# Patient Record
Sex: Male | Born: 1945
Health system: Southern US, Community
[De-identification: ages and names within clinical notes are randomized; demographics above are authoritative.]

## PROBLEM LIST (undated history)

## (undated) DIAGNOSIS — M1990 Unspecified osteoarthritis, unspecified site: Secondary | ICD-10-CM

## (undated) DIAGNOSIS — M79643 Pain in unspecified hand: Secondary | ICD-10-CM

## (undated) DIAGNOSIS — R319 Hematuria, unspecified: Secondary | ICD-10-CM

## (undated) DIAGNOSIS — F32A Depression, unspecified: Secondary | ICD-10-CM

## (undated) DIAGNOSIS — I7121 Aneurysm of the ascending aorta, without rupture: Secondary | ICD-10-CM

## (undated) DIAGNOSIS — R000 Tachycardia, unspecified: Secondary | ICD-10-CM

## (undated) DIAGNOSIS — I499 Cardiac arrhythmia, unspecified: Secondary | ICD-10-CM

## (undated) DIAGNOSIS — G4730 Sleep apnea, unspecified: Secondary | ICD-10-CM

## (undated) DIAGNOSIS — K219 Gastro-esophageal reflux disease without esophagitis: Secondary | ICD-10-CM

## (undated) DIAGNOSIS — T4145XA Adverse effect of unspecified anesthetic, initial encounter: Secondary | ICD-10-CM

## (undated) DIAGNOSIS — F329 Major depressive disorder, single episode, unspecified: Secondary | ICD-10-CM

## (undated) DIAGNOSIS — T8859XA Other complications of anesthesia, initial encounter: Secondary | ICD-10-CM

## (undated) DIAGNOSIS — I10 Essential (primary) hypertension: Secondary | ICD-10-CM

## (undated) DIAGNOSIS — I2510 Atherosclerotic heart disease of native coronary artery without angina pectoris: Secondary | ICD-10-CM

## (undated) DIAGNOSIS — N189 Chronic kidney disease, unspecified: Secondary | ICD-10-CM

## (undated) HISTORY — DX: Unspecified osteoarthritis, unspecified site: M19.90

## (undated) HISTORY — PX: REPLACEMENT TOTAL KNEE: SUR1224

## (undated) HISTORY — DX: Major depressive disorder, single episode, unspecified: F32.9

## (undated) HISTORY — PX: JOINT REPLACEMENT: SHX530

## (undated) HISTORY — PX: CATARACT EXTRACTION, BILATERAL: SHX1313

## (undated) HISTORY — DX: Chronic kidney disease, unspecified: N18.9

## (undated) HISTORY — DX: Essential (primary) hypertension: I10

## (undated) HISTORY — DX: Depression, unspecified: F32.A

## (undated) HISTORY — DX: Hematuria, unspecified: R31.9

---

## 2009-09-27 HISTORY — PX: REPLACEMENT TOTAL KNEE: SUR1224

## 2010-05-13 DIAGNOSIS — N189 Chronic kidney disease, unspecified: Secondary | ICD-10-CM | POA: Insufficient documentation

## 2010-09-27 HISTORY — PX: HIP SURGERY: SHX245

## 2011-03-01 DIAGNOSIS — M1710 Unilateral primary osteoarthritis, unspecified knee: Secondary | ICD-10-CM | POA: Insufficient documentation

## 2011-03-01 DIAGNOSIS — Z96649 Presence of unspecified artificial hip joint: Secondary | ICD-10-CM | POA: Insufficient documentation

## 2011-10-05 DIAGNOSIS — Z96659 Presence of unspecified artificial knee joint: Secondary | ICD-10-CM | POA: Insufficient documentation

## 2012-11-14 DIAGNOSIS — Z96659 Presence of unspecified artificial knee joint: Secondary | ICD-10-CM | POA: Insufficient documentation

## 2013-10-13 DIAGNOSIS — S61209A Unspecified open wound of unspecified finger without damage to nail, initial encounter: Secondary | ICD-10-CM | POA: Diagnosis not present

## 2013-12-07 DIAGNOSIS — M25519 Pain in unspecified shoulder: Secondary | ICD-10-CM | POA: Diagnosis not present

## 2014-01-10 DIAGNOSIS — M75101 Unspecified rotator cuff tear or rupture of right shoulder, not specified as traumatic: Secondary | ICD-10-CM

## 2014-01-10 DIAGNOSIS — M19019 Primary osteoarthritis, unspecified shoulder: Secondary | ICD-10-CM | POA: Diagnosis not present

## 2014-01-10 DIAGNOSIS — M249 Joint derangement, unspecified: Secondary | ICD-10-CM | POA: Diagnosis not present

## 2014-01-10 DIAGNOSIS — M12811 Other specific arthropathies, not elsewhere classified, right shoulder: Secondary | ICD-10-CM | POA: Insufficient documentation

## 2014-02-26 DIAGNOSIS — L821 Other seborrheic keratosis: Secondary | ICD-10-CM | POA: Insufficient documentation

## 2014-02-26 DIAGNOSIS — L738 Other specified follicular disorders: Secondary | ICD-10-CM | POA: Diagnosis not present

## 2014-03-08 DIAGNOSIS — N189 Chronic kidney disease, unspecified: Secondary | ICD-10-CM | POA: Diagnosis not present

## 2014-03-08 DIAGNOSIS — M109 Gout, unspecified: Secondary | ICD-10-CM | POA: Diagnosis not present

## 2014-04-08 DIAGNOSIS — B078 Other viral warts: Secondary | ICD-10-CM | POA: Diagnosis not present

## 2014-04-08 DIAGNOSIS — B079 Viral wart, unspecified: Secondary | ICD-10-CM | POA: Diagnosis not present

## 2014-04-08 DIAGNOSIS — D492 Neoplasm of unspecified behavior of bone, soft tissue, and skin: Secondary | ICD-10-CM | POA: Diagnosis not present

## 2014-04-08 DIAGNOSIS — Z85828 Personal history of other malignant neoplasm of skin: Secondary | ICD-10-CM | POA: Diagnosis not present

## 2014-05-01 DIAGNOSIS — H52229 Regular astigmatism, unspecified eye: Secondary | ICD-10-CM | POA: Diagnosis not present

## 2014-05-01 DIAGNOSIS — H2510 Age-related nuclear cataract, unspecified eye: Secondary | ICD-10-CM | POA: Diagnosis not present

## 2014-05-01 DIAGNOSIS — H5200 Hypermetropia, unspecified eye: Secondary | ICD-10-CM | POA: Diagnosis not present

## 2014-05-01 DIAGNOSIS — H524 Presbyopia: Secondary | ICD-10-CM | POA: Diagnosis not present

## 2014-05-09 DIAGNOSIS — M1710 Unilateral primary osteoarthritis, unspecified knee: Secondary | ICD-10-CM | POA: Diagnosis not present

## 2014-05-09 DIAGNOSIS — Z96659 Presence of unspecified artificial knee joint: Secondary | ICD-10-CM | POA: Diagnosis not present

## 2014-05-09 DIAGNOSIS — Z96649 Presence of unspecified artificial hip joint: Secondary | ICD-10-CM | POA: Diagnosis not present

## 2014-05-09 DIAGNOSIS — M25569 Pain in unspecified knee: Secondary | ICD-10-CM | POA: Diagnosis not present

## 2014-06-11 DIAGNOSIS — Z23 Encounter for immunization: Secondary | ICD-10-CM | POA: Diagnosis not present

## 2014-06-11 DIAGNOSIS — M25569 Pain in unspecified knee: Secondary | ICD-10-CM | POA: Diagnosis not present

## 2014-06-11 DIAGNOSIS — M25519 Pain in unspecified shoulder: Secondary | ICD-10-CM | POA: Diagnosis not present

## 2014-06-27 DIAGNOSIS — Z87898 Personal history of other specified conditions: Secondary | ICD-10-CM | POA: Diagnosis not present

## 2014-07-29 DIAGNOSIS — Z87898 Personal history of other specified conditions: Secondary | ICD-10-CM | POA: Diagnosis not present

## 2014-08-27 DIAGNOSIS — Z87898 Personal history of other specified conditions: Secondary | ICD-10-CM | POA: Diagnosis not present

## 2014-09-27 DIAGNOSIS — Z87898 Personal history of other specified conditions: Secondary | ICD-10-CM | POA: Diagnosis not present

## 2014-10-28 DIAGNOSIS — Z87898 Personal history of other specified conditions: Secondary | ICD-10-CM | POA: Diagnosis not present

## 2014-11-26 DIAGNOSIS — Z87898 Personal history of other specified conditions: Secondary | ICD-10-CM | POA: Diagnosis not present

## 2014-12-27 DIAGNOSIS — Z87898 Personal history of other specified conditions: Secondary | ICD-10-CM | POA: Diagnosis not present

## 2015-01-26 DIAGNOSIS — Z87898 Personal history of other specified conditions: Secondary | ICD-10-CM | POA: Diagnosis not present

## 2015-02-03 ENCOUNTER — Encounter: Payer: Self-pay | Admitting: Family Medicine

## 2015-02-03 ENCOUNTER — Ambulatory Visit (INDEPENDENT_AMBULATORY_CARE_PROVIDER_SITE_OTHER): Payer: Medicare Other | Admitting: Family Medicine

## 2015-02-03 VITALS — BP 104/65 | HR 73 | Temp 98.4°F | Resp 16 | Wt 218.0 lb

## 2015-02-03 DIAGNOSIS — Z125 Encounter for screening for malignant neoplasm of prostate: Secondary | ICD-10-CM

## 2015-02-03 DIAGNOSIS — H8110 Benign paroxysmal vertigo, unspecified ear: Secondary | ICD-10-CM | POA: Diagnosis not present

## 2015-02-03 DIAGNOSIS — R7989 Other specified abnormal findings of blood chemistry: Secondary | ICD-10-CM

## 2015-02-03 DIAGNOSIS — D224 Melanocytic nevi of scalp and neck: Secondary | ICD-10-CM

## 2015-02-03 DIAGNOSIS — F329 Major depressive disorder, single episode, unspecified: Secondary | ICD-10-CM

## 2015-02-03 DIAGNOSIS — I10 Essential (primary) hypertension: Secondary | ICD-10-CM

## 2015-02-03 DIAGNOSIS — R748 Abnormal levels of other serum enzymes: Secondary | ICD-10-CM

## 2015-02-03 DIAGNOSIS — F32A Depression, unspecified: Secondary | ICD-10-CM

## 2015-02-03 LAB — BASIC METABOLIC PANEL WITH GFR
BUN: 28 mg/dL — ABNORMAL HIGH (ref 6–23)
CALCIUM: 9.8 mg/dL (ref 8.4–10.5)
CO2: 22 meq/L (ref 19–32)
Chloride: 108 mEq/L (ref 96–112)
Creat: 1.41 mg/dL — ABNORMAL HIGH (ref 0.50–1.35)
GFR, EST AFRICAN AMERICAN: 59 mL/min — AB
GFR, EST NON AFRICAN AMERICAN: 51 mL/min — AB
Glucose, Bld: 80 mg/dL (ref 70–99)
Potassium: 4.4 mEq/L (ref 3.5–5.3)
SODIUM: 139 meq/L (ref 135–145)

## 2015-02-03 MED ORDER — MECLIZINE HCL 25 MG PO TABS: 25.0000 mg | ORAL_TABLET | Freq: Three times a day (TID) | ORAL | Status: DC | PRN

## 2015-02-03 MED ORDER — VALSARTAN 160 MG PO TABS: 160.0000 mg | ORAL_TABLET | Freq: Every day | ORAL | Status: DC

## 2015-02-03 MED ORDER — BUPROPION HCL ER (SR) 200 MG PO TB12: 200.0000 mg | ORAL_TABLET | Freq: Two times a day (BID) | ORAL | Status: DC

## 2015-02-03 NOTE — Progress Notes (Signed)
Subjective:  This chart was scribed for Joseph Ray, MD by [facility], medical scribe at [facility].The patient was seen in exam room 24 and the patient's care was started at 3:46 PM.   Patient ID: Joseph Maldonado, male    DOB: 1946/01/26, 69 y.o.   MRN: [id number] Chief Complaint  Patient presents with  . Establish Care  . elevated creatine    thinks it might be caused by long term use of nsaids  . Hypertension  . Dizziness    positional vertigo  . Referral    dermatologist  . blood work    check psa level   HPI HPI Comments: [name] is a 69 y.o. male who presents to [facility] to establish care. He is a new patient to me and the office. He has a history of hypertension. Pt is originally from [place] and moved here in October 2015. Retired in January 2015, daughter lives in [place]. Pt is not fasting today.  Prostate cancer screening: Pt would like to check PSA levels. No history of prostate cancer or benign prostate hypertrophy. No family history of prostate cancer.   Elevated Creatine:  Pt believes this is due to long term use of NSAID's. He is unsure what his last creatine level was but he says it is marginally high but steady. He was taking Celebrex 200 mg daily for quite some time, before that he was taking prescription level motrin. Normal urination. He denies  nausea and vomiting  Hypertension: Pt takes valsartan 160 mg daily. He needs his Valsartan refilled.    Dizziness: Pt states this is a recurrent strictly positional vertigo. If he lays down and turns his head to the right he becomes dizzy. The episodes typically last for a few seconds. He denies chest pain, heart palpations, heart disease, vision changes, headaches, slurred speech, weakness in his upper or lower extremities   Referral: Pt would like a referral to a dermatologist for a check up. He has had history of abnormal nevous. No specific area of concerns  currently though.  Depression: He takes Wellbutrin 200 mg twice daily, for about 8 years since his mother died. Depression is fine. No suicidal ideation He has four beers a week. He needs his Wellbutrin refilled.  There are no active problems to display for this patient.  Past Medical History  Diagnosis Date  . Arthritis   . Depression   . Chronic kidney disease    Past Surgical History  Procedure Laterality Date  . Joint replacement     Allergies  Allergen Reactions  . Dilaudid [Hydromorphone Hcl]    Prior to Admission medications   Not on File   History   Social History  . Marital Status: Married    Spouse Name: N/A  . Number of Children: N/A  . Years of Education: N/A   Occupational History  . Not on file.   Social History Main Topics  . Smoking status: Former Research scientist (life sciences)  . Smokeless tobacco: Not on file  . Alcohol Use: 1.8 oz/week    0 Standard drinks or equivalent, 3 Glasses of wine per week  . Drug Use: No  . Sexual Activity: Not on file   Other Topics Concern  . Not on file   Social History Narrative  . No narrative on file   Review of Systems  Constitutional: Negative for fatigue and unexpected weight change.  Eyes: Negative for visual disturbance.  Respiratory: Negative for  cough, chest tightness and shortness of breath.   Cardiovascular: Negative for chest pain, palpitations and leg swelling.  Gastrointestinal: Negative for nausea, vomiting, abdominal pain and blood in stool.  Neurological: Positive for dizziness. Negative for speech difficulty, weakness, light-headedness and headaches.  Psychiatric/Behavioral: Negative for suicidal ideas.      Objective:  BP 104/65 mmHg  Pulse 73  Temp(Src) 98.4 F (36.9 C) (Oral)  Resp 16  Wt 218 lb (98.884 kg)  SpO2 97% Physical Exam  Constitutional: He is oriented to person, place, and time. He appears well-developed and well-nourished. No distress.  HENT:  Head: Normocephalic and atraumatic.  Eyes: EOM  are normal. Pupils are equal, round, and reactive to light.  No horizontal or vertical nystagmus.  Neck: Normal range of motion.  Cardiovascular: Normal rate and regular rhythm.   Pulmonary/Chest: Effort normal. No respiratory distress.  Genitourinary:  Prostate: Non tender, no apparent nodules.  Musculoskeletal: Normal range of motion.  Neurological: He is alert and oriented to person, place, and time.  Negative Romberg's test Negative Pronator's drift Normal heel to toe walk  Skin: Skin is warm and dry.  Psychiatric: He has a normal mood and affect. His behavior is normal.  Nursing note and vitals reviewed.     Assessment & Plan:   [name] is a 69 y.o. male Essential hypertension - Plan: valsartan (DIOVAN) 160 MG tablet, BASIC METABOLIC PANEL WITH GFR  -borderline low in office - may be able to decrease Diovan to 80mg  QD. Can try 1/2 dose at home and monitor BP's.   Depression - Plan: buPROPion (WELLBUTRIN SR) 200 MG 12 hr tablet  -stable on current dose of wellbutrin.  Discussed trial of lower dose, and SED.  He declined change at this time.   Elevated serum creatinine - Plan: BASIC METABOLIC PANEL WITH GFR  -avoid NSAIDS, and as above - maintain normal BP (not too low or high).   Screening for prostate cancer - Plan: PSA, Medicare  -We discussed pros and cons of prostate cancer screening, and after this discussion, he chose to have screening done. PSA obtained, and no concerning findings on DRE.   Nevus of scalp - Plan: Ambulatory referral to Dermatology  -routine eval/ establish care for skin check/mole check.   Benign paroxysmal positional vertigo, unspecified laterality - Plan: meclizine (ANTIVERT) 25 MG tablet  -recurrent vertigo by report without palpitations or focal neurologic symptoms.   -meclizine prescribed, but if dizziness changes or recurrent - RTC for further eval.    Meds ordered this encounter  Medications  . DISCONTD: valsartan (DIOVAN) 160 MG  tablet    Sig: Take 160 mg by mouth daily.  Marland Kitchen DISCONTD: buPROPion (WELLBUTRIN SR) 200 MG 12 hr tablet    Sig: Take 200 mg by mouth 2 (two) times daily.  Marland Kitchen acetaminophen (TYLENOL) 500 MG tablet    Sig: Take 500 mg by mouth every 6 (six) hours as needed.  Marland Kitchen buPROPion (WELLBUTRIN SR) 200 MG 12 hr tablet    Sig: Take 1 tablet (200 mg total) by mouth 2 (two) times daily.    Dispense:  180 tablet    Refill:  1  . valsartan (DIOVAN) 160 MG tablet    Sig: Take 1 tablet (160 mg total) by mouth daily.    Dispense:  90 tablet    Refill:  1  . meclizine (ANTIVERT) 25 MG tablet    Sig: Take 1 tablet (25 mg total) by mouth 3 (three) times daily as needed for  dizziness.    Dispense:  30 tablet    Refill:  1   Patient Instructions  Keep a record of your blood pressures outside of the office and bring them to the next office visit. If it continue to run under 099 on systolic reading - take 1/2 tablet of Diovan.  Continue measuring blood pressures if you do end up using 1/2 of Diovan.   You should receive a call or letter about your lab results within the next week to 10 days.   I will refer you to dermatologist to establish care and exam.   Plan on wellness exam (physical) in next 6 months.   If dizziness increases or changes - return here or emergency room  Return to the clinic or go to the nearest emergency room if any of your symptoms worsen or new symptoms occur.  Benign Positional Vertigo Vertigo means you feel like you or your surroundings are moving when they are not. Benign positional vertigo is the most common form of vertigo. Benign means that the cause of your condition is not serious. Benign positional vertigo is more common in older adults. CAUSES  Benign positional vertigo is the result of an upset in the labyrinth system. This is an area in the middle ear that helps control your balance. This may be caused by a viral infection, head injury, or repetitive motion. However, often no  specific cause is found. SYMPTOMS  Symptoms of benign positional vertigo occur when you move your head or eyes in different directions. Some of the symptoms may include:  Loss of balance and falls.  Vomiting.  Blurred vision.  Dizziness.  Nausea.  Involuntary eye movements (nystagmus). DIAGNOSIS  Benign positional vertigo is usually diagnosed by physical exam. If the specific cause of your benign positional vertigo is unknown, your caregiver may perform imaging tests, such as magnetic resonance imaging (MRI) or computed tomography (CT). TREATMENT  Your caregiver may recommend movements or procedures to correct the benign positional vertigo. Medicines such as meclizine, benzodiazepines, and medicines for nausea may be used to treat your symptoms. In rare cases, if your symptoms are caused by certain conditions that affect the inner ear, you may need surgery. HOME CARE INSTRUCTIONS   Follow your caregiver's instructions.  Move slowly. Do not make sudden body or head movements.  Avoid driving.  Avoid operating heavy machinery.  Avoid performing any tasks that would be dangerous to you or others during a vertigo episode.  Drink enough fluids to keep your urine clear or pale yellow. SEEK IMMEDIATE MEDICAL CARE IF:   You develop problems with walking, weakness, numbness, or using your arms, hands, or legs.  You have difficulty speaking.  You develop severe headaches.  Your nausea or vomiting continues or gets worse.  You develop visual changes.  Your family or friends notice any behavioral changes.  Your condition gets worse.  You have a fever.  You develop a stiff neck or sensitivity to light. MAKE SURE YOU:   Understand these instructions.  Will watch your condition.  Will get help right away if you are not doing well or get worse. Document Released: 06/21/2006 Document Revised: 12/06/2011 Document Reviewed: 06/03/2011 [facility] Patient Information 2015  [place], [place]. This information is not intended to replace advice given to you by your health care provider. Make sure you discuss any questions you have with your health care provider.       I personally performed the services described in this documentation, which was scribed  in my presence. The recorded information has been reviewed and considered, and addended by me as needed.

## 2015-02-03 NOTE — Patient Instructions (Addendum)
Keep a record of your blood pressures outside of the office and bring them to the next office visit. If it continue to run under 939 on systolic reading - take 1/2 tablet of Diovan.  Continue measuring blood pressures if you do end up using 1/2 of Diovan.   You should receive a call or letter about your lab results within the next week to 10 days.   I will refer you to dermatologist to establish care and exam.   Plan on wellness exam (physical) in next 6 months.   If dizziness increases or changes - return here or emergency room  Return to the clinic or go to the nearest emergency room if any of your symptoms worsen or new symptoms occur.  Benign Positional Vertigo Vertigo means you feel like you or your surroundings are moving when they are not. Benign positional vertigo is the most common form of vertigo. Benign means that the cause of your condition is not serious. Benign positional vertigo is more common in older adults. CAUSES  Benign positional vertigo is the result of an upset in the labyrinth system. This is an area in the middle ear that helps control your balance. This may be caused by a viral infection, head injury, or repetitive motion. However, often no specific cause is found. SYMPTOMS  Symptoms of benign positional vertigo occur when you move your head or eyes in different directions. Some of the symptoms may include:  Loss of balance and falls.  Vomiting.  Blurred vision.  Dizziness.  Nausea.  Involuntary eye movements (nystagmus). DIAGNOSIS  Benign positional vertigo is usually diagnosed by physical exam. If the specific cause of your benign positional vertigo is unknown, your caregiver may perform imaging tests, such as magnetic resonance imaging (MRI) or computed tomography (CT). TREATMENT  Your caregiver may recommend movements or procedures to correct the benign positional vertigo. Medicines such as meclizine, benzodiazepines, and medicines for nausea may be used  to treat your symptoms. In rare cases, if your symptoms are caused by certain conditions that affect the inner ear, you may need surgery. HOME CARE INSTRUCTIONS   Follow your caregiver's instructions.  Move slowly. Do not make sudden body or head movements.  Avoid driving.  Avoid operating heavy machinery.  Avoid performing any tasks that would be dangerous to you or others during a vertigo episode.  Drink enough fluids to keep your urine clear or pale yellow. SEEK IMMEDIATE MEDICAL CARE IF:   You develop problems with walking, weakness, numbness, or using your arms, hands, or legs.  You have difficulty speaking.  You develop severe headaches.  Your nausea or vomiting continues or gets worse.  You develop visual changes.  Your family or friends notice any behavioral changes.  Your condition gets worse.  You have a fever.  You develop a stiff neck or sensitivity to light. MAKE SURE YOU:   Understand these instructions.  Will watch your condition.  Will get help right away if you are not doing well or get worse. Document Released: 06/21/2006 Document Revised: 12/06/2011 Document Reviewed: 06/03/2011 [facility] Patient Information 2015 [place], [place]. This information is not intended to replace advice given to you by your health care provider. Make sure you discuss any questions you have with your health care provider.

## 2015-02-04 LAB — PSA, MEDICARE: PSA: 1.57 ng/mL (ref <=4.00)

## 2015-02-09 ENCOUNTER — Encounter (HOSPITAL_COMMUNITY): Payer: Self-pay | Admitting: Emergency Medicine

## 2015-02-09 ENCOUNTER — Emergency Department (INDEPENDENT_AMBULATORY_CARE_PROVIDER_SITE_OTHER)
Admission: EM | Admit: 2015-02-09 | Discharge: 2015-02-09 | Disposition: A | Discharge status: Home / Self Care | Payer: Medicare Other | Source: Home / Self Care | Attending: Emergency Medicine | Admitting: Emergency Medicine

## 2015-02-09 DIAGNOSIS — T148XXA Other injury of unspecified body region, initial encounter: Secondary | ICD-10-CM

## 2015-02-09 DIAGNOSIS — T148 Other injury of unspecified body region: Secondary | ICD-10-CM

## 2015-02-09 DIAGNOSIS — Z23 Encounter for immunization: Secondary | ICD-10-CM | POA: Diagnosis not present

## 2015-02-09 MED ORDER — TETANUS-DIPHTH-ACELL PERTUSSIS 5-2.5-18.5 LF-MCG/0.5 IM SUSP
INTRAMUSCULAR | Status: AC
  Filled 2015-02-09: qty 0.5

## 2015-02-09 MED ORDER — AMOXICILLIN-POT CLAVULANATE 875-125 MG PO TABS: 1.0000 | ORAL_TABLET | Freq: Two times a day (BID) | ORAL | Status: DC

## 2015-02-09 MED ORDER — TETANUS-DIPHTH-ACELL PERTUSSIS 5-2.5-18.5 LF-MCG/0.5 IM SUSP
0.5000 mL | Freq: Once | INTRAMUSCULAR | Status: AC
  Administered 2015-02-09: 0.5 mL via INTRAMUSCULAR

## 2015-02-09 NOTE — Discharge Instructions (Signed)
I removed a small piece of the splinter. Please take Augmentin twice a day for the next 10 days. Wash the finger with soap and water twice a day. Keep it covered with a bandage during the day. Follow-up as needed.

## 2015-02-09 NOTE — ED Notes (Signed)
Patient c/o right index finger wooden splinter. Patient reports finger has become swollen and tender to the touch. Patient reports he thinks he is due for a tetanus. Patient is in NAD.

## 2015-02-09 NOTE — ED Provider Notes (Signed)
CSN: [id number]     Arrival date & time 02/09/15  1627 History   First MD Initiated Contact with Patient 02/09/15 1652     Chief Complaint  Patient presents with  . Finger Injury   (Consider location/radiation/quality/duration/timing/severity/associated sxs/prior Treatment) HPI  He is a 69 year old man here for evaluation of right index finger injury. He states several weeks ago he was buying lumber at [place] when he got a half inch splinter in the right index finger. He got most of it out, but is concerned there is some retained wood. He has had pain and swelling in that area since then. He has tried to take out remaining splinter several times. Today, he was pressing on it and expressed some pus. He denies any fevers or chills. He has had multiple joint replacements.  Past Medical History  Diagnosis Date  . Arthritis   . Depression   . Chronic kidney disease    Past Surgical History  Procedure Laterality Date  . Joint replacement     Family History  Problem Relation Age of Onset  . Cancer Mother   . Hypertension Mother   . Cancer Father   . Hypertension Father   . Hypertension Sister    History  Substance Use Topics  . Smoking status: Former Research scientist (life sciences)  . Smokeless tobacco: Not on file  . Alcohol Use: 1.8 oz/week    0 Standard drinks or equivalent, 3 Glasses of wine per week    Review of Systems As in history of present illness Allergies  Dilaudid  Home Medications   Prior to Admission medications   Medication Sig Start Date End Date Taking? Authorizing Provider  acetaminophen (TYLENOL) 500 MG tablet Take 500 mg by mouth every 6 (six) hours as needed.    Historical Provider, MD  amoxicillin-clavulanate (AUGMENTIN) 875-125 MG per tablet Take 1 tablet by mouth 2 (two) times daily. 02/09/15   Melony Overly, MD  buPROPion (WELLBUTRIN SR) 200 MG 12 hr tablet Take 1 tablet (200 mg total) by mouth 2 (two) times daily. 02/03/15   Wendie Agreste, MD  meclizine (ANTIVERT) 25 MG  tablet Take 1 tablet (25 mg total) by mouth 3 (three) times daily as needed for dizziness. 02/03/15   Wendie Agreste, MD  valsartan (DIOVAN) 160 MG tablet Take 1 tablet (160 mg total) by mouth daily. 02/03/15   Wendie Agreste, MD   BP 132/84 mmHg  Pulse 70  Temp(Src) 97.4 F (36.3 C) (Oral)  Resp 17  SpO2 96% Physical Exam  Constitutional: He is oriented to person, place, and time. He appears well-developed and well-nourished. No distress.  Cardiovascular: Normal rate.   Pulmonary/Chest: Effort normal.  Musculoskeletal:  Right index finger: He has a 5 mm area of swelling and erythema on the plantar aspect of the intermediate phalanx. I am able to express some clear fluid.  Neurological: He is alert and oriented to person, place, and time.    ED Course  FOREIGN BODY REMOVAL Date/Time: 02/09/2015 5:24 PM Performed by: Melony Overly Authorized by: Melony Overly Risks and benefits: risks, benefits and alternatives were discussed Consent given by: patient Patient understanding: patient states understanding of the procedure being performed Patient identity confirmed: verbally with patient Time out: Immediately prior to procedure a "time out" was called to verify the correct patient, procedure, equipment, support staff and site/side marked as required. Body area: skin General location: upper extremity Location details: right index finger Anesthesia: digital block Local anesthetic: lidocaine  2% without epinephrine Anesthetic total: 1 ml Localization method: probed Removal mechanism: scalpel Dressing: antibiotic ointment and dressing applied Tendon involvement: none Depth: subcutaneous Complexity: simple 1 objects recovered. Objects recovered: tiny sliver Post-procedure assessment: foreign body removed Patient tolerance: Patient tolerated the procedure well with no immediate complications   (including critical care time) Labs Review Labs Reviewed - No data to display  Imaging  Review No results found.   MDM   1. Splinter in skin    TDaP given. Given that there was a small amount of pus, will treat with Augmentin. Follow-up as needed.   Melony Overly, MD 02/09/15 1726

## 2015-02-25 DIAGNOSIS — Z85828 Personal history of other malignant neoplasm of skin: Secondary | ICD-10-CM | POA: Diagnosis not present

## 2015-02-25 DIAGNOSIS — L570 Actinic keratosis: Secondary | ICD-10-CM | POA: Diagnosis not present

## 2015-02-25 DIAGNOSIS — D225 Melanocytic nevi of trunk: Secondary | ICD-10-CM | POA: Diagnosis not present

## 2015-02-25 DIAGNOSIS — L739 Follicular disorder, unspecified: Secondary | ICD-10-CM | POA: Diagnosis not present

## 2015-02-25 DIAGNOSIS — L821 Other seborrheic keratosis: Secondary | ICD-10-CM | POA: Diagnosis not present

## 2015-02-26 DIAGNOSIS — Z87898 Personal history of other specified conditions: Secondary | ICD-10-CM | POA: Diagnosis not present

## 2015-04-01 ENCOUNTER — Encounter: Payer: Self-pay | Admitting: Family Medicine

## 2015-04-21 ENCOUNTER — Encounter: Payer: Self-pay | Admitting: Family Medicine

## 2015-04-21 DIAGNOSIS — Z96653 Presence of artificial knee joint, bilateral: Secondary | ICD-10-CM

## 2015-05-27 DIAGNOSIS — Z09 Encounter for follow-up examination after completed treatment for conditions other than malignant neoplasm: Secondary | ICD-10-CM | POA: Diagnosis not present

## 2015-05-27 DIAGNOSIS — Z96651 Presence of right artificial knee joint: Secondary | ICD-10-CM | POA: Diagnosis not present

## 2015-05-27 DIAGNOSIS — Z96652 Presence of left artificial knee joint: Secondary | ICD-10-CM | POA: Diagnosis not present

## 2015-08-04 ENCOUNTER — Other Ambulatory Visit: Payer: Self-pay | Admitting: Family Medicine

## 2015-09-01 DIAGNOSIS — D1801 Hemangioma of skin and subcutaneous tissue: Secondary | ICD-10-CM | POA: Diagnosis not present

## 2015-09-01 DIAGNOSIS — L821 Other seborrheic keratosis: Secondary | ICD-10-CM | POA: Diagnosis not present

## 2015-09-01 DIAGNOSIS — D485 Neoplasm of uncertain behavior of skin: Secondary | ICD-10-CM | POA: Diagnosis not present

## 2015-09-01 DIAGNOSIS — Z85828 Personal history of other malignant neoplasm of skin: Secondary | ICD-10-CM | POA: Diagnosis not present

## 2015-09-01 DIAGNOSIS — S80211S Abrasion, right knee, sequela: Secondary | ICD-10-CM | POA: Diagnosis not present

## 2015-09-01 DIAGNOSIS — L739 Follicular disorder, unspecified: Secondary | ICD-10-CM | POA: Diagnosis not present

## 2015-09-27 ENCOUNTER — Other Ambulatory Visit: Payer: Self-pay | Admitting: Family Medicine

## 2015-10-01 DIAGNOSIS — Z23 Encounter for immunization: Secondary | ICD-10-CM | POA: Diagnosis not present

## 2015-10-10 ENCOUNTER — Ambulatory Visit (INDEPENDENT_AMBULATORY_CARE_PROVIDER_SITE_OTHER): Payer: Medicare Other | Admitting: Physician Assistant

## 2015-10-10 ENCOUNTER — Ambulatory Visit (INDEPENDENT_AMBULATORY_CARE_PROVIDER_SITE_OTHER): Payer: Medicare Other

## 2015-10-10 VITALS — BP 128/72 | HR 84 | Temp 98.2°F | Resp 18 | Ht 70.5 in | Wt 220.0 lb

## 2015-10-10 DIAGNOSIS — M25521 Pain in right elbow: Secondary | ICD-10-CM

## 2015-10-10 MED ORDER — MELOXICAM 15 MG PO TABS: 15.0000 mg | ORAL_TABLET | Freq: Every day | ORAL | Status: DC

## 2015-10-10 NOTE — Progress Notes (Signed)
Patient ID: Joseph Maldonado, male    DOB: 1946/04/07, 70 y.o.   MRN: [id number]  PCP: Wendie Agreste, MD  Subjective:   Chief Complaint  Patient presents with  . Elbow Injury    right, since yesterday    HPI Presents for evaluation of right elbow pain since yesterday. 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 He came in today because the pain is worse than usual, and has lasted longer.   He notes the past injury to the elbow occurred when he caught a falling piece of wood in his left arm 3 months ago. Since then he has experienced intermittent pain in the left elbow, rating it as a 4/10, on two occasions.   This time the pain started yesterday afternoon while the patient was unpacking a box of equipment. The pain occurs with movement and is an increasingly dull pain. He rates the pain as an 8/10 with movement. Noted decreased ROM. He has been unable to button his shirt, take his t-shirt off, and had difficulty drinking soup. He notes a lump on his right arm, wife states that has been there for several months. Patient has not tried to relieve the pain.   Denies fever, ecchymosis, swelling, erythema of the joint.   He has a hx of arthritis in his knees b/l, and fingers in left hand.  Additionally, he has a history of joint replacement, both knee's and left hip, and systemic gout. .    Review of Systems Constitutional: Negative for fever, chills and fatigue.  Respiratory: Negative for cough and shortness of breath.  Cardiovascular: Negative for chest pain.  Musculoskeletal: Positive for arthralgias. Negative for myalgias.  Skin: Negative.  Hematological: Does not bruise/bleed easily.     There are no active problems to display for this patient.    Prior to Admission medications   Medication Sig Start Date End Date Taking? Authorizing Provider  acetaminophen  (TYLENOL) 500 MG tablet Take 500 mg by mouth every 6 (six) hours as needed.   Yes Historical Provider, MD  buPROPion (WELLBUTRIN SR) 200 MG 12 hr tablet TAKE 1 TABLET TWICE A DAY.   "NO MORE REFILLS WITHOUT APPOINTMENT" 08/05/15  Yes Wendie Agreste, MD  valsartan (DIOVAN) 160 MG tablet TAKE 1 TABLET DAILY 09/28/15  Yes Wendie Agreste, MD     Allergies  Allergen Reactions  . Dilaudid [Hydromorphone Hcl]        Objective:  Physical Exam  Constitutional: He is oriented to person, place, and time. Vital signs are normal. He appears well-developed and well-nourished. He is active and cooperative. No distress.  BP 128/72 mmHg  Pulse 84  Temp(Src) 98.2 F (36.8 C)  Resp 18  Ht 5' 10.5" (1.791 m)  Wt 220 lb (99.791 kg)  BMI 31.11 kg/m2  SpO2 99%  HENT:  Head: Normocephalic and atraumatic.  Right Ear: Hearing normal.  Left Ear: Hearing normal.  Eyes: Conjunctivae are normal. No scleral icterus.  Neck: Normal range of motion. Neck supple. No thyromegaly present.  Cardiovascular: Normal rate, regular rhythm and normal heart sounds.   Pulses:      Radial pulses are 2+ on the right side, and 2+ on the left side.  Pulmonary/Chest: Effort normal and breath sounds normal.  Musculoskeletal:       Right shoulder: Normal.       Right elbow: He exhibits decreased range of motion (  range limited to 90 degress flexion-160 degrees extension) and deformity (the lateral epicondyle area is prominent, "full" appearing, which the patient states is baseline). He exhibits no swelling, no effusion and no laceration. Tenderness found. Lateral epicondyle tenderness noted. No radial head, no medial epicondyle and no olecranon process tenderness noted.       Right wrist: Normal.       Right upper arm: Normal.       Right forearm: Normal.       Right hand: Normal sensation noted. Decreased strength (due to pain with grip) noted.  Lymphadenopathy:       Head (right side): No tonsillar, no preauricular, no  posterior auricular and no occipital adenopathy present.       Head (left side): No tonsillar, no preauricular, no posterior auricular and no occipital adenopathy present.    He has no cervical adenopathy.       Right: No supraclavicular adenopathy present.       Left: No supraclavicular adenopathy present.  Neurological: He is alert and oriented to person, place, and time. No sensory deficit.  Reduced strength on the RIGHT compared to the left due to pain.  Skin: Skin is warm, dry and intact. No rash noted. No cyanosis or erythema. Nails show no clubbing.  Psychiatric: His speech is normal and behavior is normal. His mood appears anxious. His affect is not angry, not blunt, not labile and not inappropriate. He does not exhibit a depressed mood.       RIGHT Elbow: [facility] reading (PRIMARY) by  Dr. Lorelei Pont. Spur noted of the olecranon. Otherwise normal       Assessment & Plan:   1. Elbow pain, right Likely overuse/tendonitis. Trial of meloxicam (only briefly, due to elevated creatinine history) and OTC elbow strap. If symptoms worsen/persist, plan referral to orthopedics. - DG Elbow Complete Right; Future - meloxicam (MOBIC) 15 MG tablet; Take 1 tablet (15 mg total) by mouth daily.  Dispense: 10 tablet; Refill: 0   Fara Chute, PA-C Physician Assistant-Certified [facility]

## 2015-10-10 NOTE — Progress Notes (Signed)
Subjective:    Patient ID: Joseph Maldonado, male    DOB: 1945-10-07, 70 y.o.   MRN: [id number] Chief Complaint  Patient presents with  . Elbow Injury    right, since yesterday   HPI Patient presents to the office today for right elbow injury since yesterday. 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 The reason he came in today was because the pain has never been this painful or lasted this long. He notes the past injury to the elbow occurred when he caught a falling piece of wood in his left arm 3 months ago. Since then he has experienced intermittent pain in the left elbow, rating it as a 4/10, on two occasions.   He presents today because the pain lasted longer and is more painful than before.  The pain started yesterday afternoon while the patient was unpacking a box of equipment. The pain occurs with movement and is an increasingly dull pain. He rates the pain as an 8/10 with movement. Noted decreased ROM. He has been unable to button his shirt, take his t-shirt off, and had difficulty drinking soup. He notes a lump on his right arm, wife states that has been there for several months. Patient has not tried to relieve the pain. Denies fever, ecchymosis, swelling, erythema of the joint. Patient has a He has a hx of arthritis in his knees b/l, and fingers in left hand. Additionally, he has a history of joint replacement, both knee's and left hip, and systemic gout.   Review of Systems  Constitutional: Negative for fever, chills and fatigue.  Respiratory: Negative for cough and shortness of breath.   Cardiovascular: Negative for chest pain.  Musculoskeletal: Positive for arthralgias. Negative for myalgias.  Skin: Negative.   Hematological: Does not bruise/bleed easily.    Allergies  Allergen Reactions  . Dilaudid [Hydromorphone Hcl]    Prior to Admission medications     Medication Sig Start Date End Date Taking? Authorizing Provider  acetaminophen (TYLENOL) 500 MG tablet Take 500 mg by mouth every 6 (six) hours as needed.   Yes Historical Provider, MD  buPROPion (WELLBUTRIN SR) 200 MG 12 hr tablet TAKE 1 TABLET TWICE A DAY.   "NO MORE REFILLS WITHOUT APPOINTMENT" 08/05/15  Yes Wendie Agreste, MD  valsartan (DIOVAN) 160 MG tablet TAKE 1 TABLET DAILY 09/28/15  Yes Wendie Agreste, MD   There are no active problems to display for this patient.     Objective:   Physical Exam  Constitutional: Vital signs are normal. He appears well-developed and well-nourished.  BP 128/72 mmHg  Pulse 84  Temp(Src) 98.2 F (36.8 C)  Resp 18  Ht 5' 10.5" (1.791 m)  Wt 220 lb (99.791 kg)  BMI 31.11 kg/m2  SpO2 99%  HENT:  Head: Normocephalic and atraumatic.  Neck: Trachea normal. Neck supple.  Cardiovascular: Normal rate, regular rhythm and normal heart sounds.  Exam reveals no gallop and no friction rub.   No murmur heard. Pulmonary/Chest: Effort normal and breath sounds normal.  Musculoskeletal:       Right shoulder: Normal.       Left shoulder: Normal.       Right elbow: He exhibits decreased range of motion (Elbow flexion limited to 90 deg. Elbow extension limited to 160 degrees. ).       Left elbow: Normal.       Right  wrist: Normal.       Left wrist: Normal.       Arms: 5+ strength of shoulders b/l Negative special test for lateral and medial epicondylitis.  5+ strength of L elbow with flexion and extension. Decreased strength of R elbow on extension. 5+ strength of R elbow on flexion.  5+ strength of wrists b/l.   Lymphadenopathy:       Head (right side): No submental, no submandibular, no tonsillar, no preauricular, no posterior auricular and no occipital adenopathy present.       Head (left side): No submental, no submandibular, no tonsillar, no preauricular, no posterior auricular and no occipital adenopathy present.    He has no cervical adenopathy.   Neurological: He is alert.  Skin: Skin is warm and dry.  Psychiatric: He has a normal mood and affect. His behavior is normal.  Vitals reviewed.  RIGHT Elbow:  [facility] reading (PRIMARY) by Dr. Lorelei Pont. Spur noted of the olecranon. Otherwise normal      Assessment & Plan:  1. Elbow pain, right - DG Elbow Complete Right; Future - meloxicam (MOBIC) 15 MG tablet; Take 1 tablet (15 mg total) by mouth daily.  Dispense: 10 tablet; Refill: 0  Return if symptoms worsen or fail to improve.

## 2015-10-10 NOTE — Patient Instructions (Signed)
Try taking 1/2 tablet of the meloxicam each day.  The maximum dose is 15 mg total each day.  Consider an OTC elbow strap.  If your symptoms persist, we will plan to have you see an orthopedic specialist.

## 2015-11-02 ENCOUNTER — Other Ambulatory Visit: Payer: Self-pay | Admitting: Family Medicine

## 2015-11-25 ENCOUNTER — Ambulatory Visit (INDEPENDENT_AMBULATORY_CARE_PROVIDER_SITE_OTHER): Payer: Medicare Other

## 2015-11-25 ENCOUNTER — Encounter: Payer: Self-pay | Admitting: Podiatry

## 2015-11-25 ENCOUNTER — Ambulatory Visit (INDEPENDENT_AMBULATORY_CARE_PROVIDER_SITE_OTHER): Payer: Medicare Other | Admitting: Podiatry

## 2015-11-25 VITALS — BP 124/76 | HR 74 | Resp 12

## 2015-11-25 DIAGNOSIS — Q828 Other specified congenital malformations of skin: Secondary | ICD-10-CM

## 2015-11-25 DIAGNOSIS — L84 Corns and callosities: Secondary | ICD-10-CM

## 2015-11-25 DIAGNOSIS — M2040 Other hammer toe(s) (acquired), unspecified foot: Secondary | ICD-10-CM | POA: Diagnosis not present

## 2015-11-25 NOTE — Progress Notes (Signed)
   Subjective:    Patient ID: Eyvonne Mechanic, male    DOB: [birth year], 70 y.o.   MRN: [id number]  HPI: Mr. Blinn presents today with a chief complaint of a painful third digit of his right foot. He states that he has a callus on the end of the toe. He states that as he was having his multiple knee replacements to his right knee he developed contracture deformity of the toes which has now resulted in a distal clavus which is severely painful.    Review of Systems  HENT: Positive for hearing loss.   Neurological: Positive for dizziness.       Objective:   Physical Exam: Vital signs are stable he is alert and oriented 3. Pulses are palpable. Neurologic sensorium is intact. Deep tendon reflexes are intact and muscle strength is intact bilateral. He has severe rigid contracture deformities of toes 1 through 4 bilaterally right greater than left. Cutaneous evaluation does do a straight distal clavus with no open wounds third digit right foot. Radiographs confirm arthritic traction deformity of lesser digits bilateral.        Assessment & Plan:  Hammertoe deformities 2 through 4 bilateral with mallet toe deformities bilateral. Distal clavus third digit right foot. A 4 in nature.  Plan: Debrided the reactive hyperkeratotic lesion provided him with a silicone sleeve for the toe. We discussed proper shoe gear stretching exercises and sugar modifications. I also discussed surgical alternatives with him in great detail today he understands this and is amenable to it if necessary.

## 2015-12-17 ENCOUNTER — Ambulatory Visit (INDEPENDENT_AMBULATORY_CARE_PROVIDER_SITE_OTHER): Payer: Medicare Other | Admitting: Family Medicine

## 2015-12-17 ENCOUNTER — Encounter: Payer: Self-pay | Admitting: Family Medicine

## 2015-12-17 VITALS — BP 130/72 | HR 66 | Temp 98.0°F | Resp 16 | Ht 71.0 in | Wt 217.0 lb

## 2015-12-17 DIAGNOSIS — Z9181 History of falling: Secondary | ICD-10-CM | POA: Diagnosis not present

## 2015-12-17 DIAGNOSIS — M79645 Pain in left finger(s): Secondary | ICD-10-CM

## 2015-12-17 DIAGNOSIS — Z0000 Encounter for general adult medical examination without abnormal findings: Secondary | ICD-10-CM | POA: Diagnosis not present

## 2015-12-17 DIAGNOSIS — Z13220 Encounter for screening for lipoid disorders: Secondary | ICD-10-CM

## 2015-12-17 DIAGNOSIS — F329 Major depressive disorder, single episode, unspecified: Secondary | ICD-10-CM | POA: Diagnosis not present

## 2015-12-17 DIAGNOSIS — R3129 Other microscopic hematuria: Secondary | ICD-10-CM | POA: Diagnosis not present

## 2015-12-17 DIAGNOSIS — Z23 Encounter for immunization: Secondary | ICD-10-CM | POA: Diagnosis not present

## 2015-12-17 DIAGNOSIS — Z96653 Presence of artificial knee joint, bilateral: Secondary | ICD-10-CM | POA: Diagnosis not present

## 2015-12-17 DIAGNOSIS — R7989 Other specified abnormal findings of blood chemistry: Secondary | ICD-10-CM

## 2015-12-17 DIAGNOSIS — I10 Essential (primary) hypertension: Secondary | ICD-10-CM

## 2015-12-17 DIAGNOSIS — R748 Abnormal levels of other serum enzymes: Secondary | ICD-10-CM | POA: Diagnosis not present

## 2015-12-17 DIAGNOSIS — F32A Depression, unspecified: Secondary | ICD-10-CM

## 2015-12-17 DIAGNOSIS — R350 Frequency of micturition: Secondary | ICD-10-CM | POA: Diagnosis not present

## 2015-12-17 LAB — LIPID PANEL
CHOL/HDL RATIO: 3.8 ratio (ref <=5.0)
CHOLESTEROL: 208 mg/dL — AB (ref 125–200)
HDL: 55 mg/dL (ref >=40)
LDL Cholesterol: 130 mg/dL — ABNORMAL HIGH (ref <130)
Triglycerides: 115 mg/dL (ref <150)
VLDL: 23 mg/dL (ref <30)

## 2015-12-17 LAB — COMPLETE METABOLIC PANEL WITH GFR
ALBUMIN: 4.2 g/dL (ref 3.6–5.1)
ALK PHOS: 81 U/L (ref 40–115)
ALT: 15 U/L (ref 9–46)
AST: 16 U/L (ref 10–35)
BILIRUBIN TOTAL: 0.5 mg/dL (ref 0.2–1.2)
BUN: 24 mg/dL (ref 7–25)
CALCIUM: 9.3 mg/dL (ref 8.6–10.3)
CO2: 25 mmol/L (ref 20–31)
Chloride: 106 mmol/L (ref 98–110)
Creat: 1.47 mg/dL — ABNORMAL HIGH (ref 0.70–1.25)
GFR, EST NON AFRICAN AMERICAN: 48 mL/min — AB (ref >=60)
GFR, Est African American: 56 mL/min — ABNORMAL LOW (ref >=60)
Glucose, Bld: 94 mg/dL (ref 65–99)
Potassium: 4.4 mmol/L (ref 3.5–5.3)
Sodium: 139 mmol/L (ref 135–146)
TOTAL PROTEIN: 6.5 g/dL (ref 6.1–8.1)

## 2015-12-17 LAB — POCT URINALYSIS DIP (MANUAL ENTRY)
Bilirubin, UA: NEGATIVE
GLUCOSE UA: NEGATIVE
Ketones, POC UA: NEGATIVE
Leukocytes, UA: NEGATIVE
NITRITE UA: NEGATIVE
Protein Ur, POC: NEGATIVE
Spec Grav, UA: 1.02
UROBILINOGEN UA: 0.2
pH, UA: 6

## 2015-12-17 LAB — POC MICROSCOPIC URINALYSIS (UMFC): Mucus: ABSENT

## 2015-12-17 MED ORDER — BUPROPION HCL ER (SR) 200 MG PO TB12: ORAL_TABLET | ORAL | Status: DC

## 2015-12-17 MED ORDER — VALSARTAN 160 MG PO TABS: ORAL_TABLET | ORAL | Status: DC

## 2015-12-17 NOTE — Progress Notes (Signed)
   Subjective:    Patient ID: Joseph Maldonado, male    DOB: 1945-12-27, 70 y.o.   MRN: [id number]  HPI    Review of Systems  Constitutional: Negative.   HENT: Negative.   Eyes: Negative.   Respiratory: Negative.   Cardiovascular: Negative.   Gastrointestinal: Negative.   Endocrine: Negative.   Genitourinary: Positive for frequency.  Musculoskeletal: Positive for arthralgias and neck stiffness.  Skin: Negative.   Allergic/Immunologic: Negative.   Neurological: Positive for dizziness.  Hematological: Negative.   Psychiatric/Behavioral: Negative.        Objective:   Physical Exam        Assessment & Plan:

## 2015-12-17 NOTE — Progress Notes (Addendum)
Subjective:  By signing my name below, I, [name], attest that this documentation has been prepared under the direction and in the presence of [name], MD. Electronically Signed: [name], [place]. 12/17/2015 , 11:13 AM .  Patient was seen in Room 22 .   Patient ID: Joseph Maldonado, male    DOB: 03-05-1946, 70 y.o.   MRN: [id number] Chief Complaint  Patient presents with  . Annual Exam  . Medication Refill   HPI Joseph Maldonado is a 70 y.o. male Here for annual physical and medication refill.  Last appointment with me was May 2016; h/o elevated creatinine, HTN, and depression.  New patient to me at that time. Referred to dermatology for ongoing care and screening. Prostate cancer screening done. Advised to avoid NSAID's for elevated creatinine, and continued on wellbutrin for depression, and valsartan for HTN.   He's generally feeling well. He's fast today.   Left thumb pain He mentions slipping on concrete floor in his garage 2 winters ago. Every now and then, his left thumb pain would act up. He's been taking tylenol when the pain occurs.   Dizziness He takes meclizine 25mg  tid as needed for relief. He denies weakness, slurred speech or vision changes.   Cancer Screening Colonoscopy: last one done in 2015, found some diverticulitis but was informed that he didn't need any more.  Prostate:  Lab Results  Component Value Date   PSA 1.57 02/03/2015   Immunizations Immunization History  Administered Date(s) Administered  . Influenza-Unspecified 10/01/2015  . Pneumococcal Conjugate-13 04/10/2014  . Pneumococcal-Unspecified 09/27/2010  . Tdap 02/09/2015  . Zoster 09/27/2010   Appears to be UTD except pneumovax was prior to 66, which can be done today.   Depression Depression screen [facility] 2/9 12/17/2015 10/10/2015 02/03/2015  Decreased Interest 0 0 0  Down, Depressed, Hopeless 0 0 0  PHQ - 2 Score 0 0 0   He's still taking wellbutrin 200mg  bid. He has hobbies  that he enjoys.   Fall Screening Does admit to have to fallen in the past year.  He fell 3 times this year. He's fallen twice in the past year from being clumsy and tripping himself. He had another fall after tripping over a twine while outdoors. He mentions having some balance issues.   He's had surgical history for:  Left knee surgery 2011 Left hip surgery 2011 Right knee 2001, 2006 and 2013  Functional status screening Difficulty hearing but uses hearing aids; no other positive responses.   Vision  Visual Acuity Screening   Right eye Left eye Both eyes  Without correction:     With correction: 20/30 20/40 20/25    Will see eye doctor next month.   Dentist He sees dentist annually.   Exercise He hasn't been exercising lately. He does note doing yard work.   HTN He's doing well on his complications. He denies chest pain, urinary symptoms, bowel symptoms, headaches or dizziness.   Urinary Frequency He noticed increased urinary frequency. He drinks more water when he's stressed. He noted trying to sell his house. He denies back pain, abdominal pain, or fever. History of hematuria - multiple family members with same. Last urology eval years ago - unknown plan on follow up.   Hammertoe deformity Followed by podiatry.   There are no active problems to display for this patient.  Past Medical History  Diagnosis Date  . Arthritis   . Depression   . Chronic kidney disease   . Hypertension  Past Surgical History  Procedure Laterality Date  . Joint replacement     Allergies  Allergen Reactions  . Dilaudid [Hydromorphone Hcl]    Prior to Admission medications   Medication Sig Start Date End Date Taking? Authorizing Provider  acetaminophen (TYLENOL) 500 MG tablet Take 500 mg by mouth every 6 (six) hours as needed.    Historical Provider, MD  buPROPion (WELLBUTRIN SR) 200 MG 12 hr tablet TAKE 1 TABLET TWICE A DAY ( NO MORE REFILLS WITHOUT APPOINTMENT) 11/02/15   Wendie Agreste, MD  valsartan (DIOVAN) 160 MG tablet TAKE 1 TABLET DAILY 09/28/15   Wendie Agreste, MD   Social History   Social History  . Marital Status: Married    Spouse Name: N/A  . Number of Children: N/A  . Years of Education: N/A   Occupational History  . Retired    Social History Main Topics  . Smoking status: Former Research scientist (life sciences)  . Smokeless tobacco: Not on file  . Alcohol Use: 2.4 oz/week    4 Standard drinks or equivalent per week  . Drug Use: No  . Sexual Activity: Not on file   Other Topics Concern  . Not on file   Social History Narrative   Married   Education: College   Exercise: No   Review of Systems  Constitutional: Negative for fatigue and unexpected weight change.  Eyes: Negative for visual disturbance.  Respiratory: Negative for cough, chest tightness and shortness of breath.   Cardiovascular: Negative for chest pain, palpitations and leg swelling.  Gastrointestinal: Negative for abdominal pain and blood in stool.  Genitourinary: Positive for frequency.  Musculoskeletal: Positive for arthralgias.  Neurological: Negative for dizziness, light-headedness and headaches.   13 point ROS: Urinary frequency, Joint pain, neck stiffness    Objective:   Physical Exam  Constitutional: He is oriented to person, place, and time. He appears well-developed and well-nourished.  HENT:  Head: Normocephalic and atraumatic.  Right Ear: External ear normal.  Left Ear: External ear normal.  Mouth/Throat: Oropharynx is clear and moist.  Eyes: Conjunctivae and EOM are normal. Pupils are equal, round, and reactive to light.  Neck: Normal range of motion. Neck supple. No thyromegaly present.  Cardiovascular: Normal rate, regular rhythm, normal heart sounds and intact distal pulses.   Pulmonary/Chest: Effort normal and breath sounds normal. No respiratory distress. He has no wheezes.  Abdominal: Soft. He exhibits no distension. There is no tenderness.  Musculoskeletal: Normal range  of motion. He exhibits no edema or tenderness.  Prominence at 2nd IP joint, slight IP flexion compared to right, but strength is intact, discomfort at the IP  Lymphadenopathy:    He has no cervical adenopathy.  Neurological: He is alert and oriented to person, place, and time. He has normal reflexes.  Skin: Skin is warm and dry.  Psychiatric: He has a normal mood and affect. His behavior is normal.  Vitals reviewed.   Filed Vitals:   12/17/15 1011  BP: 130/72  Pulse: 66  Temp: 98 F (36.7 C)  TempSrc: Oral  Resp: 16  Height: 5\' 11"  (1.803 m)  Weight: 217 lb (98.431 kg)  SpO2: 98%   Results for orders placed or performed in visit on 12/17/15  POCT urinalysis dipstick  Result Value Ref Range   Color, UA yellow yellow   Clarity, UA clear clear   Glucose, UA negative negative   Bilirubin, UA negative negative   Ketones, POC UA negative negative   Spec Grav, UA 1.020  Blood, UA moderate (A) negative   pH, UA 6.0    Protein Ur, POC negative negative   Urobilinogen, UA 0.2    Nitrite, UA Negative Negative   Leukocytes, UA Negative Negative  POCT Microscopic Urinalysis ([facility])  Result Value Ref Range   WBC,UR,HPF,POC None None WBC/hpf   RBC,UR,HPF,POC Few (A) None RBC/hpf   Bacteria None None, Too numerous to count   Mucus Absent Absent   Epithelial Cells, UR Per Microscopy None None, Too numerous to count cells/hpf       Assessment & Plan:     Joseph Maldonado is a 70 y.o. male Medicare annual wellness visit, subsequent - Plan: Ambulatory referral to Physical Therapy  - -anticipatory guidance as below in AVS, screening labs above. Health maintenance items as above in HPI discussed/recommended as applicable.   History of fall - Plan: Ambulatory referral to Physical Therapy History of bilateral knee replacement  - Reported relative balance issues since knee replacement. We'll try physical therapy to see if this can lessen chance of falls and improve his  balance/strength.  Need for prophylactic vaccination against Streptococcus pneumoniae (pneumococcus) - Plan: Pneumococcal polysaccharide vaccine 23-valent greater than or equal to 2yo subcutaneous/IM,  - Pneumovax given as by records previous dose was prior to age 68.   Depression  -Stable, continue Wellbutrin at same dose. No new side effects.  Essential hypertension - Plan: COMPLETE METABOLIC PANEL WITH GFR, Lipid panel, valsartan (DIOVAN) 160 MG tablet  -Stable, continue valsartan 160 mg daily. CMP pending.  Microscopic hematuria - Plan: Urine culture, Ambulatory referral to Urology Urinary frequency - Plan: POCT urinalysis dipstick, POCT Microscopic Urinalysis ([facility]), Urine culture, Ambulatory referral to Urology  -History of microscopic hematuria, but with urinary frequency, and without weights recent evaluation with urology, will refer to urology for decision on any further workup for hematuria, or recommendations on how often to check for changes. RTC precautions if worsening symptoms.  Thumb pain, left  - Suspect osteoarthritis versus previous fracture from injury and secondary bony change. Tylenol as needed, and if worsening symptoms, return to recheck with possible x-ray.  Elevated serum creatinine  -Avoid nephrotoxins/NSAIDs as possible. Repeat CMP.  Screening for hyperlipidemia - Plan: Lipid panel   Meds ordered this encounter  Medications  . meclizine (ANTIVERT) 25 MG tablet    Sig: Take 25 mg by mouth 3 (three) times daily as needed for dizziness.  Marland Kitchen buPROPion (WELLBUTRIN SR) 200 MG 12 hr tablet    Sig: TAKE 1 TABLET TWICE A DAY    Dispense:  180 tablet    Refill:  2  . valsartan (DIOVAN) 160 MG tablet    Sig: TAKE 1 TABLET DAILY    Dispense:  90 tablet    Refill:  2   Patient Instructions       IF you received an x-ray today, you will receive an invoice from [facility]. Please contact [facility] at [phone] with questions or concerns  regarding your invoice.   IF you received labwork today, you will receive an invoice from [employer]. Please contact [facility] at [phone] with questions or concerns regarding your invoice.   Our billing staff will not be able to assist you with questions regarding bills from these companies.  You will be contacted with the lab results as soon as they are available. The fastest way to get your results is to activate your My Chart account. Instructions are located on the last page of this paperwork. If you have  not heard from [name] regarding the results in 2 weeks, please contact this office.    I will refer you for physical therapy.  If balance difficulty persists - return to discuss further.  Meclizine if needed for episodic vertigo - return if more persistent or worsening.  If urinary frequency or number of times urinating at night increases - return for recheck and possible repeat prostate test.  You did have a few blood cells on urine test  - will refer you to urology to decide if other testing needed and monitoring frequency.   Return to the clinic or go to the nearest emergency room if any of your symptoms worsen or new symptoms occur.  You should receive a call or letter about your lab results within the next week to 10 days.   Keeping you healthy  Get these tests  Blood pressure- Have your blood pressure checked once a year by your healthcare provider.  Normal blood pressure is 120/80  Weight- Have your body mass index (BMI) calculated to screen for obesity.  BMI is a measure of body fat based on height and weight. You can also calculate your own BMI at ViewBanking.si.  Cholesterol- Have your cholesterol checked every year.  Diabetes- Have your blood sugar checked regularly if you have high blood pressure, high cholesterol, have a family history of diabetes or if you are overweight.  Screening for Colon Cancer- Colonoscopy starting at age 46.   Screening may begin sooner depending on your family history and other health conditions. Follow up colonoscopy as directed by your Gastroenterologist.  Screening for Prostate Cancer- Both blood work (PSA) and a rectal exam help screen for Prostate Cancer.  Screening begins at age 83 with African-American men and at age 69 with Caucasian men.  Screening may begin sooner depending on your family history.  Take these medicines  Aspirin- One aspirin daily can help prevent Heart disease and Stroke.  Flu shot- Every fall.  Tetanus- Every 10 years.  Zostavax- Once after the age of 77 to prevent Shingles.  Pneumonia shot- Once after the age of 45; if you are younger than 40, ask your healthcare provider if you need a Pneumonia shot.  Take these steps  Don't smoke- If you do smoke, talk to your doctor about quitting.  For tips on how to quit, go to www.smokefree.gov or call 1-800-QUIT-NOW.  Be physically active- Exercise 5 days a week for at least 30 minutes.  If you are not already physically active start slow and gradually work up to 30 minutes of moderate physical activity.  Examples of moderate activity include walking briskly, mowing the yard, dancing, swimming, bicycling, etc.  Eat a healthy diet- Eat a variety of healthy food such as fruits, vegetables, low fat milk, low fat cheese, yogurt, lean meant, poultry, fish, beans, tofu, etc. For more information go to www.thenutritionsource.org  Drink alcohol in moderation- Limit alcohol intake to less than two drinks a day. Never drink and drive.  Dentist- Brush and floss twice daily; visit your dentist twice a year.  Depression- Your emotional health is as important as your physical health. If you're feeling down, or losing interest in things you would normally enjoy please talk to your healthcare provider.  Eye exam- Visit your eye doctor every year.  Safe sex- If you may be exposed to a sexually transmitted infection, use a condom.  Seat  belts- Seat belts can save your life; always wear one.  Smoke/Carbon Building services engineer- These detectors need  to be installed on the appropriate level of your home.  Replace batteries at least once a year.  Skin cancer- When out in the sun, cover up and use sunscreen 15 SPF or higher.  Violence- If anyone is threatening you, please tell your healthcare provider. Living Will/ Health care power of attorney- Speak with your healthcare provider and family.   Urinary Frequency The number of times a normal person urinates depends upon how much liquid they take in and how much liquid they are losing. If the temperature is hot and there is high humidity, then the person will sweat more and usually breathe a little more frequently. These factors decrease the amount of frequency of urination that would be considered normal. The amount you drink is easily determined, but the amount of fluid lost is sometimes more difficult to calculate.  Fluid is lost in two ways:  Sensible fluid loss is usually measured by the amount of urine that you get rid of. Losses of fluid can also occur with diarrhea.  Insensible fluid loss is more difficult to measure. It is caused by evaporation. Insensible loss of fluid occurs through breathing and sweating. It usually ranges from a little less than a quart to a little more than a quart of fluid a day. In normal temperatures and activity levels, the average person may urinate 4 to 7 times in a 24-hour period. Needing to urinate more often than that could indicate a problem. If one urinates 4 to 7 times in 24 hours and has large volumes each time, that could indicate a different problem from one who urinates 4 to 7 times a day and has small volumes. The time of urinating is also important. Most urinating should be done during the waking hours. Getting up at night to urinate frequently can indicate some problems. CAUSES  The bladder is the organ in your lower abdomen that holds  urine. Like a balloon, it swells some as it fills up. Your nerves sense this and tell you it is time to head for the bathroom. There are a number of reasons that you might feel the need to urinate more often than usual. They include:  Urinary tract infection. This is usually associated with other signs such as burning when you urinate.  In men, problems with the prostate (a walnut-size gland that is located near the tube that carries urine out of your body). There are two reasons why the prostate can cause an increased frequency of urination:  An enlarged prostate that does not let the bladder empty well. If the bladder only half empties when you urinate, then it only has half the capacity to fill before you have to urinate again.  The nerves in the bladder become more hypersensitive with an increased size of the prostate even if the bladder empties completely.  Pregnancy.  Obesity. Excess weight is more likely to cause a problem for women than for men.  Bladder stones or other bladder problems.  Caffeine.  Alcohol.  Medications. For example, drugs that help the body get rid of extra fluid (diuretics) increase urine production. Some other medicines must be taken with lots of fluids.  Muscle or nerve weakness. This might be the result of a spinal cord injury, a stroke, multiple sclerosis, or Parkinson disease.  Long-standing diabetes can decrease the sensation of the bladder. This loss of sensation makes it harder to sense the bladder needs to be emptied. Over a period of years, the bladder is stretched out by  constant overfilling. This weakens the bladder muscles so that the bladder does not empty well and has less capacity to fill with new urine.  Interstitial cystitis (also called painful bladder syndrome). This condition develops because the tissues that line the inside of the bladder are inflamed (inflammation is the body's way of reacting to injury or infection). It causes pain and  frequent urination. It occurs in women more often than in men. DIAGNOSIS   To decide what might be causing your urinary frequency, your health care provider will probably:  Ask about symptoms you have noticed.  Ask about your overall health. This will include questions about any medications you are taking.  Do a physical examination.  Order some tests. These might include:  A blood test to check for diabetes or other health issues that could be contributing to the problem.  Urine testing. This could measure the flow of urine and the pressure on the bladder.  A test of your neurological system (the brain, spinal cord, and nerves). This is the system that senses the need to urinate.  A bladder test to check whether it is emptying completely when you urinate.  Cystoscopy. This test uses a thin tube with a tiny camera on it. It offers a look inside your urethra and bladder to see if there are problems.  Imaging tests. You might be given a contrast dye and then asked to urinate. X-rays are taken to see how your bladder is working. TREATMENT  It is important for you to be evaluated to determine if the amount or frequency that you have is unusual or abnormal. If it is found to be abnormal, the cause should be determined and this can usually be found out easily. Depending upon the cause, treatment could include medication, stimulation of the nerves, or surgery. There are not too many things that you can do as an individual to change your urinary frequency. It is important that you balance the amount of fluid intake needed to compensate for your activity and the temperature. Medical problems will be diagnosed and taken care of by your physician. There is no particular bladder training such as Kegel exercises that you can do to help urinary frequency. This is an exercise that is usually recommended for people who have leaking of urine when they laugh, cough, or sneeze. HOME CARE INSTRUCTIONS    Take any medications your health care provider prescribed or suggested. Follow the directions carefully.  Practice any lifestyle changes that are recommended. These might include:  Drinking less fluid or drinking at different times of the day. If you need to urinate often during the night, for example, you may need to stop drinking fluids early in the evening.  Cutting down on caffeine or alcohol. They both can make you need to urinate more often than normal. Caffeine is found in coffee, tea, and sodas.  Losing weight, if that is recommended.  Keep a journal or a log. You might be asked to record how much you drink and when and where you feel the need to urinate. This will also help evaluate how well the treatment provided by your physician is working. SEEK MEDICAL CARE IF:   Your need to urinate often gets worse.  You feel increased pain or irritation when you urinate.  You notice blood in your urine.  You have questions about any medications that your health care provider recommended.  You notice blood, pus, or swelling at the site of any test or treatment procedure.  You develop a fever of more than 100.564F (38.1C). SEEK IMMEDIATE MEDICAL CARE IF:  You develop a fever of more than 102.64F (38.9C).   This information is not intended to replace advice given to you by your health care provider. Make sure you discuss any questions you have with your health care provider.   Document Released: 07/10/2009 Document Revised: 10/04/2014 Document Reviewed: 07/10/2009 Elsevier Interactive Patient Education 2016 Elsevier Inc. Hematuria, Adult Hematuria is blood in your urine. It can be caused by a bladder infection, kidney infection, prostate infection, kidney stone, or cancer of your urinary tract. Infections can usually be treated with medicine, and a kidney stone usually will pass through your urine. If neither of these is the cause of your hematuria, further workup to find out the  reason may be needed. It is very important that you tell your health care provider about any blood you see in your urine, even if the blood stops without treatment or happens without causing pain. Blood in your urine that happens and then stops and then happens again can be a symptom of a very serious condition. Also, pain is not a symptom in the initial stages of many urinary cancers. HOME CARE INSTRUCTIONS   Drink lots of fluid, 3-4 quarts a day. If you have been diagnosed with an infection, cranberry juice is especially recommended, in addition to large amounts of water.  Avoid caffeine, tea, and carbonated beverages because they tend to irritate the bladder.  Avoid alcohol because it may irritate the prostate.  Take all medicines as directed by your health care provider.  If you were prescribed an antibiotic medicine, finish it all even if you start to feel better.  If you have been diagnosed with a kidney stone, follow your health care provider's instructions regarding straining your urine to catch the stone.  Empty your bladder often. Avoid holding urine for long periods of time.  After a bowel movement, women should cleanse front to back. Use each tissue only once.  Empty your bladder before and after sexual intercourse if you are a male. SEEK MEDICAL CARE IF:  You develop back pain.  You have a fever.  You have a feeling of sickness in your stomach (nausea) or vomiting.  Your symptoms are not better in 3 days. Return sooner if you are getting worse. SEEK IMMEDIATE MEDICAL CARE IF:   You develop severe vomiting and are unable to keep the medicine down.  You develop severe back or abdominal pain despite taking your medicines.  You begin passing a large amount of blood or clots in your urine.  You feel extremely weak or faint, or you pass out. MAKE SURE YOU:   Understand these instructions.  Will watch your condition.  Will get help right away if you are not doing  well or get worse.   This information is not intended to replace advice given to you by your health care provider. Make sure you discuss any questions you have with your health care provider.   Document Released: 09/13/2005 Document Revised: 10/04/2014 Document Reviewed: 05/14/2013 Elsevier Interactive Patient Education [employer].     I personally performed the services described in this documentation, which was scribed in my presence. The recorded information has been reviewed and considered, and addended by me as needed.

## 2015-12-17 NOTE — Patient Instructions (Addendum)
IF you received an x-ray today, you will receive an invoice from [facility]. Please contact [facility] at [phone] with questions or concerns regarding your invoice.   IF you received labwork today, you will receive an invoice from [employer]. Please contact [facility] at [phone] with questions or concerns regarding your invoice.   Our billing staff will not be able to assist you with questions regarding bills from these companies.  You will be contacted with the lab results as soon as they are available. The fastest way to get your results is to activate your My Chart account. Instructions are located on the last page of this paperwork. If you have not heard from [name] regarding the results in 2 weeks, please contact this office.    I will refer you for physical therapy.  If balance difficulty persists - return to discuss further.  Meclizine if needed for episodic vertigo - return if more persistent or worsening.  If urinary frequency or number of times urinating at night increases - return for recheck and possible repeat prostate test.  You did have a few blood cells on urine test  - will refer you to urology to decide if other testing needed and monitoring frequency.   Return to the clinic or go to the nearest emergency room if any of your symptoms worsen or new symptoms occur.  You should receive a call or letter about your lab results within the next week to 10 days.   Keeping you healthy  Get these tests  Blood pressure- Have your blood pressure checked once a year by your healthcare provider.  Normal blood pressure is 120/80  Weight- Have your body mass index (BMI) calculated to screen for obesity.  BMI is a measure of body fat based on height and weight. You can also calculate your own BMI at ViewBanking.si.  Cholesterol- Have your cholesterol checked every year.  Diabetes- Have your blood sugar checked regularly  if you have high blood pressure, high cholesterol, have a family history of diabetes or if you are overweight.  Screening for Colon Cancer- Colonoscopy starting at age 40.  Screening may begin sooner depending on your family history and other health conditions. Follow up colonoscopy as directed by your Gastroenterologist.  Screening for Prostate Cancer- Both blood work (PSA) and a rectal exam help screen for Prostate Cancer.  Screening begins at age 43 with African-American men and at age 70 with Caucasian men.  Screening may begin sooner depending on your family history.  Take these medicines  Aspirin- One aspirin daily can help prevent Heart disease and Stroke.  Flu shot- Every fall.  Tetanus- Every 10 years.  Zostavax- Once after the age of 58 to prevent Shingles.  Pneumonia shot- Once after the age of 42; if you are younger than 55, ask your healthcare provider if you need a Pneumonia shot.  Take these steps  Don't smoke- If you do smoke, talk to your doctor about quitting.  For tips on how to quit, go to www.smokefree.gov or call 1-800-QUIT-NOW.  Be physically active- Exercise 5 days a week for at least 30 minutes.  If you are not already physically active start slow and gradually work up to 30 minutes of moderate physical activity.  Examples of moderate activity include walking briskly, mowing the yard, dancing, swimming, bicycling, etc.  Eat a healthy diet- Eat a variety of healthy food such as fruits, vegetables, low fat milk, low fat cheese, yogurt, lean  meant, poultry, fish, beans, tofu, etc. For more information go to www.thenutritionsource.org  Drink alcohol in moderation- Limit alcohol intake to less than two drinks a day. Never drink and drive.  Dentist- Brush and floss twice daily; visit your dentist twice a year.  Depression- Your emotional health is as important as your physical health. If you're feeling down, or losing interest in things you would normally enjoy please  talk to your healthcare provider.  Eye exam- Visit your eye doctor every year.  Safe sex- If you may be exposed to a sexually transmitted infection, use a condom.  Seat belts- Seat belts can save your life; always wear one.  Smoke/Carbon Monoxide detectors- These detectors need to be installed on the appropriate level of your home.  Replace batteries at least once a year.  Skin cancer- When out in the sun, cover up and use sunscreen 15 SPF or higher.  Violence- If anyone is threatening you, please tell your healthcare provider. Living Will/ Health care power of attorney- Speak with your healthcare provider and family.   Urinary Frequency The number of times a normal person urinates depends upon how much liquid they take in and how much liquid they are losing. If the temperature is hot and there is high humidity, then the person will sweat more and usually breathe a little more frequently. These factors decrease the amount of frequency of urination that would be considered normal. The amount you drink is easily determined, but the amount of fluid lost is sometimes more difficult to calculate.  Fluid is lost in two ways:  Sensible fluid loss is usually measured by the amount of urine that you get rid of. Losses of fluid can also occur with diarrhea.  Insensible fluid loss is more difficult to measure. It is caused by evaporation. Insensible loss of fluid occurs through breathing and sweating. It usually ranges from a little less than a quart to a little more than a quart of fluid a day. In normal temperatures and activity levels, the average person may urinate 4 to 7 times in a 24-hour period. Needing to urinate more often than that could indicate a problem. If one urinates 4 to 7 times in 24 hours and has large volumes each time, that could indicate a different problem from one who urinates 4 to 7 times a day and has small volumes. The time of urinating is also important. Most urinating should  be done during the waking hours. Getting up at night to urinate frequently can indicate some problems. CAUSES  The bladder is the organ in your lower abdomen that holds urine. Like a balloon, it swells some as it fills up. Your nerves sense this and tell you it is time to head for the bathroom. There are a number of reasons that you might feel the need to urinate more often than usual. They include:  Urinary tract infection. This is usually associated with other signs such as burning when you urinate.  In men, problems with the prostate (a walnut-size gland that is located near the tube that carries urine out of your body). There are two reasons why the prostate can cause an increased frequency of urination:  An enlarged prostate that does not let the bladder empty well. If the bladder only half empties when you urinate, then it only has half the capacity to fill before you have to urinate again.  The nerves in the bladder become more hypersensitive with an increased size of the prostate even  if the bladder empties completely.  Pregnancy.  Obesity. Excess weight is more likely to cause a problem for women than for men.  Bladder stones or other bladder problems.  Caffeine.  Alcohol.  Medications. For example, drugs that help the body get rid of extra fluid (diuretics) increase urine production. Some other medicines must be taken with lots of fluids.  Muscle or nerve weakness. This might be the result of a spinal cord injury, a stroke, multiple sclerosis, or Parkinson disease.  Long-standing diabetes can decrease the sensation of the bladder. This loss of sensation makes it harder to sense the bladder needs to be emptied. Over a period of years, the bladder is stretched out by constant overfilling. This weakens the bladder muscles so that the bladder does not empty well and has less capacity to fill with new urine.  Interstitial cystitis (also called painful bladder syndrome). This  condition develops because the tissues that line the inside of the bladder are inflamed (inflammation is the body's way of reacting to injury or infection). It causes pain and frequent urination. It occurs in women more often than in men. DIAGNOSIS   To decide what might be causing your urinary frequency, your health care provider will probably:  Ask about symptoms you have noticed.  Ask about your overall health. This will include questions about any medications you are taking.  Do a physical examination.  Order some tests. These might include:  A blood test to check for diabetes or other health issues that could be contributing to the problem.  Urine testing. This could measure the flow of urine and the pressure on the bladder.  A test of your neurological system (the brain, spinal cord, and nerves). This is the system that senses the need to urinate.  A bladder test to check whether it is emptying completely when you urinate.  Cystoscopy. This test uses a thin tube with a tiny camera on it. It offers a look inside your urethra and bladder to see if there are problems.  Imaging tests. You might be given a contrast dye and then asked to urinate. X-rays are taken to see how your bladder is working. TREATMENT  It is important for you to be evaluated to determine if the amount or frequency that you have is unusual or abnormal. If it is found to be abnormal, the cause should be determined and this can usually be found out easily. Depending upon the cause, treatment could include medication, stimulation of the nerves, or surgery. There are not too many things that you can do as an individual to change your urinary frequency. It is important that you balance the amount of fluid intake needed to compensate for your activity and the temperature. Medical problems will be diagnosed and taken care of by your physician. There is no particular bladder training such as Kegel exercises that you can do to  help urinary frequency. This is an exercise that is usually recommended for people who have leaking of urine when they laugh, cough, or sneeze. HOME CARE INSTRUCTIONS   Take any medications your health care provider prescribed or suggested. Follow the directions carefully.  Practice any lifestyle changes that are recommended. These might include:  Drinking less fluid or drinking at different times of the day. If you need to urinate often during the night, for example, you may need to stop drinking fluids early in the evening.  Cutting down on caffeine or alcohol. They both can make you need to urinate  more often than normal. Caffeine is found in coffee, tea, and sodas.  Losing weight, if that is recommended.  Keep a journal or a log. You might be asked to record how much you drink and when and where you feel the need to urinate. This will also help evaluate how well the treatment provided by your physician is working. SEEK MEDICAL CARE IF:   Your need to urinate often gets worse.  You feel increased pain or irritation when you urinate.  You notice blood in your urine.  You have questions about any medications that your health care provider recommended.  You notice blood, pus, or swelling at the site of any test or treatment procedure.  You develop a fever of more than 100.336F (38.1C). SEEK IMMEDIATE MEDICAL CARE IF:  You develop a fever of more than 102.36F (38.9C).   This information is not intended to replace advice given to you by your health care provider. Make sure you discuss any questions you have with your health care provider.   Document Released: 07/10/2009 Document Revised: 10/04/2014 Document Reviewed: 07/10/2009 Elsevier Interactive Patient Education 2016 Elsevier Inc. Hematuria, Adult Hematuria is blood in your urine. It can be caused by a bladder infection, kidney infection, prostate infection, kidney stone, or cancer of your urinary tract. Infections can usually  be treated with medicine, and a kidney stone usually will pass through your urine. If neither of these is the cause of your hematuria, further workup to find out the reason may be needed. It is very important that you tell your health care provider about any blood you see in your urine, even if the blood stops without treatment or happens without causing pain. Blood in your urine that happens and then stops and then happens again can be a symptom of a very serious condition. Also, pain is not a symptom in the initial stages of many urinary cancers. HOME CARE INSTRUCTIONS   Drink lots of fluid, 3-4 quarts a day. If you have been diagnosed with an infection, cranberry juice is especially recommended, in addition to large amounts of water.  Avoid caffeine, tea, and carbonated beverages because they tend to irritate the bladder.  Avoid alcohol because it may irritate the prostate.  Take all medicines as directed by your health care provider.  If you were prescribed an antibiotic medicine, finish it all even if you start to feel better.  If you have been diagnosed with a kidney stone, follow your health care provider's instructions regarding straining your urine to catch the stone.  Empty your bladder often. Avoid holding urine for long periods of time.  After a bowel movement, women should cleanse front to back. Use each tissue only once.  Empty your bladder before and after sexual intercourse if you are a male. SEEK MEDICAL CARE IF:  You develop back pain.  You have a fever.  You have a feeling of sickness in your stomach (nausea) or vomiting.  Your symptoms are not better in 3 days. Return sooner if you are getting worse. SEEK IMMEDIATE MEDICAL CARE IF:   You develop severe vomiting and are unable to keep the medicine down.  You develop severe back or abdominal pain despite taking your medicines.  You begin passing a large amount of blood or clots in your urine.  You feel  extremely weak or faint, or you pass out. MAKE SURE YOU:   Understand these instructions.  Will watch your condition.  Will get help right away if you  are not doing well or get worse.   This information is not intended to replace advice given to you by your health care provider. Make sure you discuss any questions you have with your health care provider.   Document Released: 09/13/2005 Document Revised: 10/04/2014 Document Reviewed: 05/14/2013 Elsevier Interactive Patient Education [employer].

## 2015-12-18 LAB — URINE CULTURE
COLONY COUNT: NO GROWTH
Organism ID, Bacteria: NO GROWTH

## 2015-12-22 NOTE — Addendum Note (Signed)
Addended by: Wyatt Haste on: 12/22/2015 07:39 AM   Modules accepted: Miquel Dunn

## 2015-12-27 ENCOUNTER — Other Ambulatory Visit: Payer: Self-pay | Admitting: Family Medicine

## 2016-01-14 DIAGNOSIS — H1850 Unspecified hereditary corneal dystrophies: Secondary | ICD-10-CM | POA: Diagnosis not present

## 2016-01-14 DIAGNOSIS — H5203 Hypermetropia, bilateral: Secondary | ICD-10-CM | POA: Diagnosis not present

## 2016-01-14 DIAGNOSIS — H52223 Regular astigmatism, bilateral: Secondary | ICD-10-CM | POA: Diagnosis not present

## 2016-01-14 DIAGNOSIS — H2513 Age-related nuclear cataract, bilateral: Secondary | ICD-10-CM | POA: Diagnosis not present

## 2016-01-14 DIAGNOSIS — H35033 Hypertensive retinopathy, bilateral: Secondary | ICD-10-CM | POA: Diagnosis not present

## 2016-01-14 DIAGNOSIS — H524 Presbyopia: Secondary | ICD-10-CM | POA: Diagnosis not present

## 2016-03-09 DIAGNOSIS — D1801 Hemangioma of skin and subcutaneous tissue: Secondary | ICD-10-CM | POA: Diagnosis not present

## 2016-03-09 DIAGNOSIS — Z85828 Personal history of other malignant neoplasm of skin: Secondary | ICD-10-CM | POA: Diagnosis not present

## 2016-03-09 DIAGNOSIS — L814 Other melanin hyperpigmentation: Secondary | ICD-10-CM | POA: Diagnosis not present

## 2016-03-09 DIAGNOSIS — L821 Other seborrheic keratosis: Secondary | ICD-10-CM | POA: Diagnosis not present

## 2016-03-09 DIAGNOSIS — L570 Actinic keratosis: Secondary | ICD-10-CM | POA: Diagnosis not present

## 2016-05-21 ENCOUNTER — Telehealth: Payer: Self-pay

## 2016-05-21 DIAGNOSIS — Z461 Encounter for fitting and adjustment of hearing aid: Secondary | ICD-10-CM

## 2016-05-21 NOTE — Telephone Encounter (Signed)
Joseph Maldonado IS HIS PCP AND HE KNOWS HE HAS A HEARING DEFICIENT. Joseph Maldonado WOULD LIKE Joseph Maldonado TO REFER HIM TO [facility] [place]. HIS INSURANCE IS MEDICARE SO HE NEEDS THE REFERRAL TO SAY HE NEEDS A  "RADIOLOGICAL EVALUATION" SO THAT MEDICARE WILL PAY FOR THE OFFICE VISIT. [place] PHONE [phone] PATIENT'S PHONE [phone].  [place]

## 2016-05-24 NOTE — Telephone Encounter (Signed)
Left message for pt to call back  °

## 2016-05-24 NOTE — Telephone Encounter (Signed)
No problem, happy to refer him, but please clarify what information needs to be placed on the referral. Did he mean audiological evaluation?

## 2016-05-26 NOTE — Telephone Encounter (Signed)
Spoke pt, advised referral placed.

## 2016-06-09 DIAGNOSIS — R319 Hematuria, unspecified: Secondary | ICD-10-CM | POA: Diagnosis not present

## 2016-06-09 DIAGNOSIS — R3121 Asymptomatic microscopic hematuria: Secondary | ICD-10-CM | POA: Diagnosis not present

## 2016-06-09 DIAGNOSIS — R351 Nocturia: Secondary | ICD-10-CM | POA: Diagnosis not present

## 2016-06-15 DIAGNOSIS — H903 Sensorineural hearing loss, bilateral: Secondary | ICD-10-CM | POA: Diagnosis not present

## 2016-06-18 DIAGNOSIS — R3121 Asymptomatic microscopic hematuria: Secondary | ICD-10-CM | POA: Diagnosis not present

## 2016-06-18 DIAGNOSIS — R3129 Other microscopic hematuria: Secondary | ICD-10-CM | POA: Diagnosis not present

## 2016-06-30 DIAGNOSIS — R350 Frequency of micturition: Secondary | ICD-10-CM | POA: Diagnosis not present

## 2016-06-30 DIAGNOSIS — R3121 Asymptomatic microscopic hematuria: Secondary | ICD-10-CM | POA: Diagnosis not present

## 2016-07-07 ENCOUNTER — Ambulatory Visit (INDEPENDENT_AMBULATORY_CARE_PROVIDER_SITE_OTHER): Payer: Medicare Other | Admitting: Family Medicine

## 2016-07-07 ENCOUNTER — Telehealth: Payer: Self-pay

## 2016-07-07 VITALS — BP 124/68 | HR 58 | Temp 97.4°F | Resp 17 | Ht 71.0 in | Wt 215.0 lb

## 2016-07-07 DIAGNOSIS — R0982 Postnasal drip: Secondary | ICD-10-CM | POA: Diagnosis not present

## 2016-07-07 DIAGNOSIS — R05 Cough: Secondary | ICD-10-CM | POA: Diagnosis not present

## 2016-07-07 DIAGNOSIS — R059 Cough, unspecified: Secondary | ICD-10-CM

## 2016-07-07 DIAGNOSIS — Z23 Encounter for immunization: Secondary | ICD-10-CM | POA: Diagnosis not present

## 2016-07-07 MED ORDER — GUAIFENESIN-CODEINE 200-10 MG/5ML PO LIQD: 5.0000 mL | Freq: Three times a day (TID) | ORAL | 0 refills | Status: DC | PRN

## 2016-07-07 MED ORDER — AZELASTINE HCL 0.1 % NA SOLN: 2.0000 | Freq: Two times a day (BID) | NASAL | 12 refills | Status: DC

## 2016-07-07 NOTE — Telephone Encounter (Signed)
[facility] called to report that they do not have the 200-10mg  of guaifenesin-codeine and when they look it up to order it, looks like it is not available any longer. They asked permission to dispense the comb liqd of 100 mg of guaifenesin with 10 mg of codeine instead. Dr Mingo Amber, I gave them permission to do so since the codeine is same strength and guaifenesin is lower mg than orig, so that pt can get started on treatment. Please let me know if you need me to call pharm or pt back.

## 2016-07-07 NOTE — Patient Instructions (Addendum)
Use the antihistamine spray 2 sprays in each nostril twice a day. This should help with some of the postnasal drip. This is likely residual from a recent upper respiratory infection or sinus infection that has now resolved.  Use the guaifenesin/codeine 5-10 mL every 6-8 hours as needed for cough. If this isn't helping call and we can try Tessalon Perles at that time.  Restarting fevers or chills or worsening symptoms despite medication come back and see [name]. It was very good to meet you today!    IF you received an x-ray today, you will receive an invoice from [facility]. Please contact [facility] at [phone] with questions or concerns regarding your invoice.   IF you received labwork today, you will receive an invoice from [employer]. Please contact [facility] at [phone] with questions or concerns regarding your invoice.   Our billing staff will not be able to assist you with questions regarding bills from these companies.  You will be contacted with the lab results as soon as they are available. The fastest way to get your results is to activate your My Chart account. Instructions are located on the last page of this paperwork. If you have not heard from [name] regarding the results in 2 weeks, please contact this office.

## 2016-07-07 NOTE — Telephone Encounter (Signed)
Yep this is great.  Thanks so much!

## 2016-07-07 NOTE — Progress Notes (Signed)
   Joseph Maldonado is a 71 y.o. male who presents to [facility] today for dry cough:  1.  Dry cough: Present for the past 10 days or so. States he has this every several years. Usually caused by some form of irritant.  Said he had some form of sinus/URI symptoms about 2 weeks ago and this started after that. 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 Coughing was worsened after exposure to these irritants.  No fevers or chills. No chest pain.  No LE edema.  States codeine and tessalon perles have helped in the past.  Has had negative CXR's in past, but states he had CT abd at urologist which showed pulm nodule about 2 weeks ago with recommendation for repeat imaging in 1 year.   His been tested with pulmonary function testing in the past for asthma negative. Denies any symptoms or history of GERD. Denies any history of seasonal/allergic rhinitis.  ROS as above.    PMH reviewed. Patient is a nonsmoker.   Past Medical History:  Diagnosis Date  . Arthritis   . Chronic kidney disease   . Depression   . Hypertension    Past Surgical History:  Procedure Laterality Date  . JOINT REPLACEMENT      Medications reviewed. Current Outpatient Prescriptions  Medication Sig Dispense Refill  . acetaminophen (TYLENOL) 500 MG tablet Take 500 mg by mouth every 6 (six) hours as needed.    Marland Kitchen buPROPion (WELLBUTRIN SR) 200 MG 12 hr tablet TAKE 1 TABLET TWICE A DAY 180 tablet 2  . valsartan (DIOVAN) 160 MG tablet TAKE 1 TABLET DAILY 90 tablet 2  . meclizine (ANTIVERT) 25 MG tablet Take 25 mg by mouth 3 (three) times daily as needed for dizziness.     No current facility-administered medications for this visit.      Physical Exam:  BP 124/68 (BP Location: Right Arm, Patient Position: Sitting, Cuff Size: Large)   Pulse (!) 58   Temp 97.4 F (36.3 C) (Oral)   Resp 17   Ht 5\' 11"  (1.803 m)   Wt 215 lb (97.5 kg)   SpO2 99%   BMI 29.99  kg/m  Gen:  Patient sitting on exam table, appears stated age in no acute distress Head: Normocephalic atraumatic Eyes: EOMI, PERRL, sclera and conjunctiva non-erythematous Ears:  Canals clear bilaterally.  TMs pearly gray bilaterally without erythema or bulging.   Nose:  Nasal turbinates grossly enlarged bilaterally and boggy appearing with some exudates noted.   Mouth: Mucosa membranes moist. Tonsils +2, nonenlarged, non-erythematous. Neck: No cervical lymphadenopathy noted Heart:  RRR, no murmurs auscultated. Pulm:  Clear to auscultation bilaterally with good air movement.  No wheezes or rales noted.    Assessment and Plan:  1.  Cough: - likely secondary to irritant plus chronic postnasal drip.   Evidently he states this occurs every 2-3 years or so.  - Treat with inhaled antihistamine. -Also treated with prednisone with codeine. -He has had negative chest x-rays in the past. He does have a pulmonary nodule noted at his urologists. We do not have access to these images. Reportedly he was told to follow-up in one year. -Follow-up with [name] if cough does not remit. No evidence of asthma or GERD.

## 2016-07-21 ENCOUNTER — Ambulatory Visit: Payer: Medicare Other | Admitting: Family Medicine

## 2016-07-22 ENCOUNTER — Ambulatory Visit: Payer: Medicare Other | Admitting: Family Medicine

## 2016-07-23 DIAGNOSIS — R3121 Asymptomatic microscopic hematuria: Secondary | ICD-10-CM | POA: Diagnosis not present

## 2016-07-23 DIAGNOSIS — R350 Frequency of micturition: Secondary | ICD-10-CM | POA: Diagnosis not present

## 2016-07-26 ENCOUNTER — Telehealth: Payer: Self-pay

## 2016-07-26 NOTE — Telephone Encounter (Signed)
Dr. Mingo Amber, can I prescribe the Tessalon Pearls or do you want pt to return to clinic?  Please advise- Thanks

## 2016-07-26 NOTE — Telephone Encounter (Signed)
PATIENT STATES HE SAW DR. Mingo Amber ALMOST 3 WEEKS AGO FOR A COUGH. HE PRESCRIBED HIM GUAIFENESIN CODEINE SYRUP. IT ONLY WORKS TEMPORARY SO HE WOULD LIKE TO GET A PRESCRIPTION FOR TESSALON PEARLS. HE SAID HIS HOUSE IS UNDER CONSTRUCTION AND THERE IS A LOT OF DUST IN THE HOUSE THAT CAUSES HIM TO COUGH. HE THINKS THE TESSALON PEARLS WILL CONTROL HIS COUGH MORE EFFECTIVE. [facility] PHONE [phone] (CELL)  PHARMACY CHOICE IS [facility] ON [address].  [place]

## 2016-07-26 NOTE — Telephone Encounter (Signed)
Calling in [place] for him would be fine.  Thanks so much!  JW

## 2016-07-27 ENCOUNTER — Telehealth: Payer: Self-pay | Admitting: Emergency Medicine

## 2016-07-27 MED ORDER — BENZONATATE 100 MG PO CAPS: 100.0000 mg | ORAL_CAPSULE | Freq: Two times a day (BID) | ORAL | 1 refills | Status: DC | PRN

## 2016-07-27 NOTE — Telephone Encounter (Signed)
Pt informed- Tessalon Pearls called in to [facility] pharmacy per Dr. Mingo Amber

## 2016-07-29 ENCOUNTER — Encounter: Payer: Self-pay | Admitting: Family Medicine

## 2016-07-29 ENCOUNTER — Ambulatory Visit (INDEPENDENT_AMBULATORY_CARE_PROVIDER_SITE_OTHER): Payer: Medicare Other | Admitting: Family Medicine

## 2016-07-29 VITALS — BP 130/74 | HR 62 | Temp 98.1°F | Resp 16 | Ht 71.0 in | Wt 210.0 lb

## 2016-07-29 DIAGNOSIS — I10 Essential (primary) hypertension: Secondary | ICD-10-CM | POA: Diagnosis not present

## 2016-07-29 DIAGNOSIS — G4700 Insomnia, unspecified: Secondary | ICD-10-CM

## 2016-07-29 DIAGNOSIS — F329 Major depressive disorder, single episode, unspecified: Secondary | ICD-10-CM

## 2016-07-29 DIAGNOSIS — F32A Depression, unspecified: Secondary | ICD-10-CM

## 2016-07-29 DIAGNOSIS — E785 Hyperlipidemia, unspecified: Secondary | ICD-10-CM

## 2016-07-29 LAB — LIPID PANEL
CHOL/HDL RATIO: 3.5 ratio (ref <=5.0)
Cholesterol: 183 mg/dL (ref 125–200)
HDL: 52 mg/dL (ref >=40)
LDL Cholesterol: 98 mg/dL (ref <130)
Triglycerides: 167 mg/dL — ABNORMAL HIGH (ref <150)
VLDL: 33 mg/dL — ABNORMAL HIGH (ref <30)

## 2016-07-29 LAB — COMPLETE METABOLIC PANEL WITH GFR
ALBUMIN: 4.4 g/dL (ref 3.6–5.1)
ALT: 18 U/L (ref 9–46)
AST: 18 U/L (ref 10–35)
Alkaline Phosphatase: 84 U/L (ref 40–115)
BUN: 24 mg/dL (ref 7–25)
CO2: 24 mmol/L (ref 20–31)
Calcium: 10.1 mg/dL (ref 8.6–10.3)
Chloride: 106 mmol/L (ref 98–110)
Creat: 1.39 mg/dL — ABNORMAL HIGH (ref 0.70–1.25)
GFR, EST NON AFRICAN AMERICAN: 51 mL/min — AB (ref >=60)
GFR, Est African American: 59 mL/min — ABNORMAL LOW (ref >=60)
GLUCOSE: 91 mg/dL (ref 65–99)
Potassium: 5.1 mmol/L (ref 3.5–5.3)
SODIUM: 141 mmol/L (ref 135–146)
Total Bilirubin: 0.5 mg/dL (ref 0.2–1.2)
Total Protein: 6.7 g/dL (ref 6.1–8.1)

## 2016-07-29 MED ORDER — VALSARTAN 160 MG PO TABS
ORAL_TABLET | ORAL | 2 refills | Status: DC
Start: 2016-07-29 — End: 2017-04-18

## 2016-07-29 MED ORDER — ZOLPIDEM TARTRATE 5 MG PO TABS: 2.5000 mg | ORAL_TABLET | Freq: Every evening | ORAL | 0 refills | Status: DC | PRN

## 2016-07-29 MED ORDER — BUPROPION HCL ER (SR) 200 MG PO TB12: ORAL_TABLET | ORAL | 2 refills | Status: DC

## 2016-07-29 NOTE — Patient Instructions (Addendum)
Short course of Ambien was prescribed if needed. If you do require this medication persistently or long-term, would like to look at other options that may be safer. No change in your other medications for now.   If the cough is not improving based on the treatment plan from Dr. Mingo Amber, follow-up in the next week or 2. Air purifier in the bedroom may help.  Return to the clinic or go to the nearest emergency room if any of your symptoms worsen or new symptoms occur.  Insomnia Insomnia is a sleep disorder that makes it difficult to fall asleep or to stay asleep. Insomnia can cause tiredness (fatigue), low energy, difficulty concentrating, mood swings, and poor performance at work or school.  There are three different ways to classify insomnia:  Difficulty falling asleep.  Difficulty staying asleep.  Waking up too early in the morning. Any type of insomnia can be long-term (chronic) or short-term (acute). Both are common. Short-term insomnia usually lasts for three months or less. Chronic insomnia occurs at least three times a week for longer than three months. CAUSES  Insomnia may be caused by another condition, situation, or substance, such as:  Anxiety.  Certain medicines.  Gastroesophageal reflux disease (GERD) or other gastrointestinal conditions.  Asthma or other breathing conditions.  Restless legs syndrome, sleep apnea, or other sleep disorders.  Chronic pain.  Menopause. This may include hot flashes.  Stroke.  Abuse of alcohol, tobacco, or illegal drugs.  Depression.  Caffeine.   Neurological disorders, such as Alzheimer disease.  An overactive thyroid (hyperthyroidism). The cause of insomnia may not be known. RISK FACTORS Risk factors for insomnia include:  Gender. Women are more commonly affected than men.  Age. Insomnia is more common as you get older.  Stress. This may involve your professional or personal life.  Income. Insomnia is more common in  people with lower income.  Lack of exercise.   Irregular work schedule or night shifts.  Traveling between different time zones. SIGNS AND SYMPTOMS If you have insomnia, trouble falling asleep or trouble staying asleep is the main symptom. This may lead to other symptoms, such as:  Feeling fatigued.  Feeling nervous about going to sleep.  Not feeling rested in the morning.  Having trouble concentrating.  Feeling irritable, anxious, or depressed. TREATMENT  Treatment for insomnia depends on the cause. If your insomnia is caused by an underlying condition, treatment will focus on addressing the condition. Treatment may also include:   Medicines to help you sleep.  Counseling or therapy.  Lifestyle adjustments. HOME CARE INSTRUCTIONS   Take medicines only as directed by your health care provider.  Keep regular sleeping and waking hours. Avoid naps.  Keep a sleep diary to help you and your health care provider figure out what could be causing your insomnia. Include:   When you sleep.  When you wake up during the night.  How well you sleep.   How rested you feel the next day.  Any side effects of medicines you are taking.  What you eat and drink.   Make your bedroom a comfortable place where it is easy to fall asleep:  Put up shades or special blackout curtains to block light from outside.  Use a white noise machine to block noise.  Keep the temperature cool.   Exercise regularly as directed by your health care provider. Avoid exercising right before bedtime.  Use relaxation techniques to manage stress. Ask your health care provider to suggest some techniques that  may work well for you. These may include:  Breathing exercises.  Routines to release muscle tension.  Visualizing peaceful scenes.  Cut back on alcohol, caffeinated beverages, and cigarettes, especially close to bedtime. These can disrupt your sleep.  Do not overeat or eat spicy foods right  before bedtime. This can lead to digestive discomfort that can make it hard for you to sleep.  Limit screen use before bedtime. This includes:  Watching TV.  Using your smartphone, tablet, and computer.  Stick to a routine. This can help you fall asleep faster. Try to do a quiet activity, brush your teeth, and go to bed at the same time each night.  Get out of bed if you are still awake after 15 minutes of trying to sleep. Keep the lights down, but try reading or doing a quiet activity. When you feel sleepy, go back to bed.  Make sure that you drive carefully. Avoid driving if you feel very sleepy.  Keep all follow-up appointments as directed by your health care provider. This is important. SEEK MEDICAL CARE IF:   You are tired throughout the day or have trouble in your daily routine due to sleepiness.  You continue to have sleep problems or your sleep problems get worse. SEEK IMMEDIATE MEDICAL CARE IF:   You have serious thoughts about hurting yourself or someone else.   This information is not intended to replace advice given to you by your health care provider. Make sure you discuss any questions you have with your health care provider.   Document Released: 09/10/2000 Document Revised: 06/04/2015 Document Reviewed: 06/14/2014 Elsevier Interactive Patient Education 2016 [employer]   Cough, Adult Coughing is a reflex that clears your throat and your airways. Coughing helps to heal and protect your lungs. It is normal to cough occasionally, but a cough that happens with other symptoms or lasts a long time may be a sign of a condition that needs treatment. A cough may last only 2-3 weeks (acute), or it may last longer than 8 weeks (chronic). CAUSES Coughing is commonly caused by:  Breathing in substances that irritate your lungs.  A viral or bacterial respiratory infection.  Allergies.  Asthma.  Postnasal drip.  Smoking.  Acid backing up from the stomach into the  esophagus (gastroesophageal reflux).  Certain medicines.  Chronic lung problems, including COPD (or rarely, lung cancer).  Other medical conditions such as heart failure. HOME CARE INSTRUCTIONS  Pay attention to any changes in your symptoms. Take these actions to help with your discomfort:  Take medicines only as told by your health care provider.  If you were prescribed an antibiotic medicine, take it as told by your health care provider. Do not stop taking the antibiotic even if you start to feel better.  Talk with your health care provider before you take a cough suppressant medicine.  Drink enough fluid to keep your urine clear or pale yellow.  If the air is dry, use a cold steam vaporizer or humidifier in your bedroom or your home to help loosen secretions.  Avoid anything that causes you to cough at work or at home.  If your cough is worse at night, try sleeping in a semi-upright position.  Avoid cigarette smoke. If you smoke, quit smoking. If you need help quitting, ask your health care provider.  Avoid caffeine.  Avoid alcohol.  Rest as needed. SEEK MEDICAL CARE IF:   You have new symptoms.  You cough up pus.  Your cough does  not get better after 2-3 weeks, or your cough gets worse.  You cannot control your cough with suppressant medicines and you are losing sleep.  You develop pain that is getting worse or pain that is not controlled with pain medicines.  You have a fever.  You have unexplained weight loss.  You have night sweats. SEEK IMMEDIATE MEDICAL CARE IF:  You cough up blood.  You have difficulty breathing.  Your heartbeat is very fast.   This information is not intended to replace advice given to you by your health care provider. Make sure you discuss any questions you have with your health care provider.   Document Released: 03/12/2011 Document Revised: 06/04/2015 Document Reviewed: 11/20/2014 Elsevier Interactive Patient Education 2016  [employer].   IF you received an x-ray today, you will receive an invoice from [facility]. Please contact [facility] at [phone] with questions or concerns regarding your invoice.   IF you received labwork today, you will receive an invoice from [employer]. Please contact [facility] at [phone] with questions or concerns regarding your invoice.   Our billing staff will not be able to assist you with questions regarding bills from these companies.  You will be contacted with the lab results as soon as they are available. The fastest way to get your results is to activate your My Chart account. Instructions are located on the last page of this paperwork. If you have not heard from [name] regarding the results in 2 weeks, please contact this office.

## 2016-07-29 NOTE — Progress Notes (Signed)
By signing my name below, I, [name], attest that this documentation has been prepared under the direction and in the presence of [name], MD.  Electronically Signed: [name], Medical Scribe. 07/29/16. 10:45 AM.  Subjective:    Patient ID: Joseph Maldonado, male    DOB: December 30, 1945, 70 y.o.   MRN: [id number]  HPI Chief Complaint  Patient presents with  . medication    Ambien for sleep    HPI Comments: Joseph Maldonado is a 70 y.o. male with a PMHx of HTN with elevated CR, depression with episodic insomnia who presents to the [facility] for medication request on ambien. I do not see where this has been Rx recently. Last seen by me in March for annual medicare exam. Had black coffee this morning.   Insomnia: Pt's wife reads late and when she turns off the light it disturbs his sleep. Takes ambien 2.5g 2x a week for relief of his symptoms.  HTN: With elevated CR, indicating some component of CKD. Takes valsartan 160 mg BID. Walks around and does regular yard work. Pt has a lot of construction work (new kitchen, new breakfast room, and upstairs bathroom) in his house and has been cooking with the microwave. Denies experiencing negative side effects with  Lab Results  Component Value Date   CREATININE 1.47 (H) 12/17/2015   BP Readings from Last 3 Encounters:  07/29/16 130/74  07/07/16 124/68  12/17/15 130/72   Wt Readings from Last 3 Encounters:  07/29/16 210 lb (95.3 kg)  07/07/16 215 lb (97.5 kg)  12/17/15 217 lb (98.4 kg)   Cough: Seen by Dr. Mingo Amber for symptoms 07/07/16 Reports he has it every few years and this cough it triggered by the dust in his house. Tried codeine without relief of symptoms and recently got tessalon yesterday for symptoms.  HLD: Recheck in 6 months. Lab Results  Component Value Date   CHOL 208 (H) 12/17/2015   HDL 55 12/17/2015   LDLCALC 130 (H) 12/17/2015   TRIG 115 12/17/2015   CHOLHDL 3.8 12/17/2015   Lab  Results  Component Value Date   ALT 15 12/17/2015   AST 16 12/17/2015   ALKPHOS 81 12/17/2015   BILITOT 0.5 12/17/2015   Depression: Compliant with wellbutrin 200mg  BID. Worries about the [employer] fast streamer. Depression screen [facility] 2/9 07/29/2016 07/07/2016 12/17/2015 10/10/2015 02/03/2015  Decreased Interest 0 0 0 0 0  Down, Depressed, Hopeless 0 0 0 0 0  PHQ - 2 Score 0 0 0 0 0   There are no active problems to display for this patient.  Past Medical History:  Diagnosis Date  . Arthritis   . Chronic kidney disease   . Depression   . Hypertension    Past Surgical History:  Procedure Laterality Date  . JOINT REPLACEMENT     Allergies  Allergen Reactions  . Dilaudid [Hydromorphone Hcl]    Prior to Admission medications   Medication Sig Start Date End Date Taking? Authorizing Provider  acetaminophen (TYLENOL) 500 MG tablet Take 500 mg by mouth every 6 (six) hours as needed.    Historical Provider, MD  azelastine (ASTELIN) 0.1 % nasal spray Place 2 sprays into both nostrils 2 (two) times daily. Use in each nostril as directed 07/07/16   Alveda Reasons, MD  benzonatate (TESSALON) 100 MG capsule Take 1 capsule (100 mg total) by mouth 2 (two) times daily as needed for cough. 07/27/16   Alveda Reasons, MD  buPROPion [facility]  SR) 200 MG 12 hr tablet TAKE 1 TABLET TWICE A DAY 12/17/15   Wendie Agreste, MD  Guaifenesin-Codeine 200-10 MG/5ML LIQD Take 5 mLs by mouth 3 (three) times daily as needed. 07/07/16   Alveda Reasons, MD  meclizine (ANTIVERT) 25 MG tablet Take 25 mg by mouth 3 (three) times daily as needed for dizziness.    Historical Provider, MD  valsartan (DIOVAN) 160 MG tablet TAKE 1 TABLET DAILY 12/17/15   Wendie Agreste, MD   Social History   Social History  . Marital status: Married    Spouse name: N/A  . Number of children: N/A  . Years of education: N/A   Occupational History  . Retired    Social History Main Topics  . Smoking status: Former Research scientist (life sciences)  .  Smokeless tobacco: Not on file  . Alcohol use 2.4 oz/week    4 Standard drinks or equivalent per week  . Drug use: No  . Sexual activity: Not on file   Other Topics Concern  . Not on file   Social History Narrative   Married   Education: College   Exercise: No   Review of Systems  Constitutional: Negative for fatigue and unexpected weight change.  Eyes: Negative for visual disturbance.  Respiratory: Positive for cough. Negative for chest tightness and shortness of breath.   Cardiovascular: Negative for chest pain, palpitations and leg swelling.  Gastrointestinal: Negative for abdominal pain and blood in stool.  Neurological: Negative for dizziness, light-headedness and headaches.  Psychiatric/Behavioral: Positive for sleep disturbance. The patient is nervous/anxious (worried about construction).    Objective:  Physical Exam  Constitutional: He is oriented to person, place, and time. He appears well-developed and well-nourished.  HENT:  Head: Normocephalic and atraumatic.  Eyes: EOM are normal. Pupils are equal, round, and reactive to light.  Neck: No JVD present. Carotid bruit is not present.  Cardiovascular: Normal rate, regular rhythm and normal heart sounds.   No murmur heard. Pulmonary/Chest: Effort normal and breath sounds normal. He has no rales.  Musculoskeletal: He exhibits no edema.  Neurological: He is alert and oriented to person, place, and time.  Skin: Skin is warm and dry.  Psychiatric: He has a normal mood and affect.  Vitals reviewed.  BP 130/74 (BP Location: Left Arm, Cuff Size: Normal)   Pulse 62   Temp 98.1 F (36.7 C) (Oral)   Resp 16   Ht 5\' 11"  (1.803 m) Comment: per pt  Wt 210 lb (95.3 kg) Comment: per pt, stated he weighed this am  SpO2 99%   BMI 29.29 kg/m  Assessment & Plan:    Joseph Maldonado is a 70 y.o. male Hyperlipidemia, unspecified hyperlipidemia type - Plan: COMPLETE METABOLIC PANEL WITH GFR, Lipid panel  - Repeat lipid panel,  CMP.  Essential hypertension - Plan: COMPLETE METABOLIC PANEL WITH GFR, valsartan (DIOVAN) 160 MG tablet  - Stable, continue losartan same dose.  Depression, unspecified depression type  - Stable by report. Continue Wellbutrin same dose.  Insomnia  - as above appears to be situational with waking up from noise in room. Has tried one half dose of Ambien 5 mg without difficulty, and using approximately twice per week. Agreed on short-term prescription for Ambien at low dose of 0.5-1 tablet of 5 mg only as needed, but if persistent use/needed, return to discuss other treatments. Side effects and risks were discussed.  Meds ordered this encounter  Medications  . zolpidem (AMBIEN) 5 MG tablet    Sig:  Take 0.5-1 tablets (2.5-5 mg total) by mouth at bedtime as needed for sleep.    Dispense:  30 tablet    Refill:  0  . valsartan (DIOVAN) 160 MG tablet    Sig: TAKE 1 TABLET DAILY    Dispense:  90 tablet    Refill:  2  . buPROPion (WELLBUTRIN SR) 200 MG 12 hr tablet    Sig: TAKE 1 TABLET TWICE A DAY    Dispense:  180 tablet    Refill:  2   Patient Instructions    Short course of Ambien was prescribed if needed. If you do require this medication persistently or long-term, would like to look at other options that may be safer. No change in your other medications for now.   If the cough is not improving based on the treatment plan from Dr. Mingo Amber, follow-up in the next week or 2. Air purifier in the bedroom may help.  Return to the clinic or go to the nearest emergency room if any of your symptoms worsen or new symptoms occur.  Insomnia Insomnia is a sleep disorder that makes it difficult to fall asleep or to stay asleep. Insomnia can cause tiredness (fatigue), low energy, difficulty concentrating, mood swings, and poor performance at work or school.  There are three different ways to classify insomnia:  Difficulty falling asleep.  Difficulty staying asleep.  Waking up too early in the  morning. Any type of insomnia can be long-term (chronic) or short-term (acute). Both are common. Short-term insomnia usually lasts for three months or less. Chronic insomnia occurs at least three times a week for longer than three months. CAUSES  Insomnia may be caused by another condition, situation, or substance, such as:  Anxiety.  Certain medicines.  Gastroesophageal reflux disease (GERD) or other gastrointestinal conditions.  Asthma or other breathing conditions.  Restless legs syndrome, sleep apnea, or other sleep disorders.  Chronic pain.  Menopause. This may include hot flashes.  Stroke.  Abuse of alcohol, tobacco, or illegal drugs.  Depression.  Caffeine.   Neurological disorders, such as Alzheimer disease.  An overactive thyroid (hyperthyroidism). The cause of insomnia may not be known. RISK FACTORS Risk factors for insomnia include:  Gender. Women are more commonly affected than men.  Age. Insomnia is more common as you get older.  Stress. This may involve your professional or personal life.  Income. Insomnia is more common in people with lower income.  Lack of exercise.   Irregular work schedule or night shifts.  Traveling between different time zones. SIGNS AND SYMPTOMS If you have insomnia, trouble falling asleep or trouble staying asleep is the main symptom. This may lead to other symptoms, such as:  Feeling fatigued.  Feeling nervous about going to sleep.  Not feeling rested in the morning.  Having trouble concentrating.  Feeling irritable, anxious, or depressed. TREATMENT  Treatment for insomnia depends on the cause. If your insomnia is caused by an underlying condition, treatment will focus on addressing the condition. Treatment may also include:   Medicines to help you sleep.  Counseling or therapy.  Lifestyle adjustments. HOME CARE INSTRUCTIONS   Take medicines only as directed by your health care provider.  Keep regular  sleeping and waking hours. Avoid naps.  Keep a sleep diary to help you and your health care provider figure out what could be causing your insomnia. Include:   When you sleep.  When you wake up during the night.  How well you sleep.   How  rested you feel the next day.  Any side effects of medicines you are taking.  What you eat and drink.   Make your bedroom a comfortable place where it is easy to fall asleep:  Put up shades or special blackout curtains to block light from outside.  Use a white noise machine to block noise.  Keep the temperature cool.   Exercise regularly as directed by your health care provider. Avoid exercising right before bedtime.  Use relaxation techniques to manage stress. Ask your health care provider to suggest some techniques that may work well for you. These may include:  Breathing exercises.  Routines to release muscle tension.  Visualizing peaceful scenes.  Cut back on alcohol, caffeinated beverages, and cigarettes, especially close to bedtime. These can disrupt your sleep.  Do not overeat or eat spicy foods right before bedtime. This can lead to digestive discomfort that can make it hard for you to sleep.  Limit screen use before bedtime. This includes:  Watching TV.  Using your smartphone, tablet, and computer.  Stick to a routine. This can help you fall asleep faster. Try to do a quiet activity, brush your teeth, and go to bed at the same time each night.  Get out of bed if you are still awake after 15 minutes of trying to sleep. Keep the lights down, but try reading or doing a quiet activity. When you feel sleepy, go back to bed.  Make sure that you drive carefully. Avoid driving if you feel very sleepy.  Keep all follow-up appointments as directed by your health care provider. This is important. SEEK MEDICAL CARE IF:   You are tired throughout the day or have trouble in your daily routine due to sleepiness.  You continue to  have sleep problems or your sleep problems get worse. SEEK IMMEDIATE MEDICAL CARE IF:   You have serious thoughts about hurting yourself or someone else.   This information is not intended to replace advice given to you by your health care provider. Make sure you discuss any questions you have with your health care provider.   Document Released: 09/10/2000 Document Revised: 06/04/2015 Document Reviewed: 06/14/2014 Elsevier Interactive Patient Education 2016 Elsevier Inc.   Cough, Adult Coughing is a reflex that clears your throat and your airways. Coughing helps to heal and protect your lungs. It is normal to cough occasionally, but a cough that happens with other symptoms or lasts a long time may be a sign of a condition that needs treatment. A cough may last only 2-3 weeks (acute), or it may last longer than 8 weeks (chronic). CAUSES Coughing is commonly caused by:  Breathing in substances that irritate your lungs.  A viral or bacterial respiratory infection.  Allergies.  Asthma.  Postnasal drip.  Smoking.  Acid backing up from the stomach into the esophagus (gastroesophageal reflux).  Certain medicines.  Chronic lung problems, including COPD (or rarely, lung cancer).  Other medical conditions such as heart failure. HOME CARE INSTRUCTIONS  Pay attention to any changes in your symptoms. Take these actions to help with your discomfort:  Take medicines only as told by your health care provider.  If you were prescribed an antibiotic medicine, take it as told by your health care provider. Do not stop taking the antibiotic even if you start to feel better.  Talk with your health care provider before you take a cough suppressant medicine.  Drink enough fluid to keep your urine clear or pale yellow.  If the  air is dry, use a cold steam vaporizer or humidifier in your bedroom or your home to help loosen secretions.  Avoid anything that causes you to cough at work or at  home.  If your cough is worse at night, try sleeping in a semi-upright position.  Avoid cigarette smoke. If you smoke, quit smoking. If you need help quitting, ask your health care provider.  Avoid caffeine.  Avoid alcohol.  Rest as needed. SEEK MEDICAL CARE IF:   You have new symptoms.  You cough up pus.  Your cough does not get better after 2-3 weeks, or your cough gets worse.  You cannot control your cough with suppressant medicines and you are losing sleep.  You develop pain that is getting worse or pain that is not controlled with pain medicines.  You have a fever.  You have unexplained weight loss.  You have night sweats. SEEK IMMEDIATE MEDICAL CARE IF:  You cough up blood.  You have difficulty breathing.  Your heartbeat is very fast.   This information is not intended to replace advice given to you by your health care provider. Make sure you discuss any questions you have with your health care provider.   Document Released: 03/12/2011 Document Revised: 06/04/2015 Document Reviewed: 11/20/2014 Elsevier Interactive Patient Education 2016 [employer].   IF you received an x-ray today, you will receive an invoice from [facility]. Please contact [facility] at [phone] with questions or concerns regarding your invoice.   IF you received labwork today, you will receive an invoice from [employer]. Please contact [facility] at [phone] with questions or concerns regarding your invoice.   Our billing staff will not be able to assist you with questions regarding bills from these companies.  You will be contacted with the lab results as soon as they are available. The fastest way to get your results is to activate your My Chart account. Instructions are located on the last page of this paperwork. If you have not heard from [name] regarding the results in 2 weeks, please contact this office.       I personally  performed the services described in this documentation, which was scribed in my presence. The recorded information has been reviewed and considered, and addended by me as needed.   Signed,   [name], MD [facility].  07/29/16 12:10 PM

## 2016-07-31 ENCOUNTER — Encounter (HOSPITAL_COMMUNITY): Payer: Self-pay | Admitting: *Deleted

## 2016-07-31 ENCOUNTER — Ambulatory Visit (INDEPENDENT_AMBULATORY_CARE_PROVIDER_SITE_OTHER): Payer: Medicare Other

## 2016-07-31 ENCOUNTER — Ambulatory Visit (HOSPITAL_COMMUNITY)
Admission: EM | Admit: 2016-07-31 | Discharge: 2016-07-31 | Disposition: A | Discharge status: Home / Self Care | Payer: Medicare Other | Attending: Emergency Medicine | Admitting: Emergency Medicine

## 2016-07-31 DIAGNOSIS — R05 Cough: Secondary | ICD-10-CM

## 2016-07-31 DIAGNOSIS — R059 Cough, unspecified: Secondary | ICD-10-CM

## 2016-07-31 MED ORDER — PREDNISONE 50 MG PO TABS: ORAL_TABLET | ORAL | 0 refills | Status: DC

## 2016-07-31 NOTE — ED Triage Notes (Signed)
Patient reports 3-4 progressive cough, at first was dry and now he is having a productive cough. States he has been taking tessalon pearls to help.

## 2016-07-31 NOTE — Discharge Instructions (Signed)
Your x-ray is clear. I suspect your cough is coming from inflammation from the construction dust. Take prednisone daily for 5 days. The cough may take a while to fully resolve, especially if there is still construction dust in the house. Follow-up as needed.

## 2016-07-31 NOTE — ED Provider Notes (Signed)
[facility]    CSN: [id number] Arrival date & time: 07/31/16  1632     History   Chief Complaint Chief Complaint  Patient presents with  . Cough  . Fever    HPI Or Rabelo is a 70 y.o. male.   HPI Patient is a 70 y.o. male who presents to UC c/o cough x 4 weeks.  Went to urgent care beginning of October for dry cough.  Patient given Codeine cough syrup without much relief. Cough has since turned productive of clear sputum. Cough is worse during the day and does not keep him up at night. Called urgent care shortly after initial visit and requested Tessalon pearls. Is still taking tessalon pearls without relief, is now out of cough syrup. Also taking antihistamine twice a day, unsure of name.  Reports hx of dry cough every few years that usually resolves on own with tessalon pearls. Notes that his house is under construction and there is dust everywhere which he suspects is causing irritation.  Denies hx of allergies. Denies recent chest xray.  Cessation of smoking at age 86.  Denies any other symptoms such as fever, congestion, rhinorrhea, sore throat, otalgia, N/V/D.  Denies chest pain and SOB.   Past Medical History:  Diagnosis Date  . Arthritis   . Chronic kidney disease   . Depression   . Hypertension     There are no active problems to display for this patient.   Past Surgical History:  Procedure Laterality Date  . JOINT REPLACEMENT       Home Medications    Prior to Admission medications   Medication Sig Start Date End Date Taking? Authorizing Provider  acetaminophen (TYLENOL) 500 MG tablet Take 500 mg by mouth every 6 (six) hours as needed.   Yes Historical Provider, MD  azelastine (ASTELIN) 0.1 % nasal spray Place 2 sprays into both nostrils 2 (two) times daily. Use in each nostril as directed 07/07/16  Yes Alveda Reasons, MD  benzonatate (TESSALON) 100 MG capsule Take 1 capsule (100 mg total) by mouth 2 (two) times daily as needed for cough.  07/27/16  Yes Alveda Reasons, MD  buPROPion [facility] SR) 200 MG 12 hr tablet TAKE 1 TABLET TWICE A DAY 07/29/16  Yes Wendie Agreste, MD  valsartan (DIOVAN) 160 MG tablet TAKE 1 TABLET DAILY 07/29/16  Yes Wendie Agreste, MD  zolpidem (AMBIEN) 5 MG tablet Take 0.5-1 tablets (2.5-5 mg total) by mouth at bedtime as needed for sleep. 07/29/16  Yes Wendie Agreste, MD  Guaifenesin-Codeine 200-10 MG/5ML LIQD Take 5 mLs by mouth 3 (three) times daily as needed. 07/07/16   Alveda Reasons, MD  meclizine (ANTIVERT) 25 MG tablet Take 25 mg by mouth 3 (three) times daily as needed for dizziness.    Historical Provider, MD  predniSONE (DELTASONE) 50 MG tablet Take 1 pill daily for 5 days. 07/31/16   Melony Overly, MD    Family History Family History  Problem Relation Age of Onset  . Cancer Mother     throat  . Hypertension Mother   . Heart disease Mother   . Hypertension Father   . Cancer Father     Brain  . Hypertension Sister     Social History Social History  Substance Use Topics  . Smoking status: Former Research scientist (life sciences)  . Smokeless tobacco: Not on file  . Alcohol use 2.4 oz/week    4 Standard drinks or equivalent per week  Allergies   Dilaudid [hydromorphone hcl]   Review of Systems Review of Systems  See HPI  Physical Exam Triage Vital Signs ED Triage Vitals [07/31/16 1805]  Enc Vitals Group     BP 135/81     Pulse Rate 73     Resp 19     Temp 98.7 F (37.1 C)     Temp Source Oral     SpO2 100 %     Weight      Height      Head Circumference      Peak Flow      Pain Score      Pain Loc      Pain Edu?      Excl. in [place]?    No data found.   Updated Vital Signs BP 135/81 (BP Location: Left Arm)   Pulse 73   Temp 98 F (36.7 C) (Oral)   Resp 19   SpO2 100%    Physical Exam  Constitutional: He is oriented to person, place, and time. He appears well-developed and well-nourished.  HENT:  Head: Normocephalic and atraumatic.  Right Ear: Tympanic membrane,  external ear and ear canal normal.  Left Ear: Tympanic membrane, external ear and ear canal normal.  Nose: Nose normal. No mucosal edema, rhinorrhea or sinus tenderness. Right sinus exhibits no maxillary sinus tenderness and no frontal sinus tenderness. Left sinus exhibits no maxillary sinus tenderness and no frontal sinus tenderness.  Mouth/Throat: Uvula is midline, oropharynx is clear and moist and mucous membranes are normal. No tonsillar exudate.  Eyes: Conjunctivae and EOM are normal. Right eye exhibits no discharge. Left eye exhibits no discharge.  Neck: Normal range of motion. Neck supple.  Cardiovascular: Normal rate and regular rhythm.   Pulmonary/Chest: Effort normal and breath sounds normal. No respiratory distress. He has no wheezes. He has no rales. He exhibits no tenderness.  Musculoskeletal: Normal range of motion.  Lymphadenopathy:    He has no cervical adenopathy.  Neurological: He is alert and oriented to person, place, and time.  Skin: Skin is warm and dry. No rash noted. No erythema.  Psychiatric: He has a normal mood and affect. His behavior is normal.  Nursing note and vitals reviewed.    UC Treatments / Results  Labs (all labs ordered are listed, but only abnormal results are displayed) Labs Reviewed - No data to display  EKG  EKG Interpretation None       Radiology Dg Chest 2 View  Result Date: 07/31/2016 CLINICAL DATA:  Cough for 2 weeks.  Nonsmoker. EXAM: CHEST  2 VIEW COMPARISON:  None. FINDINGS: Mild hyperinflation. Moderate thoracic spondylosis. Midline trachea. Normal heart size and mediastinal contours. No pleural effusion or pneumothorax. Clear lungs. Nodular density projecting over the anterior chest on the lateral view is likely calcified and may represent a bone island. Not localized on the frontal radiograph. IMPRESSION: Hyperinflation, without acute disease. Electronically Signed   By: Abigail Miyamoto M.D.   On: 07/31/2016 19:10     Procedures Procedures (including critical care time)  Medications Ordered in UC Medications - No data to display   Initial Impression / Assessment and Plan / UC Course  I have reviewed the triage vital signs and the nursing notes.  Pertinent labs & imaging results that were available during my care of the patient were reviewed by me and considered in my medical decision making (see chart for details).  Clinical Course   70 y.o. male presents to UC c/o  cough x 4 weeks which has recently turned productive of clear sputum.  2 view chest x-ray:  No acute disease.    X-ray showed nodular density projecting over anterior chest likely calcified and may represent bone island.  Patient already aware of this and being followed.   Suspect cough is coming from inflammation from the construction dust.   Prednisone 50 mg tab x 5 days prescribed at today's visit.  Advised that cough may take a while to fully resolve, especially if still construction dust in the house.    Follow up as needed.  Final Clinical Impressions(s) / UC Diagnoses   Final diagnoses:  Cough    New Prescriptions New Prescriptions   PREDNISONE (DELTASONE) 50 MG TABLET    Take 1 pill daily for 5 days.     Melony Overly, MD 07/31/16 2041

## 2016-08-04 ENCOUNTER — Ambulatory Visit (INDEPENDENT_AMBULATORY_CARE_PROVIDER_SITE_OTHER): Payer: Medicare Other | Admitting: Family Medicine

## 2016-08-04 VITALS — BP 122/72 | HR 78 | Temp 98.6°F | Resp 17 | Ht 71.0 in | Wt 210.0 lb

## 2016-08-04 DIAGNOSIS — R05 Cough: Secondary | ICD-10-CM | POA: Diagnosis not present

## 2016-08-04 DIAGNOSIS — R053 Chronic cough: Secondary | ICD-10-CM

## 2016-08-04 MED ORDER — AMOXICILLIN 875 MG PO TABS: 875.0000 mg | ORAL_TABLET | Freq: Two times a day (BID) | ORAL | 0 refills | Status: DC

## 2016-08-04 NOTE — Progress Notes (Signed)
   Joseph Maldonado is a 70 y.o. male who presents to [facility] today for persistent cough:  1.  Persistent cough:  Patient has had cough now for about 4 weeks. 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 He has been treated with guaifenesin/codeine as well as Best boy. Due to persistence of cough he went to another urgent care this past Sunday and was given 5 day course of prednisone. He does not feel any of this is really help his cough except for the Tessalon perles he did start over-the-counter cetirizine and has been taking this regularly. This has helped some as well.  He denies any GERD symptoms. No reflux. Starting yesterday he began having URI symptoms. With accompanying nasal drainage and postnasal drip. However prior to that she denies any postnasal drip symptoms. He also denies any asthma symptoms. States he was given PFTs while still in [place] and past 2-3 years and these were negative for asthma.  His cough is mostly dry and hacking though recently has been productive of white sputum. He's had no fevers or chills. It is not worse at night and does not keep him awake at night. The constructional his house is scheduled in the next 2 weeks.  ROS as above.    PMH reviewed. Patient is a nonsmoker.   Past Medical History:  Diagnosis Date  . Arthritis   . Chronic kidney disease   . Depression   . Hypertension    Past Surgical History:  Procedure Laterality Date  . JOINT REPLACEMENT      Medications reviewed.  Physical Exam:  BP 122/72 (BP Location: Right Arm, Patient Position: Sitting, Cuff Size: Normal)   Pulse 78   Temp 98.6 F (37 C) (Oral)   Resp 17   Ht 5\' 11"  (1.803 m)   Wt 210 lb (95.3 kg)   SpO2 97%   BMI 29.29 kg/m  Gen:  Alert, cooperative patient who appears stated age in no acute distress.  Vital signs reviewed. Head: [place]/AT.   Eyes:  EOMI, PERRL.   Ears:  External ears WNL,  Bilateral TM's normal without retraction, redness or bulging. Nose:  Septum midline  Mouth:  MMM, tonsils non-erythematous, non-edematous.   Pulm:  Clear to auscultation bilaterally.  No wheezing  Cardiac:  Regular rate and rhythm  Ext:  No LE edema.  Assessment and Plan:  1.  Chronic cough: - No history of asthma, GERD, postnasal drip. -CC view most likely secondary to environment allergens/triggers -He is to continue using Tessalon Perles and over-the-counter cetirizine as this is been improving his cough. -He has started having URI symptoms as of yesterday. Due to his relative immunocompromisation with prednisone use as well as this persistent cough this may be more likely to develop into a bacterial URI. I prescribed him amoxicillin to pick up later this week. Is still having symptoms. -Chest x-ray 4 days ago was negative for any acute process. -If he still having cough symptoms by the end of this month able up in 2 months since he was first diagnosed at that point we should consider pulmonology referral.

## 2016-08-04 NOTE — Patient Instructions (Addendum)
I am sorry you are still struggling with her cough.  I have sent in amoxicillin for you to take twice daily for the next 7 days. You can pick this up on Friday for still having issues with upper respiratory symptoms.  Refill the Tessalon pearls and keep taking the over-the-counter antihistamine.  If your still having issues by Thanksgiving or the week after that it will have been 2 months and we'll need to be talking about a pulmonology referral. Let me know at that time we can put that referral in.  Habits Thanksgiving.    IF you received an x-ray today, you will receive an invoice from [facility]. Please contact [facility] at [phone] with questions or concerns regarding your invoice.   IF you received labwork today, you will receive an invoice from [employer]. Please contact [facility] at [phone] with questions or concerns regarding your invoice.   Our billing staff will not be able to assist you with questions regarding bills from these companies.  You will be contacted with the lab results as soon as they are available. The fastest way to get your results is to activate your My Chart account. Instructions are located on the last page of this paperwork. If you have not heard from [name] regarding the results in 2 weeks, please contact this office.

## 2016-08-24 ENCOUNTER — Ambulatory Visit (INDEPENDENT_AMBULATORY_CARE_PROVIDER_SITE_OTHER): Payer: Medicare Other | Admitting: Family Medicine

## 2016-08-24 VITALS — BP 148/84 | HR 88 | Temp 98.7°F | Resp 17 | Ht 70.0 in | Wt 215.0 lb

## 2016-08-24 DIAGNOSIS — M7989 Other specified soft tissue disorders: Secondary | ICD-10-CM

## 2016-08-24 DIAGNOSIS — R05 Cough: Secondary | ICD-10-CM | POA: Diagnosis not present

## 2016-08-24 DIAGNOSIS — R058 Other specified cough: Secondary | ICD-10-CM

## 2016-08-24 DIAGNOSIS — R059 Cough, unspecified: Secondary | ICD-10-CM

## 2016-08-24 MED ORDER — BENZONATATE 100 MG PO CAPS: 100.0000 mg | ORAL_CAPSULE | Freq: Two times a day (BID) | ORAL | 1 refills | Status: DC | PRN

## 2016-08-24 NOTE — Patient Instructions (Addendum)
You likely have upper airway cough syndrome. As we discussed it can be due to a number of factors, but common factors may be allergies, irritants in the air, or reflux/heartburn. Restart Zyrtec 10 mg once per day, Zantac over-the-counter twice per day. Avoid foods below that can be associated with heartburn. Air purifier in the room where you sleep. As construction is ending, cough should improve if from air irritants. Tessalon Perles if needed up to 3 times per day.   If your cough does improve over the next 2 weeks, you can stop the Zantac first, or zyrtec,  but if cough returns, restart those medications. If cough continues to resolve over the next 2 weeks can stop both medications.   If still having cough in 2-4 weeks time, let me know and I will refer you to pulmonary.    If the redness on your foot is improving the next few days, no further evaluation or treatment may be needed. It is possible that may have been a mild gout flare. If any spread of redness, return of pain, or worsening symptoms, return for recheck as cellulitis or infection is also possible   Food Choices for Gastroesophageal Reflux Disease, Adult When you have gastroesophageal reflux disease (GERD), the foods you eat and your eating habits are very important. Choosing the right foods can help ease the discomfort of GERD. What general guidelines do I need to follow?  Choose fruits, vegetables, whole grains, low-fat dairy products, and low-fat meat, fish, and poultry.  Limit fats such as oils, salad dressings, butter, nuts, and avocado.  Keep a food diary to identify foods that cause symptoms.  Avoid foods that cause reflux. These may be different for different people.  Eat frequent small meals instead of three large meals each day.  Eat your meals slowly, in a relaxed setting.  Limit fried foods.  Cook foods using methods other than frying.  Avoid drinking alcohol.  Avoid drinking large amounts of liquids with  your meals.  Avoid bending over or lying down until 2-3 hours after eating. What foods are not recommended? The following are some foods and drinks that may worsen your symptoms: Vegetables  Tomatoes. Tomato juice. Tomato and spaghetti sauce. Chili peppers. Onion and garlic. Horseradish. Fruits  Oranges, grapefruit, and lemon (fruit and juice). Meats  High-fat meats, fish, and poultry. This includes hot dogs, ribs, ham, sausage, salami, and bacon. Dairy  Whole milk and chocolate milk. Sour cream. Cream. Butter. Ice cream. Cream cheese. Beverages  Coffee and tea, with or without caffeine. Carbonated beverages or energy drinks. Condiments  Hot sauce. Barbecue sauce. Sweets/Desserts  Chocolate and cocoa. Donuts. Peppermint and spearmint. Fats and Oils  High-fat foods, including [place] fries and potato chips. Other  Vinegar. Strong spices, such as black pepper, white pepper, red pepper, cayenne, curry powder, cloves, ginger, and chili powder. The items listed above may not be a complete list of foods and beverages to avoid. Contact your dietitian for more information.  This information is not intended to replace advice given to you by your health care provider. Make sure you discuss any questions you have with your health care provider. Document Released: 09/13/2005 Document Revised: 02/19/2016 Document Reviewed: 07/18/2013 Elsevier Interactive Patient Education  2017 [employer].   IF you received an x-ray today, you will receive an invoice from [facility]. Please contact [facility] at [phone] with questions or concerns regarding your invoice.   IF you received labwork today, you will receive an invoice  from [employer]. Please contact [facility] at [phone] with questions or concerns regarding your invoice.   Our billing staff will not be able to assist you with questions regarding bills from these companies.  You will be  contacted with the lab results as soon as they are available. The fastest way to get your results is to activate your My Chart account. Instructions are located on the last page of this paperwork. If you have not heard from [name] regarding the results in 2 weeks, please contact this office.

## 2016-08-24 NOTE — Progress Notes (Signed)
By signing my name below, I, Joseph Maldonado, attest that this documentation has been prepared under the direction and in the presence of Joseph Ray, MD.  Electronically Signed: Verlee Maldonado, Medical Scribe. 08/24/16. 10:32 AM.  Subjective:    Patient ID: Joseph Maldonado, male    DOB: 12/19/1945, 70 y.o.   MRN: [id number]  HPI Chief Complaint  Patient presents with  . Cough    x 6 weeks     HPI Comments: Joseph Maldonado is a 70 y.o. male who presents to the [facility] complaining of dry cough for the past 6 weeks. He was seen 11/8 with a chronic cough at that time, all for 4 weeks at that time. Initially seen after dust exposure during construction. Was treated with 5 day course of prednisone with minimal assistance, some improvement with tessalon and zyrtec. Denies asthma, GERD, and PND sxs. Thought to be environmental triggers. He was treated with amoxicillin, he had a neg CXR 11/4.  Cough: Reports congestion, rhinorrhea with his dry cough. The construction in his house is almost done. Mentions he switched over to robitussin after running out of tessalon perles 2 weeks ago. Takes robitussin for relief, nyquil, dayquil for relif, and zyrtec for releif but hasn't taken it in a day. Mention robitussin and dayquil upsets his stomach. Pt had heartburn once and suspects it was from his menthol cough drops. Denies fevers, SOB, sleep disturbance, and recently experincing heartburn.   Right Foot: Reports consistent redness and swelling on top of right foot for the past 3-4 days. Pt's had some slight pain there initially, but no pain now. Took tylenol for his sxs. He's had gout in the past but he doesn't think it's gout. Denies injury to foot, fevers, and chills.  There are no active problems to display for this patient.  Past Medical History:  Diagnosis Date  . Arthritis   . Chronic kidney disease   . Depression   . Hypertension    Past Surgical History:  Procedure  Laterality Date  . JOINT REPLACEMENT     Allergies  Allergen Reactions  . Dilaudid [Hydromorphone Hcl]    Prior to Admission medications   Medication Sig Start Date End Date Taking? Authorizing Provider  acetaminophen (TYLENOL) 500 MG tablet Take 500 mg by mouth every 6 (six) hours as needed.   Yes Historical Provider, MD  azelastine (ASTELIN) 0.1 % nasal spray Place 2 sprays into both nostrils 2 (two) times daily. Use in each nostril as directed 07/07/16  Yes Alveda Reasons, MD  benzonatate (TESSALON) 100 MG capsule Take 1 capsule (100 mg total) by mouth 2 (two) times daily as needed for cough. 07/27/16  Yes Alveda Reasons, MD  buPROPion [facility] SR) 200 MG 12 hr tablet TAKE 1 TABLET TWICE A DAY 07/29/16  Yes Wendie Agreste, MD  valsartan (DIOVAN) 160 MG tablet TAKE 1 TABLET DAILY 07/29/16  Yes Wendie Agreste, MD  zolpidem (AMBIEN) 5 MG tablet Take 0.5-1 tablets (2.5-5 mg total) by mouth at bedtime as needed for sleep. 07/29/16  Yes Wendie Agreste, MD  meclizine (ANTIVERT) 25 MG tablet Take 25 mg by mouth 3 (three) times daily as needed for dizziness.    Historical Provider, MD   Social History   Social History  . Marital status: Married    Spouse name: N/A  . Number of children: N/A  . Years of education: N/A   Occupational History  . Retired    Science writer  History Main Topics  . Smoking status: Former Research scientist (life sciences)  . Smokeless tobacco: Never Used  . Alcohol use 2.4 oz/week    4 Standard drinks or equivalent per week  . Drug use: No  . Sexual activity: No   Other Topics Concern  . Not on file   Social History Narrative   Married   Education: College   Exercise: No   Review of Systems  Constitutional: Negative for chills, fever and unexpected weight change.  HENT: Positive for congestion and rhinorrhea.   Respiratory: Positive for cough. Negative for shortness of breath.   Psychiatric/Behavioral: Negative for sleep disturbance.   Objective:  Physical Exam    Constitutional: He is oriented to person, place, and time. He appears well-developed and well-nourished.  HENT:  Head: Normocephalic and atraumatic.  Nose: No rhinorrhea.  Mouth/Throat: Oropharynx is clear and moist and mucous membranes are normal. No oropharyngeal exudate or posterior oropharyngeal erythema.  Eyes: Conjunctivae are normal. Pupils are equal, round, and reactive to light.  Neck: Neck supple.  Cardiovascular: Normal rate, regular rhythm, normal heart sounds and intact distal pulses.  Exam reveals no friction rub.   No murmur heard. Pulmonary/Chest: Effort normal and breath sounds normal. No respiratory distress. He has no wheezes. He has no rhonchi. He has no rales.  Abdominal: Soft. There is no tenderness.  Lymphadenopathy:    He has no cervical adenopathy.  Neurological: He is alert and oriented to person, place, and time.  Skin: Skin is warm, dry and intact. No rash noted. There is erythema.  4x5 cm slight erythematous patch on dorsum of right foot, slight soft tissue swelling there Skin intact inbetween toes, no wounds  Psychiatric: He has a normal mood and affect. His behavior is normal.  Vitals reviewed.  BP (!) 148/84 (BP Location: Right Arm, Patient Position: Sitting, Cuff Size: Normal)   Pulse 88   Temp 98.7 F (37.1 C) (Oral)   Resp 17   Ht 5\' 10"  (1.778 m)   Wt 215 lb (97.5 kg)   SpO2 97%   BMI 30.85 kg/m  Assessment & Plan:   Joseph Maldonado is a 70 y.o. male Cough - Plan: benzonatate (TESSALON) 100 MG capsule Upper airway cough syndrome - Plan: benzonatate (TESSALON) 100 MG capsule  -Likely upper airway cough syndrome due to household your tent versus allergic component with postnasal drip. Less likely reflux/laryngeal pharyngeal reflux.  -Tessalon Perles refilled, restart Zyrtec, start Zantac over-the-counter, and can discontinue one of the 2 in the next 2 weeks if his symptoms have improved. If symptoms recur, restart that medication.   -if still not  improving in the next few weeks, consider pulmonary eval. Sooner if worse.   Foot swelling  - Initial pain, now slight swelling and redness. Nontender. No sign of wound or apparent infection. Suspicious for gout, but as somewhat improved will hold on specific treatments for now, and monitor for changes. Outlined area of erythema for patient to monitor. RTC if any spread of redness or pain returns.    Meds ordered this encounter  Medications  . benzonatate (TESSALON) 100 MG capsule    Sig: Take 1 capsule (100 mg total) by mouth 2 (two) times daily as needed for cough.    Dispense:  30 capsule    Refill:  1   Patient Instructions    You likely have upper airway cough syndrome. As we discussed it can be due to a number of factors, but common factors may be allergies, irritants in  the air, or reflux/heartburn. Restart Zyrtec 10 mg once per day, Zantac over-the-counter twice per day. Avoid foods below that can be associated with heartburn. Air purifier in the room where you sleep. As construction is ending, cough should improve if from air irritants. Tessalon Perles if needed up to 3 times per day.   If your cough does improve over the next 2 weeks, you can stop the Zantac first, or zyrtec,  but if cough returns, restart those medications. If cough continues to resolve over the next 2 weeks can stop both medications.   If still having cough in 2-4 weeks time, let me know and I will refer you to pulmonary.    If the redness on your foot is improving the next few days, no further evaluation or treatment may be needed. It is possible that may have been a mild gout flare. If any spread of redness, return of pain, or worsening symptoms, return for recheck as cellulitis or infection is also possible   Food Choices for Gastroesophageal Reflux Disease, Adult When you have gastroesophageal reflux disease (GERD), the foods you eat and your eating habits are very important. Choosing the right foods can  help ease the discomfort of GERD. What general guidelines do I need to follow?  Choose fruits, vegetables, whole grains, low-fat dairy products, and low-fat meat, fish, and poultry.  Limit fats such as oils, salad dressings, butter, nuts, and avocado.  Keep a food diary to identify foods that cause symptoms.  Avoid foods that cause reflux. These may be different for different people.  Eat frequent small meals instead of three large meals each day.  Eat your meals slowly, in a relaxed setting.  Limit fried foods.  Cook foods using methods other than frying.  Avoid drinking alcohol.  Avoid drinking large amounts of liquids with your meals.  Avoid bending over or lying down until 2-3 hours after eating. What foods are not recommended? The following are some foods and drinks that may worsen your symptoms: Vegetables  Tomatoes. Tomato juice. Tomato and spaghetti sauce. Chili peppers. Onion and garlic. Horseradish. Fruits  Oranges, grapefruit, and lemon (fruit and juice). Meats  High-fat meats, fish, and poultry. This includes hot dogs, ribs, ham, sausage, salami, and bacon. Dairy  Whole milk and chocolate milk. Sour cream. Cream. Butter. Ice cream. Cream cheese. Beverages  Coffee and tea, with or without caffeine. Carbonated beverages or energy drinks. Condiments  Hot sauce. Barbecue sauce. Sweets/Desserts  Chocolate and cocoa. Donuts. Peppermint and spearmint. Fats and Oils  High-fat foods, including [place] fries and potato chips. Other  Vinegar. Strong spices, such as black pepper, white pepper, red pepper, cayenne, curry powder, cloves, ginger, and chili powder. The items listed above may not be a complete list of foods and beverages to avoid. Contact your dietitian for more information.  This information is not intended to replace advice given to you by your health care provider. Make sure you discuss any questions you have with your health care provider. Document  Released: 09/13/2005 Document Revised: 02/19/2016 Document Reviewed: 07/18/2013 Elsevier Interactive Patient Education  2017 [employer].   IF you received an x-Maldonado today, you will receive an invoice from [facility]. Please contact [facility] at [phone] with questions or concerns regarding your invoice.   IF you received labwork today, you will receive an invoice from [employer]. Please contact [facility] at [phone] with questions or concerns regarding your invoice.   Our billing staff will not be able  to assist you with questions regarding bills from these companies.  You will be contacted with the lab results as soon as they are available. The fastest way to get your results is to activate your My Chart account. Instructions are located on the last page of this paperwork. If you have not heard from [name] regarding the results in 2 weeks, please contact this office.        I personally performed the services described in this documentation, which was scribed in my presence. The recorded information has been reviewed and considered, and addended by me as needed.   Signed,   Joseph Ray, MD [facility].  08/25/16 3:12 PM

## 2016-09-03 DIAGNOSIS — H5203 Hypermetropia, bilateral: Secondary | ICD-10-CM | POA: Diagnosis not present

## 2016-09-03 DIAGNOSIS — H524 Presbyopia: Secondary | ICD-10-CM | POA: Diagnosis not present

## 2016-09-03 DIAGNOSIS — H1850 Unspecified hereditary corneal dystrophies: Secondary | ICD-10-CM | POA: Diagnosis not present

## 2016-09-03 DIAGNOSIS — H2513 Age-related nuclear cataract, bilateral: Secondary | ICD-10-CM | POA: Diagnosis not present

## 2016-09-03 DIAGNOSIS — H52223 Regular astigmatism, bilateral: Secondary | ICD-10-CM | POA: Diagnosis not present

## 2016-09-03 DIAGNOSIS — H35033 Hypertensive retinopathy, bilateral: Secondary | ICD-10-CM | POA: Diagnosis not present

## 2016-09-05 ENCOUNTER — Ambulatory Visit (HOSPITAL_COMMUNITY)
Admission: EM | Admit: 2016-09-05 | Discharge: 2016-09-05 | Disposition: A | Discharge status: Home / Self Care | Payer: Medicare Other | Attending: Emergency Medicine | Admitting: Emergency Medicine

## 2016-09-05 ENCOUNTER — Encounter (HOSPITAL_COMMUNITY): Payer: Self-pay | Admitting: Emergency Medicine

## 2016-09-05 ENCOUNTER — Ambulatory Visit (INDEPENDENT_AMBULATORY_CARE_PROVIDER_SITE_OTHER): Payer: Medicare Other

## 2016-09-05 DIAGNOSIS — W19XXXA Unspecified fall, initial encounter: Secondary | ICD-10-CM

## 2016-09-05 DIAGNOSIS — S76012A Strain of muscle, fascia and tendon of left hip, initial encounter: Secondary | ICD-10-CM | POA: Diagnosis not present

## 2016-09-05 DIAGNOSIS — M25552 Pain in left hip: Secondary | ICD-10-CM | POA: Diagnosis not present

## 2016-09-05 DIAGNOSIS — S79912A Unspecified injury of left hip, initial encounter: Secondary | ICD-10-CM | POA: Diagnosis not present

## 2016-09-05 NOTE — ED Provider Notes (Signed)
CSN: [id number]     Arrival date & time 09/05/16  1518 History   First MD Initiated Contact with Patient 09/05/16 1541     Chief Complaint  Patient presents with  . Fall   (Consider location/radiation/quality/duration/timing/severity/associated sxs/prior Treatment) Patient fell onto his right side this am and he has pain in his left hip.  He has hx of hip replacement.      Fall     Past Medical History:  Diagnosis Date  . Arthritis   . Chronic kidney disease   . Depression   . Hypertension    Past Surgical History:  Procedure Laterality Date  . JOINT REPLACEMENT     Family History  Problem Relation Age of Onset  . Cancer Mother     throat  . Hypertension Mother   . Heart disease Mother   . Hypertension Father   . Cancer Father     Brain  . Hypertension Sister    Social History  Substance Use Topics  . Smoking status: Former Research scientist (life sciences)  . Smokeless tobacco: Never Used  . Alcohol use 2.4 oz/week    4 Standard drinks or equivalent per week     Comment: rarely    Review of Systems  Constitutional: Negative.   HENT: Negative.   Eyes: Negative.   Respiratory: Negative.   Cardiovascular: Negative.   Endocrine: Negative.   Genitourinary: Negative.   Musculoskeletal: Positive for joint swelling.  Skin: Negative.   Allergic/Immunologic: Negative.   Neurological: Negative.   Hematological: Negative.   Psychiatric/Behavioral: Negative.     Allergies  Dilaudid [hydromorphone hcl]  Home Medications   Prior to Admission medications   Medication Sig Start Date End Date Taking? Authorizing Provider  acetaminophen (TYLENOL) 500 MG tablet Take 500 mg by mouth every 6 (six) hours as needed.    Historical Provider, MD  azelastine (ASTELIN) 0.1 % nasal spray Place 2 sprays into both nostrils 2 (two) times daily. Use in each nostril as directed 07/07/16   Alveda Reasons, MD  benzonatate (TESSALON) 100 MG capsule Take 1 capsule (100 mg total) by mouth 2 (two) times  daily as needed for cough. 08/24/16   Wendie Agreste, MD  buPROPion [facility] SR) 200 MG 12 hr tablet TAKE 1 TABLET TWICE A DAY 07/29/16   Wendie Agreste, MD  meclizine (ANTIVERT) 25 MG tablet Take 25 mg by mouth 3 (three) times daily as needed for dizziness.    Historical Provider, MD  valsartan (DIOVAN) 160 MG tablet TAKE 1 TABLET DAILY 07/29/16   Wendie Agreste, MD  zolpidem (AMBIEN) 5 MG tablet Take 0.5-1 tablets (2.5-5 mg total) by mouth at bedtime as needed for sleep. 07/29/16   Wendie Agreste, MD   Meds Ordered and Administered this Visit  Medications - No data to display  BP 127/82 (BP Location: Left Arm)   Pulse 76   Temp 98.2 F (36.8 C) (Oral)   Resp 16   Ht 5\' 11"  (1.803 m)   Wt 210 lb (95.3 kg)   SpO2 99%   BMI 29.29 kg/m  No data found.   Physical Exam  Constitutional: He appears well-developed and well-nourished.  HENT:  Head: Normocephalic and atraumatic.  Eyes: EOM are normal. Pupils are equal, round, and reactive to light.  Neck: Normal range of motion. Neck supple.  Cardiovascular: Normal rate, regular rhythm and normal heart sounds.   Pulmonary/Chest: Effort normal.  Musculoskeletal: He exhibits tenderness.  TTP left trochanter and pain with flexion  of left hip.   Nursing note and vitals reviewed.   Urgent Care Course   Clinical Course     Procedures (including critical care time)  Labs Review Labs Reviewed - No data to display  Imaging Review No results found.   Visual Acuity Review  Right Eye Distance:   Left Eye Distance:   Bilateral Distance:    Right Eye Near:   Left Eye Near:    Bilateral Near:         MDM  Left hip pain/strain Fall  Reassurance given to patient that he has no fracture or injury to his arthroplasty left hip. Take tylenol otc as directed for discomfort.    Lysbeth Penner, FNP 09/05/16 [phone]

## 2016-09-05 NOTE — ED Triage Notes (Signed)
PT fell onto right hip this AM. PT is having severe pain in left hip where he has a hip replacement. PT is ambulatory

## 2016-09-08 DIAGNOSIS — L821 Other seborrheic keratosis: Secondary | ICD-10-CM | POA: Diagnosis not present

## 2016-09-08 DIAGNOSIS — L814 Other melanin hyperpigmentation: Secondary | ICD-10-CM | POA: Diagnosis not present

## 2016-09-08 DIAGNOSIS — L739 Follicular disorder, unspecified: Secondary | ICD-10-CM | POA: Diagnosis not present

## 2016-09-08 DIAGNOSIS — L570 Actinic keratosis: Secondary | ICD-10-CM | POA: Diagnosis not present

## 2016-09-08 DIAGNOSIS — Z85828 Personal history of other malignant neoplasm of skin: Secondary | ICD-10-CM | POA: Diagnosis not present

## 2016-09-15 ENCOUNTER — Ambulatory Visit (INDEPENDENT_AMBULATORY_CARE_PROVIDER_SITE_OTHER): Payer: Medicare Other | Admitting: Family Medicine

## 2016-09-15 VITALS — BP 134/70 | HR 67 | Temp 98.6°F | Resp 16 | Ht 71.0 in | Wt 210.0 lb

## 2016-09-15 DIAGNOSIS — R059 Cough, unspecified: Secondary | ICD-10-CM

## 2016-09-15 DIAGNOSIS — R05 Cough: Secondary | ICD-10-CM | POA: Diagnosis not present

## 2016-09-15 DIAGNOSIS — R058 Other specified cough: Secondary | ICD-10-CM

## 2016-09-15 DIAGNOSIS — Z800 Family history of malignant neoplasm of digestive organs: Secondary | ICD-10-CM

## 2016-09-15 NOTE — Patient Instructions (Addendum)
Stop the over-the-counter cold/cough medicines. Restart Tessalon Perles 3 times per day as needed, Zyrtec 10 mg each day, Zantac 150mg  twice per day to treat for possible upper airway cough syndrome.   I will refer you to a pulmonologist to discuss this cough further, but if any worsening symptoms including hoarseness, trouble swallowing, or otherwise worsening, would consider other imaging or evaluation with ear nose and throat as we discussed.    Cough, Adult Coughing is a reflex that clears your throat and your airways. Coughing helps to heal and protect your lungs. It is normal to cough occasionally, but a cough that happens with other symptoms or lasts a long time may be a sign of a condition that needs treatment. A cough may last only 2-3 weeks (acute), or it may last longer than 8 weeks (chronic). What are the causes? Coughing is commonly caused by:  Breathing in substances that irritate your lungs.  A viral or bacterial respiratory infection.  Allergies.  Asthma.  Postnasal drip.  Smoking.  Acid backing up from the stomach into the esophagus (gastroesophageal reflux).  Certain medicines.  Chronic lung problems, including COPD (or rarely, lung cancer).  Other medical conditions such as heart failure. Follow these instructions at home: Pay attention to any changes in your symptoms. Take these actions to help with your discomfort:  Take medicines only as told by your health care provider.  If you were prescribed an antibiotic medicine, take it as told by your health care provider. Do not stop taking the antibiotic even if you start to feel better.  Talk with your health care provider before you take a cough suppressant medicine.  Drink enough fluid to keep your urine clear or pale yellow.  If the air is dry, use a cold steam vaporizer or humidifier in your bedroom or your home to help loosen secretions.  Avoid anything that causes you to cough at work or at home.  If  your cough is worse at night, try sleeping in a semi-upright position.  Avoid cigarette smoke. If you smoke, quit smoking. If you need help quitting, ask your health care provider.  Avoid caffeine.  Avoid alcohol.  Rest as needed. Contact a health care provider if:  You have new symptoms.  You cough up pus.  Your cough does not get better after 2-3 weeks, or your cough gets worse.  You cannot control your cough with suppressant medicines and you are losing sleep.  You develop pain that is getting worse or pain that is not controlled with pain medicines.  You have a fever.  You have unexplained weight loss.  You have night sweats. Get help right away if:  You cough up blood.  You have difficulty breathing.  Your heartbeat is very fast. This information is not intended to replace advice given to you by your health care provider. Make sure you discuss any questions you have with your health care provider. Document Released: 03/12/2011 Document Revised: 02/19/2016 Document Reviewed: 11/20/2014 Elsevier Interactive Patient Education  2017 [employer].   IF you received an x-ray today, you will receive an invoice from [facility]. Please contact [facility] at [phone] with questions or concerns regarding your invoice.   IF you received labwork today, you will receive an invoice from [place]. Please contact [facility] at [phone] with questions or concerns regarding your invoice.   Our billing staff will not be able to assist you with questions regarding bills from these companies.  You will be contacted  with the lab results as soon as they are available. The fastest way to get your results is to activate your My Chart account. Instructions are located on the last page of this paperwork. If you have not heard from [name] regarding the results in 2 weeks, please contact this office.

## 2016-09-15 NOTE — Progress Notes (Signed)
Subjective:  By signing my name below, I, Raven Small, attest that this documentation has been prepared under the direction and in the presence of [name], MD.  Electronically Signed: Thea Alken, ED Scribe. 09/15/2016. 11:42 AM.   Patient ID: Joseph Maldonado, male    DOB: 1946/05/22, 71 y.o.   MRN: [id number]  HPI   Chief Complaint  Patient presents with  . Cough    x 2-3 wks    HPI Comments: Joseph Maldonado is a 70 y.o. male who presents to the [facility] complaining of a cough. I last saw pt 11/20, at that time had dry cough for 6 weeks, thought to be due to dust exposure to renovation at home. minimal improvement with prednisone, tessalon and zytrec. Negative CXR 11/4 treated with amoxicillin early in the course. See details last visit. Rare heart burn, thought to upper airway cough syndrome last visit vs allergic component or reflux. Continue tessalon, restarted zyrtec and recommended zantac.   Since last visit, pt continues to have dry cough, more prominent during the day. Pt stopped zyrtec, did not try zantac and had decided instead to take dayquil, nyquil and tessalon. This regimen caused nausea, so went back to taking robitussin.  He's tried tums 3 times, but has continued to have cough. He denies fever, sore throat, trouble swallowing, weight loss, hoarseness wheezing SOB, CP. Pt reports similar symptoms a couple years ago, at that time had normal LFT.   Pt reports family hx of throat cancer in his mother but had also been a smoker for several years. Pt reports hx of small calcification in lung that they wanted to recheck on CT scan in a year.    There are no active problems to display for this patient.  Past Medical History:  Diagnosis Date  . Arthritis   . Chronic kidney disease   . Depression   . Hypertension    Past Surgical History:  Procedure Laterality Date  . JOINT REPLACEMENT     Allergies  Allergen Reactions  . Dilaudid [Hydromorphone  Hcl]    Prior to Admission medications   Medication Sig Start Date End Date Taking? Authorizing Provider  acetaminophen (TYLENOL) 500 MG tablet Take 500 mg by mouth every 6 (six) hours as needed.   Yes Historical Provider, MD  buPROPion (WELLBUTRIN SR) 200 MG 12 hr tablet TAKE 1 TABLET TWICE A DAY 07/29/16  Yes Wendie Agreste, MD  meclizine (ANTIVERT) 25 MG tablet Take 25 mg by mouth 3 (three) times daily as needed for dizziness.   Yes Historical Provider, MD  valsartan (DIOVAN) 160 MG tablet TAKE 1 TABLET DAILY 07/29/16  Yes Wendie Agreste, MD  zolpidem (AMBIEN) 5 MG tablet Take 0.5-1 tablets (2.5-5 mg total) by mouth at bedtime as needed for sleep. 07/29/16  Yes Wendie Agreste, MD  azelastine (ASTELIN) 0.1 % nasal spray Place 2 sprays into both nostrils 2 (two) times daily. Use in each nostril as directed Patient not taking: Reported on 09/15/2016 07/07/16   Alveda Reasons, MD  benzonatate (TESSALON) 100 MG capsule Take 1 capsule (100 mg total) by mouth 2 (two) times daily as needed for cough. Patient not taking: Reported on 09/15/2016 08/24/16   Wendie Agreste, MD   Social History   Social History  . Marital status: Married    Spouse name: N/A  . Number of children: N/A  . Years of education: N/A   Occupational History  . Retired  Social History Main Topics  . Smoking status: Former Research scientist (life sciences)  . Smokeless tobacco: Never Used  . Alcohol use 2.4 oz/week    4 Standard drinks or equivalent per week     Comment: rarely  . Drug use: No  . Sexual activity: No   Other Topics Concern  . Not on file   Social History Narrative   Married   Education: College   Exercise: No   Review of Systems  Constitutional: Negative for chills, fever and unexpected weight change.  HENT: Negative for sore throat, trouble swallowing and voice change.   Respiratory: Positive for cough. Negative for chest tightness, shortness of breath and wheezing.   Cardiovascular: Negative for chest pain.         Objective:   Physical Exam  Constitutional: He is oriented to person, place, and time. He appears well-developed and well-nourished.  HENT:  Head: Normocephalic and atraumatic.  Right Ear: Tympanic membrane and ear canal normal.  Left Ear: Tympanic membrane and ear canal normal.  Nose: No rhinorrhea.  Mouth/Throat: Mucous membranes are normal. No posterior oropharyngeal erythema.  Eyes: Conjunctivae are normal. Pupils are equal, round, and reactive to light.  Neck: Neck supple.  Cardiovascular: Normal rate, regular rhythm, normal heart sounds and intact distal pulses.   No murmur heard. Pulmonary/Chest: Effort normal and breath sounds normal. He has no wheezes. He has no rhonchi. He has no rales.  Abdominal: Soft. There is no tenderness.  Musculoskeletal: He exhibits no edema.  Lymphadenopathy:    He has no cervical adenopathy.  Neurological: He is alert and oriented to person, place, and time.  Skin: Skin is warm and dry. No rash noted.  Psychiatric: He has a normal mood and affect. His behavior is normal.  Vitals reviewed.         Assessment & Plan:   Joseph Maldonado is a 70 y.o. male Cough - Plan: Ambulatory referral to Pulmonology  Upper airway cough syndrome - Plan: Ambulatory referral to Pulmonology  Family history of throat cancer - Plan: Ambulatory referral to Pulmonology   -Stop over-the-counter cold meds, restart Tessalon Perles, Zyrtec, Zantac for common causes of upper airway cough syndrome  -Due to persistent cough, refer to pulmonology. However with family history of throat malignancy, advised if any hoarseness, dysphagia, consider ENT evaluation  No orders of the defined types were placed in this encounter.  Patient Instructions   Stop the over-the-counter cold/cough medicines. Restart Tessalon Perles 3 times per day as needed, Zyrtec 10 mg each day, Zantac 150mg  twice per day to treat for possible upper airway cough syndrome.   I will refer you to  a pulmonologist to discuss this cough further, but if any worsening symptoms including hoarseness, trouble swallowing, or otherwise worsening, would consider other imaging or evaluation with ear nose and throat as we discussed.    Cough, Adult Coughing is a reflex that clears your throat and your airways. Coughing helps to heal and protect your lungs. It is normal to cough occasionally, but a cough that happens with other symptoms or lasts a long time may be a sign of a condition that needs treatment. A cough may last only 2-3 weeks (acute), or it may last longer than 8 weeks (chronic). What are the causes? Coughing is commonly caused by:  Breathing in substances that irritate your lungs.  A viral or bacterial respiratory infection.  Allergies.  Asthma.  Postnasal drip.  Smoking.  Acid backing up from the stomach into the esophagus (gastroesophageal  reflux).  Certain medicines.  Chronic lung problems, including COPD (or rarely, lung cancer).  Other medical conditions such as heart failure. Follow these instructions at home: Pay attention to any changes in your symptoms. Take these actions to help with your discomfort:  Take medicines only as told by your health care provider.  If you were prescribed an antibiotic medicine, take it as told by your health care provider. Do not stop taking the antibiotic even if you start to feel better.  Talk with your health care provider before you take a cough suppressant medicine.  Drink enough fluid to keep your urine clear or pale yellow.  If the air is dry, use a cold steam vaporizer or humidifier in your bedroom or your home to help loosen secretions.  Avoid anything that causes you to cough at work or at home.  If your cough is worse at night, try sleeping in a semi-upright position.  Avoid cigarette smoke. If you smoke, quit smoking. If you need help quitting, ask your health care provider.  Avoid caffeine.  Avoid  alcohol.  Rest as needed. Contact a health care provider if:  You have new symptoms.  You cough up pus.  Your cough does not get better after 2-3 weeks, or your cough gets worse.  You cannot control your cough with suppressant medicines and you are losing sleep.  You develop pain that is getting worse or pain that is not controlled with pain medicines.  You have a fever.  You have unexplained weight loss.  You have night sweats. Get help right away if:  You cough up blood.  You have difficulty breathing.  Your heartbeat is very fast. This information is not intended to replace advice given to you by your health care provider. Make sure you discuss any questions you have with your health care provider. Document Released: 03/12/2011 Document Revised: 02/19/2016 Document Reviewed: 11/20/2014 Elsevier Interactive Patient Education  2017 [employer].   IF you received an x-ray today, you will receive an invoice from [facility]. Please contact [facility] Radiology at [phone] with questions or concerns regarding your invoice.   IF you received labwork today, you will receive an invoice from [facility]. Please contact [facility] at [phone] with questions or concerns regarding your invoice.   Our billing staff will not be able to assist you with questions regarding bills from these companies.  You will be contacted with the lab results as soon as they are available. The fastest way to get your results is to activate your My Chart account. Instructions are located on the last page of this paperwork. If you have not heard from [name] regarding the results in 2 weeks, please contact this office.        I personally performed the services described in this documentation, which was scribed in my presence. The recorded information has been reviewed and considered, and addended by me as needed.   Signed,   [name], MD [facility].  09/18/16 11:39 PM

## 2016-09-29 ENCOUNTER — Encounter: Payer: Self-pay | Admitting: Internal Medicine

## 2016-09-29 ENCOUNTER — Ambulatory Visit (INDEPENDENT_AMBULATORY_CARE_PROVIDER_SITE_OTHER): Payer: Medicare Other | Admitting: Internal Medicine

## 2016-09-29 VITALS — BP 116/68 | HR 82 | Ht 71.0 in | Wt 210.0 lb

## 2016-09-29 DIAGNOSIS — R911 Solitary pulmonary nodule: Secondary | ICD-10-CM

## 2016-09-29 DIAGNOSIS — R05 Cough: Secondary | ICD-10-CM

## 2016-09-29 DIAGNOSIS — R059 Cough, unspecified: Secondary | ICD-10-CM | POA: Insufficient documentation

## 2016-09-29 MED ORDER — PANTOPRAZOLE SODIUM 40 MG PO TBEC: 40.0000 mg | DELAYED_RELEASE_TABLET | Freq: Every day | ORAL | 2 refills | Status: DC

## 2016-09-29 NOTE — Progress Notes (Signed)
Subjective:     Patient ID: Joseph Maldonado, male   DOB: [birth year],    MRN: [id number]  HPI  6 yowm minimal smoking hx quit age 71 with recurrent pattern of cough going back to around 2007/8 while living [place] with neg w/u for asthma but persistent pattern since then about  q 2 year severe cough so referred to pulmonary clinic 09/29/2016 by Dr   [name]   09/29/2016 1st [facility] office visit/ Joseph Maldonado   Chief Complaint  Patient presents with  . Pulm Consult    Unproductive cough since Nov. 2017. Usually gets this every year but this round isn't responding to medications. No SOB.   onset was acute p dust exposure in mid Nov 2017 day >> noct dry > wet and last exp to dry wall dust mid December  [facility] did CT of bladder and kidneys and found calcium  deposit > rec CT fall 2018  Started omeprazole 20 mg daily /zyretc/zantac/ tessalon just ran out  One day prior to OV  And cough some better  Not limited by breathing from desired activities    No obvious day to day or daytime variability or assoc excess/ purulent sputum or mucus plugs or hemoptysis or cp or chest tightness, subjective wheeze or overt sinus or hb symptoms. No unusual exp hx or h/o childhood pna/ asthma or knowledge of premature birth.  Sleeping ok without nocturnal  or early am exacerbation  of respiratory  c/o's or need for noct saba. Also denies any obvious fluctuation of symptoms with weather or environmental changes or other aggravating or alleviating factors except as outlined above   Current Medications, Allergies, Complete Past Medical History, Past Surgical History, Family History, and Social History were reviewed in [employer] record.  ROS  The following are not active complaints unless bolded sore throat, dysphagia, dental problems, itching, sneezing,  nasal congestion or excess/ purulent secretions, ear ache,   fever, chills, sweats, unintended wt loss, classically pleuritic or  exertional cp,  orthopnea pnd or leg swelling, presyncope, palpitations, abdominal pain, anorexia, nausea, vomiting, diarrhea  or change in bowel or bladder habits, change in stools or urine, dysuria,hematuria,  rash, arthralgias, visual complaints, headache, numbness, weakness or ataxia or problems with walking or coordination,  change in mood/affect or memory.          Review of Systems     Objective:   Physical Exam    amb wm nad mild voice fatigue /  freq throat clearing    Wt Readings from Last 3 Encounters:  09/29/16 210 lb (95.3 kg)  09/15/16 210 lb (95.3 kg)  09/05/16 210 lb (95.3 kg)    Vital signs reviewed - Note on arrival 02 sats  99% on RA      HEENT: nl dentition, turbinates, and oropharynx. Nl external ear canals without cough reflex   NECK :  without JVD/Nodes/TM/ nl carotid upstrokes bilaterally   LUNGS: no acc muscle use,  Nl contour chest which is clear to A and P bilaterally without cough on insp or exp maneuvers   CV:  RRR  no s3 or murmur or increase in P2, nad no edema   ABD:  soft and nontender with nl inspiratory excursion in the supine position. No bruits or organomegaly appreciated, bowel sounds nl  MS:  Nl gait/ ext warm without deformities, calf tenderness, cyanosis or clubbing No obvious joint restrictions   SKIN: warm and dry without lesions    NEURO:  alert, approp, nl sensorium with  no motor or cerebellar deficits apparent.       I personally reviewed images and agree with radiology impression as follows:  CXR:   07/31/16  Hyperinflation, without acute disease. Calcium anteriorly on lateral  Assessment:

## 2016-09-29 NOTE — Patient Instructions (Addendum)
Pantoprazole (protonix) 40 mg (double the prilosec is the same)   Take  30-60 min before first meal of the day and  Zantac 150 mg at bedtime until no cough for a week and if not better after 2 weeks then need to see [name]  GERD (REFLUX)  is an extremely common cause of respiratory symptoms just like yours , many times with no obvious heartburn at all.    It can be treated with medication, but also with lifestyle changes including elevation of the head of your bed (ideally with 6 inch  bed blocks),  Smoking cessation, avoidance of late meals, excessive alcohol, and avoid fatty foods, chocolate, peppermint, colas, red wine, and acidic juices such as orange juice.  NO MINT OR MENTHOL PRODUCTS SO NO COUGH DROPS  USE SUGARLESS CANDY INSTEAD (Jolley ranchers or Stover's or Life Savers) or even ice chips will also do - the key is to swallow to prevent all throat clearing. NO OIL BASED VITAMINS - use powdered substitutes.  I will call after I review your Ct from urology

## 2016-09-30 DIAGNOSIS — H52223 Regular astigmatism, bilateral: Secondary | ICD-10-CM | POA: Diagnosis not present

## 2016-09-30 DIAGNOSIS — H5203 Hypermetropia, bilateral: Secondary | ICD-10-CM | POA: Diagnosis not present

## 2016-09-30 DIAGNOSIS — H2513 Age-related nuclear cataract, bilateral: Secondary | ICD-10-CM | POA: Diagnosis not present

## 2016-09-30 DIAGNOSIS — H1850 Unspecified hereditary corneal dystrophies: Secondary | ICD-10-CM | POA: Diagnosis not present

## 2016-09-30 DIAGNOSIS — H524 Presbyopia: Secondary | ICD-10-CM | POA: Diagnosis not present

## 2016-09-30 DIAGNOSIS — H2512 Age-related nuclear cataract, left eye: Secondary | ICD-10-CM | POA: Diagnosis not present

## 2016-09-30 DIAGNOSIS — H35033 Hypertensive retinopathy, bilateral: Secondary | ICD-10-CM | POA: Diagnosis not present

## 2016-10-03 DIAGNOSIS — R911 Solitary pulmonary nodule: Secondary | ICD-10-CM | POA: Insufficient documentation

## 2016-10-03 NOTE — Assessment & Plan Note (Signed)
See cxr 07/31/16 c/w calcified granuloma  I suspect this is what was seen on urology scan/ records requested    results reviewed with pt >>> Too small for PET or bx, not suspicious enough for excisional bx > really only option for now is follow the Fleischner society guidelines as rec by radiology.  If it it is a typical granuloma then nothing needed, if not return in one year  Discussed in detail all the  indications, usual  risks and alternatives  relative to the benefits with patient who agrees to proceed with conservative f/u as outlined

## 2016-10-03 NOTE — Assessment & Plan Note (Signed)
The most common causes of chronic cough in immunocompetent adults include the following: upper airway cough syndrome (UACS), previously referred to as postnasal drip syndrome (PNDS), which is caused by variety of rhinosinus conditions; (2) asthma; (3) GERD; (4) chronic bronchitis from cigarette smoking or other inhaled environmental irritants; (5) nonasthmatic eosinophilic bronchitis; and (6) bronchiectasis.   These conditions, singly or in combination, have accounted for up to 94% of the causes of chronic cough in prospective studies.   Other conditions have constituted no >6% of the causes in prospective studies These have included bronchogenic carcinoma, chronic interstitial pneumonia, sarcoidosis, left ventricular failure, ACEI-induced cough, and aspiration from a condition associated with pharyngeal dysfunction.    Chronic cough is often simultaneously caused by more than one condition. A single cause has been found from 38 to 82% of the time, multiple causes from 18 to 62%. Multiply caused cough has been the result of three diseases up to 42% of the time.       Of the three most common causes of chronic cough, only one (GERD)  can actually cause the other two (asthma and post nasal drip syndrome)  and perpetuate the cylce of cough inducing airway trauma, inflammation, heightened sensitivity to reflux which is prompted by the cough itself via a cyclical mechanism.    This may partially respond to steroids and look like asthma and post nasal drainage but never erradicated completely unless the cough and the secondary reflux are eliminated, preferably both at the same time.  While not intuitively obvious, many patients with chronic low grade reflux do not cough until there is a secondary insult that disturbs the protective epithelial barrier and exposes sensitive nerve endings.  This can be viral or direct physical injury such as with an endotracheal tube.   The point is that once this occurs, it is  difficult to eliminate using anything but a maximally effective acid suppression regimen at least in the short run, accompanied by an appropriate diet to address non acid GERD.   For now rec max rx for gerd and f/u in 2-4 weeks if not improving   Total time devoted to counseling  > 50 % of 60 min new pt office visit:  review case with pt/ discussion of options/alternatives/ personally creating written customized instructions  in presence of pt  then going over those specific  Instructions directly with the pt including how to use all of the meds but in particular covering each new medication in detail and the difference between the maintenance/automatic meds and the prns using an action plan format for the latter.  Please see AVS from this visit for a full list of these instructions which I personally wrote for this pt and  are unique to this visit.

## 2016-10-10 DIAGNOSIS — H2511 Age-related nuclear cataract, right eye: Secondary | ICD-10-CM | POA: Diagnosis not present

## 2016-11-15 DIAGNOSIS — H25812 Combined forms of age-related cataract, left eye: Secondary | ICD-10-CM | POA: Diagnosis not present

## 2016-11-15 DIAGNOSIS — H2512 Age-related nuclear cataract, left eye: Secondary | ICD-10-CM | POA: Diagnosis not present

## 2016-12-06 DIAGNOSIS — H25811 Combined forms of age-related cataract, right eye: Secondary | ICD-10-CM | POA: Diagnosis not present

## 2016-12-06 DIAGNOSIS — H2511 Age-related nuclear cataract, right eye: Secondary | ICD-10-CM | POA: Diagnosis not present

## 2017-01-27 ENCOUNTER — Encounter: Payer: Self-pay | Admitting: Family Medicine

## 2017-01-27 ENCOUNTER — Ambulatory Visit (INDEPENDENT_AMBULATORY_CARE_PROVIDER_SITE_OTHER): Payer: Medicare Other

## 2017-01-27 ENCOUNTER — Ambulatory Visit (INDEPENDENT_AMBULATORY_CARE_PROVIDER_SITE_OTHER): Payer: Medicare Other | Admitting: Family Medicine

## 2017-01-27 VITALS — BP 135/74 | HR 71 | Temp 97.8°F | Resp 18 | Ht 69.29 in | Wt 216.4 lb

## 2017-01-27 DIAGNOSIS — M25551 Pain in right hip: Secondary | ICD-10-CM

## 2017-01-27 DIAGNOSIS — M25511 Pain in right shoulder: Secondary | ICD-10-CM

## 2017-01-27 DIAGNOSIS — M67911 Unspecified disorder of synovium and tendon, right shoulder: Secondary | ICD-10-CM | POA: Diagnosis not present

## 2017-01-27 DIAGNOSIS — S79911A Unspecified injury of right hip, initial encounter: Secondary | ICD-10-CM | POA: Diagnosis not present

## 2017-01-27 NOTE — Patient Instructions (Addendum)
Tylenol is okay for pain for now. I suspect you have some rotator cuff tendinopathy and possible component of arthritis causing your right shoulder pain. You may also have some arthritis of the right hip with a flare after the fall, but I will check an x-ray to see if there are any signs of fracture. I did refer you back to [facility] for evaluation of both areas.    IF you received an x-ray today, you will receive an invoice from [facility]. Please contact [facility] at [phone] with questions or concerns regarding your invoice.   IF you received labwork today, you will receive an invoice from [place]. Please contact [facility] at [phone] with questions or concerns regarding your invoice.   Our billing staff will not be able to assist you with questions regarding bills from these companies.  You will be contacted with the lab results as soon as they are available. The fastest way to get your results is to activate your My Chart account. Instructions are located on the last page of this paperwork. If you have not heard from [name] regarding the results in 2 weeks, please contact this office.

## 2017-01-27 NOTE — Progress Notes (Signed)
By signing my name below, I, Joseph Maldonado, attest that this documentation has been prepared under the direction and in the presence of Joseph Ray, MD.  Electronically Signed: Verlee Maldonado, Medical Scribe. 01/27/17. 9:44 AM.  Subjective:    Patient ID: Joseph Maldonado, male    DOB: 30-Mar-1946, 71 y.o.   MRN: [id number]  HPI Chief Complaint  Patient presents with  . Follow-up    R shoulder and hip    HPI Comments: Joseph Maldonado is a 71 y.o. male who presents to Primary Care at [facility] for right shoulder and hip pain worsening the last 6 months.  Shoulder: Pt had x-Maldonado of his shoulder in 01/10/14. Reports associated sxs of limited ROM, loss of sleep from pain, and bilateral shoulder pain R>L. Pt has been taking tylenol for his sxs; 2 in the morning and 2 at night. Pt brought in results from right shoulder x-Maldonado 12/2013: he had some subluxation of humeral head possible chronic rotator cuff injury, tendinopathy. Also noted OA of AC and GA joints. Pt carried a heavy bag long distant and afterwards he couldn't reach or get anything. Pt was recommended cortisone shot or surgery, but he never recieved treatment for his sxs.  Right hip Pain: Onset months and worse after pt tripped in his backyard 10 days ago without initial pain, but felt pain the next morning. Pt mentions his pain is improving, and he can bear weight on it without difficulty. Pain exacerbates when he sits in a chair when his feet planted on the floor and he twist his. Left hip replacement in 2011, and bilateral knee replacement.  Patient Active Problem List   Diagnosis Date Noted  . Solitary pulmonary nodule 10/03/2016  . Cough 09/29/2016   Past Medical History:  Diagnosis Date  . Arthritis   . Chronic kidney disease   . Depression   . Hypertension    Past Surgical History:  Procedure Laterality Date  . CATARACT EXTRACTION, BILATERAL    . JOINT REPLACEMENT     Allergies  Allergen Reactions  . Dilaudid  [Hydromorphone Hcl]    Prior to Admission medications   Medication Sig Start Date End Date Taking? Authorizing Provider  acetaminophen (TYLENOL) 500 MG tablet Take 500 mg by mouth every 6 (six) hours as needed.   Yes Historical Provider, MD  benzonatate (TESSALON) 100 MG capsule Take 1 capsule (100 mg total) by mouth 2 (two) times daily as needed for cough. 08/24/16  Yes Wendie Agreste, MD  buPROPion (WELLBUTRIN SR) 200 MG 12 hr tablet TAKE 1 TABLET TWICE A DAY Patient taking differently: daily. TAKE 1 TABLET TWICE A DAY 07/29/16  Yes Wendie Agreste, MD  meclizine (ANTIVERT) 25 MG tablet Take 25 mg by mouth 3 (three) times daily as needed for dizziness.   Yes Historical Provider, MD  pantoprazole (PROTONIX) 40 MG tablet Take 1 tablet (40 mg total) by mouth daily. Take 30-60 min before first meal of the day 09/29/16  Yes Tanda Rockers, MD  valsartan (DIOVAN) 160 MG tablet TAKE 1 TABLET DAILY 07/29/16  Yes Wendie Agreste, MD  zolpidem (AMBIEN) 5 MG tablet Take 0.5-1 tablets (2.5-5 mg total) by mouth at bedtime as needed for sleep. 07/29/16  Yes Wendie Agreste, MD   Social History   Social History  . Marital status: Married    Spouse name: N/A  . Number of children: N/A  . Years of education: N/A   Occupational History  . Retired  Social History Main Topics  . Smoking status: Former Research scientist (life sciences)  . Smokeless tobacco: Never Used  . Alcohol use 2.4 oz/week    4 Standard drinks or equivalent per week     Comment: rarely  . Drug use: No  . Sexual activity: No   Other Topics Concern  . Not on file   Social History Narrative   Married   Education: College   Exercise: No   Review of Systems  Musculoskeletal: Positive for arthralgias. Negative for gait problem.  Psychiatric/Behavioral: Positive for sleep disturbance.    Objective:  Physical Exam  Constitutional: He appears well-developed and well-nourished. No distress.  HENT:  Head: Normocephalic and atraumatic.  Eyes:  Conjunctivae are normal.  Neck: Neck supple.  Cardiovascular: Normal rate.   Pulmonary/Chest: Effort normal.  Musculoskeletal:  Slight decreased rotation at the c-spin and lateral rotation but pain free ROM AC/[place] clavicle non tender Anterior shoulder non tender including biceps groove Shoulder: Equal ROM Slight discomfort with empty can ut full strength Pain with external rotation Negative lift off Negative Neer Negative Hawkins Negative crossover Negative O'[place] Healing ecchymosis on his right knee but pain free ROM Right hip: some guarding with ROM Pain with flexion, and internal rotation Pain free from external rotation  Neurological: He is alert.  Skin: Skin is warm and dry.  Psychiatric: He has a normal mood and affect. His behavior is normal.  Nursing note and vitals reviewed.   Vitals:   01/27/17 0933  BP: 135/74  Pulse: 71  Resp: 18  Temp: 97.8 F (36.6 C)  TempSrc: Oral  SpO2: 97%  Weight: 216 lb 6.4 oz (98.2 kg)  Height: 5' 9.29" (1.76 m)  Body mass index is 31.69 kg/m.   Dg Hip Unilat W Or W/o Pelvis 2-3 Views Right  Result Date: 01/27/2017 CLINICAL DATA:  Right hip pain for several months which worsened after fall 1 week ago. Initial encounter. EXAM: DG HIP (WITH OR WITHOUT PELVIS) 2-3V RIGHT COMPARISON:  CT abdomen and pelvis 06/18/2016. FINDINGS: No acute bony or joint abnormality is identified. The patient has advanced right hip osteoarthritis. Left hip arthroplasty is partially visualized. Lower lumbar spondylosis is also seen. IMPRESSION: No acute abnormality. Advanced right hip osteoarthritis. Lower lumbar spondylosis. Electronically Signed   By: Inge Rise M.D.   On: 01/27/2017 11:07   Assessment & Plan:  Joseph Maldonado is a 71 y.o. male Pain in joint of right shoulder - Plan: Ambulatory referral to Orthopedic Surgery Tendinopathy of right rotator cuff - Plan: Ambulatory referral to Orthopedic Surgery  - Suspected rotator cuff tendinopathy with  osteoarthritis. Worsening symptoms recently. May benefit from intra-articular corticosteroid injection versus physical therapy versus both. Will refer to orthopedics to determine next step in treatment.  Right hip pain - Plan: DG HIP UNILAT W OR W/O PELVIS 2-3 VIEWS RIGHT, Ambulatory referral to Orthopedic Surgery  -X-Maldonado without signs of fracture, no pain with weightbearing. Suspected primary osteoarthritis, advanced on x-Maldonado. Evaluate with orthopedics as well to determine if injection versus surgical approach needed. Tolerating Tylenol over-the-counter. Advised let me know if stronger medication needed in the interim.  No orders of the defined types were placed in this encounter.  Patient Instructions   Tylenol is okay for pain for now. I suspect you have some rotator cuff tendinopathy and possible component of arthritis causing your right shoulder pain. You may also have some arthritis of the right hip with a flare after the fall, but I will check an x-Maldonado to see  if there are any signs of fracture. I did refer you back to [facility] for evaluation of both areas.    IF you received an x-Maldonado today, you will receive an invoice from [facility]. Please contact [facility] at [phone] with questions or concerns regarding your invoice.   IF you received labwork today, you will receive an invoice from [place]. Please contact [facility] at [phone] with questions or concerns regarding your invoice.   Our billing staff will not be able to assist you with questions regarding bills from these companies.  You will be contacted with the lab results as soon as they are available. The fastest way to get your results is to activate your My Chart account. Instructions are located on the last page of this paperwork. If you have not heard from [name] regarding the results in 2 weeks, please contact this office.       I personally performed the services described in this  documentation, which was scribed in my presence. The recorded information has been reviewed and considered for accuracy and completeness, addended by me as needed, and agree with information above.  Signed,   Joseph Ray, MD [facility] at [place].  01/29/17 1:07 PM

## 2017-02-02 ENCOUNTER — Ambulatory Visit (INDEPENDENT_AMBULATORY_CARE_PROVIDER_SITE_OTHER): Payer: Medicare Other | Admitting: Physician Assistant

## 2017-02-02 ENCOUNTER — Telehealth: Payer: Self-pay | Admitting: Family Medicine

## 2017-02-02 ENCOUNTER — Encounter: Payer: Self-pay | Admitting: Physician Assistant

## 2017-02-02 VITALS — BP 126/73 | HR 59 | Temp 98.8°F | Resp 18 | Ht 69.0 in | Wt 214.0 lb

## 2017-02-02 DIAGNOSIS — S80811A Abrasion, right lower leg, initial encounter: Secondary | ICD-10-CM

## 2017-02-02 DIAGNOSIS — W57XXXA Bitten or stung by nonvenomous insect and other nonvenomous arthropods, initial encounter: Secondary | ICD-10-CM

## 2017-02-02 NOTE — Telephone Encounter (Signed)
Pt advised to return to clinic for office visit. Transferred to schedule line

## 2017-02-02 NOTE — Progress Notes (Signed)
Joseph Maldonado  MRN: [id number] DOB: 09-Jul-1946  PCP: Wendie Agreste, MD  Subjective:  Pt is a 71 year old male who presents to clinic for tick bite. He got a tick on his upper inner right thigh four days ago and pulled it off with tweezers that day. He had a difficult time pulling it out and "had to struggle a little bit". He endorses some skin changes surrounding the area.  Denies rash, joint pain, muscle aches, nausea, vomiting, chills, fever.   Review of Systems  Constitutional: Negative for chills, diaphoresis, fatigue and fever.  Eyes: Negative for photophobia and visual disturbance.  Cardiovascular: Negative for chest pain and palpitations.  Gastrointestinal: Negative for abdominal pain, diarrhea, nausea and vomiting.  Musculoskeletal: Negative for arthralgias, gait problem, joint swelling, myalgias and neck pain.  Skin: Positive for wound. Negative for rash.  Neurological: Negative for dizziness, weakness, light-headedness and headaches.  Psychiatric/Behavioral: Negative for confusion. The patient is not nervous/anxious.     Patient Active Problem List   Diagnosis Date Noted  . Solitary pulmonary nodule 10/03/2016  . Cough 09/29/2016    Current Outpatient Prescriptions on File Prior to Visit  Medication Sig Dispense Refill  . acetaminophen (TYLENOL) 500 MG tablet Take 500 mg by mouth every 6 (six) hours as needed.    Marland Kitchen buPROPion (WELLBUTRIN SR) 200 MG 12 hr tablet TAKE 1 TABLET TWICE A DAY 180 tablet 2  . valsartan (DIOVAN) 160 MG tablet TAKE 1 TABLET DAILY 90 tablet 2  . benzonatate (TESSALON) 100 MG capsule Take 1 capsule (100 mg total) by mouth 2 (two) times daily as needed for cough. (Patient not taking: Reported on 02/02/2017) 30 capsule 1  . meclizine (ANTIVERT) 25 MG tablet Take 25 mg by mouth 3 (three) times daily as needed for dizziness.    . pantoprazole (PROTONIX) 40 MG tablet Take 1 tablet (40 mg total) by mouth daily. Take 30-60 min before first meal of the  day (Patient not taking: Reported on 02/02/2017) 30 tablet 2  . zolpidem (AMBIEN) 5 MG tablet Take 0.5-1 tablets (2.5-5 mg total) by mouth at bedtime as needed for sleep. (Patient not taking: Reported on 02/02/2017) 30 tablet 0   No current facility-administered medications on file prior to visit.     Allergies  Allergen Reactions  . Dilaudid [Hydromorphone Hcl]      Objective:  BP 126/73 (BP Location: Right Arm, Patient Position: Sitting, Cuff Size: Large)   Pulse (!) 59   Temp 98.8 F (37.1 C) (Oral)   Resp 18   Ht 5\' 9"  (1.753 m)   Wt 214 lb (97.1 kg)   SpO2 97%   BMI 31.60 kg/m   Physical Exam  Constitutional: He is oriented to person, place, and time and well-developed, well-nourished, and in no distress. No distress.  Cardiovascular: Normal rate, regular rhythm and normal heart sounds.   Neurological: He is alert and oriented to person, place, and time. GCS score is 15.  Skin: Skin is warm and dry.  Psychiatric: Mood, memory, affect and judgment normal.  Vitals reviewed.   Assessment and Plan :  1. Tick bite, initial encounter 2. Abrasion of right lower extremity, initial encounter - Abrasion of thigh appears to be from trauma from tick removal.  No concern for cellulitis, or tick-associated rash. Discussed with pt warning signs/symptoms. RTC if symptoms worsen or fail to improve.    Mercer Pod, PA-C  Primary [facility] at [place] 02/02/2017 12:02 PM

## 2017-02-02 NOTE — Telephone Encounter (Signed)
PATIENT WOULD LIKE THIS MESSAGE TO GO TO DR. Carlota Raspberry: HE WANTS DR. GREEN TO KNOW THAT Sunday HE FOUND A TICK ON HIS (R) LEG. HE TRIED TO PULL IT OUT WITH SOME SPECIALIZED TWEEZERS AND WHILE DOING THAT HE PINCHED HIS SKIN BADLY. NOW HE HAS A RED MARK THERE. HE WANTS TO KNOW IF DR. GREENE WANTS HIM TO START ON A ANTIBIOTIC? (I OFFERED FOR HIM TO MAKE AN APPOINTMENT TODAY BUT HE JUST WANTS TO GET A CALL BACK). BEST PHONE [phone] (CELL) PHARMACY CHOICE IS [facility] [address]. [place]

## 2017-02-02 NOTE — Patient Instructions (Signed)
     IF you received an x-ray today, you will receive an invoice from [facility]. Please contact [facility] at [phone] with questions or concerns regarding your invoice.   IF you received labwork today, you will receive an invoice from [facility]. Please contact [facility] at [phone] with questions or concerns regarding your invoice.   Our billing staff will not be able to assist you with questions regarding bills from these companies.  You will be contacted with the lab results as soon as they are available. The fastest way to get your results is to activate your My Chart account. Instructions are located on the last page of this paperwork. If you have not heard from us regarding the results in 2 weeks, please contact this office.     

## 2017-02-08 DIAGNOSIS — M67911 Unspecified disorder of synovium and tendon, right shoulder: Secondary | ICD-10-CM | POA: Diagnosis not present

## 2017-02-08 DIAGNOSIS — M1612 Unilateral primary osteoarthritis, left hip: Secondary | ICD-10-CM | POA: Diagnosis not present

## 2017-02-08 DIAGNOSIS — M25551 Pain in right hip: Secondary | ICD-10-CM | POA: Diagnosis not present

## 2017-03-10 DIAGNOSIS — M75121 Complete rotator cuff tear or rupture of right shoulder, not specified as traumatic: Secondary | ICD-10-CM | POA: Diagnosis not present

## 2017-03-10 DIAGNOSIS — M19011 Primary osteoarthritis, right shoulder: Secondary | ICD-10-CM | POA: Diagnosis not present

## 2017-04-13 DIAGNOSIS — L814 Other melanin hyperpigmentation: Secondary | ICD-10-CM | POA: Diagnosis not present

## 2017-04-13 DIAGNOSIS — L821 Other seborrheic keratosis: Secondary | ICD-10-CM | POA: Diagnosis not present

## 2017-04-13 DIAGNOSIS — D485 Neoplasm of uncertain behavior of skin: Secondary | ICD-10-CM | POA: Diagnosis not present

## 2017-04-13 DIAGNOSIS — D2262 Melanocytic nevi of left upper limb, including shoulder: Secondary | ICD-10-CM | POA: Diagnosis not present

## 2017-04-13 DIAGNOSIS — D225 Melanocytic nevi of trunk: Secondary | ICD-10-CM | POA: Diagnosis not present

## 2017-04-13 DIAGNOSIS — D2261 Melanocytic nevi of right upper limb, including shoulder: Secondary | ICD-10-CM | POA: Diagnosis not present

## 2017-04-13 DIAGNOSIS — L570 Actinic keratosis: Secondary | ICD-10-CM | POA: Diagnosis not present

## 2017-04-13 DIAGNOSIS — L905 Scar conditions and fibrosis of skin: Secondary | ICD-10-CM | POA: Diagnosis not present

## 2017-04-18 ENCOUNTER — Other Ambulatory Visit: Payer: Self-pay | Admitting: Family Medicine

## 2017-04-18 DIAGNOSIS — I10 Essential (primary) hypertension: Secondary | ICD-10-CM

## 2017-04-22 ENCOUNTER — Telehealth: Payer: Self-pay | Admitting: Family Medicine

## 2017-04-22 ENCOUNTER — Other Ambulatory Visit: Payer: Self-pay | Admitting: Emergency Medicine

## 2017-04-22 DIAGNOSIS — I10 Essential (primary) hypertension: Secondary | ICD-10-CM

## 2017-04-22 MED ORDER — LOSARTAN POTASSIUM 50 MG PO TABS: 50.0000 mg | ORAL_TABLET | Freq: Every day | ORAL | 3 refills | Status: DC

## 2017-04-22 NOTE — Telephone Encounter (Signed)
Pt called stating that the medication Dr. Carlota Raspberry prescribed to him on 03/2617 valsartan (DIOVAN) 160 MG tablet [[id number]] is recalled and he needs another medication asap because he is going out of town for 2 weeks and needs it before he goes..  Please advise: [phone]

## 2017-04-22 NOTE — Telephone Encounter (Signed)
Pt returned the message. States he will start the new medication but if dizziness or increased headaches occur per side effects, he will stop. Patient is going to [place] for 2 weeks and will let us know if any problems.

## 2017-04-22 NOTE — Telephone Encounter (Signed)
Left a detailed message explaining new BP substitution medication to start New script given per Dr. Carlota Raspberry Losartan 50 mg tab daily.  Advised patient to return message to go over new medication side effects

## 2017-05-17 DIAGNOSIS — D485 Neoplasm of uncertain behavior of skin: Secondary | ICD-10-CM | POA: Diagnosis not present

## 2017-05-17 DIAGNOSIS — L821 Other seborrheic keratosis: Secondary | ICD-10-CM | POA: Diagnosis not present

## 2017-05-17 DIAGNOSIS — L988 Other specified disorders of the skin and subcutaneous tissue: Secondary | ICD-10-CM | POA: Diagnosis not present

## 2017-07-05 DIAGNOSIS — Z23 Encounter for immunization: Secondary | ICD-10-CM | POA: Diagnosis not present

## 2017-07-20 DIAGNOSIS — Z961 Presence of intraocular lens: Secondary | ICD-10-CM | POA: Diagnosis not present

## 2017-07-20 DIAGNOSIS — H35033 Hypertensive retinopathy, bilateral: Secondary | ICD-10-CM | POA: Diagnosis not present

## 2017-07-20 DIAGNOSIS — H5213 Myopia, bilateral: Secondary | ICD-10-CM | POA: Diagnosis not present

## 2017-07-20 DIAGNOSIS — H1850 Unspecified hereditary corneal dystrophies: Secondary | ICD-10-CM | POA: Diagnosis not present

## 2017-07-20 DIAGNOSIS — H524 Presbyopia: Secondary | ICD-10-CM | POA: Diagnosis not present

## 2017-07-20 DIAGNOSIS — H26493 Other secondary cataract, bilateral: Secondary | ICD-10-CM | POA: Diagnosis not present

## 2017-07-21 ENCOUNTER — Ambulatory Visit (INDEPENDENT_AMBULATORY_CARE_PROVIDER_SITE_OTHER): Payer: Medicare Other

## 2017-07-21 ENCOUNTER — Ambulatory Visit (INDEPENDENT_AMBULATORY_CARE_PROVIDER_SITE_OTHER): Payer: Medicare Other | Admitting: Physician Assistant

## 2017-07-21 VITALS — BP 162/78 | HR 82 | Temp 98.7°F | Resp 16 | Ht 69.0 in | Wt 210.0 lb

## 2017-07-21 DIAGNOSIS — S8991XA Unspecified injury of right lower leg, initial encounter: Secondary | ICD-10-CM | POA: Diagnosis not present

## 2017-07-21 DIAGNOSIS — W19XXXA Unspecified fall, initial encounter: Secondary | ICD-10-CM | POA: Diagnosis not present

## 2017-07-21 DIAGNOSIS — M25561 Pain in right knee: Secondary | ICD-10-CM

## 2017-07-21 DIAGNOSIS — R0781 Pleurodynia: Secondary | ICD-10-CM | POA: Diagnosis not present

## 2017-07-21 DIAGNOSIS — I10 Essential (primary) hypertension: Secondary | ICD-10-CM

## 2017-07-21 DIAGNOSIS — Z96651 Presence of right artificial knee joint: Secondary | ICD-10-CM | POA: Diagnosis not present

## 2017-07-21 DIAGNOSIS — S299XXA Unspecified injury of thorax, initial encounter: Secondary | ICD-10-CM | POA: Diagnosis not present

## 2017-07-21 MED ORDER — LOSARTAN POTASSIUM 100 MG PO TABS: 100.0000 mg | ORAL_TABLET | Freq: Every day | ORAL | 0 refills | Status: DC

## 2017-07-21 MED ORDER — LOSARTAN POTASSIUM 50 MG PO TABS: 100.0000 mg | ORAL_TABLET | Freq: Every day | ORAL | 0 refills | Status: DC

## 2017-07-21 NOTE — Progress Notes (Signed)
PRIMARY CARE AT [facility] [address], [place] [address] [phone]  Date:  07/21/2017   Name:  Fidel Caggiano   DOB:  Jan 28, 1946   MRN:  [id number]  PCP:  Wendie Agreste, MD    History of Present Illness:  Boysie Bonebrake is a 71 y.o. male patient who presents to PCP with  Chief Complaint  Patient presents with  . Fall    yesterday/ pt has hx of falls  . Knee Pain    x 1 day/ both  . Flank Pain    ribs, left side     Yesterday, he was downstairs with his pet, and caught foot on rug and fell.  He fell on both knees, and landed on his side.  No loc, incontinence.  He had swelling on his right knee which resolved.  He put pressure bandage on it.  This runner (rug), has been there.   The bottom of his left toes reports paresthesia.  No dizziness.  No dysequlibrium.  No blurry vision.  No vision changes.  He did not have on his reading glasses at the time of incident.  Painful to take deep inspirations.  No bruising that he can see on his rib.  No coughing.  No sob.   He cut his hand on a stick one week ago, and tore the wound.   He has a different gait which wife is concerned of.  He "walks forward".   He states after repeated knee surgeries, he developed rounded toes in an attempt to catch himself.  This never improved.  Patient reports no palpitations, or sob.    Patient does recognize his weight loss.  He is watching what he eats.  Intentional weight loss.    2 beers per week, at separate times.  Never consumed prior to drinks  Wt Readings from Last 3 Encounters:  07/21/17 210 lb (95.3 kg)  02/02/17 214 lb (97.1 kg)  01/27/17 216 lb 6.4 oz (98.2 kg)     Patient Active Problem List   Diagnosis Date Noted  . Solitary pulmonary nodule 10/03/2016  . Cough 09/29/2016    Past Medical History:  Diagnosis Date  . Arthritis   . Chronic kidney disease   . Depression   . Hypertension     Past Surgical History:  Procedure Laterality Date  . CATARACT EXTRACTION,  BILATERAL    . JOINT REPLACEMENT      Social History  Substance Use Topics  . Smoking status: Former Research scientist (life sciences)  . Smokeless tobacco: Never Used  . Alcohol use 2.4 oz/week    4 Standard drinks or equivalent per week     Comment: rarely    Family History  Problem Relation Age of Onset  . Cancer Mother        throat  . Hypertension Mother   . Heart disease Mother   . Emphysema Mother   . Hypertension Father   . Cancer Father        Brain  . Hypertension Sister     Allergies  Allergen Reactions  . Dilaudid [Hydromorphone Hcl]     Medication list has been reviewed and updated.  Current Outpatient Prescriptions on File Prior to Visit  Medication Sig Dispense Refill  . acetaminophen (TYLENOL) 500 MG tablet Take 500 mg by mouth every 6 (six) hours as needed.    Marland Kitchen buPROPion (WELLBUTRIN SR) 200 MG 12 hr tablet TAKE 1 TABLET TWICE A DAY 180 tablet 2  . losartan (COZAAR) 50  MG tablet Take 1 tablet (50 mg total) by mouth daily. 90 tablet 3   No current facility-administered medications on file prior to visit.     ROS ROS otherwise unremarkable unless listed above.  Physical Examination: BP (!) 162/78   Pulse 82   Temp 98.7 F (37.1 C) (Oral)   Resp 16   Ht '5\' 9"'  (1.753 m)   Wt 210 lb (95.3 kg)   SpO2 99%   BMI 31.01 kg/m  Ideal Body Weight: Weight in (lb) to have BMI = 25: 168.9  Physical Exam  Constitutional: He is oriented to person, place, and time. He appears well-developed and well-nourished. No distress.  HENT:  Head: Normocephalic and atraumatic.  Eyes: Pupils are equal, round, and reactive to light. Conjunctivae and EOM are normal.  Cardiovascular: Normal rate.   Pulmonary/Chest: Effort normal. No respiratory distress.  Musculoskeletal:       Right knee: He exhibits decreased range of motion (secondary to hardware placement) and deformity. He exhibits no LCL laxity and no MCL laxity. No medial joint line and no lateral joint line tenderness noted.  No  tenderness at the the medial and lateral area of the right knee joints. Anterior abrasion with minimal tenderness.  Anterior lateral rib tenderness along the last 4 ribs.  No crepitus, or pain with shifting.  No ecchymosis appreciated.   Feet:  Right Foot:  Protective Sensation: 6 sites tested. 6 sites sensed.  Neurological: He is alert and oriented to person, place, and time.  Skin: Skin is warm and dry. He is not diaphoretic.  The right toes stay in flexion at rest.  Gait is slightly antalgic.     Psychiatric: He has a normal mood and affect. His behavior is normal.    Dg Ribs Unilateral W/chest Left  Result Date: 07/21/2017 CLINICAL DATA:  Left side rib pain status post fall EXAM: LEFT RIBS AND CHEST - 3+ VIEW COMPARISON:  None. FINDINGS: No fracture or dislocation are seen involving the ribs. There is no evidence of pneumothorax or pleural effusion. There is no focal infiltrate, pulmonary edema or pleural effusion. Heart size and mediastinal contours are within normal limits. IMPRESSION: No acute fracture or dislocation. Electronically Signed   By: Abelardo Diesel M.D.   On: 07/21/2017 17:53   Dg Knee Complete 4 Views Right  Result Date: 07/21/2017 CLINICAL DATA:  Patient with right knee injury status post fall. EXAM: RIGHT KNEE - COMPLETE 4+ VIEW COMPARISON:  None. FINDINGS: Patient status post right knee arthroplasty. There is a transverse lucency through the proximal tibia. Large amount of ossification about the distal femur. Soft tissues are unremarkable. IMPRESSION: There is a transverse lucency through the proximal tibia concerning for acute fracture. Right knee arthroplasty. These results will be called to the ordering clinician or representative by the Radiologist Assistant, and communication documented in the PACS or zVision Dashboard. Electronically Signed   By: Lovey Newcomer M.D.   On: 07/21/2017 18:03     Assessment and Plan: Keawe Marcello is a 71 y.o. male who is here today   Due to possible fracture and question of timing to assess by specialist, on call ortho was contacted.  Dr. Percell Miller had advised immobilization and non-weight bearing, to toe touch bearing if he is prone to falls.  After further discussion and assessment of notes, he has seen [employer], with the most current of right knee imaging possibly not on epic.  Advised we would contact him for referral. Treatment plan discussed and  physical exam also performed with Dr. Carlota Raspberry.  After hx of falls, neurology assessment warranted.  This is place today as well.  We can then decide occupational therapy terms with more data.    History of right knee joint replacement - Plan: AMB referral to orthopedics  Fall, initial encounter - Plan: DG Ribs Unilateral W/Chest Left, DG Knee Complete 4 Views Right, CMP14+EGFR, CBC with Differential/Platelet, Ambulatory referral to Neurology  Acute pain of right knee - Plan: DG Knee Complete 4 Views Right, AMB referral to orthopedics  Rib pain - Plan: DG Ribs Unilateral W/Chest Left  Essential hypertension - Plan: CMP14+EGFR, losartan (COZAAR) 50 MG tablet, CBC with Differential/Platelet  falls - Plan: Ambulatory referral to Neurology  Ivar Drape, PA-C [facility] 10/25/20186:22 PM

## 2017-07-22 ENCOUNTER — Telehealth: Payer: Self-pay | Admitting: Physician Assistant

## 2017-07-22 ENCOUNTER — Other Ambulatory Visit: Payer: Self-pay | Admitting: Family Medicine

## 2017-07-22 ENCOUNTER — Ambulatory Visit
Admission: RE | Admit: 2017-07-22 | Discharge: 2017-07-22 | Disposition: A | Discharge status: Home / Self Care | Payer: Medicare Other | Source: Ambulatory Visit | Attending: Family Medicine | Admitting: Family Medicine

## 2017-07-22 DIAGNOSIS — S8991XA Unspecified injury of right lower leg, initial encounter: Secondary | ICD-10-CM | POA: Diagnosis not present

## 2017-07-22 DIAGNOSIS — M25561 Pain in right knee: Secondary | ICD-10-CM | POA: Diagnosis not present

## 2017-07-22 LAB — CBC WITH DIFFERENTIAL/PLATELET
BASOS: 0 %
Basophils Absolute: 0 10*3/uL (ref 0.0–0.2)
EOS (ABSOLUTE): 0.4 10*3/uL (ref 0.0–0.4)
EOS: 5 %
HEMATOCRIT: 43.6 % (ref 37.5–51.0)
Hemoglobin: 15 g/dL (ref 13.0–17.7)
IMMATURE GRANS (ABS): 0 10*3/uL (ref 0.0–0.1)
IMMATURE GRANULOCYTES: 0 %
LYMPHS: 26 %
Lymphocytes Absolute: 1.9 10*3/uL (ref 0.7–3.1)
MCH: 30.9 pg (ref 26.6–33.0)
MCHC: 34.4 g/dL (ref 31.5–35.7)
MCV: 90 fL (ref 79–97)
Monocytes Absolute: 0.6 10*3/uL (ref 0.1–0.9)
Monocytes: 8 %
NEUTROS PCT: 61 %
Neutrophils Absolute: 4.4 10*3/uL (ref 1.4–7.0)
Platelets: 263 10*3/uL (ref 150–379)
RBC: 4.85 x10E6/uL (ref 4.14–5.80)
RDW: 13.8 % (ref 12.3–15.4)
WBC: 7.3 10*3/uL (ref 3.4–10.8)

## 2017-07-22 LAB — CMP14+EGFR
ALT: 19 IU/L (ref 0–44)
AST: 20 IU/L (ref 0–40)
Albumin/Globulin Ratio: 2.4 — ABNORMAL HIGH (ref 1.2–2.2)
Albumin: 4.7 g/dL (ref 3.6–4.8)
Alkaline Phosphatase: 98 IU/L (ref 39–117)
BUN/Creatinine Ratio: 18 (ref 10–24)
BUN: 24 mg/dL (ref 8–27)
Bilirubin Total: <0.2 mg/dL (ref 0.0–1.2)
CO2: 29 mmol/L (ref 20–29)
CREATININE: 1.36 mg/dL — AB (ref 0.76–1.27)
Calcium: 10.1 mg/dL (ref 8.6–10.2)
Chloride: 105 mmol/L (ref 96–106)
GFR, EST AFRICAN AMERICAN: 61 mL/min/{1.73_m2} (ref >59)
GFR, EST NON AFRICAN AMERICAN: 53 mL/min/{1.73_m2} — AB (ref >59)
GLOBULIN, TOTAL: 2 g/dL (ref 1.5–4.5)
GLUCOSE: 89 mg/dL (ref 65–99)
Potassium: 5.8 mmol/L — ABNORMAL HIGH (ref 3.5–5.2)
SODIUM: 144 mmol/L (ref 134–144)
Total Protein: 6.7 g/dL (ref 6.0–8.5)

## 2017-07-22 NOTE — Telephone Encounter (Signed)
Contacted [place] to contact patient of appointment at [facility] for 2pm appointment, be there at 130pm to see Dr. Rip Harbour.  Thank you. --For assessment of right knee injury.  Office visit 07/21/2017

## 2017-07-22 NOTE — Telephone Encounter (Signed)
Pt advised.

## 2017-07-25 ENCOUNTER — Other Ambulatory Visit: Payer: Self-pay | Admitting: Physician Assistant

## 2017-07-25 DIAGNOSIS — E875 Hyperkalemia: Secondary | ICD-10-CM

## 2017-07-26 ENCOUNTER — Encounter: Payer: Self-pay | Admitting: Physician Assistant

## 2017-07-26 DIAGNOSIS — M25561 Pain in right knee: Secondary | ICD-10-CM | POA: Diagnosis not present

## 2017-07-29 ENCOUNTER — Ambulatory Visit (INDEPENDENT_AMBULATORY_CARE_PROVIDER_SITE_OTHER): Payer: Medicare Other | Admitting: Physician Assistant

## 2017-07-29 DIAGNOSIS — E875 Hyperkalemia: Secondary | ICD-10-CM | POA: Diagnosis not present

## 2017-07-30 LAB — BASIC METABOLIC PANEL
BUN/Creatinine Ratio: 18 (ref 10–24)
BUN: 26 mg/dL (ref 8–27)
CALCIUM: 9.5 mg/dL (ref 8.6–10.2)
CHLORIDE: 105 mmol/L (ref 96–106)
CO2: 22 mmol/L (ref 20–29)
CREATININE: 1.48 mg/dL — AB (ref 0.76–1.27)
GFR calc Af Amer: 55 mL/min/{1.73_m2} — ABNORMAL LOW (ref >59)
GFR calc non Af Amer: 47 mL/min/{1.73_m2} — ABNORMAL LOW (ref >59)
GLUCOSE: 83 mg/dL (ref 65–99)
Potassium: 5.3 mmol/L — ABNORMAL HIGH (ref 3.5–5.2)
Sodium: 140 mmol/L (ref 134–144)

## 2017-08-08 ENCOUNTER — Ambulatory Visit (INDEPENDENT_AMBULATORY_CARE_PROVIDER_SITE_OTHER): Payer: Medicare Other

## 2017-08-08 VITALS — BP 136/82 | HR 91 | Ht 69.0 in | Wt 213.2 lb

## 2017-08-08 DIAGNOSIS — Z0000 Encounter for general adult medical examination without abnormal findings: Secondary | ICD-10-CM

## 2017-08-08 NOTE — Patient Instructions (Addendum)
[name] , Thank you for taking time to come for your Medicare Wellness Visit. I appreciate your ongoing commitment to your health goals. Please review the following plan we discussed and let me know if I can assist you in the future.   Screening recommendations/referrals: Colonoscopy: up to date, next due 09/28/2023 Recommended yearly ophthalmology/optometry visit for glaucoma screening and checkup Recommended yearly dental visit for hygiene and checkup  Vaccinations: Influenza vaccine: up to date Pneumococcal vaccine: up to date Tdap vaccine: up to date, next due 02/08/2025 Shingles vaccine: up to date    Advanced directives: Please bring a copy of your POA (Power of [place]) and/or Living Will to your next appointment.   Conditions/risks identified: Try to start doing more physical therapy/exercise on a weekly basis to help strengthen your knees and legs.  Next appointment: 08/23/17 @ 11:40 am with Dr. Carlota Raspberry  Preventive Care 70 Years and Older, Male Preventive care refers to lifestyle choices and visits with your health care provider that can promote health and wellness. What does preventive care include?  A yearly physical exam. This is also called an annual well check.  Dental exams once or twice a year.  Routine eye exams. Ask your health care provider how often you should have your eyes checked.  Personal lifestyle choices, including:  Daily care of your teeth and gums.  Regular physical activity.  Eating a healthy diet.  Avoiding tobacco and drug use.  Limiting alcohol use.  Practicing safe sex.  Taking low doses of aspirin every day.  Taking vitamin and mineral supplements as recommended by your health care provider. What happens during an annual well check? The services and screenings done by your health care provider during your annual well check will depend on your age, overall health, lifestyle risk factors, and family history of disease. Counseling    Your health care provider may ask you questions about your:  Alcohol use.  Tobacco use.  Drug use.  Emotional well-being.  Home and relationship well-being.  Sexual activity.  Eating habits.  History of falls.  Memory and ability to understand (cognition).  Work and work Statistician. Screening  You may have the following tests or measurements:  Height, weight, and BMI.  Blood pressure.  Lipid and cholesterol levels. These may be checked every 5 years, or more frequently if you are over 70 years old.  Skin check.  Lung cancer screening. You may have this screening every year starting at age 70 if you have a 30-pack-year history of smoking and currently smoke or have quit within the past 15 years.  Fecal occult blood test (FOBT) of the stool. You may have this test every year starting at age 70.  Flexible sigmoidoscopy or colonoscopy. You may have a sigmoidoscopy every 5 years or a colonoscopy every 10 years starting at age 70.  Prostate cancer screening. Recommendations will vary depending on your family history and other risks.  Hepatitis C blood test.  Hepatitis B blood test.  Sexually transmitted disease (STD) testing.  Diabetes screening. This is done by checking your blood sugar (glucose) after you have not eaten for a while (fasting). You may have this done every 1-3 years.  Abdominal aortic aneurysm (AAA) screening. You may need this if you are a current or former smoker.  Osteoporosis. You may be screened starting at age 70 if you are at high risk. Talk with your health care provider about your test results, treatment options, and if necessary, the need for more  tests. Vaccines  Your health care provider may recommend certain vaccines, such as:  Influenza vaccine. This is recommended every year.  Tetanus, diphtheria, and acellular pertussis (Tdap, Td) vaccine. You may need a Td booster every 10 years.  Zoster vaccine. You may need this after age  70.  Pneumococcal 13-valent conjugate (PCV13) vaccine. One dose is recommended after age 70.  Pneumococcal polysaccharide (PPSV23) vaccine. One dose is recommended after age 70. Talk to your health care provider about which screenings and vaccines you need and how often you need them. This information is not intended to replace advice given to you by your health care provider. Make sure you discuss any questions you have with your health care provider. Document Released: 10/10/2015 Document Revised: 06/02/2016 Document Reviewed: 07/15/2015 Elsevier Interactive Patient Education  2017 [place] Prevention in the Home Falls can cause injuries. They can happen to people of all ages. There are many things you can do to make your home safe and to help prevent falls. What can I do on the outside of my home?  Regularly fix the edges of walkways and driveways and fix any cracks.  Remove anything that might make you trip as you walk through a door, such as a raised step or threshold.  Trim any bushes or trees on the path to your home.  Use bright outdoor lighting.  Clear any walking paths of anything that might make someone trip, such as rocks or tools.  Regularly check to see if handrails are loose or broken. Make sure that both sides of any steps have handrails.  Any raised decks and porches should have guardrails on the edges.  Have any leaves, snow, or ice cleared regularly.  Use sand or salt on walking paths during winter.  Clean up any spills in your garage right away. This includes oil or grease spills. What can I do in the bathroom?  Use night lights.  Install grab bars by the toilet and in the tub and shower. Do not use towel bars as grab bars.  Use non-skid mats or decals in the tub or shower.  If you need to sit down in the shower, use a plastic, non-slip stool.  Keep the floor dry. Clean up any water that spills on the floor as soon as it happens.  Remove  soap buildup in the tub or shower regularly.  Attach bath mats securely with double-sided non-slip rug tape.  Do not have throw rugs and other things on the floor that can make you trip. What can I do in the bedroom?  Use night lights.  Make sure that you have a light by your bed that is easy to reach.  Do not use any sheets or blankets that are too big for your bed. They should not hang down onto the floor.  Have a firm chair that has side arms. You can use this for support while you get dressed.  Do not have throw rugs and other things on the floor that can make you trip. What can I do in the kitchen?  Clean up any spills right away.  Avoid walking on wet floors.  Keep items that you use a lot in easy-to-reach places.  If you need to reach something above you, use a strong step stool that has a grab bar.  Keep electrical cords out of the way.  Do not use floor polish or wax that makes floors slippery. If you must use wax, use non-skid floor  wax.  Do not have throw rugs and other things on the floor that can make you trip. What can I do with my stairs?  Do not leave any items on the stairs.  Make sure that there are handrails on both sides of the stairs and use them. Fix handrails that are broken or loose. Make sure that handrails are as long as the stairways.  Check any carpeting to make sure that it is firmly attached to the stairs. Fix any carpet that is loose or worn.  Avoid having throw rugs at the top or bottom of the stairs. If you do have throw rugs, attach them to the floor with carpet tape.  Make sure that you have a light switch at the top of the stairs and the bottom of the stairs. If you do not have them, ask someone to add them for you. What else can I do to help prevent falls?  Wear shoes that:  Do not have high heels.  Have rubber bottoms.  Are comfortable and fit you well.  Are closed at the toe. Do not wear sandals.  If you use a  stepladder:  Make sure that it is fully opened. Do not climb a closed stepladder.  Make sure that both sides of the stepladder are locked into place.  Ask someone to hold it for you, if possible.  Clearly mark and make sure that you can see:  Any grab bars or handrails.  First and last steps.  Where the edge of each step is.  Use tools that help you move around (mobility aids) if they are needed. These include:  Canes.  Walkers.  Scooters.  Crutches.  Turn on the lights when you go into a dark area. Replace any light bulbs as soon as they burn out.  Set up your furniture so you have a clear path. Avoid moving your furniture around.  If any of your floors are uneven, fix them.  If there are any pets around you, be aware of where they are.  Review your medicines with your doctor. Some medicines can make you feel dizzy. This can increase your chance of falling. Ask your doctor what other things that you can do to help prevent falls. This information is not intended to replace advice given to you by your health care provider. Make sure you discuss any questions you have with your health care provider. Document Released: 07/10/2009 Document Revised: 02/19/2016 Document Reviewed: 10/18/2014 Elsevier Interactive Patient Education  2017 [employer].

## 2017-08-08 NOTE — Progress Notes (Signed)
Subjective:   Joseph Maldonado is a 71 y.o. male who presents for Medicare Annual/Subsequent preventive examination.  Review of Systems:  N/A Cardiac Risk Factors include: advanced age (>49men, >57 women);dyslipidemia;hypertension;obesity (BMI >30kg/m2);male gender     Objective:    Vitals: BP 136/82   Pulse 91   Ht 5\' 9"  (1.753 m)   Wt 213 lb 4 oz (96.7 kg)   SpO2 99%   BMI 31.49 kg/m   Body mass index is 31.49 kg/m.  Tobacco Social History   Tobacco Use  Smoking Status Former Smoker  Smokeless Tobacco Never Used     Counseling given: Not Answered   Past Medical History:  Diagnosis Date  . Arthritis   . Chronic kidney disease   . Depression   . Hypertension    Past Surgical History:  Procedure Laterality Date  . CATARACT EXTRACTION, BILATERAL    . JOINT REPLACEMENT     Family History  Problem Relation Age of Onset  . Cancer Mother        throat  . Hypertension Mother   . Heart disease Mother   . Emphysema Mother   . Hypertension Father   . Cancer Father        Brain  . Hypertension Sister    Social History   Substance and Sexual Activity  Sexual Activity No    Outpatient Encounter Medications as of 08/08/2017  Medication Sig  . acetaminophen (TYLENOL) 500 MG tablet Take 500 mg by mouth every 6 (six) hours as needed.  Marland Kitchen buPROPion (WELLBUTRIN SR) 200 MG 12 hr tablet TAKE 1 TABLET TWICE A DAY  . losartan (COZAAR) 100 MG tablet Take 1 tablet (100 mg total) by mouth daily. Fill at patient request   No facility-administered encounter medications on file as of 08/08/2017.     Activities of Daily Living In your present state of health, do you have any difficulty performing the following activities: 08/08/2017  Hearing? Y  Comment Patient wears hearing aids.   Vision? N  Difficulty concentrating or making decisions? N  Walking or climbing stairs? N  Dressing or bathing? N  Doing errands, shopping? N  Preparing Food and eating ? N  Using the  Toilet? N  In the past six months, have you accidently leaked urine? N  Do you have problems with loss of bowel control? N  Managing your Medications? N  Managing your Finances? N  Housekeeping or managing your Housekeeping? N  Some recent data might be hidden    Patient Care Team: Joseph Agreste, MD as PCP - General (Family Medicine) Joseph Pear, MD as Consulting Physician (Orthopedic Surgery)   Assessment:     Exercise Activities and Dietary recommendations Current Exercise Habits: The patient does not participate in regular exercise at present, Exercise limited by: None identified  Goals    . Increase physical therapy and exercise     Patient would like to start doing more physical therapy/exercise on a weekly basis to help strengthen his knees and legs.       Fall Risk Fall Risk  08/08/2017 07/21/2017 01/27/2017 09/15/2016 08/24/2016  Falls in the past year? Yes Yes Yes No No  Number falls in past yr: 2 or more 2 or more 1 - -  Injury with Fall? Yes Yes No - -  Comment rib injury, knee abrasion - - - -  Risk Factor Category  High Fall Risk - - - -  Risk for fall due to : Other (Comment) - - - -  Risk for fall due to: Comment Patient tripped over something each time he fell. - - - -  Follow up Falls prevention discussed - - - -   Depression Screen PHQ 2/9 Scores 08/08/2017 07/21/2017 01/27/2017 09/15/2016  PHQ - 2 Score 0 0 0 0    Cognitive Function     6CIT Screen 08/08/2017  What Year? 0 points  What month? 0 points  What time? 0 points  Count back from 20 0 points  Months in reverse 0 points  Repeat phrase 0 points  Total Score 0    Immunization History  Administered Date(s) Administered  . Influenza,inj,Quad PF,6+ Mos 07/07/2016  . Influenza-Unspecified 10/01/2015, 07/05/2017  . Pneumococcal Conjugate-13 04/10/2014  . Pneumococcal Polysaccharide-23 12/17/2015  . Pneumococcal-Unspecified 09/27/2010  . Tdap 02/09/2015  . Zoster 09/27/2010  . Zoster  Recombinat (Shingrix) 02/04/2017, 06/28/2017   Screening Tests Health Maintenance  Topic Date Due  . Hepatitis C Screening  10-27-1945  . COLONOSCOPY  09/28/2023  . TETANUS/TDAP  02/08/2025  . INFLUENZA VACCINE  Completed  . PNA vac Low Risk Adult  Completed      Plan:   I have personally reviewed and noted the following in the patient's chart:   . Medical and social history . Use of alcohol, tobacco or illicit drugs  . Current medications and supplements . Functional ability and status . Nutritional status . Physical activity . Advanced directives . List of other physicians . Hospitalizations, surgeries, and ER visits in previous 12 months . Vitals . Screenings to include cognitive, depression, and falls . Referrals and appointments  In addition, I have reviewed and discussed with patient certain preventive protocols, quality metrics, and best practice recommendations. A written personalized care plan for preventive services as well as general preventive health recommendations were provided to patient.    Patient declined blood work today. (not fasting)  Will have this done at his next visit with his PCP.  1. Encounter for Medicare annual wellness exam  Joseph Grime, LPN  85/27/7824

## 2017-08-10 DIAGNOSIS — S8001XD Contusion of right knee, subsequent encounter: Secondary | ICD-10-CM | POA: Diagnosis not present

## 2017-08-10 DIAGNOSIS — Z96651 Presence of right artificial knee joint: Secondary | ICD-10-CM | POA: Diagnosis not present

## 2017-08-10 DIAGNOSIS — M25561 Pain in right knee: Secondary | ICD-10-CM | POA: Diagnosis not present

## 2017-08-15 ENCOUNTER — Encounter: Payer: Self-pay | Admitting: Neurology

## 2017-08-15 ENCOUNTER — Ambulatory Visit (INDEPENDENT_AMBULATORY_CARE_PROVIDER_SITE_OTHER): Payer: Medicare Other | Admitting: Neurology

## 2017-08-15 VITALS — BP 130/81 | HR 82 | Ht 71.0 in | Wt 217.0 lb

## 2017-08-15 DIAGNOSIS — R5382 Chronic fatigue, unspecified: Secondary | ICD-10-CM

## 2017-08-15 DIAGNOSIS — E538 Deficiency of other specified B group vitamins: Secondary | ICD-10-CM

## 2017-08-15 DIAGNOSIS — W19XXXA Unspecified fall, initial encounter: Secondary | ICD-10-CM

## 2017-08-15 DIAGNOSIS — R200 Anesthesia of skin: Secondary | ICD-10-CM

## 2017-08-15 NOTE — Progress Notes (Signed)
[facility]    Provider:  Dr Jaynee Eagles Referring Provider: Wendie Agreste, MD Primary Care Physician:  Wendie Agreste, MD  CC:  fall  HPI:  Joseph Maldonado is a 71 y.o. male here as a referral from Dr. Carlota Raspberry for a fall.  PMHx HTN, Depression, CKD, Arthritis. He was downstairs and he heard the dog getting ready to vomit, he hurried to get the dog outside and he caught his foot and fell. No loss of consciousness, no confusion, purely a mechanical fall and he hurt his ribs. He fell last summer in the back yard and he tripped on uneven pavers, skinned his knee. Balance has been an issue and he is in PT, he has not been practicing balance over the years and has been . He has right knee instability, 5 prior surgeries or more, and weakness due to the knee. He has some numbness in the toes on both sides not progressive or causing distress. He has had 5 surgeries on the right knee. He is sedate, does not exercise. No memory problems. No other weakness, focal neuro deficits.  No Hx of parkinson's disease or dementia in the family. He has some back pain but no radiculopathy or neurogenic claudication his knee hurts after long periods but no leg pain. He reports some fatigue, No symptoms of sleep apnea.  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   CT knee:  IMPRESSION: 1. No evidence of acute fracture or dislocation. Osseous evaluation is limited by artifact related to the total knee arthroplasty. Radiographic follow up of any previous concern recommended. 2. No evidence hardware loosening. 3. No acute soft tissue findings.  Right calf muscular atrophy.   Review of Systems: Patient complains of symptoms per HPI as well as the following symptoms: depression, joint pain, hearing loss. Pertinent negatives and positives per HPI. All others negative.   Social History   Socioeconomic History  . Marital status: Married    Spouse  name: Not on file  . Number of children: Not on file  . Years of education: Not on file  . Highest education level: Bachelor's degree (e.g., BA, AB, BS)  Social Needs  . Financial resource strain: Not hard at all  . Food insecurity - worry: Never true  . Food insecurity - inability: Never true  . Transportation needs - medical: No  . Transportation needs - non-medical: No  Occupational History  . Occupation: Retired  Tobacco Use  . Smoking status: Former Smoker    Types: Cigarettes    Last attempt to quit: 1971    Years since quitting: 47.9  . Smokeless tobacco: Never Used  Substance and Sexual Activity  . Alcohol use: Yes    Alcohol/week: 1.8 oz    Types: 3 Cans of beer per week    Comment: max 3 beers/week  . Drug use: No    Comment: former user of marijuana, quit in 1971  . Sexual activity: No  Other Topics Concern  . Not on file  Social History Narrative   Married   Education: College   Exercise: No   Drinks about 2 large coffees per day   Right handed    Family History  Problem Relation Age of Onset  . Cancer Mother        throat  . Hypertension Mother   . Heart disease Mother   . Emphysema Mother   . Hypertension Father   . Cancer Father  Brain  . Hypertension Sister   . Hematuria Sister   . Hematuria Brother     Past Medical History:  Diagnosis Date  . Arthritis   . Chronic kidney disease   . Depression   . Hematuria   . Hypertension     Past Surgical History:  Procedure Laterality Date  . CATARACT EXTRACTION, BILATERAL    . HIP SURGERY  2012  . JOINT REPLACEMENT    . REPLACEMENT TOTAL KNEE Right    2001,2006,2009,2013  . REPLACEMENT TOTAL KNEE Left 2011    Current Outpatient Medications  Medication Sig Dispense Refill  . acetaminophen (TYLENOL) 500 MG tablet Take 500 mg by mouth every 6 (six) hours as needed.    Marland Kitchen buPROPion (WELLBUTRIN SR) 200 MG 12 hr tablet TAKE 1 TABLET TWICE A DAY 180 tablet 2  . losartan (COZAAR) 100 MG  tablet Take 1 tablet (100 mg total) by mouth daily. Fill at patient request 90 tablet 0   No current facility-administered medications for this visit.     Allergies as of 08/15/2017 - Review Complete 08/15/2017  Allergen Reaction Noted  . Dilaudid [hydromorphone hcl]  02/03/2015    Vitals: BP 130/81 (BP Location: Right Arm, Patient Position: Sitting)   Pulse 82   Ht 5\' 11"  (1.803 m)   Wt 217 lb (98.4 kg)   BMI 30.27 kg/m  Last Weight:  Wt Readings from Last 1 Encounters:  08/15/17 217 lb (98.4 kg)   Last Height:   Ht Readings from Last 1 Encounters:  08/15/17 5\' 11"  (1.803 m)   Physical exam: Exam: Gen: NAD, conversant, well nourised, obese, well groomed                     CV: RRR, no MRG. No Carotid Bruits. No peripheral edema, warm, nontender Eyes: Conjunctivae clear without exudates or hemorrhage  Neuro: Detailed Neurologic Exam  Speech:    Speech is normal; fluent and spontaneous with normal comprehension.  Cognition:    The patient is oriented to person, place, and time;     recent and remote memory intact;     language fluent;     normal attention, concentration,     fund of knowledge Cranial Nerves:    The pupils are equal, round, and reactive to light. Attempted fundoscopic exam could not visualize.. Visual fields are full to finger confrontation. Extraocular movements are intact. Trigeminal sensation is intact and the muscles of mastication are normal. The face is symmetric. The palate elevates in the midline. Hearing intact. Voice is normal. Shoulder shrug is normal. The tongue has normal motion without fasciculations.   Coordination:    Normal finger to nose and heel to shin. Normal rapid alternating movements.   Gait:    Antalgic  Motor Observation:    No asymmetry, no atrophy, and no involuntary movements noted. Charcot joints.  Tone:    Normal muscle tone.    Posture:    Posture is slightly stooped    Strength:    Strength is V/V in the upper  and lower limbs.      Sensation: intact to LT, pin prick and proprioception.      Reflex Exam:  DTR's:    Absent right patellar, absent AJs, otherwise Deep tendon reflexes in the upper and lower extremities are symmetric bilaterally.   Toes:    The toes are equivocal bilaterally.   Clonus:    Clonus is absent.    Assessment/Plan:  71 year old  with fall. No parkinsonism, balance normal on exam, neuro exam unremarkable, think it was just mechanical with contributions from knee instability. For numbness of toes will check a few labs for neuropathy. No follow up necessary, thanks   Orders Placed This Encounter  Procedures  . B12 and Folate Panel  . Methylmalonic acid, serum  . TSH   Cc:  Wendie Agreste, MD   Sarina Ill, MD  [facility] [address] [place], [place] [address]  Phone [phone] Fax [phone]

## 2017-08-15 NOTE — Patient Instructions (Addendum)
Remember to drink plenty of fluid, eat healthy meals and do not skip any meals. Try to eat protein with a every meal and eat a healthy snack such as fruit or nuts in between meals. Try to keep a regular sleep-wake schedule and try to exercise daily, particularly in the form of walking, 20-30 minutes a day, if you can.   As far as diagnostic testing: Labs  I would like to see you back as needed, sooner if we need to. Please call us with any interim questions, concerns, problems, updates or refill requests.   Our phone number is [phone]. We also have an after hours call service for urgent matters and there is a physician on-call for urgent questions. For any emergencies you know to call 911 or go to the nearest emergency room   

## 2017-08-17 ENCOUNTER — Telehealth: Payer: Self-pay | Admitting: *Deleted

## 2017-08-17 LAB — METHYLMALONIC ACID, SERUM: METHYLMALONIC ACID: 280 nmol/L (ref 0–378)

## 2017-08-17 LAB — B12 AND FOLATE PANEL
FOLATE: 16.6 ng/mL (ref >3.0)
VITAMIN B 12: 382 pg/mL (ref 232–1245)

## 2017-08-17 LAB — TSH: TSH: 3 u[IU]/mL (ref 0.450–4.500)

## 2017-08-17 NOTE — Telephone Encounter (Signed)
Called patient, he was not available. I spoke with pt's wife Beverlee Nims (on [place]) and let her know that Pearl's labs are normal. She verbalized understanding and appreciation and had no questions.

## 2017-08-17 NOTE — Telephone Encounter (Signed)
-----   Message from Melvenia Beam, MD sent at 08/17/2017 11:10 AM EST ----- Labs normal thanks

## 2017-08-23 ENCOUNTER — Other Ambulatory Visit: Payer: Self-pay

## 2017-08-23 ENCOUNTER — Ambulatory Visit (INDEPENDENT_AMBULATORY_CARE_PROVIDER_SITE_OTHER): Payer: Medicare Other | Admitting: Family Medicine

## 2017-08-23 ENCOUNTER — Encounter: Payer: Self-pay | Admitting: Family Medicine

## 2017-08-23 VITALS — BP 122/72 | HR 64 | Temp 97.6°F | Resp 16 | Ht 71.25 in | Wt 210.0 lb

## 2017-08-23 DIAGNOSIS — R0789 Other chest pain: Secondary | ICD-10-CM

## 2017-08-23 DIAGNOSIS — Z96651 Presence of right artificial knee joint: Secondary | ICD-10-CM | POA: Diagnosis not present

## 2017-08-23 DIAGNOSIS — R0781 Pleurodynia: Secondary | ICD-10-CM | POA: Diagnosis not present

## 2017-08-23 DIAGNOSIS — N183 Chronic kidney disease, stage 3 unspecified: Secondary | ICD-10-CM

## 2017-08-23 DIAGNOSIS — M25561 Pain in right knee: Secondary | ICD-10-CM | POA: Diagnosis not present

## 2017-08-23 DIAGNOSIS — E875 Hyperkalemia: Secondary | ICD-10-CM

## 2017-08-23 DIAGNOSIS — S8001XD Contusion of right knee, subsequent encounter: Secondary | ICD-10-CM | POA: Diagnosis not present

## 2017-08-23 DIAGNOSIS — F329 Major depressive disorder, single episode, unspecified: Secondary | ICD-10-CM | POA: Diagnosis not present

## 2017-08-23 DIAGNOSIS — F32A Depression, unspecified: Secondary | ICD-10-CM

## 2017-08-23 DIAGNOSIS — I10 Essential (primary) hypertension: Secondary | ICD-10-CM | POA: Diagnosis not present

## 2017-08-23 NOTE — Patient Instructions (Addendum)
Continue balance and strengthening through PT and follow up with ortho. If any changes in balances or unsteadiness, return to look into other causes.   For kidney disease, I will refer you to nephrologist locally. Continue to avoid NSAIDS and make sure you are drinking plenty of fluids.   Call Dr. Bing Plume about vision change.   Follow up in January with Dr. Melvyn Novas (pulmonologist) for pulmonary nodule.   Return to the clinic or go to the nearest emergency room if any of your symptoms worsen or new symptoms occur.   IF you received an x-ray today, you will receive an invoice from [facility]. Please contact [facility] at [phone] with questions or concerns regarding your invoice.   IF you received labwork today, you will receive an invoice from [facility]. Please contact [facility] at [phone] with questions or concerns regarding your invoice.   Our billing staff will not be able to assist you with questions regarding bills from these companies.  You will be contacted with the lab results as soon as they are available. The fastest way to get your results is to activate your My Chart account. Instructions are located on the last page of this paperwork. If you have not heard from [name] regarding the results in 2 weeks, please contact this office.

## 2017-08-23 NOTE — Progress Notes (Signed)
Subjective:  By signing my name below, I, Essence Howell, attest that this documentation has been prepared under the direction and in the presence of Wendie Agreste, MD  Electronically Signed: Ladene Artist, ED Scribe 08/23/2017 at 12:16 PM.   Patient ID: Joseph Maldonado, male    DOB: 03/11/1946, 71 y.o.   MRN: [id number]  Chief Complaint  Patient presents with  . Follow-up    10/25 visit, rib pain, right knee pain and essential hypertension   HPI Joseph Maldonado is a 71 y.o. male who presents to Primary Care at [facility] for follow-up.   Fall Fall on 10/24. Seen 10/25. Caught his foot on a rug and feel onto both knees and landed on R side. Initial R knee swelling that improved. Was having some pain over his R ribs. H/o R knee replacement. There was a mention of a different gait that his wife noted. Some concern of risk of falls at last visit. Referred to neuro. XR of R knee showed transverse lucency to proximal tibia but intact arthroplasty on 10/25. Seen by ortho on 10/26 Dr. Rip Harbour. CT of knee performed on 10/26 without evidence of fracture. No acute soft tissue findings or evidence of hardware loosening. Evaluated by neuro 11/19. Unremarkable neuro exam. Balance appeared normal. Thought that fall was mechanical with contributions from knee instability. He did have some numbness of toes but normal MMA, TSH, B12 and folate. Negative rib series for fracture 10/25.   Pt is starting PT at [facility] for balance and strength. States he feels like his gait and steadiness is doing fine. Denies fall since last visit. Pt also reports that rib pain has resolved.   HTN Losartan 100 mg qd.  Lab Results  Component Value Date   CREATININE 1.48 (H) 07/29/2017  H/o elevated creatinine with range of 1.36-1.48 over the past 1 year. Most recent eGFR 47. Pt does not check his BP at home. States his nephrologist suspected CKD was due to chronic NSAID use and dehydration. Pt has not followed up with  nephrology in ~3 years. States he started taking prescription strength Motrin bid since 1980s for knee pain. Last NSAID use was ~7-8 years ago. Denies cp, sob, new cough, light-headedness, dizziness, HAs, blood in stools, melena, any other side-effects.  Hyperkalemia Potassium 5.8 on 10/25 labs. Repeated with some improvement to 5.3 on 11/2.   Depression  Wellbutrin 200 mg bid. States he is doing well at this dose. Denies side-effects.  Cataracts  Pt has noticed double vision to the L eye that he plans to follow-up with Dr. Bing Plume for. Pt is considering cataract surgery.  Patient Active Problem List   Diagnosis Date Noted  . Solitary pulmonary nodule 10/03/2016  . Cough 09/29/2016   Past Medical History:  Diagnosis Date  . Arthritis   . Chronic kidney disease   . Depression   . Hematuria   . Hypertension    Past Surgical History:  Procedure Laterality Date  . CATARACT EXTRACTION, BILATERAL    . HIP SURGERY  2012  . JOINT REPLACEMENT    . REPLACEMENT TOTAL KNEE Right    2001,2006,2009,2013  . REPLACEMENT TOTAL KNEE Left 2011   Allergies  Allergen Reactions  . Dilaudid [Hydromorphone Hcl]    Prior to Admission medications   Medication Sig Start Date End Date Taking? Authorizing Provider  acetaminophen (TYLENOL) 500 MG tablet Take 500 mg by mouth every 6 (six) hours as needed.    [provider]  buPROPion [facility] SR)  200 MG 12 hr tablet TAKE 1 TABLET TWICE A DAY 07/29/16   Wendie Agreste, MD  losartan (COZAAR) 100 MG tablet Take 1 tablet (100 mg total) by mouth daily. Fill at patient request 07/21/17   Joretta Bachelor, PA   Social History   Socioeconomic History  . Marital status: Married    Spouse name: Not on file  . Number of children: Not on file  . Years of education: Not on file  . Highest education level: Bachelor's degree (e.g., BA, AB, BS)  Social Needs  . Financial resource strain: Not hard at all  . Food insecurity - worry: Never true    . Food insecurity - inability: Never true  . Transportation needs - medical: No  . Transportation needs - non-medical: No  Occupational History  . Occupation: Retired  Tobacco Use  . Smoking status: Former Smoker    Types: Cigarettes    Last attempt to quit: 1971    Years since quitting: 47.9  . Smokeless tobacco: Never Used  Substance and Sexual Activity  . Alcohol use: Yes    Alcohol/week: 1.8 oz    Types: 3 Cans of beer per week    Comment: max 3 beers/week  . Drug use: No    Comment: former user of marijuana, quit in 1971  . Sexual activity: No  Other Topics Concern  . Not on file  Social History Narrative   Married   Education: College   Exercise: No   Drinks about 2 large coffees per day   Right handed   Review of Systems  Constitutional: Negative for fatigue and unexpected weight change.  Eyes: Positive for visual disturbance (L eye).  Respiratory: Negative for cough, chest tightness and shortness of breath.   Cardiovascular: Negative for chest pain, palpitations and leg swelling.  Gastrointestinal: Negative for abdominal pain and blood in stool.  Musculoskeletal: Negative for gait problem and myalgias.  Neurological: Negative for dizziness, light-headedness and headaches.  Psychiatric/Behavioral: Negative for dysphoric mood.      Objective:   Physical Exam  Constitutional: He is oriented to person, place, and time. He appears well-developed and well-nourished.  HENT:  Head: Normocephalic and atraumatic.  Eyes: EOM are normal. Pupils are equal, round, and reactive to light.  Neck: No JVD present. Carotid bruit is not present.  Cardiovascular: Normal rate, regular rhythm and normal heart sounds.  No murmur heard. Pulmonary/Chest: Effort normal and breath sounds normal. He has no rales. He exhibits no tenderness.  Musculoskeletal: He exhibits no edema.  Pain free ROM of knees.  Neurological: He is alert and oriented to person, place, and time.  Skin: Skin is  warm and dry.  Psychiatric: He has a normal mood and affect.  Vitals reviewed.  Vitals:   08/23/17 1158  BP: 122/72  Pulse: 64  Resp: 16  Temp: 97.6 F (36.4 C)  TempSrc: Oral  SpO2: 100%  Weight: 210 lb (95.3 kg)  Height: 5' 11.25" (1.81 m)      Assessment & Plan:  Joseph Maldonado is a 71 y.o. male Essential hypertension CKD (chronic kidney disease) stage 3, GFR 30-59 ml/min (HCC) - Plan: Basic metabolic panel, Ambulatory referral to Nephrology  - stable. Continue same dose losartan.   - avoid nsaids, maintain hydration, refer to nephology.   Right knee pain, unspecified chronicity  - followed by ortho, reassuring CT. Continue PT  Hyperkalemia - Plan: Basic metabolic panel, Ambulatory referral to Nephrology  - repeat BMP.   Rib  pain on right side  - improved, suspected rib contusion.   Depression, unspecified depression type  - stable on Wellbutrin. No change in meds.   No orders of the defined types were placed in this encounter.  Patient Instructions   Continue balance and strengthening through PT and follow up with ortho. If any changes in balances or unsteadiness, return to look into other causes.   For kidney disease, I will refer you to nephrologist locally. Continue to avoid NSAIDS and make sure you are drinking plenty of fluids.   Call Dr. Bing Plume about vision change.   Follow up in January with Dr. Melvyn Novas (pulmonologist) for pulmonary nodule.   Return to the clinic or go to the nearest emergency room if any of your symptoms worsen or new symptoms occur.   IF you received an x-ray today, you will receive an invoice from [facility]. Please contact [facility] at [phone] with questions or concerns regarding your invoice.   IF you received labwork today, you will receive an invoice from [facility]. Please contact [facility] at [phone] with questions or concerns regarding your invoice.   Our billing staff will not be able to assist you  with questions regarding bills from these companies.  You will be contacted with the lab results as soon as they are available. The fastest way to get your results is to activate your My Chart account. Instructions are located on the last page of this paperwork. If you have not heard from [name] regarding the results in 2 weeks, please contact this office.      I personally performed the services described in this documentation, which was scribed in my presence. The recorded information has been reviewed and considered for accuracy and completeness, addended by me as needed, and agree with information above.  Signed,   [name], MD Primary [facility] at [place].  08/25/17 4:25 PM

## 2017-08-24 LAB — BASIC METABOLIC PANEL
BUN / CREAT RATIO: 22 (ref 10–24)
BUN: 30 mg/dL — AB (ref 8–27)
CALCIUM: 10.2 mg/dL (ref 8.6–10.2)
CHLORIDE: 103 mmol/L (ref 96–106)
CO2: 22 mmol/L (ref 20–29)
CREATININE: 1.39 mg/dL — AB (ref 0.76–1.27)
GFR calc non Af Amer: 51 mL/min/{1.73_m2} — ABNORMAL LOW (ref >59)
GFR, EST AFRICAN AMERICAN: 59 mL/min/{1.73_m2} — AB (ref >59)
Glucose: 88 mg/dL (ref 65–99)
Potassium: 4.8 mmol/L (ref 3.5–5.2)
Sodium: 139 mmol/L (ref 134–144)

## 2017-08-29 DIAGNOSIS — S8001XD Contusion of right knee, subsequent encounter: Secondary | ICD-10-CM | POA: Diagnosis not present

## 2017-08-29 DIAGNOSIS — Z96651 Presence of right artificial knee joint: Secondary | ICD-10-CM | POA: Diagnosis not present

## 2017-08-29 DIAGNOSIS — M25561 Pain in right knee: Secondary | ICD-10-CM | POA: Diagnosis not present

## 2017-08-31 DIAGNOSIS — H35033 Hypertensive retinopathy, bilateral: Secondary | ICD-10-CM | POA: Diagnosis not present

## 2017-08-31 DIAGNOSIS — H1850 Unspecified hereditary corneal dystrophies: Secondary | ICD-10-CM | POA: Diagnosis not present

## 2017-08-31 DIAGNOSIS — H26493 Other secondary cataract, bilateral: Secondary | ICD-10-CM | POA: Diagnosis not present

## 2017-08-31 DIAGNOSIS — H5203 Hypermetropia, bilateral: Secondary | ICD-10-CM | POA: Diagnosis not present

## 2017-08-31 DIAGNOSIS — H353111 Nonexudative age-related macular degeneration, right eye, early dry stage: Secondary | ICD-10-CM | POA: Diagnosis not present

## 2017-08-31 DIAGNOSIS — H524 Presbyopia: Secondary | ICD-10-CM | POA: Diagnosis not present

## 2017-09-01 DIAGNOSIS — M25561 Pain in right knee: Secondary | ICD-10-CM | POA: Diagnosis not present

## 2017-09-01 DIAGNOSIS — S8001XD Contusion of right knee, subsequent encounter: Secondary | ICD-10-CM | POA: Diagnosis not present

## 2017-09-01 DIAGNOSIS — H353111 Nonexudative age-related macular degeneration, right eye, early dry stage: Secondary | ICD-10-CM | POA: Diagnosis not present

## 2017-09-01 DIAGNOSIS — H1850 Unspecified hereditary corneal dystrophies: Secondary | ICD-10-CM | POA: Diagnosis not present

## 2017-09-01 DIAGNOSIS — H35033 Hypertensive retinopathy, bilateral: Secondary | ICD-10-CM | POA: Diagnosis not present

## 2017-09-01 DIAGNOSIS — Z96651 Presence of right artificial knee joint: Secondary | ICD-10-CM | POA: Diagnosis not present

## 2017-09-01 DIAGNOSIS — H26493 Other secondary cataract, bilateral: Secondary | ICD-10-CM | POA: Diagnosis not present

## 2017-09-01 DIAGNOSIS — H524 Presbyopia: Secondary | ICD-10-CM | POA: Diagnosis not present

## 2017-09-01 DIAGNOSIS — H5203 Hypermetropia, bilateral: Secondary | ICD-10-CM | POA: Diagnosis not present

## 2017-09-07 ENCOUNTER — Other Ambulatory Visit: Payer: Self-pay

## 2017-09-07 ENCOUNTER — Other Ambulatory Visit: Payer: Self-pay | Admitting: Family Medicine

## 2017-09-07 DIAGNOSIS — G4700 Insomnia, unspecified: Secondary | ICD-10-CM

## 2017-09-07 MED ORDER — BUPROPION HCL ER (SR) 200 MG PO TB12: ORAL_TABLET | ORAL | 2 refills | Status: DC

## 2017-09-08 DIAGNOSIS — S8001XD Contusion of right knee, subsequent encounter: Secondary | ICD-10-CM | POA: Diagnosis not present

## 2017-09-08 DIAGNOSIS — Z96651 Presence of right artificial knee joint: Secondary | ICD-10-CM | POA: Diagnosis not present

## 2017-09-08 DIAGNOSIS — M25561 Pain in right knee: Secondary | ICD-10-CM | POA: Diagnosis not present

## 2017-09-13 DIAGNOSIS — M25561 Pain in right knee: Secondary | ICD-10-CM | POA: Diagnosis not present

## 2017-09-13 DIAGNOSIS — S8001XD Contusion of right knee, subsequent encounter: Secondary | ICD-10-CM | POA: Diagnosis not present

## 2017-09-13 DIAGNOSIS — Z96651 Presence of right artificial knee joint: Secondary | ICD-10-CM | POA: Diagnosis not present

## 2017-09-15 DIAGNOSIS — H353111 Nonexudative age-related macular degeneration, right eye, early dry stage: Secondary | ICD-10-CM | POA: Diagnosis not present

## 2017-09-15 DIAGNOSIS — M25561 Pain in right knee: Secondary | ICD-10-CM | POA: Diagnosis not present

## 2017-09-15 DIAGNOSIS — H26492 Other secondary cataract, left eye: Secondary | ICD-10-CM | POA: Diagnosis not present

## 2017-09-15 DIAGNOSIS — Z96651 Presence of right artificial knee joint: Secondary | ICD-10-CM | POA: Diagnosis not present

## 2017-09-15 DIAGNOSIS — S8001XD Contusion of right knee, subsequent encounter: Secondary | ICD-10-CM | POA: Diagnosis not present

## 2017-09-15 DIAGNOSIS — H1850 Unspecified hereditary corneal dystrophies: Secondary | ICD-10-CM | POA: Diagnosis not present

## 2017-09-15 DIAGNOSIS — H35033 Hypertensive retinopathy, bilateral: Secondary | ICD-10-CM | POA: Diagnosis not present

## 2017-09-21 DIAGNOSIS — D485 Neoplasm of uncertain behavior of skin: Secondary | ICD-10-CM | POA: Diagnosis not present

## 2017-09-21 DIAGNOSIS — D225 Melanocytic nevi of trunk: Secondary | ICD-10-CM | POA: Diagnosis not present

## 2017-09-21 DIAGNOSIS — D1801 Hemangioma of skin and subcutaneous tissue: Secondary | ICD-10-CM | POA: Diagnosis not present

## 2017-09-21 DIAGNOSIS — L814 Other melanin hyperpigmentation: Secondary | ICD-10-CM | POA: Diagnosis not present

## 2017-09-21 DIAGNOSIS — L821 Other seborrheic keratosis: Secondary | ICD-10-CM | POA: Diagnosis not present

## 2017-09-26 ENCOUNTER — Ambulatory Visit (INDEPENDENT_AMBULATORY_CARE_PROVIDER_SITE_OTHER): Payer: Medicare Other | Admitting: Physician Assistant

## 2017-09-26 VITALS — BP 128/83 | HR 70 | Temp 98.1°F | Resp 16 | Ht 71.0 in | Wt 210.0 lb

## 2017-09-26 DIAGNOSIS — J0140 Acute pansinusitis, unspecified: Secondary | ICD-10-CM

## 2017-09-26 MED ORDER — AMOXICILLIN-POT CLAVULANATE 875-125 MG PO TABS: 1.0000 | ORAL_TABLET | Freq: Two times a day (BID) | ORAL | 0 refills | Status: AC

## 2017-09-26 NOTE — Progress Notes (Signed)
PRIMARY CARE AT [facility] [address], [place] [address] [phone]  Date:  09/26/2017   Name:  Joseph Maldonado   DOB:  [birth year]   MRN:  [id number]  PCP:  Wendie Agreste, MD    History of Present Illness:  Joseph Maldonado is a 71 y.o. male patient who presents to PCP with  Chief Complaint  Patient presents with  . Sinusitis    x 10 days     A couple weeks of sinus pressure and eye pain.  Green thick mucus.  No coughing.  No ear discomfort.  No sob or dyspnea. He has tried tylenol cold and sinus which sort of cleared it up, but the symptoms resurfaced.  He has no fever.   Patient Active Problem List   Diagnosis Date Noted  . Solitary pulmonary nodule 10/03/2016  . Cough 09/29/2016    Past Medical History:  Diagnosis Date  . Arthritis   . Chronic kidney disease   . Depression   . Hematuria   . Hypertension     Past Surgical History:  Procedure Laterality Date  . CATARACT EXTRACTION, BILATERAL    . HIP SURGERY  2012  . JOINT REPLACEMENT    . REPLACEMENT TOTAL KNEE Right    2001,2006,2009,2013  . REPLACEMENT TOTAL KNEE Left 2011    Social History   Tobacco Use  . Smoking status: Former Smoker    Types: Cigarettes    Last attempt to quit: 1971    Years since quitting: 48.0  . Smokeless tobacco: Never Used  Substance Use Topics  . Alcohol use: Yes    Alcohol/week: 1.8 oz    Types: 3 Cans of beer per week    Comment: max 3 beers/week  . Drug use: No    Comment: former user of marijuana, quit in 1971    Family History  Problem Relation Age of Onset  . Cancer Mother        throat  . Hypertension Mother   . Heart disease Mother   . Emphysema Mother   . Hypertension Father   . Cancer Father        Brain  . Hypertension Sister   . Hematuria Sister   . Hematuria Brother     Allergies  Allergen Reactions  . Dilaudid [Hydromorphone Hcl]     Medication list has been reviewed and updated.  Current Outpatient Medications on File Prior to Visit   Medication Sig Dispense Refill  . acetaminophen (TYLENOL) 500 MG tablet Take 500 mg by mouth every 6 (six) hours as needed.    Marland Kitchen buPROPion (WELLBUTRIN SR) 200 MG 12 hr tablet TAKE 1 TABLET TWICE A DAY 180 tablet 2  . losartan (COZAAR) 100 MG tablet Take 1 tablet (100 mg total) by mouth daily. Fill at patient request (Patient taking differently: Take 100 mg by mouth 2 times daily at 12 noon and 4 pm. Fill at patient request) 90 tablet 0   No current facility-administered medications on file prior to visit.     ROS ROS otherwise unremarkable unless listed above.  Physical Examination: BP 128/83   Pulse 70   Temp 98.1 F (36.7 C) (Oral)   Resp 16   Ht 5\' 11"  (1.803 m)   Wt 210 lb (95.3 kg)   SpO2 96%   BMI 29.29 kg/m  Ideal Body Weight: Weight in (lb) to have BMI = 25: 178.9  Physical Exam  Constitutional: He is oriented to person, place, and time. He appears  well-developed and well-nourished. No distress.  HENT:  Head: Atraumatic.  Right Ear: Tympanic membrane, external ear and ear canal normal.  Left Ear: Tympanic membrane, external ear and ear canal normal.  Nose: Mucosal edema present. No rhinorrhea. Right sinus exhibits no maxillary sinus tenderness and no frontal sinus tenderness. Left sinus exhibits no maxillary sinus tenderness and no frontal sinus tenderness.  Mouth/Throat: No uvula swelling. No oropharyngeal exudate, posterior oropharyngeal edema or posterior oropharyngeal erythema.  Eyes: Conjunctivae, EOM and lids are normal. Pupils are equal, round, and reactive to light. Right eye exhibits normal extraocular motion. Left eye exhibits normal extraocular motion.  Neck: Trachea normal and full passive range of motion without pain. No edema and no erythema present.  Cardiovascular: Normal rate.  Pulmonary/Chest: Effort normal. No respiratory distress. He has no decreased breath sounds. He has no wheezes. He has no rhonchi.  Neurological: He is alert and oriented to  person, place, and time.  Skin: Skin is warm and dry. He is not diaphoretic.  Psychiatric: He has a normal mood and affect. His behavior is normal.     Assessment and Plan: Donya Tomaro is a 71 y.o. male who is here today  There are no diagnoses linked to this encounter.  Ivar Drape, PA-C [facility] 09/26/2017 2:00 PM

## 2017-09-26 NOTE — Patient Instructions (Addendum)
Please make sure you are hydrating well with 64 oz of water per day. You can use mucinex to get rid of the mucus build up.  Sinusitis, Adult Sinusitis is soreness and inflammation of your sinuses. Sinuses are hollow spaces in the bones around your face. They are located:  Around your eyes.  In the middle of your forehead.  Behind your nose.  In your cheekbones.  Your sinuses and nasal passages are lined with a stringy fluid (mucus). Mucus normally drains out of your sinuses. When your nasal tissues get inflamed or swollen, the mucus can get trapped or blocked so air cannot flow through your sinuses. This lets bacteria, viruses, and funguses grow, and that leads to infection. Follow these instructions at home: Medicines  Take, use, or apply over-the-counter and prescription medicines only as told by your doctor. These may include nasal sprays.  If you were prescribed an antibiotic medicine, take it as told by your doctor. Do not stop taking the antibiotic even if you start to feel better. Hydrate and Humidify  Drink enough water to keep your pee (urine) clear or pale yellow.  Use a cool mist humidifier to keep the humidity level in your home above 50%.  Breathe in steam for 10-15 minutes, 3-4 times a day or as told by your doctor. You can do this in the bathroom while a hot shower is running.  Try not to spend time in cool or dry air. Rest  Rest as much as possible.  Sleep with your head raised (elevated).  Make sure to get enough sleep each night. General instructions  Put a warm, moist washcloth on your face 3-4 times a day or as told by your doctor. This will help with discomfort.  Wash your hands often with soap and water. If there is no soap and water, use hand sanitizer.  Do not smoke. Avoid being around people who are smoking (secondhand smoke).  Keep all follow-up visits as told by your doctor. This is important. Contact a doctor if:  You have a fever.  Your  symptoms get worse.  Your symptoms do not get better within 10 days. Get help right away if:  You have a very bad headache.  You cannot stop throwing up (vomiting).  You have pain or swelling around your face or eyes.  You have trouble seeing.  You feel confused.  Your neck is stiff.  You have trouble breathing. This information is not intended to replace advice given to you by your health care provider. Make sure you discuss any questions you have with your health care provider. Document Released: 03/01/2008 Document Revised: 05/09/2016 Document Reviewed: 07/09/2015 Elsevier Interactive Patient Education  2018 [employer].    IF you received an x-ray today, you will receive an invoice from [facility]. Please contact [facility] Radiology at [phone] with questions or concerns regarding your invoice.   IF you received labwork today, you will receive an invoice from [place]. Please contact [facility] at [phone] with questions or concerns regarding your invoice.   Our billing staff will not be able to assist you with questions regarding bills from these companies.  You will be contacted with the lab results as soon as they are available. The fastest way to get your results is to activate your My Chart account. Instructions are located on the last page of this paperwork. If you have not heard from [name] regarding the results in 2 weeks, please contact this office.

## 2017-10-02 ENCOUNTER — Encounter: Payer: Self-pay | Admitting: Physician Assistant

## 2017-11-01 ENCOUNTER — Telehealth: Payer: Self-pay | Admitting: Family Medicine

## 2017-11-01 NOTE — Telephone Encounter (Signed)
Please advise needs different medication

## 2017-11-01 NOTE — Telephone Encounter (Signed)
Copied from [place] [id number]. Topic: Inquiry >> Nov 01, 2017 11:16 AM Conception Chancy, NT wrote: Patient received a letter in the mail from [place] about Losartan being on recall. He is needing something else called in.

## 2017-11-02 MED ORDER — OLMESARTAN MEDOXOMIL 40 MG PO TABS: 40.0000 mg | ORAL_TABLET | Freq: Every day | ORAL | 0 refills | Status: DC

## 2017-11-02 NOTE — Telephone Encounter (Signed)
Pt. Given Dr. Vonna Kotyk message in regard to [place]. Verbalizes understanding.

## 2017-11-02 NOTE — Telephone Encounter (Signed)
Stop losartan once Benicar 40 mg arrives. Take that once per day, monitor blood pressures to make sure it is not running too high or too low on that new medication. 3 months sent in as should be off recall by that time. If not can send in refills.

## 2017-11-03 DIAGNOSIS — M25561 Pain in right knee: Secondary | ICD-10-CM | POA: Diagnosis not present

## 2017-11-10 ENCOUNTER — Other Ambulatory Visit (HOSPITAL_COMMUNITY): Payer: Self-pay | Admitting: Orthopedic Surgery

## 2017-11-10 DIAGNOSIS — Z96651 Presence of right artificial knee joint: Principal | ICD-10-CM

## 2017-11-10 DIAGNOSIS — M25561 Pain in right knee: Principal | ICD-10-CM

## 2017-11-10 DIAGNOSIS — G8929 Other chronic pain: Secondary | ICD-10-CM

## 2017-11-18 DIAGNOSIS — H43811 Vitreous degeneration, right eye: Secondary | ICD-10-CM | POA: Diagnosis not present

## 2017-11-18 DIAGNOSIS — H35033 Hypertensive retinopathy, bilateral: Secondary | ICD-10-CM | POA: Diagnosis not present

## 2017-11-18 DIAGNOSIS — H353111 Nonexudative age-related macular degeneration, right eye, early dry stage: Secondary | ICD-10-CM | POA: Diagnosis not present

## 2017-11-18 DIAGNOSIS — Z961 Presence of intraocular lens: Secondary | ICD-10-CM | POA: Diagnosis not present

## 2017-11-18 DIAGNOSIS — H1850 Unspecified hereditary corneal dystrophies: Secondary | ICD-10-CM | POA: Diagnosis not present

## 2017-11-22 ENCOUNTER — Encounter (HOSPITAL_COMMUNITY)
Admission: RE | Admit: 2017-11-22 | Discharge: 2017-11-22 | Disposition: A | Discharge status: Home / Self Care | Payer: Medicare Other | Source: Ambulatory Visit | Attending: Orthopedic Surgery | Admitting: Orthopedic Surgery

## 2017-11-22 DIAGNOSIS — Z96651 Presence of right artificial knee joint: Secondary | ICD-10-CM | POA: Diagnosis not present

## 2017-11-22 DIAGNOSIS — M25561 Pain in right knee: Secondary | ICD-10-CM | POA: Diagnosis not present

## 2017-11-22 DIAGNOSIS — G8929 Other chronic pain: Secondary | ICD-10-CM | POA: Insufficient documentation

## 2017-11-22 DIAGNOSIS — Z96641 Presence of right artificial hip joint: Secondary | ICD-10-CM | POA: Diagnosis not present

## 2017-11-22 DIAGNOSIS — Z471 Aftercare following joint replacement surgery: Secondary | ICD-10-CM | POA: Diagnosis not present

## 2017-11-22 DIAGNOSIS — Z96642 Presence of left artificial hip joint: Secondary | ICD-10-CM | POA: Diagnosis not present

## 2017-11-22 MED ORDER — TECHNETIUM TC 99M MEDRONATE IV KIT: 21.8000 | PACK | Freq: Once | INTRAVENOUS | Status: DC | PRN

## 2017-11-22 MED ORDER — TECHNETIUM TC 99M MEDRONATE IV KIT
25.0000 | PACK | Freq: Once | INTRAVENOUS | Status: AC | PRN
  Administered 2017-11-22: 25 via INTRAVENOUS

## 2017-11-30 DIAGNOSIS — Z96651 Presence of right artificial knee joint: Secondary | ICD-10-CM | POA: Diagnosis not present

## 2017-11-30 DIAGNOSIS — Z96652 Presence of left artificial knee joint: Secondary | ICD-10-CM | POA: Diagnosis not present

## 2017-11-30 DIAGNOSIS — M25561 Pain in right knee: Secondary | ICD-10-CM | POA: Diagnosis not present

## 2017-11-30 DIAGNOSIS — M25562 Pain in left knee: Secondary | ICD-10-CM | POA: Diagnosis not present

## 2017-12-12 DIAGNOSIS — H43391 Other vitreous opacities, right eye: Secondary | ICD-10-CM | POA: Diagnosis not present

## 2017-12-12 DIAGNOSIS — H43813 Vitreous degeneration, bilateral: Secondary | ICD-10-CM | POA: Diagnosis not present

## 2017-12-12 DIAGNOSIS — H35363 Drusen (degenerative) of macula, bilateral: Secondary | ICD-10-CM | POA: Diagnosis not present

## 2017-12-13 ENCOUNTER — Other Ambulatory Visit: Payer: Self-pay | Admitting: Physician Assistant

## 2017-12-14 NOTE — Telephone Encounter (Signed)
Losartan refill request  Saw note from last month that Benicar is being used in place of this due to recall.   Didn't know whether to approve this or not.  Dr. Kae Heller [address] [place].

## 2017-12-16 NOTE — Telephone Encounter (Signed)
Should be on Benicar. I will send Mychart message to clarify.

## 2017-12-16 NOTE — Telephone Encounter (Signed)
Clarified with pharmacy - current losartan has not been recalled.  Will change back to losartan -prescription sent to pharmacy.  I will also send a my chart message to patient with this information as I was unable to reach him on 2 provided numbers

## 2017-12-26 DIAGNOSIS — L570 Actinic keratosis: Secondary | ICD-10-CM | POA: Diagnosis not present

## 2017-12-26 DIAGNOSIS — L820 Inflamed seborrheic keratosis: Secondary | ICD-10-CM | POA: Diagnosis not present

## 2017-12-27 DIAGNOSIS — R319 Hematuria, unspecified: Secondary | ICD-10-CM | POA: Diagnosis not present

## 2017-12-27 DIAGNOSIS — N183 Chronic kidney disease, stage 3 (moderate): Secondary | ICD-10-CM | POA: Diagnosis not present

## 2017-12-27 DIAGNOSIS — E875 Hyperkalemia: Secondary | ICD-10-CM | POA: Diagnosis not present

## 2017-12-27 DIAGNOSIS — I129 Hypertensive chronic kidney disease with stage 1 through stage 4 chronic kidney disease, or unspecified chronic kidney disease: Secondary | ICD-10-CM | POA: Diagnosis not present

## 2017-12-29 ENCOUNTER — Other Ambulatory Visit: Payer: Self-pay | Admitting: Nephrology

## 2017-12-29 DIAGNOSIS — N183 Chronic kidney disease, stage 3 unspecified: Secondary | ICD-10-CM

## 2017-12-30 ENCOUNTER — Ambulatory Visit
Admission: RE | Admit: 2017-12-30 | Discharge: 2017-12-30 | Disposition: A | Discharge status: Home / Self Care | Payer: Medicare Other | Source: Ambulatory Visit | Attending: Nephrology | Admitting: Nephrology

## 2017-12-30 DIAGNOSIS — N189 Chronic kidney disease, unspecified: Secondary | ICD-10-CM | POA: Diagnosis not present

## 2017-12-30 DIAGNOSIS — N183 Chronic kidney disease, stage 3 unspecified: Secondary | ICD-10-CM

## 2018-01-04 ENCOUNTER — Encounter: Payer: Self-pay | Admitting: Physician Assistant

## 2018-01-05 DIAGNOSIS — D3001 Benign neoplasm of right kidney: Secondary | ICD-10-CM | POA: Diagnosis not present

## 2018-01-05 DIAGNOSIS — R3121 Asymptomatic microscopic hematuria: Secondary | ICD-10-CM | POA: Diagnosis not present

## 2018-01-10 DIAGNOSIS — L821 Other seborrheic keratosis: Secondary | ICD-10-CM | POA: Diagnosis not present

## 2018-01-24 ENCOUNTER — Encounter: Payer: Self-pay | Admitting: Family Medicine

## 2018-01-24 ENCOUNTER — Other Ambulatory Visit: Payer: Self-pay

## 2018-01-24 ENCOUNTER — Ambulatory Visit (INDEPENDENT_AMBULATORY_CARE_PROVIDER_SITE_OTHER): Payer: Medicare Other | Admitting: Family Medicine

## 2018-01-24 VITALS — BP 130/70 | HR 96 | Temp 98.3°F | Ht 70.47 in | Wt 214.4 lb

## 2018-01-24 DIAGNOSIS — I10 Essential (primary) hypertension: Secondary | ICD-10-CM | POA: Diagnosis not present

## 2018-01-24 DIAGNOSIS — M79642 Pain in left hand: Secondary | ICD-10-CM

## 2018-01-24 DIAGNOSIS — M79641 Pain in right hand: Secondary | ICD-10-CM

## 2018-01-24 DIAGNOSIS — N189 Chronic kidney disease, unspecified: Secondary | ICD-10-CM | POA: Diagnosis not present

## 2018-01-24 DIAGNOSIS — G4730 Sleep apnea, unspecified: Secondary | ICD-10-CM | POA: Diagnosis not present

## 2018-01-24 DIAGNOSIS — R400 Somnolence: Secondary | ICD-10-CM | POA: Diagnosis not present

## 2018-01-24 NOTE — Progress Notes (Signed)
Subjective:  By signing my name below, I, Joseph Maldonado, attest that this documentation has been prepared under the direction and in the presence of [name], MD. Electronically Signed: [name], [place]. 01/24/2018 , 2:17 PM .  Patient was seen in Room 2 .   Patient ID: Joseph Parrot., male    DOB: June 25, 1946, 72 y.o.   MRN: [id number] Chief Complaint  Patient presents with  . Sleep Apnea    wife is concern waking up at night not breathing.  . Arthritis    in hands in also looses feeling in both hands off and on. PSA Check?   HPI Joseph Stelly. is a 72 y.o. male Here for follow up. Patient was last seen in Nov. He has a history of depression, HTN, cataracts (followed by Dr. Bing Plume), and elevated creatinine with chronic kidney disease.   HTN Lab Results  Component Value Date   CREATININE 1.39 (H) 08/23/2017   BP Readings from Last 3 Encounters:  01/24/18 130/70  09/26/17 128/83  08/23/17 122/72   Continued him on same dose of losartan but referred to nephrologist, discussed avoiding NSAIDs and maintaining hydration. Creatinine stable form prior reading of 1.48.   He had an appointment of April 2nd with nephrology, Dr. Noel Journey. CKD thought due to NSAIDS and HTN. Creatinine has been range of 1.36 and 1.48 since 2016.   Depression Stable at prior visit. He's taking Wellbutrin 200mg  BID.   Pain in hands Patient states waking up with bilateral hands feeling stiff and swollen that worsened about 1-2 months ago. He was wondering if it was related to his Losartan. He notes having history of arthritis in knees, shoulders and hips. He has relief of his hands after soaking hands in warm water. He denies any skin color changes. He denies new musculoskeletal pain or chest pain. He denies any changes in lifestyle or new projects requiring more activity in hands. He's been taking a total of tylenol 2000mg  over the course of a typical day, and up to total tylenol 3000mg  in a  day when knees feel more sore. He hasn't tried taking glucosamine supplements.   Sleep issues Concern of him pausing in his breathing at night. Of note, he did meet with neurology in Nov 2018 and per that note, no symptoms of sleep apnea at that time.   Patient states his wife informed him that he pauses in his sleep and then gasps for breath. He snores every once in a while. He notes the pausing in his sleep doesn't occur often, and usually resolves with his wife telling him to roll over. He denies waking up with this pause and gasping. He does feel sleepy during the day, but notes probably due to duration of sleep, with sleeping too late and waking up early. He denies napping during the day.    Pulmonology referral  He requests pulmonology referral for lung nodule. He was last seen by Dr. Melvyn Novas on Jan 3rd, 2018.   PSA screening Last PSA in May 2016, 1.57. Seen [facility], with most recent office visit on April 11th. He had right renal angiomyolipoma with hematuria; had CT done in Sept 2017 and [name] on April 5th, showed 1cm right angiomyolipoma as well as bilateral simple cysts. Do not see recent PSA or prostate exam from urology.   Patient Active Problem List   Diagnosis Date Noted  . Solitary pulmonary nodule 10/03/2016  . Cough 09/29/2016   Past Medical History:  Diagnosis Date  .  Arthritis   . Chronic kidney disease   . Depression   . Hematuria   . Hypertension    Past Surgical History:  Procedure Laterality Date  . CATARACT EXTRACTION, BILATERAL    . HIP SURGERY  2012  . JOINT REPLACEMENT    . REPLACEMENT TOTAL KNEE Right    2001,2006,2009,2013  . REPLACEMENT TOTAL KNEE Left 2011   Allergies  Allergen Reactions  . Dilaudid [Hydromorphone Hcl]    Prior to Admission medications   Medication Sig Start Date End Date Taking? Authorizing Provider  acetaminophen (TYLENOL) 500 MG tablet Take 500 mg by mouth every 6 (six) hours as needed.    [provider]    buPROPion [facility] SR) 200 MG 12 hr tablet TAKE 1 TABLET TWICE A DAY 09/07/17   Wendie Agreste, MD  losartan (COZAAR) 100 MG tablet TAKE 1 TABLET BY MOUTH ONCE DAILY 12/16/17   Wendie Agreste, MD   Social History   Socioeconomic History  . Marital status: Married    Spouse name: Not on file  . Number of children: Not on file  . Years of education: Not on file  . Highest education level: Bachelor's degree (e.g., BA, AB, BS)  Occupational History  . Occupation: Retired  Scientific laboratory technician  . Financial resource strain: Not hard at all  . Food insecurity:    Worry: Never true    Inability: Never true  . Transportation needs:    Medical: No    Non-medical: No  Tobacco Use  . Smoking status: Former Smoker    Types: Cigarettes    Last attempt to quit: 1971    Years since quitting: 48.3  . Smokeless tobacco: Never Used  Substance and Sexual Activity  . Alcohol use: Yes    Alcohol/week: 1.8 oz    Types: 3 Cans of beer per week    Comment: max 3 beers/week  . Drug use: No    Comment: former user of marijuana, quit in 1971  . Sexual activity: Never  Lifestyle  . Physical activity:    Days per week: 0 days    Minutes per session: 0 min  . Stress: Not at all  Relationships  . Social connections:    Talks on phone: More than three times a week    Gets together: Once a week    Attends religious service: More than 4 times per year    Active member of club or organization: Yes    Attends meetings of clubs or organizations: More than 4 times per year    Relationship status: Married  . Intimate partner violence:    Fear of current or ex partner: No    Emotionally abused: No    Physically abused: No    Forced sexual activity: No  Other Topics Concern  . Not on file  Social History Narrative   Married   Education: College   Exercise: No   Drinks about 2 large coffees per day   Right handed   Review of Systems  Constitutional: Negative for fatigue and unexpected weight  change.  Eyes: Negative for visual disturbance.  Respiratory: Positive for apnea (sleep). Negative for cough, chest tightness and shortness of breath.   Cardiovascular: Negative for chest pain, palpitations and leg swelling.  Gastrointestinal: Negative for abdominal pain and Maldonado in stool.  Musculoskeletal: Positive for arthralgias and joint swelling.  Neurological: Negative for dizziness, light-headedness and headaches.       Objective:   Physical Exam  Constitutional: He is oriented to person, place, and time. He appears well-developed and well-nourished.  HENT:  Head: Normocephalic and atraumatic.  Eyes: Pupils are equal, round, and reactive to light. EOM are normal.  Neck: No JVD present. Carotid bruit is not present.  Cardiovascular: Normal rate, regular rhythm and normal heart sounds.  No murmur heard. Pulmonary/Chest: Effort normal and breath sounds normal. He has no rales.  Musculoskeletal: He exhibits no edema.  Hands: slight prominence over PIP diffusely over 3rd-4th bilaterally, and PIP and MCP of his thumbs; some limitation in flexion of left IP versus right IP of thumb; good ROM of other fingers, good strength throughout hands, negative tinel's at the carpal tunnel bilaterally  Neurological: He is alert and oriented to person, place, and time.  Skin: Skin is warm and dry.  Psychiatric: He has a normal mood and affect.  Vitals reviewed.   Vitals:   01/24/18 1337  BP: 130/70  Pulse: 96  Temp: 98.3 F (36.8 C)  TempSrc: Oral  SpO2: 98%  Weight: 214 lb 6.4 oz (97.3 kg)  Height: 5' 10.47" (1.79 m)       Assessment & Plan:   Joseph Stukey. is a 72 y.o. male Sleep-related breathing disorder - Plan: Ambulatory referral to Sleep Studies Daytime somnolence - Plan: Ambulatory referral to Sleep Studies  -Reported pauses in breathing as above with some daytime somnolence.  Will refer to sleep specialist to discuss further and consider OSA testing  Essential  hypertension Chronic kidney disease, unspecified CKD stage  -Maldonado pressure stable, has ongoing follow-up with nephrology.  No change in meds for now.  Bilateral hand pain  -Suspected osteoarthritis.  Okay to continue Tylenol, option of glucosamine/conjoint supplement for 4 to 6 weeks to see if that provides relief.  If no benefit, was stopped at that time.  Recheck next 4 weeks if not improving.  Advised to call pulmonology to schedule follow-up.  Number provided  No orders of the defined types were placed in this encounter.  Patient Instructions    Hand pain and stiffness in the morning may be related to arthritis. Continuing tylenol would be my initial recommendation, along with  Glucosamine/chondroitin supplement.  Recheck in next 4-6 weeks.   Return to the clinic or go to the nearest emergency room if any of your symptoms worsen or new symptoms occur.  Maldonado pressure looks ok today. No change in meds for now.   Please call Dr. Gustavus Bryant office to schedule follow up on nodule:   [facility] in [place], [place]  Address: [place] [address] [place], [place], [place] [address]  Phone: [phone]  Thanks for coming in today.  Recheck for physical in next few months.    Managing Your Hypertension Hypertension is commonly called high Maldonado pressure. This is when the force of your Maldonado pressing against the walls of your arteries is too strong. Arteries are Maldonado vessels that carry Maldonado from your heart throughout your body. Hypertension forces the heart to work harder to pump Maldonado, and may cause the arteries to become narrow or stiff. Having untreated or uncontrolled hypertension can cause heart attack, stroke, kidney disease, and other problems. What are Maldonado pressure readings? A Maldonado pressure reading consists of a higher number over a lower number. Ideally, your Maldonado pressure should be below 120/80. The first ("top") number is called the  systolic pressure. It is a measure of the pressure in your arteries as your heart beats.  The second ("bottom") number is called the diastolic pressure. It is a measure of the pressure in your arteries as the heart relaxes. What does my Maldonado pressure reading mean? Maldonado pressure is classified into four stages. Based on your Maldonado pressure reading, your health care provider may use the following stages to determine what type of treatment you need, if any. Systolic pressure and diastolic pressure are measured in a unit called mm Hg. Normal  Systolic pressure: below 102.  Diastolic pressure: below 80. Elevated  Systolic pressure: 725-366.  Diastolic pressure: below 80. Hypertension stage 1  Systolic pressure: 440-347.  Diastolic pressure: 42-59. Hypertension stage 2  Systolic pressure: 563 or above.  Diastolic pressure: 90 or above. What health risks are associated with hypertension? Managing your hypertension is an important responsibility. Uncontrolled hypertension can lead to:  A heart attack.  A stroke.  A weakened Maldonado vessel (aneurysm).  Heart failure.  Kidney damage.  Eye damage.  Metabolic syndrome.  Memory and concentration problems.  What changes can I make to manage my hypertension? Hypertension can be managed by making lifestyle changes and possibly by taking medicines. Your health care provider will help you make a plan to bring your Maldonado pressure within a normal range. Eating and drinking  Eat a diet that is high in fiber and potassium, and low in salt (sodium), added sugar, and fat. An example eating plan is called the DASH (Dietary Approaches to Stop Hypertension) diet. To eat this way: ? Eat plenty of fresh fruits and vegetables. Try to fill half of your plate at each meal with fruits and vegetables. ? Eat whole grains, such as whole wheat pasta, brown rice, or whole grain bread. Fill about one quarter of your plate with whole grains. ? Eat low-fat  diary products. ? Avoid fatty cuts of meat, processed or cured meats, and poultry with skin. Fill about one quarter of your plate with lean proteins such as fish, chicken without skin, beans, eggs, and tofu. ? Avoid premade and processed foods. These tend to be higher in sodium, added sugar, and fat.  Reduce your daily sodium intake. Most people with hypertension should eat less than 1,500 mg of sodium a day.  Limit alcohol intake to no more than 1 drink a day for nonpregnant women and 2 drinks a day for men. One drink equals 12 oz of beer, 5 oz of wine, or 1 oz of hard liquor. Lifestyle  Work with your health care provider to maintain a healthy body weight, or to lose weight. Ask what an ideal weight is for you.  Get at least 30 minutes of exercise that causes your heart to beat faster (aerobic exercise) most days of the week. Activities may include walking, swimming, or biking.  Include exercise to strengthen your muscles (resistance exercise), such as weight lifting, as part of your weekly exercise routine. Try to do these types of exercises for 30 minutes at least 3 days a week.  Do not use any products that contain nicotine or tobacco, such as cigarettes and e-cigarettes. If you need help quitting, ask your health care provider.  Control any long-term (chronic) conditions you have, such as high cholesterol or diabetes. Monitoring  Monitor your Maldonado pressure at home as told by your health care provider. Your personal target Maldonado pressure may vary depending on your medical conditions, your age, and other factors.  Have your Maldonado pressure checked regularly, as often as told by your health care provider. Working with your health  care provider  Review all the medicines you take with your health care provider because there may be side effects or interactions.  Talk with your health care provider about your diet, exercise habits, and other lifestyle factors that may be contributing to  hypertension.  Visit your health care provider regularly. Your health care provider can help you create and adjust your plan for managing hypertension. Will I need medicine to control my Maldonado pressure? Your health care provider may prescribe medicine if lifestyle changes are not enough to get your Maldonado pressure under control, and if:  Your systolic Maldonado pressure is 130 or higher.  Your diastolic Maldonado pressure is 80 or higher.  Take medicines only as told by your health care provider. Follow the directions carefully. Maldonado pressure medicines must be taken as prescribed. The medicine does not work as well when you skip doses. Skipping doses also puts you at risk for problems. Contact a health care provider if:  You think you are having a reaction to medicines you have taken.  You have repeated (recurrent) headaches.  You feel dizzy.  You have swelling in your ankles.  You have trouble with your vision. Get help right away if:  You develop a severe headache or confusion.  You have unusual weakness or numbness, or you feel faint.  You have severe pain in your chest or abdomen.  You vomit repeatedly.  You have trouble breathing. Summary  Hypertension is when the force of Maldonado pumping through your arteries is too strong. If this condition is not controlled, it may put you at risk for serious complications.  Your personal target Maldonado pressure may vary depending on your medical conditions, your age, and other factors. For most people, a normal Maldonado pressure is less than 120/80.  Hypertension is managed by lifestyle changes, medicines, or both. Lifestyle changes include weight loss, eating a healthy, low-sodium diet, exercising more, and limiting alcohol. This information is not intended to replace advice given to you by your health care provider. Make sure you discuss any questions you have with your health care provider. Document Released: 06/07/2012 Document Revised:  08/11/2016 Document Reviewed: 08/11/2016 Elsevier Interactive Patient Education  2018 [place] is a term that is commonly used to refer to joint pain or joint disease. There are more than 100 types of arthritis. What are the causes? The most common cause of this condition is wear and tear of a joint. Other causes include:  Gout.  Inflammation of a joint.  An infection of a joint.  Sprains and other injuries near the joint.  A drug reaction or allergic reaction.  In some cases, the cause may not be known. What are the signs or symptoms? The main symptom of this condition is pain in the joint with movement. Other symptoms include:  Redness, swelling, or stiffness at a joint.  Warmth coming from the joint.  Fever.  Overall feeling of illness.  How is this diagnosed? This condition may be diagnosed with a physical exam and tests, including:  Maldonado tests.  Urine tests.  Imaging tests, such as MRI, X-rays, or a CT scan.  Sometimes, fluid is removed from a joint for testing. How is this treated? Treatment for this condition may involve:  Treatment of the cause, if it is known.  Rest.  Raising (elevating) the joint.  Applying cold or hot packs to the joint.  Medicines to improve symptoms and reduce inflammation.  Injections of a steroid such as  cortisone into the joint to help reduce pain and inflammation.  Depending on the cause of your arthritis, you may need to make lifestyle changes to reduce stress on your joint. These changes may include exercising more and losing weight. Follow these instructions at home: Medicines  Take over-the-counter and prescription medicines only as told by your health care provider.  Do not take aspirin to relieve pain if gout is suspected. Activity  Rest your joint if told by your health care provider. Rest is important when your disease is active and your joint feels painful, swollen, or  stiff.  Avoid activities that make the pain worse. It is important to balance activity with rest.  Exercise your joint regularly with range-of-motion exercises as told by your health care provider. Try doing low-impact exercise, such as: ? Swimming. ? Water aerobics. ? Biking. ? Walking. Joint Care   If your joint is swollen, keep it elevated if told by your health care provider.  If your joint feels stiff in the morning, try taking a warm shower.  If directed, apply heat to the joint. If you have diabetes, do not apply heat without permission from your health care provider. ? Put a towel between the joint and the hot pack or heating pad. ? Leave the heat on the area for 20-30 minutes.  If directed, apply ice to the joint: ? Put ice in a plastic bag. ? Place a towel between your skin and the bag. ? Leave the ice on for 20 minutes, 2-3 times per day.  Keep all follow-up visits as told by your health care provider. This is important. Contact a health care provider if:  The pain gets worse.  You have a fever. Get help right away if:  You develop severe joint pain, swelling, or redness.  Many joints become painful and swollen.  You develop severe back pain.  You develop severe weakness in your leg.  You cannot control your bladder or bowels. This information is not intended to replace advice given to you by your health care provider. Make sure you discuss any questions you have with your health care provider. Document Released: 10/21/2004 Document Revised: 02/19/2016 Document Reviewed: 12/09/2014 Elsevier Interactive Patient Education  2018 [employer].   IF you received an x-ray today, you will receive an invoice from [facility]. Please contact [facility] at [phone] with questions or concerns regarding your invoice.   IF you received labwork today, you will receive an invoice from [facility]. Please contact [facility] at [phone] with questions  or concerns regarding your invoice.   Our billing staff will not be able to assist you with questions regarding bills from these companies.  You will be contacted with the lab results as soon as they are available. The fastest way to get your results is to activate your My Chart account. Instructions are located on the last page of this paperwork. If you have not heard from [name] regarding the results in 2 weeks, please contact this office.       I personally performed the services described in this documentation, which was scribed in my presence. The recorded information has been reviewed and considered for accuracy and completeness, addended by me as needed, and agree with information above.  Signed,   [name], MD [facility].  01/25/18 9:54 PM

## 2018-01-24 NOTE — Patient Instructions (Addendum)
Hand pain and stiffness in the morning may be related to arthritis. Continuing tylenol would be my initial recommendation, along with  Glucosamine/chondroitin supplement.  Recheck in next 4-6 weeks.   Return to the clinic or go to the nearest emergency room if any of your symptoms worsen or new symptoms occur.  Blood pressure looks ok today. No change in meds for now.   Please call Dr. Gustavus Bryant office to schedule follow up on nodule:   [facility] in [place], [place]  Address: [place] [address] [place], [place], [place] [address]  Phone: [phone]  Thanks for coming in today.  Recheck for physical in next few months.    Managing Your Hypertension Hypertension is commonly called high blood pressure. This is when the force of your blood pressing against the walls of your arteries is too strong. Arteries are blood vessels that carry blood from your heart throughout your body. Hypertension forces the heart to work harder to pump blood, and may cause the arteries to become narrow or stiff. Having untreated or uncontrolled hypertension can cause heart attack, stroke, kidney disease, and other problems. What are blood pressure readings? A blood pressure reading consists of a higher number over a lower number. Ideally, your blood pressure should be below 120/80. The first ("top") number is called the systolic pressure. It is a measure of the pressure in your arteries as your heart beats. The second ("bottom") number is called the diastolic pressure. It is a measure of the pressure in your arteries as the heart relaxes. What does my blood pressure reading mean? Blood pressure is classified into four stages. Based on your blood pressure reading, your health care provider may use the following stages to determine what type of treatment you need, if any. Systolic pressure and diastolic pressure are measured in a unit called mm Hg. Normal  Systolic pressure:  below 120.  Diastolic pressure: below 80. Elevated  Systolic pressure: 573-220.  Diastolic pressure: below 80. Hypertension stage 1  Systolic pressure: 254-270.  Diastolic pressure: 62-37. Hypertension stage 2  Systolic pressure: 628 or above.  Diastolic pressure: 90 or above. What health risks are associated with hypertension? Managing your hypertension is an important responsibility. Uncontrolled hypertension can lead to:  A heart attack.  A stroke.  A weakened blood vessel (aneurysm).  Heart failure.  Kidney damage.  Eye damage.  Metabolic syndrome.  Memory and concentration problems.  What changes can I make to manage my hypertension? Hypertension can be managed by making lifestyle changes and possibly by taking medicines. Your health care provider will help you make a plan to bring your blood pressure within a normal range. Eating and drinking  Eat a diet that is high in fiber and potassium, and low in salt (sodium), added sugar, and fat. An example eating plan is called the DASH (Dietary Approaches to Stop Hypertension) diet. To eat this way: ? Eat plenty of fresh fruits and vegetables. Try to fill half of your plate at each meal with fruits and vegetables. ? Eat whole grains, such as whole wheat pasta, brown rice, or whole grain bread. Fill about one quarter of your plate with whole grains. ? Eat low-fat diary products. ? Avoid fatty cuts of meat, processed or cured meats, and poultry with skin. Fill about one quarter of your plate with lean proteins such as fish, chicken without skin, beans, eggs, and tofu. ? Avoid premade and processed foods. These tend to be higher  in sodium, added sugar, and fat.  Reduce your daily sodium intake. Most people with hypertension should eat less than 1,500 mg of sodium a day.  Limit alcohol intake to no more than 1 drink a day for nonpregnant women and 2 drinks a day for men. One drink equals 12 oz of beer, 5 oz of wine, or 1  oz of hard liquor. Lifestyle  Work with your health care provider to maintain a healthy body weight, or to lose weight. Ask what an ideal weight is for you.  Get at least 30 minutes of exercise that causes your heart to beat faster (aerobic exercise) most days of the week. Activities may include walking, swimming, or biking.  Include exercise to strengthen your muscles (resistance exercise), such as weight lifting, as part of your weekly exercise routine. Try to do these types of exercises for 30 minutes at least 3 days a week.  Do not use any products that contain nicotine or tobacco, such as cigarettes and e-cigarettes. If you need help quitting, ask your health care provider.  Control any long-term (chronic) conditions you have, such as high cholesterol or diabetes. Monitoring  Monitor your blood pressure at home as told by your health care provider. Your personal target blood pressure may vary depending on your medical conditions, your age, and other factors.  Have your blood pressure checked regularly, as often as told by your health care provider. Working with your health care provider  Review all the medicines you take with your health care provider because there may be side effects or interactions.  Talk with your health care provider about your diet, exercise habits, and other lifestyle factors that may be contributing to hypertension.  Visit your health care provider regularly. Your health care provider can help you create and adjust your plan for managing hypertension. Will I need medicine to control my blood pressure? Your health care provider may prescribe medicine if lifestyle changes are not enough to get your blood pressure under control, and if:  Your systolic blood pressure is 130 or higher.  Your diastolic blood pressure is 80 or higher.  Take medicines only as told by your health care provider. Follow the directions carefully. Blood pressure medicines must be taken  as prescribed. The medicine does not work as well when you skip doses. Skipping doses also puts you at risk for problems. Contact a health care provider if:  You think you are having a reaction to medicines you have taken.  You have repeated (recurrent) headaches.  You feel dizzy.  You have swelling in your ankles.  You have trouble with your vision. Get help right away if:  You develop a severe headache or confusion.  You have unusual weakness or numbness, or you feel faint.  You have severe pain in your chest or abdomen.  You vomit repeatedly.  You have trouble breathing. Summary  Hypertension is when the force of blood pumping through your arteries is too strong. If this condition is not controlled, it may put you at risk for serious complications.  Your personal target blood pressure may vary depending on your medical conditions, your age, and other factors. For most people, a normal blood pressure is less than 120/80.  Hypertension is managed by lifestyle changes, medicines, or both. Lifestyle changes include weight loss, eating a healthy, low-sodium diet, exercising more, and limiting alcohol. This information is not intended to replace advice given to you by your health care provider. Make sure you discuss any  questions you have with your health care provider. Document Released: 06/07/2012 Document Revised: 08/11/2016 Document Reviewed: 08/11/2016 Elsevier Interactive Patient Education  2018 [place] is a term that is commonly used to refer to joint pain or joint disease. There are more than 100 types of arthritis. What are the causes? The most common cause of this condition is wear and tear of a joint. Other causes include:  Gout.  Inflammation of a joint.  An infection of a joint.  Sprains and other injuries near the joint.  A drug reaction or allergic reaction.  In some cases, the cause may not be known. What are the signs or  symptoms? The main symptom of this condition is pain in the joint with movement. Other symptoms include:  Redness, swelling, or stiffness at a joint.  Warmth coming from the joint.  Fever.  Overall feeling of illness.  How is this diagnosed? This condition may be diagnosed with a physical exam and tests, including:  Blood tests.  Urine tests.  Imaging tests, such as MRI, X-rays, or a CT scan.  Sometimes, fluid is removed from a joint for testing. How is this treated? Treatment for this condition may involve:  Treatment of the cause, if it is known.  Rest.  Raising (elevating) the joint.  Applying cold or hot packs to the joint.  Medicines to improve symptoms and reduce inflammation.  Injections of a steroid such as cortisone into the joint to help reduce pain and inflammation.  Depending on the cause of your arthritis, you may need to make lifestyle changes to reduce stress on your joint. These changes may include exercising more and losing weight. Follow these instructions at home: Medicines  Take over-the-counter and prescription medicines only as told by your health care provider.  Do not take aspirin to relieve pain if gout is suspected. Activity  Rest your joint if told by your health care provider. Rest is important when your disease is active and your joint feels painful, swollen, or stiff.  Avoid activities that make the pain worse. It is important to balance activity with rest.  Exercise your joint regularly with range-of-motion exercises as told by your health care provider. Try doing low-impact exercise, such as: ? Swimming. ? Water aerobics. ? Biking. ? Walking. Joint Care   If your joint is swollen, keep it elevated if told by your health care provider.  If your joint feels stiff in the morning, try taking a warm shower.  If directed, apply heat to the joint. If you have diabetes, do not apply heat without permission from your health care  provider. ? Put a towel between the joint and the hot pack or heating pad. ? Leave the heat on the area for 20-30 minutes.  If directed, apply ice to the joint: ? Put ice in a plastic bag. ? Place a towel between your skin and the bag. ? Leave the ice on for 20 minutes, 2-3 times per day.  Keep all follow-up visits as told by your health care provider. This is important. Contact a health care provider if:  The pain gets worse.  You have a fever. Get help right away if:  You develop severe joint pain, swelling, or redness.  Many joints become painful and swollen.  You develop severe back pain.  You develop severe weakness in your leg.  You cannot control your bladder or bowels. This information is not intended to replace advice given to you by your  health care provider. Make sure you discuss any questions you have with your health care provider. Document Released: 10/21/2004 Document Revised: 02/19/2016 Document Reviewed: 12/09/2014 Elsevier Interactive Patient Education  2018 [employer].   IF you received an x-ray today, you will receive an invoice from [facility]. Please contact [facility] at [phone] with questions or concerns regarding your invoice.   IF you received labwork today, you will receive an invoice from [place]. Please contact [facility] at [phone] with questions or concerns regarding your invoice.   Our billing staff will not be able to assist you with questions regarding bills from these companies.  You will be contacted with the lab results as soon as they are available. The fastest way to get your results is to activate your My Chart account. Instructions are located on the last page of this paperwork. If you have not heard from [name] regarding the results in 2 weeks, please contact this office.

## 2018-01-28 ENCOUNTER — Ambulatory Visit (INDEPENDENT_AMBULATORY_CARE_PROVIDER_SITE_OTHER): Payer: Medicare Other | Admitting: Physician Assistant

## 2018-01-28 ENCOUNTER — Encounter: Payer: Self-pay | Admitting: Physician Assistant

## 2018-01-28 ENCOUNTER — Other Ambulatory Visit: Payer: Self-pay

## 2018-01-28 VITALS — BP 126/78 | HR 92 | Temp 97.9°F | Ht 70.47 in | Wt 213.4 lb

## 2018-01-28 DIAGNOSIS — N189 Chronic kidney disease, unspecified: Secondary | ICD-10-CM

## 2018-01-28 DIAGNOSIS — Z125 Encounter for screening for malignant neoplasm of prostate: Secondary | ICD-10-CM

## 2018-01-28 DIAGNOSIS — C4492 Squamous cell carcinoma of skin, unspecified: Secondary | ICD-10-CM | POA: Insufficient documentation

## 2018-01-28 DIAGNOSIS — R002 Palpitations: Secondary | ICD-10-CM

## 2018-01-28 DIAGNOSIS — Z13220 Encounter for screening for lipoid disorders: Secondary | ICD-10-CM | POA: Diagnosis not present

## 2018-01-28 NOTE — Patient Instructions (Addendum)
Stay well hydrated, avoid caffeine and get plenty of rest.    IF you received an x-ray today, you will receive an invoice from [facility]. Please contact [facility] at [phone] with questions or concerns regarding your invoice.   IF you received labwork today, you will receive an invoice from [facility]. Please contact [facility] at [phone] with questions or concerns regarding your invoice.   Our billing staff will not be able to assist you with questions regarding bills from these companies.  You will be contacted with the lab results as soon as they are available. The fastest way to get your results is to activate your My Chart account. Instructions are located on the last page of this paperwork. If you have not heard from [name] regarding the results in 2 weeks, please contact this office.

## 2018-01-28 NOTE — Progress Notes (Signed)
Patient ID: Joseph Maldonado., male    DOB: March 30, 1946, 72 y.o.   MRN: [id number]  PCP: Wendie Agreste, MD  Chief Complaint  Patient presents with  . abnormal heart rythym    Subjective:   Presents for evaluation of heart palpiataions. He is accompanied by his wife, a retired cardiac care nurse.  He awoke this morning about 5 am experiencing heart palpitations. No associated chest pain, shortness of breath, dizziness, nausea.  After about 90 minutes woke his wife who checked his pulse and described it as irregular.  She coached him on Valsalva maneuver and the palpitations stopped.  He has had no recurrence of palpitations throughout the day. Reports a history of PAT in the 1970's. Last episode was nearly 20 years ago.  Previous episodes were associated with stressful times in his life.  He has an evaluation with sleep medicine next month.  His wife has witnessed some apnea.  He was previously evaluated for same but not found to have symptoms and did not undergo sleep study.  Scheduled for wellness visit with his primary care provider next month as well.  Normal TSH 07/2017. Last lipids 07/2016, LDL was 98.     Review of Systems  Constitutional: Negative for chills and fever.  Respiratory: Negative for cough, choking, chest tightness, shortness of breath, wheezing and stridor.   Cardiovascular: Negative for chest pain, palpitations and leg swelling.  Gastrointestinal: Negative for diarrhea, nausea and vomiting.  Endocrine: Negative for polydipsia.  Genitourinary: Negative for dysuria, frequency and urgency.  Musculoskeletal: Negative for myalgias.  Skin: Negative for rash.  Neurological: Negative for dizziness and headaches.  Psychiatric/Behavioral: Negative for dysphoric mood. The patient is not nervous/anxious.        Patient Active Problem List   Diagnosis Date Noted  . SCC (squamous cell carcinoma) 01/28/2018  . Solitary pulmonary nodule 10/03/2016  . Cough  09/29/2016  . Seborrheic keratosis 02/26/2014  . Rotator cuff tear arthropathy of right shoulder 01/10/2014  . 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 S/P total knee arthroplasty 10/05/2011  . 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 OA (osteoarthritis) of knee 03/01/2011  . CKD (chronic kidney disease) 05/13/2010     Prior to Admission medications   Medication Sig Start Date End Date Taking? Authorizing Provider  acetaminophen (TYLENOL) 500 MG tablet Take 500 mg by mouth every 6 (six) hours as needed.   Yes [provider]  buPROPion (WELLBUTRIN SR) 200 MG 12 hr tablet TAKE 1 TABLET TWICE A DAY 09/07/17  Yes Wendie Agreste, MD  losartan (COZAAR) 100 MG tablet TAKE 1 TABLET BY MOUTH ONCE DAILY 12/16/17  Yes Wendie Agreste, MD     Allergies  Allergen Reactions  . Dilaudid [Hydromorphone Hcl]        Objective:  Physical Exam  Constitutional: He is oriented to person, place, and time. He appears well-developed and well-nourished. He is active and cooperative. No distress.  BP 126/78 (BP Location: Left Arm, Patient Position: Sitting, Cuff Size: Normal)   Pulse 92   Temp 97.9 F (36.6 C) (Oral)   Ht 5' 10.47" (1.79 m)   Wt 213 lb 6.4 oz (96.8 kg)   SpO2 98%   BMI 30.21 kg/m   HENT:  Head: Normocephalic and atraumatic.  Right Ear: Hearing normal.  Left Ear: Hearing normal.  Eyes: Conjunctivae are normal. No scleral icterus.  Neck: Normal range of motion. Neck supple. No thyromegaly present.  Cardiovascular: Normal rate, regular rhythm and normal heart sounds.  Pulses:      Radial pulses are 2+ on the right side, and 2+ on the left side.  Pulmonary/Chest: Effort normal and breath sounds normal.  Lymphadenopathy:       Head (right side): No tonsillar, no preauricular, no posterior auricular and no occipital adenopathy present.       Head (left side): No tonsillar, no preauricular, no posterior auricular and no occipital adenopathy present.      He has no cervical adenopathy.       Right: No supraclavicular adenopathy present.       Left: No supraclavicular adenopathy present.  Neurological: He is alert and oriented to person, place, and time. No sensory deficit.  Skin: Skin is warm, dry and intact. No rash noted. No cyanosis or erythema. Nails show no clubbing.  Psychiatric: He has a normal mood and affect. His speech is normal and behavior is normal.    EKG reviewed with the patient's primary care provider, Dr. [name].  Sinus rhythm with a rate of 74 bpm.  PR is 160.  QT 382.  There is no previous tracing in the record for comparison.       Assessment & Plan:   Problem List Items Addressed This Visit    Palpitations - Primary    Update labs today.  Refer to cardiology.  If palpitations recur and he is unable to resolve them with Valsalva, advise evaluation in the emergency department.      Relevant Orders   EKG 12-Lead (Completed)   Ambulatory referral to Cardiology   TSH   T4, free   CBC with Differential/Platelet   Comprehensive metabolic panel    Other Visit Diagnoses    Screening for hyperlipidemia       Collect fasting labs, along with those for further assessment of palpitations, to reduce need for additional blood draw.   Relevant Orders   Lipid panel   Screening for prostate cancer       Collect fasting labs, along with those for further assessment of palpitations, to reduce need for additional blood draw.   Relevant Orders   PSA       Follow-up: proceed with wellness visit and sleep evaluation as previously scheduled.   Fara Chute, PA-C [facility] [place]

## 2018-01-28 NOTE — Progress Notes (Signed)
Subjective:    Patient ID: Joseph Maldonado., male    DOB: 03-12-1946, 72 y.o.   MRN: [id number] Chief Complaint  Patient presents with  . abnormal heart rythym    HPI  72 yo male presents from home with his wife who is a Marine scientist. History of paroxysmal atrial tachycardia with block.   He reports having an episode of tachycardia/palpitations 5 am today. He was in this state until 6:30AM. Resolved after bearing down intermittently for 1 minute. Self-converted. Wife reports his rate was 150 range, felt irregular.  Denies CP, SOB, sweating, CP associated with deep breathing, denies jaw pain, nausea/vomiting. Denies any excessive exertion in the past few days.  Wife is concerned with his waking up in the middle of the night gasping. PCP has referred to neurology for OSA consult, will be seeing them June 19th.   Review of Systems  Patient Active Problem List   Diagnosis Date Noted  . Solitary pulmonary nodule 10/03/2016  . Cough 09/29/2016    Past Medical History:  Diagnosis Date  . Arthritis   . Chronic kidney disease   . Depression   . Hematuria   . Hypertension     Prior to Admission medications   Medication Sig Start Date End Date Taking? Authorizing Provider  acetaminophen (TYLENOL) 500 MG tablet Take 500 mg by mouth every 6 (six) hours as needed.   Yes [provider]  buPROPion (WELLBUTRIN SR) 200 MG 12 hr tablet TAKE 1 TABLET TWICE A DAY 09/07/17  Yes Wendie Agreste, MD  losartan (COZAAR) 100 MG tablet TAKE 1 TABLET BY MOUTH ONCE DAILY 12/16/17  Yes Wendie Agreste, MD    Allergies  Allergen Reactions  . Dilaudid [Hydromorphone Hcl]       Objective:   Physical Exam  Constitutional: He is oriented to person, place, and time. He appears well-developed and well-nourished. No distress.  BP 126/78 (BP Location: Left Arm, Patient Position: Sitting, Cuff Size: Normal)   Pulse 92   Temp 97.9 F (36.6 C) (Oral)   Ht 5' 10.47" (1.79 m)   Wt 213 lb 6.4  oz (96.8 kg)   SpO2 98%   BMI 30.21 kg/m    HENT:  Head: Normocephalic and atraumatic.  Eyes: Conjunctivae are normal. Right eye exhibits no discharge. Left eye exhibits no discharge.  Neck: Normal range of motion. Neck supple. No tracheal deviation present. No thyromegaly present.  Cardiovascular: Normal rate, regular rhythm, normal heart sounds and intact distal pulses. Exam reveals no gallop and no friction rub.  No murmur heard. Pulmonary/Chest: Effort normal and breath sounds normal. No stridor. No respiratory distress. He has no wheezes. He has no rales. He exhibits no tenderness.  Musculoskeletal: He exhibits no edema.  Lymphadenopathy:    He has no cervical adenopathy.  Neurological: He is alert and oriented to person, place, and time.  Skin: Skin is warm and dry. He is not diaphoretic. No erythema.  Psychiatric: He has a normal mood and affect. His behavior is normal.        Assessment & Plan:  1. Palpitations EKG normal sinus rhythm, no concerns as of now. Stay hydrated, avoid caffeine. - EKG 12-Lead - Ambulatory referral to Cardiology - TSH - T4, free - CBC with Differential/Platelet - Comprehensive metabolic panel  2. Screening for hyperlipidemia Await labs and f/u - Lipid panel  3. Screening for prostate cancer Await labs and f/u - PSA   Labs ordered prior to annual exam June 11th.  No follow-ups on file.

## 2018-01-29 ENCOUNTER — Encounter: Payer: Self-pay | Admitting: Physician Assistant

## 2018-01-29 DIAGNOSIS — R002 Palpitations: Secondary | ICD-10-CM | POA: Insufficient documentation

## 2018-01-29 NOTE — Assessment & Plan Note (Signed)
Update labs today.  Refer to cardiology.  If palpitations recur and he is unable to resolve them with Valsalva, advise evaluation in the emergency department.

## 2018-01-30 ENCOUNTER — Ambulatory Visit (INDEPENDENT_AMBULATORY_CARE_PROVIDER_SITE_OTHER): Payer: Medicare Other

## 2018-01-30 DIAGNOSIS — Z125 Encounter for screening for malignant neoplasm of prostate: Secondary | ICD-10-CM | POA: Diagnosis not present

## 2018-01-30 DIAGNOSIS — N189 Chronic kidney disease, unspecified: Secondary | ICD-10-CM | POA: Diagnosis not present

## 2018-01-30 DIAGNOSIS — R002 Palpitations: Secondary | ICD-10-CM | POA: Diagnosis not present

## 2018-01-30 DIAGNOSIS — Z13220 Encounter for screening for lipoid disorders: Secondary | ICD-10-CM | POA: Diagnosis not present

## 2018-01-30 DIAGNOSIS — I10 Essential (primary) hypertension: Secondary | ICD-10-CM

## 2018-01-31 LAB — COMPREHENSIVE METABOLIC PANEL
A/G RATIO: 2.2 (ref 1.2–2.2)
ALBUMIN: 4.2 g/dL (ref 3.5–4.8)
ALT: 12 IU/L (ref 0–44)
AST: 17 IU/L (ref 0–40)
Alkaline Phosphatase: 98 IU/L (ref 39–117)
BILIRUBIN TOTAL: 0.3 mg/dL (ref 0.0–1.2)
BUN/Creatinine Ratio: 21 (ref 10–24)
BUN: 31 mg/dL — ABNORMAL HIGH (ref 8–27)
CALCIUM: 9.5 mg/dL (ref 8.6–10.2)
CHLORIDE: 104 mmol/L (ref 96–106)
CO2: 20 mmol/L (ref 20–29)
Creatinine, Ser: 1.46 mg/dL — ABNORMAL HIGH (ref 0.76–1.27)
GFR calc Af Amer: 56 mL/min/{1.73_m2} — ABNORMAL LOW (ref >59)
GFR, EST NON AFRICAN AMERICAN: 48 mL/min/{1.73_m2} — AB (ref >59)
Globulin, Total: 1.9 g/dL (ref 1.5–4.5)
Glucose: 89 mg/dL (ref 65–99)
POTASSIUM: 4.5 mmol/L (ref 3.5–5.2)
Sodium: 139 mmol/L (ref 134–144)
TOTAL PROTEIN: 6.1 g/dL (ref 6.0–8.5)

## 2018-01-31 LAB — LIPID PANEL
CHOL/HDL RATIO: 3 ratio (ref 0.0–5.0)
Cholesterol, Total: 165 mg/dL (ref 100–199)
HDL: 55 mg/dL (ref >39)
LDL Calculated: 92 mg/dL (ref 0–99)
Triglycerides: 92 mg/dL (ref 0–149)
VLDL Cholesterol Cal: 18 mg/dL (ref 5–40)

## 2018-01-31 LAB — CBC WITH DIFFERENTIAL/PLATELET
BASOS: 1 %
Basophils Absolute: 0 10*3/uL (ref 0.0–0.2)
EOS (ABSOLUTE): 0.5 10*3/uL — AB (ref 0.0–0.4)
EOS: 7 %
Hematocrit: 44.2 % (ref 37.5–51.0)
Hemoglobin: 14.7 g/dL (ref 13.0–17.7)
IMMATURE GRANS (ABS): 0 10*3/uL (ref 0.0–0.1)
IMMATURE GRANULOCYTES: 0 %
LYMPHS: 24 %
Lymphocytes Absolute: 1.7 10*3/uL (ref 0.7–3.1)
MCH: 30.8 pg (ref 26.6–33.0)
MCHC: 33.3 g/dL (ref 31.5–35.7)
MCV: 93 fL (ref 79–97)
Monocytes Absolute: 0.6 10*3/uL (ref 0.1–0.9)
Monocytes: 8 %
NEUTROS ABS: 4.4 10*3/uL (ref 1.4–7.0)
NEUTROS PCT: 60 %
PLATELETS: 267 10*3/uL (ref 150–379)
RBC: 4.78 x10E6/uL (ref 4.14–5.80)
RDW: 14.1 % (ref 12.3–15.4)
WBC: 7.2 10*3/uL (ref 3.4–10.8)

## 2018-01-31 LAB — TSH: TSH: 3.32 u[IU]/mL (ref 0.450–4.500)

## 2018-01-31 LAB — PSA: Prostate Specific Ag, Serum: 2 ng/mL (ref 0.0–4.0)

## 2018-01-31 LAB — T4, FREE: FREE T4: 1.1 ng/dL (ref 0.82–1.77)

## 2018-02-07 ENCOUNTER — Telehealth: Payer: Self-pay | Admitting: Family Medicine

## 2018-02-07 NOTE — Telephone Encounter (Signed)
Advised pt via mychart that OV is needed.

## 2018-02-07 NOTE — Telephone Encounter (Signed)
Copied from [place] [id number]. Topic: Quick Communication - Rx Refill/Question >> Feb 07, 2018  9:04 AM Scherrie Gerlach wrote: Medication: benzonatate (TESSALON) 100 MG capsule  Pt states he has started back with a nervous cough again,. Would like Dr Nyoka Cowden to refill this for him, as pt states he did in the past  [facility] [place], [place] - [address] [phone] (Phone) [phone] (Fax)

## 2018-02-13 ENCOUNTER — Encounter: Payer: Self-pay | Admitting: Family Medicine

## 2018-02-13 ENCOUNTER — Ambulatory Visit (INDEPENDENT_AMBULATORY_CARE_PROVIDER_SITE_OTHER): Payer: Medicare Other | Admitting: Family Medicine

## 2018-02-13 VITALS — BP 140/76 | HR 75 | Temp 97.9°F | Ht 70.0 in | Wt 210.2 lb

## 2018-02-13 DIAGNOSIS — R059 Cough, unspecified: Secondary | ICD-10-CM

## 2018-02-13 DIAGNOSIS — R05 Cough: Secondary | ICD-10-CM

## 2018-02-13 DIAGNOSIS — R0981 Nasal congestion: Secondary | ICD-10-CM

## 2018-02-13 MED ORDER — BENZONATATE 100 MG PO CAPS: 100.0000 mg | ORAL_CAPSULE | Freq: Three times a day (TID) | ORAL | 0 refills | Status: DC | PRN

## 2018-02-13 MED ORDER — FLUTICASONE PROPIONATE 50 MCG/ACT NA SUSP: 2.0000 | Freq: Every day | NASAL | 6 refills | Status: DC

## 2018-02-13 NOTE — Patient Instructions (Addendum)
For cough, may be related to multiple causes of upper airway cough as discussed last year.   Try flonase nasal spray each day, claritin or allegra once per day and tessalon perles for now.  If not improved in next week to 10 days, start over the counter zantac.  If still not improving in the following week - please return for recheck.   Return to the clinic or go to the nearest emergency room if any of your symptoms worsen or new symptoms occur.   Cough, Adult Coughing is a reflex that clears your throat and your airways. Coughing helps to heal and protect your lungs. It is normal to cough occasionally, but a cough that happens with other symptoms or lasts a long time may be a sign of a condition that needs treatment. A cough may last only 2-3 weeks (acute), or it may last longer than 8 weeks (chronic). What are the causes? Coughing is commonly caused by:  Breathing in substances that irritate your lungs.  A viral or bacterial respiratory infection.  Allergies.  Asthma.  Postnasal drip.  Smoking.  Acid backing up from the stomach into the esophagus (gastroesophageal reflux).  Certain medicines.  Chronic lung problems, including COPD (or rarely, lung cancer).  Other medical conditions such as heart failure.  Follow these instructions at home: Pay attention to any changes in your symptoms. Take these actions to help with your discomfort:  Take medicines only as told by your health care provider. ? If you were prescribed an antibiotic medicine, take it as told by your health care provider. Do not stop taking the antibiotic even if you start to feel better. ? Talk with your health care provider before you take a cough suppressant medicine.  Drink enough fluid to keep your urine clear or pale yellow.  If the air is dry, use a cold steam vaporizer or humidifier in your bedroom or your home to help loosen secretions.  Avoid anything that causes you to cough at work or at  home.  If your cough is worse at night, try sleeping in a semi-upright position.  Avoid cigarette smoke. If you smoke, quit smoking. If you need help quitting, ask your health care provider.  Avoid caffeine.  Avoid alcohol.  Rest as needed.  Contact a health care provider if:  You have new symptoms.  You cough up pus.  Your cough does not get better after 2-3 weeks, or your cough gets worse.  You cannot control your cough with suppressant medicines and you are losing sleep.  You develop pain that is getting worse or pain that is not controlled with pain medicines.  You have a fever.  You have unexplained weight loss.  You have night sweats. Get help right away if:  You cough up blood.  You have difficulty breathing.  Your heartbeat is very fast. This information is not intended to replace advice given to you by your health care provider. Make sure you discuss any questions you have with your health care provider. Document Released: 03/12/2011 Document Revised: 02/19/2016 Document Reviewed: 11/20/2014 Elsevier Interactive Patient Education  2018 [employer].   IF you received an x-ray today, you will receive an invoice from [facility]. Please contact [facility] at [phone] with questions or concerns regarding your invoice.   IF you received labwork today, you will receive an invoice from [facility]. Please contact [facility] at [phone] with questions or concerns regarding your invoice.   Our billing staff will not be  able to assist you with questions regarding bills from these companies.  You will be contacted with the lab results as soon as they are available. The fastest way to get your results is to activate your My Chart account. Instructions are located on the last page of this paperwork. If you have not heard from [name] regarding the results in 2 weeks, please contact this office.

## 2018-02-13 NOTE — Progress Notes (Signed)
Subjective:  By signing my name below, I, Joseph Maldonado, attest that this documentation has been prepared under the direction and in the presence of Joseph Ray, MD. Electronically Signed: [name], [place]. 02/13/2018 , 4:05 PM .  Patient was seen in Room 10 .   Patient ID: Joseph Maldonado., male    DOB: September 21, 1946, 72 y.o.   MRN: [id number] Chief Complaint  Patient presents with  . dry cough    request for tessalon pearls   HPI Joseph Maldonado. is a 72 y.o. male  Patient presents with a mostly dry cough, sometimes productive, ongoing for about 3-4 weeks. He has seen pulmonologist, Dr. Melvyn Novas, for similar cough with upper airway cough syndrome in the past. He was prescribed tessalon pearls and Flonase nasal spray for relief after a couple of weeks. He was also prescribe heartburn medications, but he never took it. He reports this cough usually returns once every few years. He's taken OTC Delsym and robitussin without relief. He denies shortness of breath, heartburn or fever. He denies using nasal spray recently. He's taken wife's allergy nasal spray in the past, but not recently. He does have some nasal congestion.    Patient Active Problem List   Diagnosis Date Noted  . Palpitations 01/29/2018  . SCC (squamous cell carcinoma) 01/28/2018  . Solitary pulmonary nodule 10/03/2016  . Cough 09/29/2016  . Seborrheic keratosis 02/26/2014  . Rotator cuff tear arthropathy of right shoulder 01/10/2014  . 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 S/P total knee arthroplasty 10/05/2011  . 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 OA (osteoarthritis) of knee 03/01/2011  . CKD (chronic kidney disease) 05/13/2010   Past Medical History:  Diagnosis Date  . Arthritis   . Chronic kidney disease   . Depression   . Hematuria   . Hypertension    Past Surgical History:  Procedure Laterality Date  . CATARACT EXTRACTION, BILATERAL    . HIP SURGERY  2012  . JOINT  REPLACEMENT    . REPLACEMENT TOTAL KNEE Right    2001,2006,2009,2013  . REPLACEMENT TOTAL KNEE Left 2011   Allergies  Allergen Reactions  . Dilaudid [Hydromorphone Hcl]    Prior to Admission medications   Medication Sig Start Date End Date Taking? Authorizing Provider  acetaminophen (TYLENOL) 500 MG tablet Take 500 mg by mouth every 6 (six) hours as needed.    [provider]  buPROPion [facility] SR) 200 MG 12 hr tablet TAKE 1 TABLET TWICE A DAY 09/07/17   Wendie Agreste, MD  losartan (COZAAR) 100 MG tablet TAKE 1 TABLET BY MOUTH ONCE DAILY 12/16/17   Wendie Agreste, MD   Social History   Socioeconomic History  . Marital status: Married    Spouse name: Not on file  . Number of children: Not on file  . Years of education: Not on file  . Highest education level: Bachelor's degree (e.g., BA, AB, BS)  Occupational History  . Occupation: Retired  Scientific laboratory technician  . Financial resource strain: Not hard at all  . Food insecurity:    Worry: Never true    Inability: Never true  . Transportation needs:    Medical: No    Non-medical: No  Tobacco Use  . Smoking status: Former Smoker    Types: Cigarettes    Last attempt to quit: 1971    Years since quitting: 48.4  . Smokeless tobacco: Never Used  Substance and Sexual Activity  . Alcohol  use: Yes    Alcohol/week: 1.8 oz    Types: 3 Cans of beer per week    Comment: max 3 beers/week  . Drug use: No    Comment: former user of marijuana, quit in 1971  . Sexual activity: Yes  Lifestyle  . Physical activity:    Days per week: 0 days    Minutes per session: 0 min  . Stress: Not at all  Relationships  . Social connections:    Talks on phone: More than three times a week    Gets together: Once a week    Attends religious service: More than 4 times per year    Active member of club or organization: Yes    Attends meetings of clubs or organizations: More than 4 times per year    Relationship status: Married  . Intimate  partner violence:    Fear of current or ex partner: No    Emotionally abused: No    Physically abused: No    Forced sexual activity: No  Other Topics Concern  . Not on file  Social History Narrative   Lives with his wife.   Education: College   Exercise: No   Drinks about 2 large coffees per day   Right handed      Spent many years in [place].  Moved to [place] to be near their children.  Daughter lives in [place].  Their son has lived in the [place] area with plans to move to [place] in summer 2019.   Review of Systems  Constitutional: Negative for fatigue and unexpected weight change.  HENT: Positive for congestion.   Eyes: Negative for visual disturbance.  Respiratory: Positive for cough. Negative for chest tightness and shortness of breath.   Cardiovascular: Negative for chest pain, palpitations and leg swelling.  Gastrointestinal: Negative for abdominal pain and Maldonado in stool.  Neurological: Negative for dizziness, light-headedness and headaches.       Objective:   Physical Exam  Constitutional: He is oriented to person, place, and time. He appears well-developed and well-nourished.  HENT:  Head: Normocephalic and atraumatic.  Right Ear: Tympanic membrane, external ear and ear canal normal.  Left Ear: Tympanic membrane, external ear and ear canal normal.  Nose: No rhinorrhea.  Mouth/Throat: Oropharynx is clear and moist and mucous membranes are normal. No oropharyngeal exudate or posterior oropharyngeal erythema.  no discharge in the post oropharynx  Eyes: Pupils are equal, round, and reactive to light. Conjunctivae are normal.  Neck: Neck supple.  Cardiovascular: Normal rate, regular rhythm, normal heart sounds and intact distal pulses.  No murmur heard. Pulmonary/Chest: Effort normal and breath sounds normal. No stridor. No respiratory distress. He has no wheezes. He has no rhonchi. He has no rales.  Abdominal: Soft. There is no tenderness.  Lymphadenopathy:    He  has no cervical adenopathy.  Neurological: He is alert and oriented to person, place, and time.  Skin: Skin is warm and dry. No rash noted.  Psychiatric: He has a normal mood and affect. His behavior is normal.  Vitals reviewed.   Vitals:   02/13/18 1531  BP: 140/76  Pulse: 75  Temp: 97.9 F (36.6 C)  TempSrc: Oral  SpO2: 97%  Weight: 210 lb 3.2 oz (95.3 kg)  Height: 5\' 10"  (1.778 m)       Assessment & Plan:    Duncan Alejandro. is a 72 y.o. male Cough - Plan: benzonatate (TESSALON) 100 MG capsule  Sinus congestion - Plan:  fluticasone (FLONASE) 50 MCG/ACT nasal spray  Postnasal drip with allergies most likely cause of upper airway cough.  Reassuring exam, lungs clear, normal vital signs.  - Initial approach of Flonase nasal spray, over-the-counter less sedating antihistamine, Tessalon Perles if needed temporarily, then consider H2 blocker for possible laryngeal pharyngeal reflux if not improving in the next week to 10 days.   - RTC precautions if persistent symptoms next few weeks or any worsening.  Meds ordered this encounter  Medications  . benzonatate (TESSALON) 100 MG capsule    Sig: Take 1 capsule (100 mg total) by mouth 3 (three) times daily as needed for cough.    Dispense:  20 capsule    Refill:  0  . fluticasone (FLONASE) 50 MCG/ACT nasal spray    Sig: Place 2 sprays into both nostrils daily.    Dispense:  16 g    Refill:  6   Patient Instructions    For cough, may be related to multiple causes of upper airway cough as discussed last year.   Try flonase nasal spray each day, claritin or allegra once per day and tessalon perles for now.  If not improved in next week to 10 days, start over the counter zantac.  If still not improving in the following week - please return for recheck.   Return to the clinic or go to the nearest emergency room if any of your symptoms worsen or new symptoms occur.   Cough, Adult Coughing is a reflex that clears your throat and  your airways. Coughing helps to heal and protect your lungs. It is normal to cough occasionally, but a cough that happens with other symptoms or lasts a long time may be a sign of a condition that needs treatment. A cough may last only 2-3 weeks (acute), or it may last longer than 8 weeks (chronic). What are the causes? Coughing is commonly caused by:  Breathing in substances that irritate your lungs.  A viral or bacterial respiratory infection.  Allergies.  Asthma.  Postnasal drip.  Smoking.  Acid backing up from the stomach into the esophagus (gastroesophageal reflux).  Certain medicines.  Chronic lung problems, including COPD (or rarely, lung cancer).  Other medical conditions such as heart failure.  Follow these instructions at home: Pay attention to any changes in your symptoms. Take these actions to help with your discomfort:  Take medicines only as told by your health care provider. ? If you were prescribed an antibiotic medicine, take it as told by your health care provider. Do not stop taking the antibiotic even if you start to feel better. ? Talk with your health care provider before you take a cough suppressant medicine.  Drink enough fluid to keep your urine clear or pale yellow.  If the air is dry, use a cold steam vaporizer or humidifier in your bedroom or your home to help loosen secretions.  Avoid anything that causes you to cough at work or at home.  If your cough is worse at night, try sleeping in a semi-upright position.  Avoid cigarette smoke. If you smoke, quit smoking. If you need help quitting, ask your health care provider.  Avoid caffeine.  Avoid alcohol.  Rest as needed.  Contact a health care provider if:  You have new symptoms.  You cough up pus.  Your cough does not get better after 2-3 weeks, or your cough gets worse.  You cannot control your cough with suppressant medicines and you are losing sleep.  You develop pain that is  getting worse or pain that is not controlled with pain medicines.  You have a fever.  You have unexplained weight loss.  You have night sweats. Get help right away if:  You cough up Maldonado.  You have difficulty breathing.  Your heartbeat is very fast. This information is not intended to replace advice given to you by your health care provider. Make sure you discuss any questions you have with your health care provider. Document Released: 03/12/2011 Document Revised: 02/19/2016 Document Reviewed: 11/20/2014 Elsevier Interactive Patient Education  2018 [employer].   IF you received an x-Maldonado today, you will receive an invoice from [facility]. Please contact [facility] at [phone] with questions or concerns regarding your invoice.   IF you received labwork today, you will receive an invoice from [facility]. Please contact [facility] at [phone] with questions or concerns regarding your invoice.   Our billing staff will not be able to assist you with questions regarding bills from these companies.  You will be contacted with the lab results as soon as they are available. The fastest way to get your results is to activate your My Chart account. Instructions are located on the last page of this paperwork. If you have not heard from [name] regarding the results in 2 weeks, please contact this office.       I personally performed the services described in this documentation, which was scribed in my presence. The recorded information has been reviewed and considered for accuracy and completeness, addended by me as needed, and agree with information above.  Signed,   Joseph Ray, MD [facility].  02/13/18 4:38 PM

## 2018-02-17 DIAGNOSIS — I10 Essential (primary) hypertension: Secondary | ICD-10-CM | POA: Diagnosis not present

## 2018-02-17 DIAGNOSIS — R002 Palpitations: Secondary | ICD-10-CM | POA: Diagnosis not present

## 2018-02-17 DIAGNOSIS — R0683 Snoring: Secondary | ICD-10-CM | POA: Diagnosis not present

## 2018-02-21 ENCOUNTER — Other Ambulatory Visit: Payer: Self-pay | Admitting: Family Medicine

## 2018-02-21 DIAGNOSIS — R059 Cough, unspecified: Secondary | ICD-10-CM

## 2018-02-21 DIAGNOSIS — R05 Cough: Secondary | ICD-10-CM

## 2018-02-21 NOTE — Telephone Encounter (Signed)
Okay to refill? 

## 2018-02-22 MED ORDER — BENZONATATE 100 MG PO CAPS
100.0000 mg | ORAL_CAPSULE | Freq: Three times a day (TID) | ORAL | 0 refills | Status: DC | PRN
Start: 2018-02-22 — End: 2018-03-03

## 2018-02-22 NOTE — Telephone Encounter (Signed)
Update of symptoms noted.  Since improving, 1 refill was granted.

## 2018-03-01 ENCOUNTER — Telehealth: Payer: Self-pay | Admitting: Family Medicine

## 2018-03-01 NOTE — Telephone Encounter (Signed)
Copied from [facility]. Topic: Inquiry >> Mar 01, 2018  3:30 PM Margot Ables wrote: Reason for CRM: pt states that he is still having the cough. No other symptoms noted. Pt is asking what else he can do to relieve it. He has run out of tessalon pearls. They did help him. Please advise pt.  [facility] - [place], [place] AT [facility] [address] [phone] (Phone) [phone] (Fax)

## 2018-03-01 NOTE — Telephone Encounter (Signed)
Per patient's AVS at last visit, provider requests patient return if symptoms are not improving. Please call patient to schedule.

## 2018-03-03 ENCOUNTER — Ambulatory Visit (INDEPENDENT_AMBULATORY_CARE_PROVIDER_SITE_OTHER): Payer: Medicare Other

## 2018-03-03 ENCOUNTER — Encounter: Payer: Self-pay | Admitting: Family Medicine

## 2018-03-03 ENCOUNTER — Ambulatory Visit (INDEPENDENT_AMBULATORY_CARE_PROVIDER_SITE_OTHER): Payer: Medicare Other | Admitting: Family Medicine

## 2018-03-03 ENCOUNTER — Other Ambulatory Visit: Payer: Self-pay

## 2018-03-03 VITALS — BP 122/80 | HR 86 | Temp 98.4°F | Resp 16 | Ht 70.87 in | Wt 211.0 lb

## 2018-03-03 DIAGNOSIS — R058 Other specified cough: Secondary | ICD-10-CM

## 2018-03-03 DIAGNOSIS — R059 Cough, unspecified: Secondary | ICD-10-CM

## 2018-03-03 DIAGNOSIS — R05 Cough: Secondary | ICD-10-CM

## 2018-03-03 MED ORDER — BENZONATATE 100 MG PO CAPS: 100.0000 mg | ORAL_CAPSULE | Freq: Three times a day (TID) | ORAL | 0 refills | Status: DC | PRN

## 2018-03-03 MED ORDER — AZITHROMYCIN 250 MG PO TABS: ORAL_TABLET | ORAL | 0 refills | Status: DC

## 2018-03-03 MED ORDER — OMEPRAZOLE 20 MG PO CPDR: 20.0000 mg | DELAYED_RELEASE_CAPSULE | Freq: Every day | ORAL | 1 refills | Status: DC

## 2018-03-03 MED ORDER — RANITIDINE HCL 150 MG PO TABS: 150.0000 mg | ORAL_TABLET | Freq: Every day | ORAL | 1 refills | Status: DC

## 2018-03-03 NOTE — Progress Notes (Signed)
Subjective:  By signing my name below, I, Joseph Maldonado, attest that this documentation has been prepared under the direction and in the presence of [name], MD. Electronically Signed: [name], [place]. 03/03/2018 , 5:05 PM .  Patient was seen in Room 2 .   Patient ID: Joseph Maldonado., male    DOB: [birth year], 72 y.o.   MRN: [id number] Chief Complaint  Patient presents with  . Cough    pt states he has had this ptoductive cough x 3 weeks    HPI Joseph Maldonado. is a 72 y.o. male  Here for cough that started for the past 3 weeks, see previous visit. He has a history of prior cough and upper airway cough syndrome, reported recurrence of cough every few years. He had been seen by Dr. Melvyn Novas in the past for his cough, treated with tessalon, Flonase nasal spray, and medications for heartburn, but cough had resolved without taking heartburn medication. When I saw him on May 20th, his cough was present for 3-4 weeks without dyspnea, fever, or heartburn. He tried Delsym, and robitussin without relief He did admit to some nasal congestion, suspected postnasal drip with allergies with other upper airway cough syndrome. Xray was initially deferred. He was treated again with Flonase and Tessalon pearls if needed. Recommended antihistamine and considered H2 blocker, if not improved in 7-10 days. He isn't on an ACE inhibitor. Here for follow up.   Patient states he's been taking antihistamine (allegra) daily, and Flonase nasal spray since last visit. He was initially doing 1 puff a night, but now up to Flonase 2 puffs a night for the past 2 days, but hasn't noticed a change with his cough. He denies having relief with the allegra either. He has noticed temporary relief with tessalon pearls, but cough returns. He's been using Ricola's Lemon Mint cough drops about 6-7 pieces a day for the past 10 days. He denies heartburn sensation. He also tried taking 1 Tums a night after last visit here but  without any difference in his cough. He has a slight irritation in his throat, and has been coughing up clear phlegm for about 3-4 days now. He denies chest tightness. He denies orthopnea, or proximal nocturnal dyspnea. He denies fever or night sweats. He denies side effects with the antihistamine. He clears his throat with cough often throughout the day. He denies coughing at night, only during the day. He has a heart monitor in place.   He also mentions waking up with arthritic type pain, unable to make a closed fist due to pain. He has slight swelling in his index finger.   Patient Active Problem List   Diagnosis Date Noted  . Palpitations 01/29/2018  . SCC (squamous cell carcinoma) 01/28/2018  . Solitary pulmonary nodule 10/03/2016  . Cough 09/29/2016  . Seborrheic keratosis 02/26/2014  . Rotator cuff tear arthropathy of right shoulder 01/10/2014  . 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 S/P total knee arthroplasty 10/05/2011  . 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 OA (osteoarthritis) of knee 03/01/2011  . CKD (chronic kidney disease) 05/13/2010   Past Medical History:  Diagnosis Date  . Arthritis   . Chronic kidney disease   . Depression   . Hematuria   . Hypertension    Past Surgical History:  Procedure Laterality Date  . CATARACT EXTRACTION, BILATERAL    . HIP SURGERY  2012  . JOINT REPLACEMENT    . REPLACEMENT TOTAL  KNEE Right    2001,2006,2009,2013  . REPLACEMENT TOTAL KNEE Left 2011   Allergies  Allergen Reactions  . Dilaudid [Hydromorphone Hcl]    Prior to Admission medications   Medication Sig Start Date End Date Taking? Authorizing Provider  acetaminophen (TYLENOL) 500 MG tablet Take 500 mg by mouth every 6 (six) hours as needed.    [provider]  benzonatate (TESSALON) 100 MG capsule Take 1 capsule (100 mg total) by mouth 3 (three) times daily as needed for cough. 02/22/18   Wendie Agreste, MD  buPROPion [facility] SR)  200 MG 12 hr tablet TAKE 1 TABLET TWICE A DAY 09/07/17   Wendie Agreste, MD  fluticasone [facility]) 50 MCG/ACT nasal spray Place 2 sprays into both nostrils daily. 02/13/18   Wendie Agreste, MD  losartan (COZAAR) 100 MG tablet TAKE 1 TABLET BY MOUTH ONCE DAILY 12/16/17   Wendie Agreste, MD   Social History   Socioeconomic History  . Marital status: Married    Spouse name: Not on file  . Number of children: Not on file  . Years of education: Not on file  . Highest education level: Bachelor's degree (e.g., BA, AB, BS)  Occupational History  . Occupation: Retired  Scientific laboratory technician  . Financial resource strain: Not hard at all  . Food insecurity:    Worry: Never true    Inability: Never true  . Transportation needs:    Medical: No    Non-medical: No  Tobacco Use  . Smoking status: Former Smoker    Types: Cigarettes    Last attempt to quit: 1971    Years since quitting: 48.4  . Smokeless tobacco: Never Used  Substance and Sexual Activity  . Alcohol use: Yes    Alcohol/week: 1.8 oz    Types: 3 Cans of beer per week    Comment: max 3 beers/week  . Drug use: No    Comment: former user of marijuana, quit in 1971  . Sexual activity: Yes  Lifestyle  . Physical activity:    Days per week: 0 days    Minutes per session: 0 min  . Stress: Not at all  Relationships  . Social connections:    Talks on phone: More than three times a week    Gets together: Once a week    Attends religious service: More than 4 times per year    Active member of club or organization: Yes    Attends meetings of clubs or organizations: More than 4 times per year    Relationship status: Married  . Intimate partner violence:    Fear of current or ex partner: No    Emotionally abused: No    Physically abused: No    Forced sexual activity: No  Other Topics Concern  . Not on file  Social History Narrative   Lives with his wife.   Education: College   Exercise: No   Drinks about 2 large coffees per  day   Right handed      Spent many years in [place].  Moved to [place] to be near their children.  Daughter lives in [place].  Their son has lived in the [place] area with plans to move to [place] in summer 2019.   Review of Systems  Constitutional: Negative for activity change, chills, fatigue, fever and unexpected weight change.  HENT: Positive for congestion. Negative for postnasal drip, rhinorrhea, sinus pressure, sinus pain, sneezing, sore throat, trouble swallowing and voice change.  Eyes: Negative for visual disturbance.  Respiratory: Positive for cough. Negative for chest tightness, shortness of breath and wheezing.   Cardiovascular: Negative for chest pain, palpitations and leg swelling.  Gastrointestinal: Negative for abdominal pain and Maldonado in stool.  Neurological: Negative for dizziness, weakness, light-headedness, numbness and headaches.       Objective:   Physical Exam  Constitutional: He is oriented to person, place, and time. He appears well-developed and well-nourished.  HENT:  Head: Normocephalic and atraumatic.  Right Ear: Tympanic membrane, external ear and ear canal normal.  Left Ear: Tympanic membrane, external ear and ear canal normal.  Nose: Mucosal edema (slight) present. No rhinorrhea. Right sinus exhibits no maxillary sinus tenderness and no frontal sinus tenderness. Left sinus exhibits no maxillary sinus tenderness and no frontal sinus tenderness.  Mouth/Throat: Oropharynx is clear and moist and mucous membranes are normal. No oropharyngeal exudate or posterior oropharyngeal erythema.  Post oropharynx: no postnasal drip seen Nares: no discolored nasal discharge  Eyes: Pupils are equal, round, and reactive to light. Conjunctivae are normal.  Neck: Neck supple.  Cardiovascular: Normal rate, regular rhythm, normal heart sounds and intact distal pulses. Exam reveals no gallop and no friction rub.  No murmur heard. Pulmonary/Chest: Effort normal and breath  sounds normal. No respiratory distress. He has no wheezes. He has no rhonchi. He has no rales.  Abdominal: Soft. There is no tenderness.  Lymphadenopathy:    He has no cervical adenopathy.  Neurological: He is alert and oriented to person, place, and time.  Skin: Skin is warm and dry. No rash noted.  Psychiatric: He has a normal mood and affect. His behavior is normal.  Vitals reviewed.   Vitals:   03/03/18 1555  BP: 122/80  Pulse: 86  Resp: 16  Temp: 98.4 F (36.9 C)  TempSrc: Oral  SpO2: 98%  Weight: 211 lb (95.7 kg)  Height: 5' 10.87" (1.8 m)       Assessment & Plan:    Chun Sellen. is a 72 y.o. male Cough - Plan: DG Chest 2 View, omeprazole (PRILOSEC) 20 MG capsule, ranitidine (ZANTAC) 150 MG tablet, azithromycin (ZITHROMAX) 250 MG tablet  Upper airway cough syndrome - Plan: omeprazole (PRILOSEC) 20 MG capsule, ranitidine (ZANTAC) 150 MG tablet   -Persistent cough, still appears to be upper airway.  Minimal change with treatment of postnasal drip from allergies.  Still may be silent reflux.  Use of cough drops with mint may also be complicating matters.  Chest x-ray was reassuring.  No other systemic symptoms of concern.  -Continue Flonase, Allegra for now, change cough drops to ones without mint, try to avoid throat clearing.  Other foods to avoid were listed on paperwork after reviewing previous recommendations of pulmonologist  -Add omeprazole 20 mg every morning, Zantac 150 mg nightly.  -If productive cough not improving this week, azithromycin prescribed but currently does not appear to be infectious.  -Recheck next 2 weeks if not improving to determine next step. sooner if worse.   Meds ordered this encounter  Medications  . omeprazole (PRILOSEC) 20 MG capsule    Sig: Take 1 capsule (20 mg total) by mouth daily.    Dispense:  30 capsule    Refill:  1  . ranitidine (ZANTAC) 150 MG tablet    Sig: Take 1 tablet (150 mg total) by mouth at bedtime.    Dispense:   30 tablet    Refill:  1  . azithromycin (ZITHROMAX) 250 MG tablet  Sig: Take 2 pills by mouth on day 1, then 1 pill by mouth per day on days 2 through 5.    Dispense:  6 tablet    Refill:  0   Patient Instructions    As we discussed cough can still be due to a number of causes, but still appears to be from the upper airway.  I would continue the Flonase 2 sprays per nostril each day,  continue the antihistamine once per day.  In addition I would recommend starting omeprazole once every morning, and Zantac every night. Stop the current cough drops as those do contain mint.  I pulled some more information from your last pulmonary visit and found some other recommendations on things to avoid.  Avoid late meals, fatty foods, chocolate, peppermint, colas, red wine, and acidic juices such as orange juice.  Avoid mint or menthol products.  Ice chips or sugar-free candy if needed, but try to swallow to prevent all throat clearing.  Additionally recommended to avoid oil-based vitamins.  If cough remains productive or not improving this week, I printed an antibiotic, but do not see a need for that at this time.  Your xray was reassuring.   Recheck in next 2 weeks if cough is not improving. Return to the clinic or go to the nearest emergency room if any of your symptoms worsen or new symptoms occur.   Cough, Adult Coughing is a reflex that clears your throat and your airways. Coughing helps to heal and protect your lungs. It is normal to cough occasionally, but a cough that happens with other symptoms or lasts a long time may be a sign of a condition that needs treatment. A cough may last only 2-3 weeks (acute), or it may last longer than 8 weeks (chronic). What are the causes? Coughing is commonly caused by:  Breathing in substances that irritate your lungs.  A viral or bacterial respiratory infection.  Allergies.  Asthma.  Postnasal drip.  Smoking.  Acid backing up from the stomach into  the esophagus (gastroesophageal reflux).  Certain medicines.  Chronic lung problems, including COPD (or rarely, lung cancer).  Other medical conditions such as heart failure.  Follow these instructions at home: Pay attention to any changes in your symptoms. Take these actions to help with your discomfort:  Take medicines only as told by your health care provider. ? If you were prescribed an antibiotic medicine, take it as told by your health care provider. Do not stop taking the antibiotic even if you start to feel better. ? Talk with your health care provider before you take a cough suppressant medicine.  Drink enough fluid to keep your urine clear or pale yellow.  If the air is dry, use a cold steam vaporizer or humidifier in your bedroom or your home to help loosen secretions.  Avoid anything that causes you to cough at work or at home.  If your cough is worse at night, try sleeping in a semi-upright position.  Avoid cigarette smoke. If you smoke, quit smoking. If you need help quitting, ask your health care provider.  Avoid caffeine.  Avoid alcohol.  Rest as needed.  Contact a health care provider if:  You have new symptoms.  You cough up pus.  Your cough does not get better after 2-3 weeks, or your cough gets worse.  You cannot control your cough with suppressant medicines and you are losing sleep.  You develop pain that is getting worse or pain that is  not controlled with pain medicines.  You have a fever.  You have unexplained weight loss.  You have night sweats. Get help right away if:  You cough up Maldonado.  You have difficulty breathing.  Your heartbeat is very fast. This information is not intended to replace advice given to you by your health care provider. Make sure you discuss any questions you have with your health care provider. Document Released: 03/12/2011 Document Revised: 02/19/2016 Document Reviewed: 11/20/2014 Elsevier Interactive Patient  Education  2018 [employer].    IF you received an x-ray today, you will receive an invoice from [facility]. Please contact [facility] at [phone] with questions or concerns regarding your invoice.   IF you received labwork today, you will receive an invoice from [place]. Please contact [facility] at [phone] with questions or concerns regarding your invoice.   Our billing staff will not be able to assist you with questions regarding bills from these companies.  You will be contacted with the lab results as soon as they are available. The fastest way to get your results is to activate your My Chart account. Instructions are located on the last page of this paperwork. If you have not heard from [name] regarding the results in 2 weeks, please contact this office.       I personally performed the services described in this documentation, which was scribed in my presence. The recorded information has been reviewed and considered for accuracy and completeness, addended by me as needed, and agree with information above.  Signed,   [name], MD [facility].  03/03/18 5:38 PM

## 2018-03-03 NOTE — Patient Instructions (Addendum)
As we discussed cough can still be due to a number of causes, but still appears to be from the upper airway.  I would continue the Flonase 2 sprays per nostril each day,  continue the antihistamine once per day.  In addition I would recommend starting omeprazole once every morning, and Zantac every night. Stop the current cough drops as those do contain mint.  I pulled some more information from your last pulmonary visit and found some other recommendations on things to avoid.  Avoid late meals, fatty foods, chocolate, peppermint, colas, red wine, and acidic juices such as orange juice.  Avoid mint or menthol products.  Ice chips or sugar-free candy if needed, but try to swallow to prevent all throat clearing.  Additionally recommended to avoid oil-based vitamins.  If cough remains productive or not improving this week, I printed an antibiotic, but do not see a need for that at this time.  Your xray was reassuring.   Recheck in next 2 weeks if cough is not improving. Return to the clinic or go to the nearest emergency room if any of your symptoms worsen or new symptoms occur.   Cough, Adult Coughing is a reflex that clears your throat and your airways. Coughing helps to heal and protect your lungs. It is normal to cough occasionally, but a cough that happens with other symptoms or lasts a long time may be a sign of a condition that needs treatment. A cough may last only 2-3 weeks (acute), or it may last longer than 8 weeks (chronic). What are the causes? Coughing is commonly caused by:  Breathing in substances that irritate your lungs.  A viral or bacterial respiratory infection.  Allergies.  Asthma.  Postnasal drip.  Smoking.  Acid backing up from the stomach into the esophagus (gastroesophageal reflux).  Certain medicines.  Chronic lung problems, including COPD (or rarely, lung cancer).  Other medical conditions such as heart failure.  Follow these instructions at home: Pay  attention to any changes in your symptoms. Take these actions to help with your discomfort:  Take medicines only as told by your health care provider. ? If you were prescribed an antibiotic medicine, take it as told by your health care provider. Do not stop taking the antibiotic even if you start to feel better. ? Talk with your health care provider before you take a cough suppressant medicine.  Drink enough fluid to keep your urine clear or pale yellow.  If the air is dry, use a cold steam vaporizer or humidifier in your bedroom or your home to help loosen secretions.  Avoid anything that causes you to cough at work or at home.  If your cough is worse at night, try sleeping in a semi-upright position.  Avoid cigarette smoke. If you smoke, quit smoking. If you need help quitting, ask your health care provider.  Avoid caffeine.  Avoid alcohol.  Rest as needed.  Contact a health care provider if:  You have new symptoms.  You cough up pus.  Your cough does not get better after 2-3 weeks, or your cough gets worse.  You cannot control your cough with suppressant medicines and you are losing sleep.  You develop pain that is getting worse or pain that is not controlled with pain medicines.  You have a fever.  You have unexplained weight loss.  You have night sweats. Get help right away if:  You cough up blood.  You have difficulty breathing.  Your heartbeat is very  fast. This information is not intended to replace advice given to you by your health care provider. Make sure you discuss any questions you have with your health care provider. Document Released: 03/12/2011 Document Revised: 02/19/2016 Document Reviewed: 11/20/2014 Elsevier Interactive Patient Education  2018 [employer].    IF you received an x-ray today, you will receive an invoice from [facility]. Please contact [facility] at [phone] with questions or concerns regarding your  invoice.   IF you received labwork today, you will receive an invoice from [place]. Please contact [facility] at [phone] with questions or concerns regarding your invoice.   Our billing staff will not be able to assist you with questions regarding bills from these companies.  You will be contacted with the lab results as soon as they are available. The fastest way to get your results is to activate your My Chart account. Instructions are located on the last page of this paperwork. If you have not heard from [name] regarding the results in 2 weeks, please contact this office.

## 2018-03-03 NOTE — Addendum Note (Signed)
Addended by: [name] R on: 03/03/2018 05:51 PM   Modules accepted: Orders

## 2018-03-07 ENCOUNTER — Encounter: Payer: Medicare Other | Admitting: Family Medicine

## 2018-03-13 ENCOUNTER — Encounter: Payer: Self-pay | Admitting: Family Medicine

## 2018-03-13 ENCOUNTER — Ambulatory Visit (INDEPENDENT_AMBULATORY_CARE_PROVIDER_SITE_OTHER): Payer: Medicare Other | Admitting: Family Medicine

## 2018-03-13 ENCOUNTER — Other Ambulatory Visit: Payer: Self-pay

## 2018-03-13 ENCOUNTER — Ambulatory Visit (INDEPENDENT_AMBULATORY_CARE_PROVIDER_SITE_OTHER): Payer: Medicare Other

## 2018-03-13 VITALS — BP 146/84 | HR 94 | Temp 99.3°F | Ht 70.5 in | Wt 211.0 lb

## 2018-03-13 DIAGNOSIS — R197 Diarrhea, unspecified: Secondary | ICD-10-CM

## 2018-03-13 DIAGNOSIS — R509 Fever, unspecified: Secondary | ICD-10-CM | POA: Diagnosis not present

## 2018-03-13 DIAGNOSIS — R059 Cough, unspecified: Secondary | ICD-10-CM

## 2018-03-13 DIAGNOSIS — M62838 Other muscle spasm: Secondary | ICD-10-CM | POA: Diagnosis not present

## 2018-03-13 DIAGNOSIS — R6889 Other general symptoms and signs: Secondary | ICD-10-CM

## 2018-03-13 DIAGNOSIS — R05 Cough: Secondary | ICD-10-CM

## 2018-03-13 DIAGNOSIS — M542 Cervicalgia: Secondary | ICD-10-CM | POA: Diagnosis not present

## 2018-03-13 LAB — POCT CBC
Granulocyte percent: 89.6 %G — AB (ref 37–80)
HEMATOCRIT: 43.4 % — AB (ref 43.5–53.7)
HEMOGLOBIN: 14.3 g/dL (ref 14.1–18.1)
Lymph, poc: 0.7 (ref 0.6–3.4)
MCH: 29.7 pg (ref 27–31.2)
MCHC: 32.9 g/dL (ref 31.8–35.4)
MCV: 90.1 fL (ref 80–97)
MID (CBC): 0.3 (ref 0–0.9)
MPV: 7.2 fL (ref 0–99.8)
POC GRANULOCYTE: 8.7 — AB (ref 2–6.9)
POC LYMPH PERCENT: 7.1 %L — AB (ref 10–50)
POC MID %: 3.3 % (ref 0–12)
Platelet Count, POC: 286 10*3/uL (ref 142–424)
RBC: 4.81 M/uL (ref 4.69–6.13)
RDW, POC: 13.8 %
WBC: 9.7 10*3/uL (ref 4.6–10.2)

## 2018-03-13 LAB — POC INFLUENZA A&B (BINAX/QUICKVUE)
INFLUENZA A, POC: NEGATIVE
Influenza B, POC: NEGATIVE

## 2018-03-13 MED ORDER — AZITHROMYCIN 250 MG PO TABS: ORAL_TABLET | ORAL | 0 refills | Status: DC

## 2018-03-13 MED ORDER — HYDROCODONE-HOMATROPINE 5-1.5 MG/5ML PO SYRP: 5.0000 mL | ORAL_SOLUTION | Freq: Four times a day (QID) | ORAL | 0 refills | Status: DC | PRN

## 2018-03-13 MED ORDER — CYCLOBENZAPRINE HCL 5 MG PO TABS: ORAL_TABLET | ORAL | 0 refills | Status: DC

## 2018-03-13 NOTE — Patient Instructions (Addendum)
Hydrocodone cough syrup as needed, start antibiotic for chronic worsening cough.  From previous irritant cough I did refer you to pulmonary.  If any worsening of symptoms the next few days including higher fevers, difficulty with light or sound, or worsening headache, return for recheck here or at emergency room as we discussed.  For neck pain, that is likely due to coughing along with sleeping on different pillow.  The hydrocodone cough syrup may help, but I did write for a few muscle relaxants if needed.  Try not to combine those with other sedating medicines including the cough syrup.  Return to the clinic or go to the nearest emergency room if any of your symptoms worsen or new symptoms occur.    IF you received an x-ray today, you will receive an invoice from [facility]. Please contact [facility] at [phone] with questions or concerns regarding your invoice.   IF you received labwork today, you will receive an invoice from [place]. Please contact [facility] at [phone] with questions or concerns regarding your invoice.   Our billing staff will not be able to assist you with questions regarding bills from these companies.  You will be contacted with the lab results as soon as they are available. The fastest way to get your results is to activate your My Chart account. Instructions are located on the last page of this paperwork. If you have not heard from [name] regarding the results in 2 weeks, please contact this office.

## 2018-03-13 NOTE — Progress Notes (Signed)
Subjective:  By signing my name below, I, [name], attest that this documentation has been prepared under the direction and in the presence of [name], MD. Electronically Signed: [name], [employer]. 03/13/2018 , 4:37 PM .  Patient was seen in Room 10 .   Patient ID: Joseph Maldonado., male    DOB: 1945-11-09, 72 y.o.   MRN: [id number] Chief Complaint  Patient presents with  . Cough    6-8 weeks. wont go away  . Neck Pain    going on since sunday morning   HPI Joseph Maldonado. is a 72 y.o. male  Here for persistent cough, see prior visits.   Cough Most recent visit on June 7th at that time, symptoms reinforced upper airway cough syndrome. Recommended discontinuation of mint cough drops and advised to start omeprazole every morning and zantac every night. He had a reassuring chest xray but did prescribe his azithromycin if not improving in 1 week.   Patient states his cough has gotten worse, and now coughing up phlegm, more productive for about 4 days now. He notes phlegm with pink discoloration due to cherry cough drop. He did not start antibiotic. He feels warm subjectively, but hasn't measured a fever. He's been taking zantac, and omeprazole, as well as stopping the mint cough drops without any relief. He notes he didn't return home until yesterday night due to going up [place] for his wife's cousin funeral. Their niece was sick with vomiting the weekend. And then, patient's wife had gotten sick with upset stomach, body aches, chills and diarrhea. Patient had chills yesterday with diarrhea today.    Neck pain  Patient states he's noticed bilateral neck pain 2 days ago. He wasn't able to sleep last night including sleeping in his recliner. He informs sleeping on bad pillows while being out of town. He initially noticed neck pain while driving 2 days ago, but worse yesterday. When he's coughing harder, he felt some pain going down his arm. Yesterday, his neck pain was most  noticeable. He denies photophobia or phonophobia. He's had headaches on and off. He notes limited ROM of neck due to pain. He denies known sick contact of meningitis. He denies any foreign travel recently.   Patient Active Problem List   Diagnosis Date Noted  . Palpitations 01/29/2018  . SCC (squamous cell carcinoma) 01/28/2018  . Solitary pulmonary nodule 10/03/2016  . Cough 09/29/2016  . Seborrheic keratosis 02/26/2014  . Rotator cuff tear arthropathy of right shoulder 01/10/2014  . 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 S/P total knee arthroplasty 10/05/2011  . 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 OA (osteoarthritis) of knee 03/01/2011  . CKD (chronic kidney disease) 05/13/2010   Past Medical History:  Diagnosis Date  . Arthritis   . Chronic kidney disease   . Depression   . Hematuria   . Hypertension    Past Surgical History:  Procedure Laterality Date  . CATARACT EXTRACTION, BILATERAL    . HIP SURGERY  2012  . JOINT REPLACEMENT    . REPLACEMENT TOTAL KNEE Right    2001,2006,2009,2013  . REPLACEMENT TOTAL KNEE Left 2011   Allergies  Allergen Reactions  . Dilaudid [Hydromorphone Hcl]    Prior to Admission medications   Medication Sig Start Date End Date Taking? Authorizing Provider  acetaminophen (TYLENOL) 500 MG tablet Take 500 mg by mouth every 6 (six) hours as needed.    [provider]  azithromycin (ZITHROMAX) 250  MG tablet Take 2 pills by mouth on day 1, then 1 pill by mouth per day on days 2 through 5. 03/03/18   Wendie Agreste, MD  benzonatate (TESSALON) 100 MG capsule Take 1 capsule (100 mg total) by mouth 3 (three) times daily as needed for cough. 03/03/18   Wendie Agreste, MD  buPROPion [facility] SR) 200 MG 12 hr tablet TAKE 1 TABLET TWICE A DAY 09/07/17   Wendie Agreste, MD  fluticasone [facility]) 50 MCG/ACT nasal spray Place 2 sprays into both nostrils daily. 02/13/18   Wendie Agreste, MD  losartan (COZAAR) 100 MG  tablet TAKE 1 TABLET BY MOUTH ONCE DAILY 12/16/17   Wendie Agreste, MD  omeprazole (PRILOSEC) 20 MG capsule Take 1 capsule (20 mg total) by mouth daily. 03/03/18   Wendie Agreste, MD  ranitidine (ZANTAC) 150 MG tablet Take 1 tablet (150 mg total) by mouth at bedtime. 03/03/18   Wendie Agreste, MD   Social History   Socioeconomic History  . Marital status: Married    Spouse name: Not on file  . Number of children: Not on file  . Years of education: Not on file  . Highest education level: Bachelor's degree (e.g., BA, AB, BS)  Occupational History  . Occupation: Retired  Scientific laboratory technician  . Financial resource strain: Not hard at all  . Food insecurity:    Worry: Never true    Inability: Never true  . Transportation needs:    Medical: No    Non-medical: No  Tobacco Use  . Smoking status: Former Smoker    Types: Cigarettes    Last attempt to quit: 1971    Years since quitting: 48.4  . Smokeless tobacco: Never Used  Substance and Sexual Activity  . Alcohol use: Yes    Alcohol/week: 1.8 oz    Types: 3 Cans of beer per week    Comment: max 3 beers/week  . Drug use: No    Comment: former user of marijuana, quit in 1971  . Sexual activity: Yes  Lifestyle  . Physical activity:    Days per week: 0 days    Minutes per session: 0 min  . Stress: Not at all  Relationships  . Social connections:    Talks on phone: More than three times a week    Gets together: Once a week    Attends religious service: More than 4 times per year    Active member of club or organization: Yes    Attends meetings of clubs or organizations: More than 4 times per year    Relationship status: Married  . Intimate partner violence:    Fear of current or ex partner: No    Emotionally abused: No    Physically abused: No    Forced sexual activity: No  Other Topics Concern  . Not on file  Social History Narrative   Lives with his wife.   Education: College   Exercise: No   Drinks about 2 large coffees  per day   Right handed      Spent many years in [place].  Moved to [place] to be near their children.  Daughter lives in [place].  Their son has lived in the [place] area with plans to move to [place] in summer 2019.   Review of Systems  Constitutional: Negative for fatigue and unexpected weight change.  Eyes: Negative for visual disturbance.  Respiratory: Positive for cough. Negative for chest tightness and shortness of  breath.   Cardiovascular: Negative for chest pain, palpitations and leg swelling.  Gastrointestinal: Negative for abdominal pain and blood in stool.  Musculoskeletal: Positive for neck pain and neck stiffness.  Neurological: Negative for dizziness, light-headedness and headaches.       Objective:   Physical Exam  Constitutional: He is oriented to person, place, and time. He appears well-developed and well-nourished. No distress.  HENT:  Head: Normocephalic and atraumatic.  Eyes: Pupils are equal, round, and reactive to light. EOM are normal.  Neck: Neck supple.  Cardiovascular: Normal rate.  Pulmonary/Chest: Effort normal. No respiratory distress.  Musculoskeletal: Normal range of motion.  C-spine: no midline bony tenderness, some flexion and extension, minimal left and right rotation due to pain in paraspinals, some spasm in paraspinals  Neurological: He is alert and oriented to person, place, and time.  Skin: Skin is warm and dry.  Psychiatric: He has a normal mood and affect. His behavior is normal.  Nursing note and vitals reviewed.   Vitals:   03/13/18 1539  BP: (!) 146/84  Pulse: 94  Temp: 99.3 F (37.4 C)  TempSrc: Oral  SpO2: 99%  Weight: 211 lb (95.7 kg)  Height: 5' 10.5" (1.791 m)   Dg Chest 2 View  Result Date: 03/13/2018 CLINICAL DATA:  Persistent cough with recent worsening, fever and chills since yesterday, history hypertension EXAM: CHEST - 2 VIEW COMPARISON:  03/03/2018 FINDINGS: Normal heart size, mediastinal contours, and pulmonary  vascularity. Minimal atherosclerotic calcification at aortic arch. Probable tiny calcified granuloma RIGHT upper lobe. Lungs otherwise clear. No pulmonary infiltrate, pleural effusion, or pneumothorax. Scattered multilevel endplate spur formation thoracic spine. Probable BILATERAL chronic rotator cuff tears with degenerative changes of the RIGHT glenohumeral joint. IMPRESSION: No acute abnormalities. Electronically Signed   By: Lavonia Dana M.D.   On: 03/13/2018 16:53   Results for orders placed or performed in visit on 03/13/18  POCT CBC  Result Value Ref Range   WBC 9.7 4.6 - 10.2 K/uL   Lymph, poc 0.7 0.6 - 3.4   POC LYMPH PERCENT 7.1 (A) 10 - 50 %L   MID (cbc) 0.3 0 - 0.9   POC MID % 3.3 0 - 12 %M   POC Granulocyte 8.7 (A) 2 - 6.9   Granulocyte percent 89.6 (A) 37 - 80 %G   RBC 4.81 4.69 - 6.13 M/uL   Hemoglobin 14.3 14.1 - 18.1 g/dL   HCT, POC 43.4 (A) 43.5 - 53.7 %   MCV 90.1 80 - 97 fL   MCH, POC 29.7 27 - 31.2 pg   MCHC 32.9 31.8 - 35.4 g/dL   RDW, POC 13.8 %   Platelet Count, POC 286 142 - 424 K/uL   MPV 7.2 0 - 99.8 fL  POC Influenza A&B(BINAX/QUICKVUE)  Result Value Ref Range   Influenza A, POC Negative Negative   Influenza B, POC Negative Negative       Assessment & Plan:   Joseph Maldonado. is a 72 y.o. male Cough - Plan: POCT CBC, DG Chest 2 View, Ambulatory referral to Pulmonology, POC Influenza A&B(BINAX/QUICKVUE), HYDROcodone-homatropine (HYCODAN) 5-1.5 MG/5ML syrup, azithromycin (ZITHROMAX) 250 MG tablet Fever chills - Plan: POCT CBC, DG Chest 2 View, POC Influenza A&B(BINAX/QUICKVUE), azithromycin (ZITHROMAX) 250 MG tablet  -Previous chronic cough with secondary sickening.  Possible lower respiratory infection/early community-acquired pneumonia although not seen on chest x-ray.  He does have a slight left shift on blood counts with borderline leukocytosis.  -Start Z-Pak, potential side effects discussed  -  Hycodan cough syrup for cough, continue to avoid other  causes of upper airway cough syndrome and will refer to pulmonary for evaluation of an ongoing chronic cough.  -RTC/ER precautions if acute worsening Diarrhea, unspecified type  -Possibly due to current illness.  Symptomatic care discussed and RTC precautions if frequent/ Worsening.  Potential for worsening diarrhea with antibiotics was discussed.  Neck pain - Plan: POCT CBC, cyclobenzaprine (FLEXERIL) 5 MG tablet Neck muscle spasm - Plan: cyclobenzaprine (FLEXERIL) 5 MG tablet  -Likely due to frequent cough, muscular pain.  Denies photophobia/phonophobia, afebrile, unlikely meningitis but ER precautions given if headache fevers or worsening symptoms  Flu-like symptoms  -As above.  Flu testing was negative. Meds ordered this encounter  Medications  . HYDROcodone-homatropine (HYCODAN) 5-1.5 MG/5ML syrup    Sig: Take 5 mLs by mouth every 6 (six) hours as needed.    Dispense:  120 mL    Refill:  0  . azithromycin (ZITHROMAX) 250 MG tablet    Sig: Take 2 pills by mouth on day 1, then 1 pill by mouth per day on days 2 through 5.    Dispense:  6 tablet    Refill:  0  . cyclobenzaprine (FLEXERIL) 5 MG tablet    Sig: 1 pill by mouth up to every 8 hours as needed. Start with one pill by mouth each bedtime as needed due to sedation    Dispense:  15 tablet    Refill:  0   Patient Instructions   Hydrocodone cough syrup as needed, start antibiotic for chronic worsening cough.  From previous irritant cough I did refer you to pulmonary.  If any worsening of symptoms the next few days including higher fevers, difficulty with light or sound, or worsening headache, return for recheck here or at emergency room as we discussed.  For neck pain, that is likely due to coughing along with sleeping on different pillow.  The hydrocodone cough syrup may help, but I did write for a few muscle relaxants if needed.  Try not to combine those with other sedating medicines including the cough syrup.  Return to the  clinic or go to the nearest emergency room if any of your symptoms worsen or new symptoms occur.    IF you received an x-ray today, you will receive an invoice from [facility]. Please contact [facility] at [phone] with questions or concerns regarding your invoice.   IF you received labwork today, you will receive an invoice from [facility]. Please contact [facility] at [phone] with questions or concerns regarding your invoice.   Our billing staff will not be able to assist you with questions regarding bills from these companies.  You will be contacted with the lab results as soon as they are available. The fastest way to get your results is to activate your My Chart account. Instructions are located on the last page of this paperwork. If you have not heard from [name] regarding the results in 2 weeks, please contact this office.       I personally performed the services described in this documentation, which was scribed in my presence. The recorded information has been reviewed and considered for accuracy and completeness, addended by me as needed, and agree with information above.  Signed,   [name], MD [facility].  03/15/18 4:06 PM

## 2018-03-14 ENCOUNTER — Other Ambulatory Visit: Payer: Self-pay | Admitting: Family Medicine

## 2018-03-15 ENCOUNTER — Institutional Professional Consult (permissible substitution): Payer: Medicare Other | Admitting: Neurology

## 2018-03-18 DIAGNOSIS — R002 Palpitations: Secondary | ICD-10-CM | POA: Diagnosis not present

## 2018-03-23 ENCOUNTER — Encounter: Payer: Self-pay | Admitting: Internal Medicine

## 2018-03-23 ENCOUNTER — Ambulatory Visit (INDEPENDENT_AMBULATORY_CARE_PROVIDER_SITE_OTHER): Payer: Medicare Other | Admitting: Internal Medicine

## 2018-03-23 VITALS — BP 118/78 | HR 91 | Ht 70.5 in | Wt 205.0 lb

## 2018-03-23 DIAGNOSIS — R05 Cough: Secondary | ICD-10-CM

## 2018-03-23 DIAGNOSIS — R059 Cough, unspecified: Secondary | ICD-10-CM

## 2018-03-23 MED ORDER — PREDNISONE 10 MG PO TABS: ORAL_TABLET | ORAL | 0 refills | Status: DC

## 2018-03-23 MED ORDER — PANTOPRAZOLE SODIUM 40 MG PO TBEC: 40.0000 mg | DELAYED_RELEASE_TABLET | Freq: Every day | ORAL | 2 refills | Status: DC

## 2018-03-23 MED ORDER — BENZONATATE 200 MG PO CAPS: 200.0000 mg | ORAL_CAPSULE | Freq: Three times a day (TID) | ORAL | 1 refills | Status: DC | PRN

## 2018-03-23 NOTE — Patient Instructions (Addendum)
Tessalon 200 mg three times daily until cough gone for a week   Prednisone 10 mg take  4 each am x 2 days,   2 each am x 2 days,  1 each am x 2 days and stop   Pantoprazole 40 mg or omeprzole 20 x 2 with Zantac 150 mg after supper until no cough for two weeks then try off     GERD (REFLUX)  is an extremely common cause of respiratory symptoms just like yours , many times with no obvious heartburn at all.    It can be treated with medication, but also with lifestyle changes including elevation of the head of your bed (ideally with 6 inch  bed blocks),  Smoking cessation, avoidance of late meals, excessive alcohol, and avoid fatty foods, chocolate, peppermint, colas, red wine, and acidic juices such as orange juice.  NO MINT OR MENTHOL PRODUCTS SO NO COUGH DROPS   USE SUGARLESS CANDY INSTEAD (Jolley ranchers or Stover's or Life Savers) or even ice chips will also do - the key is to swallow to prevent all throat clearing. NO OIL BASED VITAMINS - use powdered substitutes.    Please see patient coordinator before you leave today  to schedule sinus and chest and I will you with results   Return in 2 weeks if not all better on this plan

## 2018-03-23 NOTE — Progress Notes (Signed)
Subjective:     Patient ID: Joseph Maldonado, male   DOB: [birth year],    MRN: [id number]    Brief patient profile:  72 yowm minimal smoking hx quit age 73 with recurrent pattern of cough going back to around 2007/8 while living [place] with neg w/u for asthma but persistent pattern since then about  q 2 year severe cough so referred to pulmonary clinic 09/29/2016 by Dr   [name]   History of Present Illness  09/29/2016 1st [facility] office visit/ [name]   Chief Complaint  Patient presents with  . Pulm Consult    Unproductive cough since Nov. 2017. Usually gets this every year but this round isn't responding to medications. No SOB.   onset was acute p dust exposure in mid Nov 2017 day >> noct dry > wet and last exp to dry wall dust mid December  [facility] did CT of bladder and kidneys and found calcium  deposit > rec CT fall 2018  Started omeprazole 20 mg daily /zyretc/zantac/ tessalon just ran out  One day prior to OV  And cough some better rec Pantoprazole (protonix) 40 mg (double the prilosec is the same)   Take  30-60 min before first meal of the day and  Zantac 150 mg at bedtime until no cough for a week and if not better after 2 weeks then need to see [name] GERD diet     03/23/2018  Acute extended  ov/Clemens Lachman re: recurrent cough  Chief Complaint  Patient presents with  . Acute Visit    cough x 6 wks- occ prod with clear sputum.    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   Sleeping fine on back 1 pillow  without nocturnal  or early am exacerbation  of respiratory  c/o's or need for noct saba. Also denies any obvious fluctuation of symptoms with weather or environmental changes or other aggravating or alleviating factors except as outlined above   No unusual exposure hx or h/o childhood pna/ asthma or knowledge of premature birth.  Current Allergies, Complete Past Medical History, Past Surgical History, Family History, and Social History were reviewed in [employer] record.  ROS  The following are not active complaints unless bolded Hoarseness, sore throat, dysphagia, dental problems, itching, sneezing,  nasal congestion or discharge of excess mucus or purulent secretions, ear ache,   fever, chills, sweats, unintended wt loss or wt gain, classically pleuritic or exertional cp,  orthopnea pnd or arm/hand swelling  or leg swelling, presyncope, palpitations, abdominal pain, anorexia, nausea, vomiting, diarrhea  or change in bowel habits or change in bladder habits, change in stools or change in urine, dysuria, hematuria,  rash, arthralgias, visual complaints, headache, numbness, weakness or ataxia or problems with walking  or coordination,  change in mood or  memory.        Current Meds  Medication Sig  . acetaminophen (TYLENOL) 500 MG tablet Take 500 mg by mouth every 6 (six) hours as needed.  Marland Kitchen buPROPion (WELLBUTRIN SR) 200 MG 12 hr tablet TAKE 1 TABLET TWICE A DAY  . cyclobenzaprine (FLEXERIL) 5 MG tablet 1 pill by mouth up to every 8 hours as needed. Start with one pill by mouth each bedtime as needed due to sedation  . fluticasone (FLONASE) 50 MCG/ACT nasal spray Place 2 sprays into both nostrils daily.  Marland Kitchen losartan (COZAAR) 100 MG tablet TAKE 1 TABLET BY MOUTH EVERY DAY  . ranitidine (ZANTAC) 150 MG tablet Take 1 tablet  (150 mg total) by mouth at bedtime.  . [  omeprazole (PRILOSEC) 20 MG capsule Take 1 capsule (20 mg total) by mouth daily.            Objective:   Physical Exam  amb wm mildly hoarse wm nad     03/23/2018        205  09/29/16 210 lb (95.3 kg)  09/15/16 210 lb (95.3 kg)  09/05/16 210 lb (95.3 kg)      Vital signs reviewed - Note on arrival 02 sats  95% on RA    HEENT: nl dentition, turbinates bilaterally, and oropharynx. Nl external ear canals without cough reflex   NECK :  without JVD/Nodes/TM/ nl carotid upstrokes bilaterally   LUNGS: no acc muscle use,  Nl contour chest which is clear to A and P bilaterally without cough on insp or exp maneuvers   CV:  RRR  no s3 or murmur or increase in P2, and no edema   ABD:  soft and nontender with nl inspiratory excursion in the supine position. No bruits or organomegaly appreciated, bowel sounds nl  MS:  Nl gait/ ext warm without deformities, calf tenderness, cyanosis or clubbing No obvious joint restrictions   SKIN: warm and dry without lesions    NEURO:  alert, approp, nl sensorium with  no motor or cerebellar deficits apparent.       I personally reviewed images and agree with radiology impression as follows:  CXR:   03/13/18  No acute abnormalities.     Assessment:

## 2018-03-24 ENCOUNTER — Encounter: Payer: Self-pay | Admitting: Internal Medicine

## 2018-03-24 NOTE — Assessment & Plan Note (Addendum)
W/u for asthma [facility] 2008 > neg Max gerd rx 09/29/2016 > resolved  - recurred around Mid may 2019 s antecedent uri  - CT chest  Ordered 03/23/2018  - CT sinus ordered 03/23/2018    Recucurrent cough day, dry ? Etiology previous improved on max gerd rx. Of the three most common causes of  Sub-acute / recurrent or chronic cough, only one (GERD)  can actually contribute to/ trigger  the other two (asthma and post nasal drip syndrome)  and perpetuate the cylce of cough.  While not intuitively obvious, many patients with chronic low grade reflux do not cough until there is a primary insult that disturbs the protective epithelial barrier and exposes sensitive nerve endings.   This is typically viral but can due to PNDS and  either may apply here.   The point is that once this occurs, it is difficult to eliminate the cycle  using anything but a maximally effective acid suppression regimen at least in the short run, accompanied by an appropriate diet to address non acid GERD and control / eliminate the cough itself for at least 3a week with high dose tessilon and short course prednisone can be repeated to cover any allergic/inflammatory component then return for further w/u if not improved.   Since this is a recurrent cough not easily controlled reasonable to do sinus ct and chest ct as well now.    I had an extended discussion with the patient and wife reviewing all relevant studies completed to date and  lasting 15 to 20 minutes of a 25 minute acute office visit    Each maintenance medication was reviewed in detail including most importantly the difference between maintenance and prns and under what circumstances the prns are to be triggered using an action plan format that is not reflected in the computer generated alphabetically organized AVS.    Please see AVS for specific instructions unique to this visit that I personally wrote and verbalized to the the pt in detail and then reviewed with pt   by my nurse highlighting any  changes in therapy recommended at today's visit to their plan of care.

## 2018-03-27 DIAGNOSIS — D225 Melanocytic nevi of trunk: Secondary | ICD-10-CM | POA: Diagnosis not present

## 2018-03-27 DIAGNOSIS — D0439 Carcinoma in situ of skin of other parts of face: Secondary | ICD-10-CM | POA: Diagnosis not present

## 2018-03-27 DIAGNOSIS — L814 Other melanin hyperpigmentation: Secondary | ICD-10-CM | POA: Diagnosis not present

## 2018-03-27 DIAGNOSIS — L820 Inflamed seborrheic keratosis: Secondary | ICD-10-CM | POA: Diagnosis not present

## 2018-03-27 DIAGNOSIS — L821 Other seborrheic keratosis: Secondary | ICD-10-CM | POA: Diagnosis not present

## 2018-03-28 DIAGNOSIS — N183 Chronic kidney disease, stage 3 (moderate): Secondary | ICD-10-CM | POA: Diagnosis not present

## 2018-03-28 DIAGNOSIS — R319 Hematuria, unspecified: Secondary | ICD-10-CM | POA: Diagnosis not present

## 2018-03-28 DIAGNOSIS — E875 Hyperkalemia: Secondary | ICD-10-CM | POA: Diagnosis not present

## 2018-03-28 DIAGNOSIS — I129 Hypertensive chronic kidney disease with stage 1 through stage 4 chronic kidney disease, or unspecified chronic kidney disease: Secondary | ICD-10-CM | POA: Diagnosis not present

## 2018-04-06 ENCOUNTER — Encounter: Payer: Self-pay | Admitting: Neurology

## 2018-04-06 ENCOUNTER — Ambulatory Visit (INDEPENDENT_AMBULATORY_CARE_PROVIDER_SITE_OTHER): Payer: Medicare Other | Admitting: Neurology

## 2018-04-06 ENCOUNTER — Ambulatory Visit (INDEPENDENT_AMBULATORY_CARE_PROVIDER_SITE_OTHER)
Admission: RE | Admit: 2018-04-06 | Discharge: 2018-04-06 | Disposition: A | Discharge status: Home / Self Care | Payer: Medicare Other | Source: Ambulatory Visit | Attending: Internal Medicine | Admitting: Internal Medicine

## 2018-04-06 ENCOUNTER — Telehealth: Payer: Self-pay | Admitting: Neurology

## 2018-04-06 VITALS — BP 148/83 | HR 90 | Ht 70.5 in | Wt 211.5 lb

## 2018-04-06 DIAGNOSIS — Z8249 Family history of ischemic heart disease and other diseases of the circulatory system: Secondary | ICD-10-CM | POA: Diagnosis not present

## 2018-04-06 DIAGNOSIS — R4689 Other symptoms and signs involving appearance and behavior: Secondary | ICD-10-CM | POA: Diagnosis not present

## 2018-04-06 DIAGNOSIS — R059 Cough, unspecified: Secondary | ICD-10-CM

## 2018-04-06 DIAGNOSIS — R05 Cough: Secondary | ICD-10-CM | POA: Diagnosis not present

## 2018-04-06 DIAGNOSIS — Z808 Family history of malignant neoplasm of other organs or systems: Secondary | ICD-10-CM

## 2018-04-06 DIAGNOSIS — R0681 Apnea, not elsewhere classified: Secondary | ICD-10-CM | POA: Diagnosis not present

## 2018-04-06 DIAGNOSIS — R0683 Snoring: Secondary | ICD-10-CM | POA: Diagnosis not present

## 2018-04-06 DIAGNOSIS — R918 Other nonspecific abnormal finding of lung field: Secondary | ICD-10-CM | POA: Diagnosis not present

## 2018-04-06 DIAGNOSIS — R351 Nocturia: Secondary | ICD-10-CM

## 2018-04-06 NOTE — Patient Instructions (Addendum)
Thank you for choosing [facility] for your sleep related care! It was nice to meet you today! I appreciate that you entrust me with your sleep related healthcare concerns. I hope, I was able to address at least some of your concerns today, and that I can help you feel reassured and also get better.    Here is what we discussed today and what we came up with as our plan for you:    Based on your symptoms and your exam I believe you are at risk for obstructive sleep apnea (aka OSA), and I think we should proceed with a sleep study to determine whether you do or do not have OSA and how severe it is. Even, if you have mild OSA, I may want you to consider treatment with CPAP, as treatment of even borderline or mild sleep apnea can result and improvement of symptoms such as sleep disruption, daytime sleepiness, nighttime bathroom breaks, restless leg symptoms, improvement of headache syndromes, even improved mood disorder.   Please remember, the long-term risks and ramifications of untreated moderate to severe obstructive sleep apnea are: increased Cardiovascular disease, including congestive heart failure, stroke, difficult to control hypertension, treatment resistant obesity, arrhythmias, especially irregular heartbeat commonly known as A. Fib. (atrial fibrillation); even type 2 diabetes has been linked to untreated OSA.   Sleep apnea can cause disruption of sleep and sleep deprivation in most cases, which, in turn, can cause recurrent headaches, problems with memory, mood, concentration, focus, and vigilance. Most people with untreated sleep apnea report excessive daytime sleepiness, which can affect their ability to drive. Please do not drive if you feel sleepy. Patients with sleep apnea developed difficulty initiating and maintaining sleep (aka insomnia).   Having sleep apnea may increase your risk for other sleep disorders, including involuntary behaviors sleep such as sleep terrors,  sleep talking, sleepwalking.    Having sleep apnea can also increase your risk for restless leg syndrome and leg movements at night.   Please note that untreated obstructive sleep apnea may carry additional perioperative morbidity. Patients with significant obstructive sleep apnea (typically, in the moderate to severe degree) should receive, if possible, perioperative PAP (positive airway pressure) therapy and the surgeons and particularly the anesthesiologists should be informed of the diagnosis and the severity of the sleep disordered breathing.   I will likely see you back after your sleep study to go over the test results and where to go from there. We will call you after your sleep study to advise about the results (most likely, you will hear from [place], my nurse) and to set up an appointment at the time, as necessary.    Our sleep lab administrative assistant will call you to schedule your sleep study and give you further instructions, regarding the check in process for the sleep study, arrival time, what to bring, when you can expect to leave after the study, etc., and to answer any other logistical questions you may have. If you don't hear back from her by about 2 weeks from now, please feel free to call her direct line at [phone] or you can call our general clinic number, or email [name] through My Chart.   Due to your family history of brain aneurysm and your family history of brain cancer, as requested, I will order a brain MRI. Please remember that this will be an MRI without contrast due to your chronic kidney impairment.

## 2018-04-06 NOTE — Progress Notes (Signed)
Subjective:    Patient ID: Joseph Maldonado. is a 72 y.o. male.  HPI     Star Age, MD, PhD [facility] [address], [address] [place], [place] [address]  Dear Dr. Carlota Raspberry,  I saw your patient, Joseph Maldonado, upon your kind request in my neurologic clinic today for initial consultation of his sleep disorder, in particular, concern for underlying obstructive sleep apnea. The patient is unaccompanied today. As you know, [name] is a 72 year old right-handed gentleman with an underlying medical history of arthritis, chronic kidney disease, depression, hypertension, reflux disease, chronic cough and overweight state, who reports snoring and excessive daytime somnolence. His wife is noted pauses in his breathing while asleep. He saw Dr. Jaynee Eagles last year after a fall. I reviewed your office note from 01/24/2018 as well as 03/13/2018. He recently saw pulmonology for his chronic cough. His Epworth sleepiness score is 3 out of 24, fatigue score is 32 out of 63. He denies a family history of sleep apnea. He reports a family history of brain aneurysm in his mother and brain cancer in his father. He would like to have a brain MRI done but cannot have contrast because of chronic kidney disease. For his chronic cough he was recently started on medication by pulmonology including anti-histamine and reflux medicine. His wife reports that there are some nights where he snores very little and has no apneas but there are some other nights where he has louder snoring and breathing pauses as well as gasping sounds. He has nocturia about 2-3 times per average night and denies morning headaches. 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 They have 2 grown children, a daughter and a son. Wife reports that his family and she have noticed changes in his behavior and low frustration tolerance recently. Of note, patient as his intolerance is provoked. He quit smoking in 1970. He  drinks less than 2 beers per week and has not had any alcohol in several weeks. He drinks caffeine in the form of 2 large cups per day in the mornings. They have 2 dogs and 1 cat, dogs are often in the bedroom at night. His bedtime is between 10 and 11 PM, rise time around 7 or 7:30. He had a uvulectomy when he was in his 65s for snoring and he had a tonsillectomy as a child. He typically sleeps on his back because of bilateral shoulder pain. He had his left hip replaced as well as both knees.  His Past Medical History Is Significant For: Past Medical History:  Diagnosis Date  . Arthritis   . Chronic kidney disease   . Depression   . Hematuria   . Hypertension     His Past Surgical History Is Significant For: Past Surgical History:  Procedure Laterality Date  . CATARACT EXTRACTION, BILATERAL    . HIP SURGERY  2012  . JOINT REPLACEMENT    . REPLACEMENT TOTAL KNEE Right    2001,2006,2009,2013  . REPLACEMENT TOTAL KNEE Left 2011    His Family History Is Significant For: Family History  Problem Relation Age of Onset  . Cancer Mother        throat  . Hypertension Mother   . Heart disease Mother   . Emphysema Mother   . Hypertension Father   . Cancer Father        Brain  . Hypertension Sister   . Hematuria Sister   . Hematuria Brother  His Social History Is Significant For: Social History   Socioeconomic History  . Marital status: Married    Spouse name: Not on file  . Number of children: Not on file  . Years of education: Not on file  . Highest education level: Bachelor's degree (e.g., BA, AB, BS)  Occupational History  . Occupation: Retired  Scientific laboratory technician  . Financial resource strain: Not hard at all  . Food insecurity:    Worry: Never true    Inability: Never true  . Transportation needs:    Medical: No    Non-medical: No  Tobacco Use  . Smoking status: Former Smoker    Types: Cigarettes    Last attempt to quit: 1971    Years since quitting: 48.5  .  Smokeless tobacco: Never Used  Substance and Sexual Activity  . Alcohol use: Yes    Alcohol/week: 1.8 oz    Types: 3 Cans of beer per week    Comment: max 3 beers/week  . Drug use: No    Comment: former user of marijuana, quit in 1971  . Sexual activity: Yes  Lifestyle  . Physical activity:    Days per week: 0 days    Minutes per session: 0 min  . Stress: Not at all  Relationships  . Social connections:    Talks on phone: More than three times a week    Gets together: Once a week    Attends religious service: More than 4 times per year    Active member of club or organization: Yes    Attends meetings of clubs or organizations: More than 4 times per year    Relationship status: Married  Other Topics Concern  . Not on file  Social History Narrative   Lives with his wife.   Education: College   Exercise: No   Drinks about 2 large coffees per day   Right handed      Spent many years in [place].  Moved to [place] to be near their children.  Daughter lives in [place].  Their son has lived in the [place] area with plans to move to [place] in summer 2019.    His Allergies Are:  Allergies  Allergen Reactions  . Dilaudid [Hydromorphone Hcl]   :   His Current Medications Are:  Outpatient Encounter Medications as of 04/06/2018  Medication Sig  . acetaminophen (TYLENOL) 500 MG tablet Take 500 mg by mouth every 6 (six) hours as needed.  . benzonatate (TESSALON) 200 MG capsule Take 1 capsule (200 mg total) by mouth 3 (three) times daily as needed for cough.  Marland Kitchen buPROPion (WELLBUTRIN SR) 200 MG 12 hr tablet TAKE 1 TABLET TWICE A DAY  . fluticasone (FLONASE) 50 MCG/ACT nasal spray Place 2 sprays into both nostrils daily.  Marland Kitchen losartan (COZAAR) 100 MG tablet TAKE 1 TABLET BY MOUTH EVERY DAY  . pantoprazole (PROTONIX) 40 MG tablet Take 1 tablet (40 mg total) by mouth daily. Take 30-60 min before first meal of the day  . ranitidine (ZANTAC) 150 MG tablet Take 1 tablet (150 mg total) by  mouth at bedtime.  . [DISCONTINUED] cyclobenzaprine (FLEXERIL) 5 MG tablet 1 pill by mouth up to every 8 hours as needed. Start with one pill by mouth each bedtime as needed due to sedation  . [DISCONTINUED] predniSONE (DELTASONE) 10 MG tablet Take  4 each am x 2 days,   2 each am x 2 days,  1 each am x 2 days and stop  No facility-administered encounter medications on file as of 04/06/2018.   :  Review of Systems:  Out of a complete 14 point review of systems, all are reviewed and negative with the exception of these symptoms as listed below:  Review of Systems  Neurological:       Patient has concerns for sleep apnea, but he and his wife also wanted to mention that his father had a misdiagnosed brain tumor and they are concerned.   Epworth Sleepiness Scale 0= would never doze 1= slight chance of dozing 2= moderate chance of dozing 3= high chance of dozing  Sitting and reading:0 Watching TV:1 Sitting inactive in a public place (ex. Theater or meeting):0 As a passenger in a car for an hour without a break:0 Lying down to rest in the afternoon:2 Sitting and talking to someone:0 Sitting quietly after lunch (no alcohol):0 In a car, while stopped in traffic:0 Total:3    Objective:  Neurological Exam  Physical Exam Physical Examination:   Vitals:   04/06/18 1314  BP: (!) 148/83  Pulse: 90    General Examination: The patient is a very pleasant 72 y.o. male in no acute distress. He appears well-developed and well-nourished and well groomed.   HEENT: Normocephalic, atraumatic, pupils are equal, round and reactive to light and accommodation. Extraocular tracking is good without limitation to gaze excursion or nystagmus noted. Normal smooth pursuit is noted. Hearing is grossly intact, b/l Hearing aids in place. Face is symmetric with normal facial animation and normal facial sensation. Speech is clear with no dysarthria noted. There is no hypophonia. There is no lip, neck/head, jaw  or voice tremor. Neck is supple with full range of passive and active motion. There are no carotid bruits on auscultation. Oropharynx exam reveals: mild mouth dryness, adequate dental hygiene and mild airway crowding, due to redundant soft palate. Mallampati is class I. Tongue protrudes centrally and palate elevates symmetrically. Tonsils are absent, uvula absent. Neck size is 16.25 inches.   Chest: Clear to auscultation without wheezing, rhonchi or crackles noted.  Heart: S1+S2+0, regular and normal without murmurs, rubs or gallops noted.   Abdomen: Soft, non-tender and non-distended with normal bowel sounds appreciated on auscultation.  Extremities: There is no pitting edema in the distal lower extremities bilaterally. Pedal pulses are intact.  Skin: Warm and dry without trophic changes noted. There are mild varicose veins.  Musculoskeletal: exam reveals unremarkable scars from knee replacement surgeries but right knee is larger in caliber than left. He has mildly decreased range of motion in both shoulders.    Neurologically:  Mental status: The patient is awake, alert and oriented in all 4 spheres. His immediate and remote memory, attention, language skills and fund of knowledge are appropriate. There is no evidence of aphasia, agnosia, apraxia or anomia. Speech is clear with normal prosody and enunciation. Thought process is linear. Mood is normal and affect is blunted.  Cranial nerves II - XII are as described above under HEENT exam. In addition: shoulder shrug is normal with equal shoulder height noted. Motor exam: Normal bulk, strength and tone is noted. There is no drift, tremor or rebound. 1+ in the upper extremity reflexes and absent in the lower extremities. Fine motor skills are grossly intact. Cerebellar testing shows no dysmetria or intention tremor.  Sensory exam: intact to light touch.   Gait, station and balance: He stands with mild difficulty. Posture is age-appropriate. He  walks with a limp on the right.  Assessment and Plan:  In summary, Jamorion Gomillion. is a very pleasant 72 y.o.-year old male with an underlying medical history of arthritis, chronic kidney disease, depression, hypertension, reflux disease, chronic cough and overweight state, whose history and physical exam concerning for obstructive sleep apnea (OSA). I had a long chat with the patient and his wife about my findings and the diagnosis of OSA, its prognosis and treatment options. We talked about medical treatments, surgical interventions and non-pharmacological approaches. I explained in particular the risks and ramifications of untreated moderate to severe OSA, especially with respect to developing cardiovascular disease down the Road, including congestive heart failure, difficult to treat hypertension, cardiac arrhythmias, or stroke. Even type 2 diabetes has, in part, been linked to untreated OSA. Symptoms of untreated OSA include daytime sleepiness, memory problems, mood irritability and mood disorder such as depression and anxiety, lack of energy, as well as recurrent headaches, especially morning headaches. We talked about trying to maintain a healthy lifestyle in general, as well as the importance of weight control. I encouraged the patient to eat healthy, exercise daily and keep well hydrated, to keep a scheduled bedtime and wake time routine, to not skip any meals and eat healthy snacks in between meals. I advised the patient not to drive when feeling sleepy. I recommended the following at this time: sleep study with potential positive airway pressure titration. (We will score hypopneas at 4%).  As per their request, I will also order a brain MRI, I cannot order one with contrast because of his chronic kidney impairment. They understand that this would not be a fully complete study, he reports a family history of brain aneurysm and brain cancer and wife is worried about his recent personality changes.  He is advised to address mood and personality changes with you as well. I explained the sleep test procedure to the patient and also outlined possible surgical and non-surgical treatment options of OSA, including the use of a custom-made dental device (which would require a referral to a specialist dentist or oral surgeon), upper airway surgical options, such as pillar implants, radiofrequency surgery, tongue base surgery, and UPPP (which would involve a referral to an ENT surgeon). Rarely, jaw surgery such as mandibular advancement may be considered.  I also explained the CPAP treatment option to the patient, who indicated that he would be willing to try CPAP if the need arises. I explained the importance of being compliant with PAP treatment, not only for insurance purposes but primarily to improve His symptoms, and for the patient's long term health benefit, including to reduce His cardiovascular risks. I answered all their questions today and the patient and his wife were in agreement. I plan to see him back after the sleep study is completed and encouraged them to call with any interim questions, concerns, problems or updates.   Thank you very much for allowing me to participate in the care of this nice patient. If I can be of any further assistance to you please do not hesitate to call me at [phone].  Sincerely,   Star Age, MD, PhD

## 2018-04-06 NOTE — Telephone Encounter (Signed)
Medicare/bcbs order sent to GI. They will reach out to the pt to schedule.

## 2018-04-07 ENCOUNTER — Telehealth: Payer: Self-pay | Admitting: Internal Medicine

## 2018-04-07 ENCOUNTER — Encounter: Payer: Self-pay | Admitting: *Deleted

## 2018-04-07 DIAGNOSIS — D0439 Carcinoma in situ of skin of other parts of face: Secondary | ICD-10-CM | POA: Diagnosis not present

## 2018-04-07 MED ORDER — PREDNISONE 10 MG PO TABS: ORAL_TABLET | ORAL | 0 refills | Status: DC

## 2018-04-07 NOTE — Telephone Encounter (Signed)
Message closed in error. 

## 2018-04-07 NOTE — Telephone Encounter (Signed)
Of tessalon not helping then try Prednisone 10 mg take  4 each am x 2 days,   2 each am x 2 days,  1 each am x 2 days and stop   For drainage / throat tickle can  try take CHLORPHENIRAMINE  4 mg otc - take one every 4 hours as needed - available over the counter- may cause drowsiness so start with just a bedtime dose or two and see how you tolerate it before trying in daytime

## 2018-04-07 NOTE — Telephone Encounter (Signed)
Spoke with pt. He is aware of his CT results. 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 Nothing further was needed.

## 2018-04-07 NOTE — Telephone Encounter (Signed)
LMOM TCB x3 Patient is active in [place] - will sent e-mail to patient as well (informed pt of this on the voicemail)

## 2018-04-07 NOTE — Telephone Encounter (Signed)
Called patient, unable to reach. Left message to give us a call back.  

## 2018-04-07 NOTE — Telephone Encounter (Signed)
Please advise on ct chest results, thanks

## 2018-04-07 NOTE — Telephone Encounter (Signed)
Attempted to call patient today regarding MW recommendations for his increase of cough. I did not receive an answer at time of call. I have left a voicemail message for pt to return call. X1  Will leave this in triage and try to call pt again later.

## 2018-04-07 NOTE — Telephone Encounter (Signed)
Sinus CT is fine. Chest CT shows a tiny nodule for which she needs follow-up in a year. Of greater concern is he has three-vessel coronary disease and slight enlargement of his aorta which are not immediate concerns but indicate that a formal cardiology evaluation should be done to set up follow-up for these problems long-term. If you're he has a cardiologist he should just going to make a routine appointment.

## 2018-04-07 NOTE — Telephone Encounter (Signed)
Called and spoke with patient regarding increase productive cough-clear more in the evenings. Pt is taking OTC Allergia each night, zantac and tessalon is helping some but not a lot. Pt is requesting if there is anything else that he could take to better help the cough at night time.  MW please advise.

## 2018-04-07 NOTE — Progress Notes (Signed)
Spoke with pt and notified of results per Dr. Wert. Pt verbalized understanding and denied any questions. 

## 2018-04-10 ENCOUNTER — Telehealth: Payer: Self-pay | Admitting: *Deleted

## 2018-04-10 NOTE — Telephone Encounter (Signed)
Called and spoke with patient, he states that he did pick up prednisone prescription. Nothing further is needed at this time. Will sign off.

## 2018-04-10 NOTE — Telephone Encounter (Signed)
UNABLE TO REACH PATIENT. Need rx insurance to get medication approved

## 2018-04-18 ENCOUNTER — Encounter: Payer: Self-pay | Admitting: Internal Medicine

## 2018-04-18 ENCOUNTER — Other Ambulatory Visit (INDEPENDENT_AMBULATORY_CARE_PROVIDER_SITE_OTHER): Payer: Medicare Other

## 2018-04-18 ENCOUNTER — Ambulatory Visit (INDEPENDENT_AMBULATORY_CARE_PROVIDER_SITE_OTHER): Payer: Medicare Other | Admitting: Internal Medicine

## 2018-04-18 VITALS — BP 120/70 | HR 70 | Ht 70.5 in | Wt 211.4 lb

## 2018-04-18 DIAGNOSIS — R911 Solitary pulmonary nodule: Secondary | ICD-10-CM | POA: Diagnosis not present

## 2018-04-18 DIAGNOSIS — R059 Cough, unspecified: Secondary | ICD-10-CM

## 2018-04-18 DIAGNOSIS — R05 Cough: Secondary | ICD-10-CM | POA: Diagnosis not present

## 2018-04-18 DIAGNOSIS — M1611 Unilateral primary osteoarthritis, right hip: Secondary | ICD-10-CM | POA: Diagnosis not present

## 2018-04-18 LAB — CBC WITH DIFFERENTIAL/PLATELET
Basophils Absolute: 0.1 10*3/uL (ref 0.0–0.1)
Basophils Relative: 0.5 % (ref 0.0–3.0)
Eosinophils Absolute: 0.4 10*3/uL (ref 0.0–0.7)
Eosinophils Relative: 3.4 % (ref 0.0–5.0)
HCT: 42.4 % (ref 39.0–52.0)
Hemoglobin: 14.1 g/dL (ref 13.0–17.0)
LYMPHS ABS: 1.8 10*3/uL (ref 0.7–4.0)
Lymphocytes Relative: 17.6 % (ref 12.0–46.0)
MCHC: 33.2 g/dL (ref 30.0–36.0)
MCV: 90.5 fl (ref 78.0–100.0)
MONOS PCT: 8.1 % (ref 3.0–12.0)
Monocytes Absolute: 0.9 10*3/uL (ref 0.1–1.0)
NEUTROS ABS: 7.4 10*3/uL (ref 1.4–7.7)
NEUTROS PCT: 70.4 % (ref 43.0–77.0)
PLATELETS: 269 10*3/uL (ref 150.0–400.0)
RBC: 4.69 Mil/uL (ref 4.22–5.81)
RDW: 14.6 % (ref 11.5–15.5)
WBC: 10.5 10*3/uL (ref 4.0–10.5)

## 2018-04-18 MED ORDER — BENZONATATE 200 MG PO CAPS: 200.0000 mg | ORAL_CAPSULE | Freq: Three times a day (TID) | ORAL | 1 refills | Status: DC | PRN

## 2018-04-18 NOTE — Progress Notes (Signed)
Subjective:     Patient ID: Joseph Maldonado, male   DOB: [birth year],    MRN: [id number]    Brief patient profile:  72 yowm minimal smoking hx quit age 72 with recurrent pattern of cough going back to around 2007/8 while living [place] with neg w/u for asthma but persistent pattern since then about  q 2 year severe cough so referred to pulmonary clinic 09/29/2016 by Dr   [name].   History of Present Illness  09/29/2016 1st [facility] office visit/ [facility]   Chief Complaint  Patient presents with  . Pulm Consult    Unproductive cough since Nov. 2017. Usually gets this every year but this round isn't responding to medications. No SOB.   onset was acute p dust exposure in mid Nov 2017 day >> noct dry > wet and last exp to dry wall dust mid December  [facility] did CT of bladder and kidneys and found calcium  deposit > rec CT fall 2018  Started omeprazole 20 mg daily /zyretc/zantac/ tessalon just ran out  One day prior to OV  And cough some better rec Pantoprazole (protonix) 40 mg (double the prilosec is the same)   Take  30-60 min before first meal of the day and  Zantac 150 mg at bedtime until no cough for a week and if not better after 2 weeks then need to see [name] GERD diet     03/23/2018  Acute extended  ov/Wert re: recurrent cough  Chief Complaint  Patient presents with  . Acute Visit    cough x 6 wks- occ prod with clear sputum.    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 Follow-up    Cough is much improved but has not completely resolved.   Dyspnea:  .notliim  Cough: still some daytime urge to clear throat Sleeping: fine  No obvious day to day or daytime variability or assoc excess/ purulent sputum or mucus plugs or hemoptysis or cp or chest tightness, subjective wheeze or overt sinus or hb symptoms.      Also denies any obvious fluctuation of symptoms with weather or environmental changes or other aggravating or alleviating factors except as outlined above   No unusual exposure hx  or h/o childhood pna/ asthma or knowledge of premature birth.  Current Allergies, Complete Past Medical History, Past Surgical History, Family History, and Social History were reviewed in [employer] record.  ROS  The following are not active complaints unless bolded Hoarseness, sore throat, dysphagia, dental problems, itching, sneezing,  nasal congestion or discharge of excess mucus or purulent secretions, ear ache,   fever, chills, sweats, unintended wt loss or wt gain, classically pleuritic or exertional cp,  orthopnea pnd or arm/hand swelling  or leg swelling, presyncope, palpitations, abdominal pain, anorexia, nausea, vomiting, diarrhea  or change in bowel habits or change in bladder habits, change in stools or change in urine, dysuria, hematuria,  rash, arthralgias, visual complaints, headache, numbness, weakness or ataxia or problems with walking  or coordination,  change in mood or  memory.        Current Meds  Medication Sig  . acetaminophen (TYLENOL) 500 MG tablet Take 500 mg by mouth every 6 (six) hours as needed.  . benzonatate (TESSALON) 200 MG capsule Take 1 capsule (200 mg total) by mouth 3 (three) times daily as needed for cough.  Marland Kitchen buPROPion (WELLBUTRIN SR) 200 MG 12 hr tablet TAKE 1 TABLET TWICE A DAY  . fluticasone (FLONASE) 50 MCG/ACT nasal spray Place 2 sprays into both nostrils daily.  Marland Kitchen losartan (COZAAR) 100 MG tablet TAKE 1 TABLET BY MOUTH EVERY DAY  . pantoprazole (PROTONIX) 40 MG tablet Take 1 tablet (40 mg total) by mouth daily. Take 30-60 min before first meal of the day  . ranitidine (ZANTAC) 150 MG tablet Take 1 tablet (150 mg total) by mouth at bedtime.  .                    Objective:   Physical Exam  amb hoarse wm  And freq throat clearing   Vital signs reviewed - Note on arrival 02 sats  99% on RA       04/18/2018       211  03/23/2018        205  09/29/16 210 lb (95.3 kg)  09/15/16 210 lb (95.3 kg)  09/05/16 210 lb (95.3 kg)         No jvd Oropharynx clear Neck supple Lungs clear bilaterally to A and P  RRR no s3 or or sign murmur Abd soft/ non tender/ ok excursion on isp Ext wam with no edema or clubbing noted Neuro  Alert/ No motor deficits       Labs ordered 04/18/2018  Allergy profile         Assessment:

## 2018-04-18 NOTE — Patient Instructions (Addendum)
Continue tessalon as needed   For drainage / throat tickle try take CHLORPHENIRAMINE  4 mg - take one every 4 hours as needed - available over the counter- may cause drowsiness so start with just a bedtime dose or two and see how you tolerate it before trying in daytime  Please remember to go to the lab department downstairs in the basement  for your tests - we will call you with the results when they are available.

## 2018-04-19 ENCOUNTER — Encounter: Payer: Self-pay | Admitting: Internal Medicine

## 2018-04-19 DIAGNOSIS — R002 Palpitations: Secondary | ICD-10-CM | POA: Diagnosis not present

## 2018-04-19 DIAGNOSIS — I10 Essential (primary) hypertension: Secondary | ICD-10-CM | POA: Diagnosis not present

## 2018-04-19 LAB — RESPIRATORY ALLERGY PROFILE REGION II ~~LOC~~
ALLERGEN, D PTERNOYSSINUS, D1: 4.83 kU/L — AB
Allergen, Cedar tree, t12: <0.1 kU/L
Allergen, Cottonwood, t14: <0.1 kU/L
Allergen, Mouse Urine Protein, e78: <0.1 kU/L
Allergen, Mulberry, t76: <0.1 kU/L
Allergen, Oak,t7: <0.1 kU/L
Aspergillus fumigatus, m3: <0.1 kU/L
Bermuda Grass: <0.1 kU/L
CLASS: 0
CLASS: 0
CLASS: 0
CLASS: 0
CLASS: 0
CLASS: 0
CLASS: 0
CLASS: 0
CLASS: 0
CLASS: 0
CLASS: 0
Cat Dander: <0.1 kU/L
Class: 0
Class: 0
Class: 0
Class: 0
Class: 0
Class: 0
Class: 0
Class: 0
Class: 0
Class: 0
Class: 0
Class: 3
Class: 3
Cockroach: <0.1 kU/L
D. farinae: 5.72 kU/L — ABNORMAL HIGH
IGE (IMMUNOGLOBULIN E), SERUM: 16 kU/L (ref <=114)
Rough Pigweed  IgE: <0.1 kU/L
Sheep Sorrel IgE: <0.1 kU/L
Timothy Grass: 0.25 kU/L — ABNORMAL HIGH

## 2018-04-19 LAB — INTERPRETATION:

## 2018-04-19 NOTE — Assessment & Plan Note (Signed)
See cxr 07/31/16 c/w calcified granuloma - CT chest 04/06/18 Solitary 8 mm solid lingular pulmonary nodule. Non-contrast chest CT at 6-12 months is recommended> no significant smoking hx so rec f/u 04/07/19 > in computer for recall   Discussed in detail all the  indications, usual  risks and alternatives  relative to the benefits with patient who agrees to proceed with conservative f/u as outlined

## 2018-04-19 NOTE — Assessment & Plan Note (Signed)
W/u for asthma [facility] 2008 > neg Max gerd rx 09/29/2016 > resolved  - recurred around Mid may 2019 s antecedent uri  - CT chest 04/06/18 spn only > see sep a/p - CT sinus  04/06/2018 Normal paranasal sinuses 04/18/2018 added 1st gen H1 blockers per guidelines    Allergy profile 04/18/2018 >  Eos 0.4 /  IgE    I had an extended final summary discussion with the patient reviewing all relevant studies completed to date and  lasting 15 to 20 minutes of a 25 minute visit on the following issues:    1) limited ddx for cough x 50 years with sense of pnds better on gerd rx but not eliminated typical of Upper airway cough syndrome (previously labeled PNDS),  is so named because it's frequently impossible to sort out how much is  CR/sinusitis with freq throat clearing (which can be related to primary GERD)   vs  causing  secondary (" extra esophageal")  GERD from wide swings in gastric pressure that occur with throat clearing, often  promoting self use of mint and menthol lozenges that reduce the lower esophageal sphincter tone and exacerbate the problem further in a cyclical fashion.   These are the same pts (now being labeled as having "irritable larynx syndrome" by some cough centers) who not infrequently have a history of having failed to tolerate ace inhibitors,  dry powder inhalers or biphosphonates or report having atypical/extraesophageal reflux symptoms that don't respond to standard doses of PPI  and are easily confused as having aecopd or asthma flares by even experienced allergists/ pulmonologists (myself included).   Rec:  Check allergy profile and if pos refer to allergy, if neg refer to [facility] voice center / Dr Joya Gaskins    Each maintenance medication was reviewed in detail including most importantly the difference between maintenance and as needed and under what circumstances the prns are to be used.  Please see AVS for specific  Instructions which are unique to this visit and I personally  typed out  which were reviewed in detail in writing with the patient and a copy provided.

## 2018-04-20 ENCOUNTER — Other Ambulatory Visit: Payer: Self-pay | Admitting: Internal Medicine

## 2018-04-20 DIAGNOSIS — R059 Cough, unspecified: Secondary | ICD-10-CM

## 2018-04-20 DIAGNOSIS — R05 Cough: Secondary | ICD-10-CM

## 2018-04-20 NOTE — Progress Notes (Signed)
Spoke with pt and notified of results per Dr. Wert. Pt verbalized understanding and denied any questions. 

## 2018-05-10 ENCOUNTER — Ambulatory Visit
Admission: RE | Admit: 2018-05-10 | Discharge: 2018-05-10 | Disposition: A | Discharge status: Home / Self Care | Payer: Medicare Other | Source: Ambulatory Visit | Attending: Neurology | Admitting: Neurology

## 2018-05-10 DIAGNOSIS — R0683 Snoring: Secondary | ICD-10-CM | POA: Diagnosis not present

## 2018-05-10 DIAGNOSIS — R4689 Other symptoms and signs involving appearance and behavior: Secondary | ICD-10-CM

## 2018-05-10 DIAGNOSIS — Z8249 Family history of ischemic heart disease and other diseases of the circulatory system: Secondary | ICD-10-CM

## 2018-05-10 DIAGNOSIS — R351 Nocturia: Secondary | ICD-10-CM

## 2018-05-10 DIAGNOSIS — Z808 Family history of malignant neoplasm of other organs or systems: Secondary | ICD-10-CM

## 2018-05-10 DIAGNOSIS — R0681 Apnea, not elsewhere classified: Secondary | ICD-10-CM

## 2018-05-12 ENCOUNTER — Telehealth: Payer: Self-pay

## 2018-05-12 DIAGNOSIS — I2510 Atherosclerotic heart disease of native coronary artery without angina pectoris: Secondary | ICD-10-CM | POA: Diagnosis not present

## 2018-05-12 DIAGNOSIS — R002 Palpitations: Secondary | ICD-10-CM | POA: Diagnosis not present

## 2018-05-12 DIAGNOSIS — I10 Essential (primary) hypertension: Secondary | ICD-10-CM | POA: Diagnosis not present

## 2018-05-12 DIAGNOSIS — I712 Thoracic aortic aneurysm, without rupture: Secondary | ICD-10-CM | POA: Diagnosis not present

## 2018-05-12 NOTE — Telephone Encounter (Signed)
I called pt to discuss his MRI results. No answer, left a message asking him to call me back. 

## 2018-05-12 NOTE — Telephone Encounter (Signed)
-----   Message from Star Age, MD sent at 05/12/2018 11:15 AM EDT ----- Brain MRI without contrast showed age appropriate findings, no old stroke, no acute findings. Overall reassuring. Please update pt. Star Age, MD, PhD [facility])

## 2018-05-12 NOTE — Progress Notes (Signed)
Brain MRI without contrast showed age appropriate findings, no old stroke, no acute findings. Overall reassuring. Please update pt. Star Age, MD, PhD [facility])

## 2018-05-17 NOTE — Telephone Encounter (Signed)
I called pt, advised him of his MRI results. Pt reports that he saw his results on mychart. Pt verbalized understanding of results. Pt had no questions at this time but was encouraged to call back if questions arise.

## 2018-05-22 ENCOUNTER — Ambulatory Visit (INDEPENDENT_AMBULATORY_CARE_PROVIDER_SITE_OTHER): Payer: Medicare Other | Admitting: Neurology

## 2018-05-22 DIAGNOSIS — Z8249 Family history of ischemic heart disease and other diseases of the circulatory system: Secondary | ICD-10-CM

## 2018-05-22 DIAGNOSIS — G4733 Obstructive sleep apnea (adult) (pediatric): Secondary | ICD-10-CM

## 2018-05-22 DIAGNOSIS — R0681 Apnea, not elsewhere classified: Secondary | ICD-10-CM

## 2018-05-22 DIAGNOSIS — G4720 Circadian rhythm sleep disorder, unspecified type: Secondary | ICD-10-CM

## 2018-05-22 DIAGNOSIS — R0683 Snoring: Secondary | ICD-10-CM

## 2018-05-22 DIAGNOSIS — R4689 Other symptoms and signs involving appearance and behavior: Secondary | ICD-10-CM

## 2018-05-22 DIAGNOSIS — Z808 Family history of malignant neoplasm of other organs or systems: Secondary | ICD-10-CM

## 2018-05-22 DIAGNOSIS — R351 Nocturia: Secondary | ICD-10-CM

## 2018-05-23 DIAGNOSIS — R490 Dysphonia: Secondary | ICD-10-CM | POA: Diagnosis not present

## 2018-05-23 DIAGNOSIS — Z87891 Personal history of nicotine dependence: Secondary | ICD-10-CM | POA: Diagnosis not present

## 2018-05-23 DIAGNOSIS — J383 Other diseases of vocal cords: Secondary | ICD-10-CM | POA: Diagnosis not present

## 2018-05-23 DIAGNOSIS — R05 Cough: Secondary | ICD-10-CM | POA: Diagnosis not present

## 2018-05-23 DIAGNOSIS — Z885 Allergy status to narcotic agent status: Secondary | ICD-10-CM | POA: Diagnosis not present

## 2018-05-25 ENCOUNTER — Telehealth: Payer: Self-pay

## 2018-05-25 DIAGNOSIS — R490 Dysphonia: Secondary | ICD-10-CM | POA: Diagnosis not present

## 2018-05-25 DIAGNOSIS — J387 Other diseases of larynx: Secondary | ICD-10-CM | POA: Diagnosis not present

## 2018-05-25 DIAGNOSIS — R05 Cough: Secondary | ICD-10-CM | POA: Diagnosis not present

## 2018-05-25 DIAGNOSIS — J383 Other diseases of vocal cords: Secondary | ICD-10-CM | POA: Diagnosis not present

## 2018-05-25 NOTE — Telephone Encounter (Signed)
-----   Message from Star Age, MD sent at 05/25/2018  7:38 AM EDT ----- Patient referred by Dr. Carlota Raspberry, seen by me on 04/06/18, split night sleep study on 05/22/18. Please call and notify patient that the recent sleep study confirmed the diagnosis of severe OSA. He did well with CPAP during the study with significant improvement of the respiratory events. Therefore, I would like start the patient on CPAP therapy at home by prescribing a machine for home use. I placed the order in the chart.  Please advise patient that we need a follow up appointment with either myself or one of our nurse practitioners in about 10 weeks post set-up to check for how the patient is feeling and how well the patient is using the machine, etc. Please go ahead and schedule the appointment, while you have the patient on the phone and make sure patient understands the importance of keeping this window for the FU appointment, as it is often an insurance requirement. Failing to adhere to this may result in losing coverage for sleep apnea treatment, at which point most patients are left with a choice of returning the machine or paying out of pocket (and we want neither of this to happen!).  Please re-enforce the importance of compliance with treatment and the need for [name] to monitor compliance data - again an insurance requirement and usually a good feedback for the patient as far as how they are doing.  Also remind patient, that any PAP machine or mask issues should be first addressed with the DME company, who provided the machine/mask.  Please ask if patient has a preference regarding DME company, may depend on the insurance too.  Please arrange for CPAP set up at home through a DME company of patient's choice.  Once you have spoken to the patient you can close the phone encounter. Please fax/route report to referring provider, thanks,   Star Age, MD, PhD [facility])

## 2018-05-25 NOTE — Progress Notes (Signed)
Patient referred by Dr. Carlota Raspberry, seen by me on 04/06/18, split night sleep study on 05/22/18. Please call and notify patient that the recent sleep study confirmed the diagnosis of severe OSA. He did well with CPAP during the study with significant improvement of the respiratory events. Therefore, I would like start the patient on CPAP therapy at home by prescribing a machine for home use. I placed the order in the chart.  Please advise patient that we need a follow up appointment with either myself or one of our nurse practitioners in about 10 weeks post set-up to check for how the patient is feeling and how well the patient is using the machine, etc. Please go ahead and schedule the appointment, while you have the patient on the phone and make sure patient understands the importance of keeping this window for the FU appointment, as it is often an insurance requirement. Failing to adhere to this may result in losing coverage for sleep apnea treatment, at which point most patients are left with a choice of returning the machine or paying out of pocket (and we want neither of this to happen!).  Please re-enforce the importance of compliance with treatment and the need for [name] to monitor compliance data - again an insurance requirement and usually a good feedback for the patient as far as how they are doing.  Also remind patient, that any PAP machine or mask issues should be first addressed with the DME company, who provided the machine/mask.  Please ask if patient has a preference regarding DME company, may depend on the insurance too.  Please arrange for CPAP set up at home through a DME company of patient's choice.  Once you have spoken to the patient you can close the phone encounter. Please fax/route report to referring provider, thanks,   Star Age, MD, PhD [facility])

## 2018-05-25 NOTE — Telephone Encounter (Signed)
I called pt, spoke to pt's wife Beverlee Nims per DPR. I advised pt's wife that Dr. Rexene Alberts reviewed pt's sleep study results and found that pt has severe osa but did well with the cpap during the latest sleep study. Dr. Rexene Alberts recommends that pt start a cpap at home. I reviewed PAP compliance expectations with the pt's wife. Pt's wife is agreeable to starting a CPAP. I advised pt's wife that an order will be sent to a DME, [facility], and [facility] will call the pt within about one week after they file with the pt's insurance. [facility] will show the pt how to use the machine, fit for masks, and troubleshoot the CPAP if needed. Pt's wife could not make a follow up appt for pt in November. She will call me to schedule this. I reiterated the importance of this follow up appt being between 31 and 90 days post cpap start. Pt's wife had no questions at this time but was encouraged to call back if questions arise.  When pt calls back, please schedule him for a follow up appt in November.

## 2018-05-25 NOTE — Addendum Note (Signed)
Addended by: Star Age on: 05/25/2018 07:38 AM   Modules accepted: Orders

## 2018-05-25 NOTE — Procedures (Signed)
PATIENT'S NAME:  Joseph Maldonado, Joseph Maldonado DOB:      02-Oct-1945      MR#:    [id number]     DATE OF RECORDING: 05/22/2018 REFERRING M.D.:  [name], MD Study Performed:  Split-Night Titration Study HISTORY: 72 year old man with a history of arthritis, chronic kidney disease, depression, hypertension, reflux disease, chronic cough and overweight state, who reports snoring and excessive daytime somnolence. His Epworth sleepiness score is 3 out of 24. The patient's weight 211 pounds with a height of 70 (inches), resulting in a BMI of 30. kg/m2. The patient's neck circumference measured 16.2 inches.  CURRENT MEDICATIONS: Tylenol, Tessalon, Wellbutrin, Flonase, Cozaar, Protonix, Zantac.   PROCEDURE:  This is a multichannel digital polysomnogram utilizing the Somnostar 11.2 system.  Electrodes and sensors were applied and monitored per AASM Specifications.   EEG, EOG, Chin and Limb EMG, were sampled at 200 Hz.  ECG, Snore and Nasal Pressure, Thermal Airflow, Respiratory Effort, CPAP Flow and Pressure, Oximetry was sampled at 50 Hz. Digital video and audio were recorded.      BASELINE STUDY WITHOUT CPAP RESULTS:  Lights Out was at 00:03 and Lights On at 06:38 for the night and split study start at 01:33, epoch 387. Total recording time (TRT) was 190, with a total sleep time (TST) of 126.5 minutes.   The patient's sleep latency was 91.5 minutes.  REM sleep was absent. The sleep efficiency was 66.6 %.    SLEEP ARCHITECTURE: WASO (Wake after sleep onset) was 55 minutes with severe sleep fragmentation noted, Stage N1 was 4 minutes, Stage N2 was 113 minutes, Stage N3 was 9.5 minutes and Stage R (REM sleep) was 0 minutes.  The percentages were Stage N1 3.2%, Stage N2 89.3%, which is markedly increased, Stage N3 7.5% and Stage R (REM sleep) was absent. The arousals were noted as: 22 were spontaneous, 0 were associated with PLMs, 38 were associated with respiratory events.  RESPIRATORY ANALYSIS:  There were a total  of 110 respiratory events:  27 obstructive apneas, 56 central apneas and 16 mixed apneas with a total of 99 apneas and an apnea index (AI) of 47.. There were 11 hypopneas with a hypopnea index of 5.2. The patient also had 0 respiratory event related arousals (RERAs).  Snoring was noted.     The total APNEA/HYPOPNEA INDEX (AHI) was 52.2 /hour and the total RESPIRATORY DISTURBANCE INDEX was 52.2 /hour.  0 events occurred in REM sleep and 94 events in NREM. The REM AHI was 0, /hour versus a non-REM AHI of 52.2 /hour. The patient spent 213.5 minutes sleep time in the supine position 62 minutes in non-supine. The supine AHI was 58.0 /hour versus a non-supine AHI of 37.8 /hour.  OXYGEN SATURATION & C02:  The wake baseline 02 saturation was 96%, with the lowest being 78%. Time spent below 89% saturation equaled 17 minutes.  PERIODIC LIMB MOVEMENTS: The patient had a total of 0 Periodic Limb Movements.  The Periodic Limb Movement (PLM) index was 0 /hour and the PLM Arousal index was 0 /hour. Audio and video analysis did not show any abnormal or unusual movements, behaviors, phonations or vocalizations. The patient took no bathroom breaks. Mild to moderate snoring was noted. The EKG was in keeping with normal sinus rhythm (NSR).   TITRATION STUDY WITH CPAP RESULTS:   The patient was fitted with a large Simplus FFM (due to being a mouth breather). CPAP was initiated at 5 cmH20 with heated humidity per AASM split night standards and  pressure was advanced to 12 cmH20 because of hypopneas, apneas and desaturations. At a PAP pressure of 12 cmH20, there was a reduction of the AHI to 0/hour with supine NREM sleep achieved and O2 nadir of 95%.   Total recording time (TRT) was 205.5 minutes, with a total sleep time (TST) of 148.5 minutes. The patient's sleep latency was 45.5 minutes. REM latency was 78 minutes.  The sleep efficiency was 72.3 %.    SLEEP ARCHITECTURE: Wake after sleep was 21 minutes, Stage N1 4  minutes, Stage N2 74 minutes, Stage N3 50 minutes and Stage R (REM sleep) 20.5 minutes. The percentages were: Stage N1 2.7%, Stage N2 49.8%, Stage N3 33.7%, which is increased, and Stage R (REM sleep) 13.8%. The arousals were noted as: 24 were spontaneous, 0 were associated with PLMs, 6 were associated with respiratory events.  RESPIRATORY ANALYSIS:  There were a total of 43 respiratory events: 12 obstructive apneas, 29 central apneas and 0 mixed apneas with a total of 41 apneas and an apnea index (AI) of 16.6. There were 2 hypopneas with a hypopnea index of .8 /hour. The patient also had 0 respiratory event related arousals (RERAs).      The total APNEA/HYPOPNEA INDEX  (AHI) was 17.4 /hour and the total RESPIRATORY DISTURBANCE INDEX was 17.4 /hour.  0 events occurred in REM sleep and 43 events in NREM. The REM AHI was 0 /hour versus a non-REM AHI of 20.2 /hour. REM sleep was achieved on a pressure of  cm/h2o (AHI was  .) The patient spent 83% of total sleep time in the supine position. The supine AHI was 20.9 /hour, versus a non-supine AHI of 0.0/hour.  OXYGEN SATURATION & C02:  The wake baseline 02 saturation was 98%, with the lowest being 81%. Time spent below 89% saturation equaled 3 minutes.  PERIODIC LIMB MOVEMENTS: The patient had a total of 0 Periodic Limb Movements. The Periodic Limb Movement (PLM) index was 0 /hour and the PLM Arousal index was 0 /hour.  Post-study, the patient indicated that sleep was worse than usual.  POLYSOMNOGRAPHY IMPRESSION :   1. Obstructive Sleep Apnea (OSA)  2. Dysfunctions associated with sleep stages or arousals from sleep  RECOMMENDATIONS:  1. This patient has severe obstructive sleep apnea and responded well on CPAP therapy. I will, therefore, start the patient on home CPAP treatment at a pressure of 12 cm via FFM, with heated humidity. The patient should be reminded to be fully compliant with PAP therapy to improve sleep related symptoms and decrease long  term cardiovascular risks. Please note that untreated obstructive sleep apnea may carry additional perioperative morbidity. Patients with significant obstructive sleep apnea should receive perioperative PAP therapy and the surgeons and particularly the anesthesiologist should be informed of the diagnosis and the severity of the sleep disordered breathing. 2. This study shows sleep fragmentation and abnormal sleep stage percentages; these are nonspecific findings and per se do not signify an intrinsic sleep disorder or a cause for the patient's sleep-related symptoms. Causes include (but are not limited to) the first night effect of the sleep study, circadian rhythm disturbances, medication effect or an underlying mood disorder or medical problem.  3. The patient should be cautioned not to drive, work at heights, or operate dangerous or heavy equipment when tired or sleepy. Review and reiteration of good sleep hygiene measures should be pursued with any patient. 4. The patient will be seen in follow-up in the sleep clinic at [facility] for discussion of the  test results, symptom and treatment compliance review, further management strategies, etc. The referring provider will be notified of the test results.  I certify that I have reviewed the entire raw data recording prior to the issuance of this report in accordance with the Standards of Accreditation of the American Academy of Sleep Medicine (AASM)   Star Age, MD, PhD Diplomat, American Board of Neurology and Sleep Medicine (Neurology and Sleep Medicine)

## 2018-05-26 ENCOUNTER — Other Ambulatory Visit (HOSPITAL_COMMUNITY): Payer: Self-pay | Admitting: Cardiology

## 2018-05-26 DIAGNOSIS — I2510 Atherosclerotic heart disease of native coronary artery without angina pectoris: Secondary | ICD-10-CM

## 2018-05-26 DIAGNOSIS — I7121 Aneurysm of the ascending aorta, without rupture: Secondary | ICD-10-CM

## 2018-05-26 DIAGNOSIS — I712 Thoracic aortic aneurysm, without rupture: Secondary | ICD-10-CM

## 2018-06-05 ENCOUNTER — Telehealth: Payer: Self-pay | Admitting: Family Medicine

## 2018-06-05 NOTE — Telephone Encounter (Signed)
Copied from [place] [id number]. Topic: Referral - Request >> Jun 05, 2018  9:42 AM Loma Boston wrote: Reason for CRM: Patient needs to get message to Dr Carlota Raspberry about a referral for a Rheumatologist. Dr Nyoka Cowden has spoken with him and said that is what he needs for his problem with his hands. They have gotten much worse and he is having hip surgery first of Nov and must have some relief. He trust whoever Dr Nyoka Cowden suggest and wants an appt asap. Pain much worse/alarmed

## 2018-06-06 NOTE — Telephone Encounter (Signed)
Pt called, he has spoke with [employer] on several occassions and has been told they "are working on it". The said it should not take 2 weeks to get CPAP approved. I advised him RN talked with his wife on 8/29. He is wanting RN to call and find out what is the hold up. Please call to advise

## 2018-06-06 NOTE — Telephone Encounter (Signed)
I have reached out to [employer] and asked them to call the pt. Unfortunately, I cannot do anything to speed up the insurance process on the DME side.

## 2018-06-07 DIAGNOSIS — D2261 Melanocytic nevi of right upper limb, including shoulder: Secondary | ICD-10-CM | POA: Diagnosis not present

## 2018-06-07 DIAGNOSIS — D2262 Melanocytic nevi of left upper limb, including shoulder: Secondary | ICD-10-CM | POA: Diagnosis not present

## 2018-06-07 DIAGNOSIS — L820 Inflamed seborrheic keratosis: Secondary | ICD-10-CM | POA: Diagnosis not present

## 2018-06-07 DIAGNOSIS — D225 Melanocytic nevi of trunk: Secondary | ICD-10-CM | POA: Diagnosis not present

## 2018-06-07 DIAGNOSIS — D2271 Melanocytic nevi of right lower limb, including hip: Secondary | ICD-10-CM | POA: Diagnosis not present

## 2018-06-07 DIAGNOSIS — D485 Neoplasm of uncertain behavior of skin: Secondary | ICD-10-CM | POA: Diagnosis not present

## 2018-06-07 DIAGNOSIS — L986 Other infiltrative disorders of the skin and subcutaneous tissue: Secondary | ICD-10-CM | POA: Diagnosis not present

## 2018-06-07 DIAGNOSIS — L821 Other seborrheic keratosis: Secondary | ICD-10-CM | POA: Diagnosis not present

## 2018-06-07 NOTE — Telephone Encounter (Signed)
Please see note below and give referral if possible

## 2018-06-07 NOTE — Telephone Encounter (Signed)
I called pt, spoke with Beverlee Nims, pt's wife, per DPR, and explained that I did speak with [employer] yesterday, and they will call pt with an update. Unfortunately, I cannot control how soon pt gets his cpap; that is between the DME and pt's insurance. I passed along pt's concerns to [employer], however. Pt's wife verbalized understanding.

## 2018-06-08 NOTE — Telephone Encounter (Signed)
We discussed his hands back in April, and at that point was thought to be due to arthritis.  Had recommended a few different approaches but if not improving to recheck in 4 weeks. If he is having severe pain, would recommend he be seen so we can determine if there is some other treatment we can do prior to seeing hand specialist or rheumatologist (depending on exam, may change needed referral). I am happy to see him or can see other provider if needed to be seen sooner. I have sent him a Mychart message with this info.

## 2018-06-09 ENCOUNTER — Other Ambulatory Visit: Payer: Self-pay | Admitting: Family Medicine

## 2018-06-15 DIAGNOSIS — I10 Essential (primary) hypertension: Secondary | ICD-10-CM | POA: Diagnosis not present

## 2018-06-20 ENCOUNTER — Ambulatory Visit (HOSPITAL_COMMUNITY)
Admission: RE | Admit: 2018-06-20 | Discharge: 2018-06-20 | Disposition: A | Discharge status: Home / Self Care | Payer: Medicare Other | Source: Ambulatory Visit | Attending: Cardiology | Admitting: Cardiology

## 2018-06-20 DIAGNOSIS — I2510 Atherosclerotic heart disease of native coronary artery without angina pectoris: Secondary | ICD-10-CM | POA: Diagnosis not present

## 2018-06-20 DIAGNOSIS — I712 Thoracic aortic aneurysm, without rupture: Secondary | ICD-10-CM | POA: Diagnosis not present

## 2018-06-20 DIAGNOSIS — I7121 Aneurysm of the ascending aorta, without rupture: Secondary | ICD-10-CM

## 2018-06-20 DIAGNOSIS — I700 Atherosclerosis of aorta: Secondary | ICD-10-CM | POA: Diagnosis not present

## 2018-06-20 DIAGNOSIS — R079 Chest pain, unspecified: Secondary | ICD-10-CM | POA: Diagnosis not present

## 2018-06-20 LAB — POCT I-STAT CREATININE: Creatinine, Ser: 1.2 mg/dL (ref 0.61–1.24)

## 2018-06-20 MED ORDER — IOPAMIDOL (ISOVUE-370) INJECTION 76%
INTRAVENOUS | Status: AC
  Filled 2018-06-20: qty 100

## 2018-06-20 MED ORDER — IOPAMIDOL (ISOVUE-370) INJECTION 76%
100.0000 mL | Freq: Once | INTRAVENOUS | Status: AC | PRN
  Administered 2018-06-20: 80 mL via INTRAVENOUS

## 2018-06-20 MED ORDER — NITROGLYCERIN 0.4 MG SL SUBL
SUBLINGUAL_TABLET | SUBLINGUAL | Status: AC
  Filled 2018-06-20: qty 2

## 2018-06-20 MED ORDER — METOPROLOL TARTRATE 5 MG/5ML IV SOLN
INTRAVENOUS | Status: AC
  Filled 2018-06-20: qty 20

## 2018-06-20 MED ORDER — METOPROLOL TARTRATE 5 MG/5ML IV SOLN
5.0000 mg | INTRAVENOUS | Status: DC | PRN
  Administered 2018-06-20: 5 mg via INTRAVENOUS
  Filled 2018-06-20: qty 5

## 2018-06-20 MED ORDER — NITROGLYCERIN 0.4 MG SL SUBL
0.8000 mg | SUBLINGUAL_TABLET | Freq: Once | SUBLINGUAL | Status: AC
  Administered 2018-06-20: 0.8 mg via SUBLINGUAL
  Filled 2018-06-20: qty 25

## 2018-06-29 DIAGNOSIS — J383 Other diseases of vocal cords: Secondary | ICD-10-CM | POA: Diagnosis not present

## 2018-06-29 DIAGNOSIS — J387 Other diseases of larynx: Secondary | ICD-10-CM | POA: Diagnosis not present

## 2018-06-29 DIAGNOSIS — R490 Dysphonia: Secondary | ICD-10-CM | POA: Diagnosis not present

## 2018-06-29 DIAGNOSIS — R05 Cough: Secondary | ICD-10-CM | POA: Diagnosis not present

## 2018-07-26 DIAGNOSIS — Z23 Encounter for immunization: Secondary | ICD-10-CM | POA: Diagnosis not present

## 2018-07-27 ENCOUNTER — Other Ambulatory Visit: Payer: Self-pay | Admitting: Orthopedic Surgery

## 2018-07-28 ENCOUNTER — Other Ambulatory Visit: Payer: Self-pay | Admitting: Orthopedic Surgery

## 2018-08-04 NOTE — Progress Notes (Signed)
EKG 01-28-18 Epic   ECHO 04-19-18 Epic   LOV PULM DR Melvyn Novas 04-18-18 Epic

## 2018-08-04 NOTE — Patient Instructions (Addendum)
Joseph Maldonado.  08/04/2018   Your procedure is scheduled on: 08-14-18   Report to [facility] Main  Entrance    Report to admitting at 9:30AM    Call this number if you have problems the morning of surgery [phone]   PLEASE BRING CPAP MASK AND  TUBING ONLY. DEVICE WILL BE PROVIDED!    Remember: Do not eat food or drink liquids :After Midnight. BRUSH YOUR TEETH MORNING OF SURGERY AND RINSE YOUR MOUTH OUT, NO CHEWING GUM CANDY OR MINTS.     Take these medicines the morning of surgery with A SIP OF WATER: TYLENOL IF NEEDED, WELLBUTRIN                                You may not have any metal on your body including hair pins and              piercings  Do not wear jewelry, make-up, lotions, powders or perfumes, deodorant                       Men may shave face and neck.   Do not bring valuables to the hospital. [place].  Contacts, dentures or bridgework may not be worn into surgery.  Leave suitcase in the car. After surgery it may be brought to your room.                  Please read over the following fact sheets you were given: _____________________________________________________________________             [facility] - Preparing for Surgery Before surgery, you can play an important role.  Because skin is not sterile, your skin needs to be as free of germs as possible.  You can reduce the number of germs on your skin by washing with CHG (chlorahexidine gluconate) soap before surgery.  CHG is an antiseptic cleaner which kills germs and bonds with the skin to continue killing germs even after washing. Please DO NOT use if you have an allergy to CHG or antibacterial soaps.  If your skin becomes reddened/irritated stop using the CHG and inform your nurse when you arrive at Short Stay. Do not shave (including legs and underarms) for at least 48 hours prior to the first CHG shower.  You may shave  your face/neck. Please follow these instructions carefully:  1.  Shower with CHG Soap the night before surgery and the  morning of Surgery.  2.  If you choose to wash your hair, wash your hair first as usual with your  normal  shampoo.  3.  After you shampoo, rinse your hair and body thoroughly to remove the  shampoo.                           4.  Use CHG as you would any other liquid soap.  You can apply chg directly  to the skin and wash                       Gently with a scrungie or clean washcloth.  5.  Apply the CHG Soap to your body ONLY FROM THE  NECK DOWN.   Do not use on face/ open                           Wound or open sores. Avoid contact with eyes, ears mouth and genitals (private parts).                       Wash face,  Genitals (private parts) with your normal soap.             6.  Wash thoroughly, paying special attention to the area where your surgery  will be performed.  7.  Thoroughly rinse your body with warm water from the neck down.  8.  DO NOT shower/wash with your normal soap after using and rinsing off  the CHG Soap.                9.  Pat yourself dry with a clean towel.            10.  Wear clean pajamas.            11.  Place clean sheets on your bed the night of your first shower and do not  sleep with pets. Day of Surgery : Do not apply any lotions/deodorants the morning of surgery.  Please wear clean clothes to the hospital/surgery center.  FAILURE TO FOLLOW THESE INSTRUCTIONS MAY RESULT IN THE CANCELLATION OF YOUR SURGERY PATIENT SIGNATURE_________________________________  NURSE SIGNATURE__________________________________  ________________________________________________________________________   Adam Phenix  An incentive spirometer is a tool that can help keep your lungs clear and active. This tool measures how well you are filling your lungs with each breath. Taking long deep breaths may help reverse or decrease the chance of developing  breathing (pulmonary) problems (especially infection) following:  A long period of time when you are unable to move or be active. BEFORE THE PROCEDURE   If the spirometer includes an indicator to show your best effort, your nurse or respiratory therapist will set it to a desired goal.  If possible, sit up straight or lean slightly forward. Try not to slouch.  Hold the incentive spirometer in an upright position. INSTRUCTIONS FOR USE  1. Sit on the edge of your bed if possible, or sit up as far as you can in bed or on a chair. 2. Hold the incentive spirometer in an upright position. 3. Breathe out normally. 4. Place the mouthpiece in your mouth and seal your lips tightly around it. 5. Breathe in slowly and as deeply as possible, raising the piston or the ball toward the top of the column. 6. Hold your breath for 3-5 seconds or for as long as possible. Allow the piston or ball to fall to the bottom of the column. 7. Remove the mouthpiece from your mouth and breathe out normally. 8. Rest for a few seconds and repeat Steps 1 through 7 at least 10 times every 1-2 hours when you are awake. Take your time and take a few normal breaths between deep breaths. 9. The spirometer may include an indicator to show your best effort. Use the indicator as a goal to work toward during each repetition. 10. After each set of 10 deep breaths, practice coughing to be sure your lungs are clear. If you have an incision (the cut made at the time of surgery), support your incision when coughing by placing a pillow or rolled up towels firmly against it. Once you are able  to get out of bed, walk around indoors and cough well. You may stop using the incentive spirometer when instructed by your caregiver.  RISKS AND COMPLICATIONS  Take your time so you do not get dizzy or light-headed.  If you are in pain, you may need to take or ask for pain medication before doing incentive spirometry. It is harder to take a deep  breath if you are having pain. AFTER USE  Rest and breathe slowly and easily.  It can be helpful to keep track of a log of your progress. Your caregiver can provide you with a simple table to help with this. If you are using the spirometer at home, follow these instructions: [place] IF:   You are having difficultly using the spirometer.  You have trouble using the spirometer as often as instructed.  Your pain medication is not giving enough relief while using the spirometer.  You develop fever of 100.5 F (38.1 C) or higher. SEEK IMMEDIATE MEDICAL CARE IF:   You cough up bloody sputum that had not been present before.  You develop fever of 102 F (38.9 C) or greater.  You develop worsening pain at or near the incision site. MAKE SURE YOU:   Understand these instructions.  Will watch your condition.  Will get help right away if you are not doing well or get worse. Document Released: 01/24/2007 Document Revised: 12/06/2011 Document Reviewed: 03/27/2007 [facility] Patient Information 2014 [place], [place].   ________________________________________________________________________

## 2018-08-07 ENCOUNTER — Encounter (HOSPITAL_COMMUNITY): Payer: Self-pay

## 2018-08-07 ENCOUNTER — Encounter (HOSPITAL_COMMUNITY)
Admission: RE | Admit: 2018-08-07 | Discharge: 2018-08-07 | Disposition: A | Discharge status: Home / Self Care | Payer: Medicare Other | Source: Ambulatory Visit | Attending: Orthopedic Surgery | Admitting: Orthopedic Surgery

## 2018-08-07 ENCOUNTER — Other Ambulatory Visit: Payer: Self-pay

## 2018-08-07 DIAGNOSIS — Z01818 Encounter for other preprocedural examination: Secondary | ICD-10-CM | POA: Diagnosis not present

## 2018-08-07 DIAGNOSIS — E669 Obesity, unspecified: Secondary | ICD-10-CM | POA: Diagnosis not present

## 2018-08-07 DIAGNOSIS — Z6830 Body mass index (BMI) 30.0-30.9, adult: Secondary | ICD-10-CM | POA: Diagnosis not present

## 2018-08-07 DIAGNOSIS — M1611 Unilateral primary osteoarthritis, right hip: Secondary | ICD-10-CM | POA: Insufficient documentation

## 2018-08-07 DIAGNOSIS — M2550 Pain in unspecified joint: Secondary | ICD-10-CM | POA: Diagnosis not present

## 2018-08-07 DIAGNOSIS — M150 Primary generalized (osteo)arthritis: Secondary | ICD-10-CM | POA: Diagnosis not present

## 2018-08-07 DIAGNOSIS — G4730 Sleep apnea, unspecified: Secondary | ICD-10-CM | POA: Diagnosis not present

## 2018-08-07 HISTORY — DX: Sleep apnea, unspecified: G47.30

## 2018-08-07 HISTORY — DX: Adverse effect of unspecified anesthetic, initial encounter: T41.45XA

## 2018-08-07 HISTORY — DX: Other complications of anesthesia, initial encounter: T88.59XA

## 2018-08-07 HISTORY — DX: Pain in unspecified hand: M79.643

## 2018-08-07 HISTORY — DX: Atherosclerotic heart disease of native coronary artery without angina pectoris: I25.10

## 2018-08-07 HISTORY — DX: Tachycardia, unspecified: R00.0

## 2018-08-07 LAB — CBC WITH DIFFERENTIAL/PLATELET
Abs Immature Granulocytes: 0.03 10*3/uL (ref 0.00–0.07)
BASOS ABS: 0 10*3/uL (ref 0.0–0.1)
BASOS PCT: 0 %
EOS ABS: 0.3 10*3/uL (ref 0.0–0.5)
Eosinophils Relative: 5 %
HCT: 43.4 % (ref 39.0–52.0)
Hemoglobin: 13.7 g/dL (ref 13.0–17.0)
IMMATURE GRANULOCYTES: 0 %
LYMPHS ABS: 1.4 10*3/uL (ref 0.7–4.0)
Lymphocytes Relative: 20 %
MCH: 29.7 pg (ref 26.0–34.0)
MCHC: 31.6 g/dL (ref 30.0–36.0)
MCV: 93.9 fL (ref 80.0–100.0)
Monocytes Absolute: 0.5 10*3/uL (ref 0.1–1.0)
Monocytes Relative: 7 %
NEUTROS PCT: 68 %
NRBC: 0 % (ref 0.0–0.2)
Neutro Abs: 4.7 10*3/uL (ref 1.7–7.7)
PLATELETS: 196 10*3/uL (ref 150–400)
RBC: 4.62 MIL/uL (ref 4.22–5.81)
RDW: 13.7 % (ref 11.5–15.5)
WBC: 7 10*3/uL (ref 4.0–10.5)

## 2018-08-07 LAB — URINALYSIS, ROUTINE W REFLEX MICROSCOPIC
BACTERIA UA: NONE SEEN
Bilirubin Urine: NEGATIVE
GLUCOSE, UA: NEGATIVE mg/dL
KETONES UR: NEGATIVE mg/dL
LEUKOCYTES UA: NEGATIVE
NITRITE: NEGATIVE
Protein, ur: NEGATIVE mg/dL
Specific Gravity, Urine: 1.015 (ref 1.005–1.030)
pH: 5 (ref 5.0–8.0)

## 2018-08-07 LAB — BASIC METABOLIC PANEL
ANION GAP: 9 (ref 5–15)
BUN: 28 mg/dL — ABNORMAL HIGH (ref 8–23)
CALCIUM: 9.4 mg/dL (ref 8.9–10.3)
CO2: 25 mmol/L (ref 22–32)
Chloride: 107 mmol/L (ref 98–111)
Creatinine, Ser: 1.29 mg/dL — ABNORMAL HIGH (ref 0.61–1.24)
GFR, EST NON AFRICAN AMERICAN: 54 mL/min — AB (ref >60)
Glucose, Bld: 90 mg/dL (ref 70–99)
Potassium: 4.4 mmol/L (ref 3.5–5.1)
Sodium: 141 mmol/L (ref 135–145)

## 2018-08-07 LAB — PROTIME-INR
INR: 0.99
Prothrombin Time: 13 seconds (ref 11.4–15.2)

## 2018-08-07 LAB — APTT: aPTT: 38 seconds — ABNORMAL HIGH (ref 24–36)

## 2018-08-07 LAB — ABO/RH: ABO/RH(D): O POS

## 2018-08-07 LAB — SURGICAL PCR SCREEN
MRSA, PCR: NEGATIVE
Staphylococcus aureus: POSITIVE — AB

## 2018-08-07 NOTE — Progress Notes (Signed)
Bmp, ptt routed via epic to Dr Frederik Pear

## 2018-08-11 ENCOUNTER — Other Ambulatory Visit: Payer: Self-pay | Admitting: Orthopedic Surgery

## 2018-08-11 NOTE — Care Plan (Signed)
Spoke with patient prior to surgery. Will discharge to home with family and HHPT. Has all needed equipment at home.    Ladell Heads, [place]

## 2018-08-12 DIAGNOSIS — M1611 Unilateral primary osteoarthritis, right hip: Secondary | ICD-10-CM | POA: Diagnosis present

## 2018-08-12 NOTE — H&P (Signed)
TOTAL HIP ADMISSION H&P  Patient is admitted for right total hip arthroplasty.  Subjective:  Chief Complaint: right hip pain  HPI: Joseph Parrot., 72 y.o. male, has a history of pain and functional disability in the right hip(s) due to arthritis and patient has failed non-surgical conservative treatments for greater than 12 weeks to include NSAID's and/or analgesics, flexibility and strengthening excercises and activity modification.  Onset of symptoms was gradual starting 3 years ago with gradually worsening course since that time.The patient noted no past surgery on the right hip(s).  Patient currently rates pain in the right hip at 10 out of 10 with activity. Patient has night pain, worsening of pain with activity and weight bearing, trendelenberg gait, pain that interfers with activities of daily living and pain with passive range of motion. Patient has evidence of joint space narrowing by imaging studies. This condition presents safety issues increasing the risk of falls.  There is no current active infection.  Patient Active Problem List   Diagnosis Date Noted  . Palpitations 01/29/2018  . SCC (squamous cell carcinoma) 01/28/2018  . Solitary pulmonary nodule 10/03/2016  . Cough 09/29/2016  . Seborrheic keratosis 02/26/2014  . Rotator cuff tear arthropathy of right shoulder 01/10/2014  . 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 S/P total knee arthroplasty 10/05/2011  . 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 OA (osteoarthritis) of knee 03/01/2011  . CKD (chronic kidney disease) 05/13/2010   Past Medical History:  Diagnosis Date  . Arthritis   . CAD (coronary artery disease)   . Chronic kidney disease   . Complication of anesthesia    reports " i lose my blood pressure after anesthesia; the drops usually happens in recovery"   . Depression   . Hand pain    currently being evaluated by rheumatology for dx   . Hematuria   . Hypertension   . Rapid heart  beat    rpeorts  " 2 months ago i had a rapid heart beat that lasted 45 min to an hour, ive experience this twice in the last t10 years but only seconds long" " i was evaluated by cardiology Dr Landry Mellow who did a 30 day monitor and ECHO and said i have occasional [PATS] ? , denies chest pain nor LOC  during these episodes; HR today is WDL   . Sleep apnea    CPAP     Past Surgical History:  Procedure Laterality Date  . CATARACT EXTRACTION, BILATERAL    . HIP SURGERY Left 2012  . JOINT REPLACEMENT    . REPLACEMENT TOTAL KNEE Right    2001,2006,2009,2013  . REPLACEMENT TOTAL KNEE Left 2011    No current facility-administered medications for this encounter.    Current Outpatient Medications  Medication Sig Dispense Refill Last Dose  . acetaminophen (TYLENOL) 500 MG tablet Take 1,000 mg by mouth 2 (two) times daily.    Taking  . aspirin EC 81 MG tablet Take 81 mg by mouth at bedtime.     Marland Kitchen atorvastatin (LIPITOR) 20 MG tablet Take 20 mg by mouth at bedtime.  0   . buPROPion (WELLBUTRIN SR) 200 MG 12 hr tablet TAKE 1 TABLET TWICE A DAY (Patient taking differently: Take 400 mg by mouth daily. ) 180 tablet 2 Taking  . fexofenadine (ALLEGRA) 180 MG tablet Take 180 mg by mouth at bedtime.     . fluticasone (FLONASE) 50 MCG/ACT nasal spray Place 2 sprays into both nostrils daily. (  Patient taking differently: Place 1 spray into both nostrils at bedtime. ) 16 g 6 Taking  . losartan (COZAAR) 100 MG tablet TAKE 1 TABLET BY MOUTH EVERY DAY (Patient taking differently: Take 100 mg by mouth at bedtime. ) 90 tablet 0 Taking  . pantoprazole (PROTONIX) 40 MG tablet Take 1 tablet (40 mg total) by mouth daily. Take 30-60 min before first meal of the day (Patient not taking: Reported on 08/02/2018) 30 tablet 2 Not Taking at Unknown time  . ranitidine (ZANTAC) 150 MG tablet Take 1 tablet (150 mg total) by mouth at bedtime. (Patient not taking: Reported on 08/02/2018) 30 tablet 1 Not Taking at Unknown time    Allergies  Allergen Reactions  . Dilaudid [Hydromorphone Hcl] Other (See Comments)    Suicidal    Social History   Tobacco Use  . Smoking status: Former Smoker    Types: Cigarettes    Last attempt to quit: 1971    Years since quitting: 48.9  . Smokeless tobacco: Never Used  Substance Use Topics  . Alcohol use: Yes    Alcohol/week: 3.0 standard drinks    Types: 3 Cans of beer per week    Comment: max 3 beers/week    Family History  Problem Relation Age of Onset  . Cancer Mother        throat  . Hypertension Mother   . Heart disease Mother   . Emphysema Mother   . Hypertension Father   . Cancer Father        Brain  . Hypertension Sister   . Hematuria Sister   . Hematuria Brother      Review of Systems  Constitutional: Negative.   HENT: Positive for nosebleeds and tinnitus.   Eyes: Negative.   Respiratory: Negative.   Cardiovascular:       HTN and irregular heart beat  Gastrointestinal: Negative.   Genitourinary: Positive for hematuria and urgency.  Musculoskeletal: Positive for joint pain.  Skin: Negative.   Neurological: Negative.   Endo/Heme/Allergies: Negative.   Psychiatric/Behavioral: Positive for depression.    Objective:  Physical Exam  Constitutional: He is oriented to person, place, and time. He appears well-developed and well-nourished.  HENT:  Head: Normocephalic and atraumatic.  Eyes: Pupils are equal, round, and reactive to light.  Neck: Normal range of motion. Neck supple.  Cardiovascular: Intact distal pulses.  Respiratory: Effort normal.  Musculoskeletal: He exhibits tenderness.  the patient's right hip has very little motion.  He does have increased pain with attempts at internal rotation.  Also increased pain with hip flexion.  Patient's left hip also has stiffness.  No pain with motion in the left hip.  His calves are soft and nontender.  He is neurovascularly intact distally.  Neurological: He is alert and oriented to person, place,  and time.  Skin: Skin is warm and dry.  Psychiatric: He has a normal mood and affect. His behavior is normal. Judgment and thought content normal.    Vital signs in last 24 hours:    Labs:   Estimated body mass index is 29.9 kg/m as calculated from the following:   Height as of 04/18/18: 5' 10.5" (1.791 m).   Weight as of 04/18/18: 95.9 kg.   Imaging Review Plain radiographs demonstrate near end-stage arthritis of the right hip.    Preoperative templating of the joint replacement has been completed, documented, and submitted to the Operating Room personnel in order to optimize intra-operative equipment management.     Assessment/Plan:  End stage arthritis, right hip(s)  The patient history, physical examination, clinical judgement of the provider and imaging studies are consistent with end stage degenerative joint disease of the right hip(s) and total hip arthroplasty is deemed medically necessary. The treatment options including medical management, injection therapy, arthroscopy and arthroplasty were discussed at length. The risks and benefits of total hip arthroplasty were presented and reviewed. The risks due to aseptic loosening, infection, stiffness, dislocation/subluxation,  thromboembolic complications and other imponderables were discussed.  The patient acknowledged the explanation, agreed to proceed with the plan and consent was signed. Patient is being admitted for inpatient treatment for surgery, pain control, PT, OT, prophylactic antibiotics, VTE prophylaxis, progressive ambulation and ADL's and discharge planning.The patient is planning to be discharged home with home health services

## 2018-08-13 MED ORDER — BUPIVACAINE LIPOSOME 1.3 % IJ SUSP
10.0000 mL | Freq: Once | INTRAMUSCULAR | Status: DC
  Filled 2018-08-13: qty 20

## 2018-08-13 MED ORDER — TRANEXAMIC ACID 1000 MG/10ML IV SOLN
2000.0000 mg | INTRAVENOUS | Status: DC
  Filled 2018-08-13: qty 20

## 2018-08-14 ENCOUNTER — Inpatient Hospital Stay (HOSPITAL_COMMUNITY): Payer: Medicare Other | Admitting: Anesthesiology

## 2018-08-14 ENCOUNTER — Inpatient Hospital Stay (HOSPITAL_COMMUNITY): Payer: Medicare Other

## 2018-08-14 ENCOUNTER — Other Ambulatory Visit: Payer: Self-pay

## 2018-08-14 ENCOUNTER — Encounter (HOSPITAL_COMMUNITY): Payer: Self-pay

## 2018-08-14 ENCOUNTER — Inpatient Hospital Stay (HOSPITAL_COMMUNITY)
Admission: RE | Admit: 2018-08-14 | Discharge: 2018-08-15 | DRG: 470 | Disposition: A | Discharge status: Home Health | Payer: Medicare Other | Attending: Orthopedic Surgery | Admitting: Orthopedic Surgery

## 2018-08-14 ENCOUNTER — Encounter (HOSPITAL_COMMUNITY)
Admission: RE | Disposition: A | Discharge status: Home / Self Care | Payer: Self-pay | Source: Home / Self Care | Attending: Orthopedic Surgery

## 2018-08-14 DIAGNOSIS — G4730 Sleep apnea, unspecified: Secondary | ICD-10-CM | POA: Diagnosis present

## 2018-08-14 DIAGNOSIS — I2510 Atherosclerotic heart disease of native coronary artery without angina pectoris: Secondary | ICD-10-CM | POA: Diagnosis present

## 2018-08-14 DIAGNOSIS — D62 Acute posthemorrhagic anemia: Secondary | ICD-10-CM | POA: Diagnosis not present

## 2018-08-14 DIAGNOSIS — M1611 Unilateral primary osteoarthritis, right hip: Principal | ICD-10-CM | POA: Diagnosis present

## 2018-08-14 DIAGNOSIS — Z7982 Long term (current) use of aspirin: Secondary | ICD-10-CM

## 2018-08-14 DIAGNOSIS — Z01818 Encounter for other preprocedural examination: Secondary | ICD-10-CM

## 2018-08-14 DIAGNOSIS — Z825 Family history of asthma and other chronic lower respiratory diseases: Secondary | ICD-10-CM

## 2018-08-14 DIAGNOSIS — Z96642 Presence of left artificial hip joint: Secondary | ICD-10-CM | POA: Diagnosis present

## 2018-08-14 DIAGNOSIS — Z96641 Presence of right artificial hip joint: Secondary | ICD-10-CM | POA: Diagnosis not present

## 2018-08-14 DIAGNOSIS — Z471 Aftercare following joint replacement surgery: Secondary | ICD-10-CM | POA: Diagnosis not present

## 2018-08-14 DIAGNOSIS — Z8249 Family history of ischemic heart disease and other diseases of the circulatory system: Secondary | ICD-10-CM

## 2018-08-14 DIAGNOSIS — Z419 Encounter for procedure for purposes other than remedying health state, unspecified: Secondary | ICD-10-CM

## 2018-08-14 DIAGNOSIS — I10 Essential (primary) hypertension: Secondary | ICD-10-CM | POA: Diagnosis not present

## 2018-08-14 DIAGNOSIS — Z96653 Presence of artificial knee joint, bilateral: Secondary | ICD-10-CM | POA: Diagnosis present

## 2018-08-14 HISTORY — PX: TOTAL HIP ARTHROPLASTY: SHX124

## 2018-08-14 LAB — TYPE AND SCREEN
ABO/RH(D): O POS
Antibody Screen: NEGATIVE

## 2018-08-14 SURGERY — ARTHROPLASTY, HIP, TOTAL, ANTERIOR APPROACH
Anesthesia: Spinal | Site: Hip | Laterality: Right

## 2018-08-14 MED ORDER — BUPIVACAINE IN DEXTROSE 0.75-8.25 % IT SOLN
INTRATHECAL | Status: DC | PRN
  Administered 2018-08-14: 1.8 mL via INTRATHECAL

## 2018-08-14 MED ORDER — LOSARTAN POTASSIUM 50 MG PO TABS
100.0000 mg | ORAL_TABLET | Freq: Every day | ORAL | Status: DC
  Administered 2018-08-14: 100 mg via ORAL
  Filled 2018-08-14: qty 2

## 2018-08-14 MED ORDER — OXYCODONE-ACETAMINOPHEN 5-325 MG PO TABS: 1.0000 | ORAL_TABLET | ORAL | 0 refills | Status: DC | PRN

## 2018-08-14 MED ORDER — ONDANSETRON HCL 4 MG/2ML IJ SOLN: 4.0000 mg | Freq: Four times a day (QID) | INTRAMUSCULAR | Status: DC | PRN

## 2018-08-14 MED ORDER — ASPIRIN EC 81 MG PO TBEC: 81.0000 mg | DELAYED_RELEASE_TABLET | Freq: Two times a day (BID) | ORAL | 0 refills | Status: DC

## 2018-08-14 MED ORDER — ALBUMIN HUMAN 5 % IV SOLN
INTRAVENOUS | Status: DC | PRN
  Administered 2018-08-14 (×2): via INTRAVENOUS

## 2018-08-14 MED ORDER — KCL IN DEXTROSE-NACL 20-5-0.45 MEQ/L-%-% IV SOLN
INTRAVENOUS | Status: DC
  Administered 2018-08-14: 18:00:00 via INTRAVENOUS
  Filled 2018-08-14 (×2): qty 1000

## 2018-08-14 MED ORDER — PANTOPRAZOLE SODIUM 40 MG PO TBEC
40.0000 mg | DELAYED_RELEASE_TABLET | Freq: Every day | ORAL | Status: DC
  Administered 2018-08-14 – 2018-08-15 (×2): 40 mg via ORAL
  Filled 2018-08-14 (×2): qty 1

## 2018-08-14 MED ORDER — DIPHENHYDRAMINE HCL 12.5 MG/5ML PO ELIX: 12.5000 mg | ORAL_SOLUTION | ORAL | Status: DC | PRN

## 2018-08-14 MED ORDER — GABAPENTIN 300 MG PO CAPS
300.0000 mg | ORAL_CAPSULE | Freq: Three times a day (TID) | ORAL | Status: DC
  Administered 2018-08-14 – 2018-08-15 (×2): 300 mg via ORAL
  Filled 2018-08-14 (×2): qty 1

## 2018-08-14 MED ORDER — 0.9 % SODIUM CHLORIDE (POUR BTL) OPTIME
TOPICAL | Status: DC | PRN
  Administered 2018-08-14: 1000 mL

## 2018-08-14 MED ORDER — HYDROCODONE-ACETAMINOPHEN 5-325 MG PO TABS: 1.0000 | ORAL_TABLET | ORAL | Status: DC | PRN

## 2018-08-14 MED ORDER — FENTANYL CITRATE (PF) 100 MCG/2ML IJ SOLN
INTRAMUSCULAR | Status: DC | PRN
  Administered 2018-08-14: 100 ug via INTRAVENOUS

## 2018-08-14 MED ORDER — FENTANYL CITRATE (PF) 100 MCG/2ML IJ SOLN
INTRAMUSCULAR | Status: AC
  Filled 2018-08-14: qty 2

## 2018-08-14 MED ORDER — LORATADINE 10 MG PO TABS
10.0000 mg | ORAL_TABLET | Freq: Every day | ORAL | Status: DC
  Filled 2018-08-14: qty 1

## 2018-08-14 MED ORDER — ONDANSETRON HCL 4 MG/2ML IJ SOLN
INTRAMUSCULAR | Status: DC | PRN
  Administered 2018-08-14: 4 mg via INTRAVENOUS

## 2018-08-14 MED ORDER — FENTANYL CITRATE (PF) 100 MCG/2ML IJ SOLN
25.0000 ug | INTRAMUSCULAR | Status: DC | PRN
  Administered 2018-08-14: 50 ug via INTRAVENOUS
  Administered 2018-08-14 (×2): 25 ug via INTRAVENOUS

## 2018-08-14 MED ORDER — TRANEXAMIC ACID 1000 MG/10ML IV SOLN
INTRAVENOUS | Status: DC | PRN
  Administered 2018-08-14: 2000 mg via TOPICAL

## 2018-08-14 MED ORDER — SODIUM CHLORIDE 0.9% FLUSH
INTRAVENOUS | Status: DC | PRN
  Administered 2018-08-14: 50 mL

## 2018-08-14 MED ORDER — SODIUM CHLORIDE (PF) 0.9 % IJ SOLN
INTRAMUSCULAR | Status: AC
  Filled 2018-08-14: qty 10

## 2018-08-14 MED ORDER — BUPIVACAINE LIPOSOME 1.3 % IJ SUSP
INTRAMUSCULAR | Status: DC | PRN
  Administered 2018-08-14: 20 mL

## 2018-08-14 MED ORDER — BISACODYL 5 MG PO TBEC: 5.0000 mg | DELAYED_RELEASE_TABLET | Freq: Every day | ORAL | Status: DC | PRN

## 2018-08-14 MED ORDER — LACTATED RINGERS IV SOLN
INTRAVENOUS | Status: DC
  Administered 2018-08-14 (×3): via INTRAVENOUS

## 2018-08-14 MED ORDER — ACETAMINOPHEN 325 MG PO TABS: 325.0000 mg | ORAL_TABLET | Freq: Four times a day (QID) | ORAL | Status: DC | PRN

## 2018-08-14 MED ORDER — TIZANIDINE HCL 2 MG PO TABS: 2.0000 mg | ORAL_TABLET | Freq: Four times a day (QID) | ORAL | 0 refills | Status: DC | PRN

## 2018-08-14 MED ORDER — CEFAZOLIN SODIUM-DEXTROSE 2-4 GM/100ML-% IV SOLN
INTRAVENOUS | Status: AC
  Filled 2018-08-14: qty 100

## 2018-08-14 MED ORDER — BUPIVACAINE-EPINEPHRINE (PF) 0.25% -1:200000 IJ SOLN
INTRAMUSCULAR | Status: AC
  Filled 2018-08-14: qty 30

## 2018-08-14 MED ORDER — PROPOFOL 10 MG/ML IV BOLUS
INTRAVENOUS | Status: AC
  Filled 2018-08-14: qty 60

## 2018-08-14 MED ORDER — ONDANSETRON HCL 4 MG PO TABS: 4.0000 mg | ORAL_TABLET | Freq: Four times a day (QID) | ORAL | Status: DC | PRN

## 2018-08-14 MED ORDER — POLYETHYLENE GLYCOL 3350 17 G PO PACK: 17.0000 g | PACK | Freq: Every day | ORAL | Status: DC | PRN

## 2018-08-14 MED ORDER — PROPOFOL 10 MG/ML IV BOLUS
INTRAVENOUS | Status: AC
  Filled 2018-08-14: qty 20

## 2018-08-14 MED ORDER — PROPOFOL 500 MG/50ML IV EMUL
INTRAVENOUS | Status: DC | PRN
  Administered 2018-08-14: 75 ug/kg/min via INTRAVENOUS

## 2018-08-14 MED ORDER — BUPROPION HCL ER (SR) 100 MG PO TB12
200.0000 mg | ORAL_TABLET | Freq: Every day | ORAL | Status: DC
  Administered 2018-08-15: 200 mg via ORAL
  Filled 2018-08-14: qty 2

## 2018-08-14 MED ORDER — TRANEXAMIC ACID-NACL 1000-0.7 MG/100ML-% IV SOLN
INTRAVENOUS | Status: AC
  Filled 2018-08-14: qty 100

## 2018-08-14 MED ORDER — TRAMADOL HCL 50 MG PO TABS
50.0000 mg | ORAL_TABLET | Freq: Four times a day (QID) | ORAL | Status: DC
  Administered 2018-08-14 – 2018-08-15 (×2): 50 mg via ORAL
  Filled 2018-08-14 (×3): qty 1

## 2018-08-14 MED ORDER — ALUM & MAG HYDROXIDE-SIMETH 200-200-20 MG/5ML PO SUSP: 30.0000 mL | ORAL | Status: DC | PRN

## 2018-08-14 MED ORDER — TRANEXAMIC ACID-NACL 1000-0.7 MG/100ML-% IV SOLN
1000.0000 mg | INTRAVENOUS | Status: AC
  Administered 2018-08-14: 1000 mg via INTRAVENOUS

## 2018-08-14 MED ORDER — METHOCARBAMOL 500 MG PO TABS
500.0000 mg | ORAL_TABLET | Freq: Four times a day (QID) | ORAL | Status: DC | PRN
  Administered 2018-08-15: 500 mg via ORAL
  Filled 2018-08-14: qty 1

## 2018-08-14 MED ORDER — METHOCARBAMOL 500 MG IVPB - SIMPLE MED
INTRAVENOUS | Status: AC
  Filled 2018-08-14: qty 50

## 2018-08-14 MED ORDER — METOCLOPRAMIDE HCL 5 MG/ML IJ SOLN: 5.0000 mg | Freq: Three times a day (TID) | INTRAMUSCULAR | Status: DC | PRN

## 2018-08-14 MED ORDER — MIDAZOLAM HCL 2 MG/2ML IJ SOLN
INTRAMUSCULAR | Status: AC
  Filled 2018-08-14: qty 2

## 2018-08-14 MED ORDER — MIDAZOLAM HCL 5 MG/5ML IJ SOLN
INTRAMUSCULAR | Status: DC | PRN
  Administered 2018-08-14 (×2): 1 mg via INTRAVENOUS

## 2018-08-14 MED ORDER — CHLORHEXIDINE GLUCONATE 4 % EX LIQD: 60.0000 mL | Freq: Once | CUTANEOUS | Status: DC

## 2018-08-14 MED ORDER — MENTHOL 3 MG MT LOZG: 1.0000 | LOZENGE | OROMUCOSAL | Status: DC | PRN

## 2018-08-14 MED ORDER — DOCUSATE SODIUM 100 MG PO CAPS
100.0000 mg | ORAL_CAPSULE | Freq: Two times a day (BID) | ORAL | Status: DC
  Administered 2018-08-14 – 2018-08-15 (×2): 100 mg via ORAL
  Filled 2018-08-14 (×2): qty 1

## 2018-08-14 MED ORDER — DEXAMETHASONE SODIUM PHOSPHATE 10 MG/ML IJ SOLN
10.0000 mg | Freq: Once | INTRAMUSCULAR | Status: AC
  Administered 2018-08-15: 10 mg via INTRAVENOUS
  Filled 2018-08-14: qty 1

## 2018-08-14 MED ORDER — BUPIVACAINE-EPINEPHRINE 0.25% -1:200000 IJ SOLN
INTRAMUSCULAR | Status: DC | PRN
  Administered 2018-08-14: 30 mL

## 2018-08-14 MED ORDER — METHOCARBAMOL 500 MG IVPB - SIMPLE MED
500.0000 mg | Freq: Four times a day (QID) | INTRAVENOUS | Status: DC | PRN
  Administered 2018-08-14: 500 mg via INTRAVENOUS
  Filled 2018-08-14: qty 50

## 2018-08-14 MED ORDER — PROMETHAZINE HCL 25 MG/ML IJ SOLN: 6.2500 mg | INTRAMUSCULAR | Status: DC | PRN

## 2018-08-14 MED ORDER — TRANEXAMIC ACID-NACL 1000-0.7 MG/100ML-% IV SOLN
1000.0000 mg | Freq: Once | INTRAVENOUS | Status: AC
  Administered 2018-08-14: 1000 mg via INTRAVENOUS
  Filled 2018-08-14: qty 100

## 2018-08-14 MED ORDER — DEXAMETHASONE SODIUM PHOSPHATE 10 MG/ML IJ SOLN
INTRAMUSCULAR | Status: DC | PRN
  Administered 2018-08-14: 10 mg via INTRAVENOUS

## 2018-08-14 MED ORDER — FLEET ENEMA 7-19 GM/118ML RE ENEM: 1.0000 | ENEMA | Freq: Once | RECTAL | Status: DC | PRN

## 2018-08-14 MED ORDER — HYDROCODONE-ACETAMINOPHEN 7.5-325 MG PO TABS: 1.0000 | ORAL_TABLET | ORAL | Status: DC | PRN

## 2018-08-14 MED ORDER — DEXAMETHASONE SODIUM PHOSPHATE 10 MG/ML IJ SOLN
INTRAMUSCULAR | Status: AC
  Filled 2018-08-14: qty 1

## 2018-08-14 MED ORDER — BUPIVACAINE HCL (PF) 0.5 % IJ SOLN
INTRAMUSCULAR | Status: AC
  Filled 2018-08-14: qty 30

## 2018-08-14 MED ORDER — BUPIVACAINE LIPOSOME 1.3 % IJ SUSP: 20.0000 mL | Freq: Once | INTRAMUSCULAR | Status: DC

## 2018-08-14 MED ORDER — OXYCODONE-ACETAMINOPHEN 5-325 MG PO TABS
1.0000 | ORAL_TABLET | ORAL | Status: DC | PRN
  Administered 2018-08-15: 2 via ORAL
  Filled 2018-08-14: qty 2

## 2018-08-14 MED ORDER — FLUTICASONE PROPIONATE 50 MCG/ACT NA SUSP
1.0000 | Freq: Every day | NASAL | Status: DC
  Filled 2018-08-14: qty 16

## 2018-08-14 MED ORDER — SODIUM CHLORIDE (PF) 0.9 % IJ SOLN
INTRAMUSCULAR | Status: AC
  Filled 2018-08-14: qty 50

## 2018-08-14 MED ORDER — PHENOL 1.4 % MT LIQD
1.0000 | OROMUCOSAL | Status: DC | PRN
  Filled 2018-08-14: qty 177

## 2018-08-14 MED ORDER — ASPIRIN 81 MG PO CHEW
81.0000 mg | CHEWABLE_TABLET | Freq: Two times a day (BID) | ORAL | Status: DC
  Administered 2018-08-14 – 2018-08-15 (×2): 81 mg via ORAL
  Filled 2018-08-14 (×2): qty 1

## 2018-08-14 MED ORDER — ONDANSETRON HCL 4 MG/2ML IJ SOLN
INTRAMUSCULAR | Status: AC
  Filled 2018-08-14: qty 2

## 2018-08-14 MED ORDER — SODIUM CHLORIDE 0.9 % IV SOLN
INTRAVENOUS | Status: DC | PRN
  Administered 2018-08-14: 25 ug/min via INTRAVENOUS

## 2018-08-14 MED ORDER — TRANEXAMIC ACID-NACL 1000-0.7 MG/100ML-% IV SOLN: 1000.0000 mg | INTRAVENOUS | Status: DC

## 2018-08-14 MED ORDER — CEFAZOLIN SODIUM-DEXTROSE 2-4 GM/100ML-% IV SOLN
2.0000 g | INTRAVENOUS | Status: AC
  Administered 2018-08-14: 2 g via INTRAVENOUS

## 2018-08-14 MED ORDER — METOCLOPRAMIDE HCL 5 MG PO TABS: 5.0000 mg | ORAL_TABLET | Freq: Three times a day (TID) | ORAL | Status: DC | PRN

## 2018-08-14 SURGICAL SUPPLY — 44 items
BAG DECANTER FOR FLEXI CONT (MISCELLANEOUS) ×3 IMPLANT
BLADE SAW SGTL 18X1.27X75 (BLADE) ×2 IMPLANT
BLADE SAW SGTL 18X1.27X75MM (BLADE) ×1
COVER PERINEAL POST (MISCELLANEOUS) ×3 IMPLANT
COVER SURGICAL LIGHT HANDLE (MISCELLANEOUS) ×3 IMPLANT
COVER WAND RF STERILE (DRAPES) ×3 IMPLANT
CUP ACETBLR 52 OD 100 SERIES (Hips) ×3 IMPLANT
DECANTER SPIKE VIAL GLASS SM (MISCELLANEOUS) ×6 IMPLANT
DRAPE STERI IOBAN 125X83 (DRAPES) ×3 IMPLANT
DRAPE U-SHAPE 47X51 STRL (DRAPES) ×6 IMPLANT
DRESSING AQUACEL AG SP 3.5X10 (GAUZE/BANDAGES/DRESSINGS) ×1 IMPLANT
DRSG AQUACEL AG ADV 3.5X10 (GAUZE/BANDAGES/DRESSINGS) ×3 IMPLANT
DRSG AQUACEL AG SP 3.5X10 (GAUZE/BANDAGES/DRESSINGS) ×3
DURAPREP 26ML APPLICATOR (WOUND CARE) ×3 IMPLANT
ELECT REM PT RETURN 15FT ADLT (MISCELLANEOUS) ×3 IMPLANT
ELIMINATOR HOLE APEX DEPUY (Hips) ×3 IMPLANT
GLOVE BIO SURGEON STRL SZ7.5 (GLOVE) ×3 IMPLANT
GLOVE BIO SURGEON STRL SZ8.5 (GLOVE) ×3 IMPLANT
GLOVE BIOGEL PI IND STRL 8 (GLOVE) ×1 IMPLANT
GLOVE BIOGEL PI IND STRL 9 (GLOVE) ×1 IMPLANT
GLOVE BIOGEL PI INDICATOR 8 (GLOVE) ×2
GLOVE BIOGEL PI INDICATOR 9 (GLOVE) ×2
GOWN STRL REUS W/ TWL LRG LVL3 (GOWN DISPOSABLE) ×1 IMPLANT
GOWN STRL REUS W/TWL LRG LVL3 (GOWN DISPOSABLE) ×2
GOWN STRL REUS W/TWL XL LVL3 (GOWN DISPOSABLE) ×3 IMPLANT
HEAD CERAMIC DELTA 36 PLUS 1.5 (Hips) ×3 IMPLANT
LINER NEUTRAL 52X36MM PLUS 4 (Liner) ×3 IMPLANT
MANIFOLD NEPTUNE II (INSTRUMENTS) ×3 IMPLANT
NEEDLE HYPO 21X1.5 SAFETY (NEEDLE) ×6 IMPLANT
NS IRRIG 1000ML POUR BTL (IV SOLUTION) ×3 IMPLANT
PACK ANTERIOR HIP CUSTOM (KITS) ×3 IMPLANT
STEM FEM ACTIS HIGH SZ8 (Stem) ×3 IMPLANT
SUT ETHIBOND NAB CT1 #1 30IN (SUTURE) ×3 IMPLANT
SUT VIC AB 0 CT1 27 (SUTURE) ×2
SUT VIC AB 0 CT1 27XBRD ANBCTR (SUTURE) ×1 IMPLANT
SUT VIC AB 1 CTX 36 (SUTURE) ×2
SUT VIC AB 1 CTX36XBRD ANBCTR (SUTURE) ×1 IMPLANT
SUT VIC AB 2-0 CT1 27 (SUTURE) ×2
SUT VIC AB 2-0 CT1 TAPERPNT 27 (SUTURE) ×1 IMPLANT
SUT VIC AB 3-0 CT1 27 (SUTURE) ×2
SUT VIC AB 3-0 CT1 TAPERPNT 27 (SUTURE) ×1 IMPLANT
SYR CONTROL 10ML LL (SYRINGE) ×9 IMPLANT
TRAY FOLEY MTR SLVR 16FR STAT (SET/KITS/TRAYS/PACK) ×3 IMPLANT
YANKAUER SUCT BULB TIP 10FT TU (MISCELLANEOUS) ×3 IMPLANT

## 2018-08-14 NOTE — Anesthesia Preprocedure Evaluation (Addendum)
Anesthesia Evaluation  Patient identified by MRN, date of birth, ID band Patient awake    Reviewed: Allergy & Precautions, NPO status , Patient's Chart, lab work & pertinent test results  History of Anesthesia Complications Negative for: history of anesthetic complications  Airway Mallampati: II  TM Distance: >3 FB Neck ROM: Full    Dental no notable dental hx. (+) Dental Advisory Given   Pulmonary neg pulmonary ROS, sleep apnea and Continuous Positive Airway Pressure Ventilation , former smoker,    Pulmonary exam normal        Cardiovascular hypertension, Pt. on medications + CAD  Normal cardiovascular exam  Echo 03/2018: Normal EF, Mild AI, O/W nl   Neuro/Psych PSYCHIATRIC DISORDERS Depression negative neurological ROS  negative psych ROS   GI/Hepatic negative GI ROS, Neg liver ROS,   Endo/Other  negative endocrine ROS  Renal/GU Renal InsufficiencyRenal disease  negative genitourinary   Musculoskeletal negative musculoskeletal ROS (+) Arthritis ,   Abdominal   Peds negative pediatric ROS (+)  Hematology negative hematology ROS (+)   Anesthesia Other Findings   Reproductive/Obstetrics negative OB ROS                            Anesthesia Physical Anesthesia Plan  ASA: III  Anesthesia Plan: Spinal   Post-op Pain Management:    Induction:   PONV Risk Score and Plan: 2 and Ondansetron and Propofol infusion  Airway Management Planned: Natural Airway and Simple Face Mask  Additional Equipment:   Intra-op Plan:   Post-operative Plan:   Informed Consent: I have reviewed the patients History and Physical, chart, labs and discussed the procedure including the risks, benefits and alternatives for the proposed anesthesia with the patient or authorized representative who has indicated his/her understanding and acceptance.   Dental advisory given  Plan Discussed with: CRNA and  Anesthesiologist  Anesthesia Plan Comments:         Anesthesia Quick Evaluation

## 2018-08-14 NOTE — Op Note (Signed)
OPERATIVE REPORT    DATE OF PROCEDURE:  08/14/2018       PREOPERATIVE DIAGNOSIS:  RIGHT HIP OSTEOARTHRITIS                                                          POSTOPERATIVE DIAGNOSIS:  RIGHT HIP OSTEOARTHRITIS                                                           PROCEDURE: Anterior R total hip arthroplasty using a 52 mm DePuy Pinnacle  Cup, [employer], 0-degree polyethylene liner, a +1 mm x 41mm ceramic head, a 8 hi Depuy Actis stem   SURGEON: Kerin Salen    ASSISTANT:   Kerry Hough. [employer]  (present throughout entire procedure and necessary for timely completion of the procedure)   ANESTHESIA: Spinal BLOOD LOSS: 300 cc FLUID REPLACEMENT: 1500 cc crystalloid Antibiotic: 2gm ancef Tranexamic Acid: 1gm IV, 2gm Topical Exparel: 266mg  COMPLICATIONS: none    INDICATIONS FOR PROCEDURE: A 72 y.o. year-old With  RIGHT HIP OSTEOARTHRITIS   for 5 years, x-rays show bone-on-bone arthritic changes, and osteophytes. Despite conservative measures with observation, anti-inflammatory medicine, narcotics, use of a cane, has severe unremitting pain and can ambulate only a few blocks before resting. Patient desires elective R total hip arthroplasty to decrease pain and increase function. The risks, benefits, and alternatives were discussed at length including but not limited to the risks of infection, bleeding, nerve injury, stiffness, blood clots, the need for revision surgery, cardiopulmonary complications, among others, and they were willing to proceed. Questions answered      PROCEDURE IN DETAIL: The patient was identified by armband,   received preoperative IV antibiotics in the holding area at [facility], taken to the operating room , appropriate anesthetic monitors   were attached and  anesthesia was induced with the patient on the gurney. The HANA boots were applied to the feet and the patient  was transferred to the HANA table with a peroneal post and support  underneath the non-operative leg, which was locked in 2 lb traction. Theoperative lower extremity was then prepped and draped in the usual sterile fashion from just above the iliac crest to the knee. And a timeout procedure was performed. We then made a 13 cm incision along the interval at the leading edge of the tensor fascia lata of starting at 2 cm lateral to the ASIS. Small bleeders in the skin and subcutaneous tissue identified and cauterized we dissected down to the fascia and made an incision in the fascia allowing [name] to elevate the fascia of the tensor muscle and exploited the interval between the rectus and the tensor fascia lata. A Cobra retractor was then placed along the superior neck of the femur. A cerebellar retractor was used to expose the interval between the tensor fascia lata and the rectus femoris. .  We identified and cauterized the ascending branch of the anterior circumflex artery. A second Cobra retractor along the inferior neck of the femur. A small Hohmann retractor was placed underneath the origin of the rectus femoris, giving [name]  good medial exposure. Using Ronguers fatty tissue was removed from in front of the anterior capsule. The capsule was then incised, starting out at the superior anterior aspect of the acetabulum going laterally along the anterior neck. The capsule was then teed along the neck superiorly and inferiorly. Electrocautery was used to release capsule from the anterior and medial neck of the femur to allow external rotation. Cobra retractors were then placed along the inferior and superior neck allowing [name] to perform a standard neck cut and removed the femoral head with a power corkscrew. We then placed a medium bent homan retractor in the cotyloid notch and posteriorly along the acetabular rim a narrow Cobra retractor. Exposed labral tissue was then removed with the electrocautery. We then sequentially reamed up to a 51 mm basket reamer obtaining good coverage in all  quadrants, verified by C-arm imaging. Under C-arm control we then hammered into place a 52 mm Pinnacle cup in 45 of abduction and 15 of anteversion. The cup seated nicely and required no supplemental screws. We then placed a central hole Eliminator and a 0 polyethylene liner. The foot was then externally rotated to 130-140. The limb was extended and adducted delivering the proximal femur up into the wound. A medium curved Hohmann retractor was placed over the greater trochanter and a long Homan retractor along the posterior femoral neck completing the exposure. We then performed releases superiorly and and inferiorly of the capsule going back to the pirformis fossa superiorly and to the lesser trochanter inferiorly. We then entered the proximal femur with the box cutting offset chisel followed by, a canal sounder, the chili pepper and broaching up to a 8 broach. This seated nicely and we reamed the calcar. A trial reduction was performed with a 1.5 mm 36 mm head.The limb lengths were excellent the hip was stable in 90 of external rotation. At this point the trial components removed and we hammered into place a # 8 hi  Offset Actis stem with Gryption coating. A +1 mm x 36 head was then hammered into place. The hip was reduced and final C-arm images obtained. The wound was thoroughly irrigated with normal saline solution. We repaired the ant capsule and the tensor fascia lot a with running 0 vicryl suture. the subcutaneous tissue was closed with 2-0 and 3-0 Vicryl suture followed by an Aquacil dressing. At this point the patient was awaken and transferred to hospital gurney without difficulty.   Kerin Salen 08/14/2018, 2:05 PM

## 2018-08-14 NOTE — Evaluation (Signed)
Physical Therapy Evaluation Patient Details Name: Joseph Maldonado. MRN: [id number] DOB: 1946/02/23 Today's Date: 08/14/2018   History of Present Illness  72 yo male s/p R DA-THA on 08/14/18. PMH includes SCC, R RTC arthropathy, R TKR with multiple revisions, L TKR, L THA, CKD, OA, depression   Clinical Impression   Pt presents with mild R hip pain with mobility, increased time and effort to perform mobility tasks, and decreased tolerance for ambulation. Pt to benefit from acute PT to address deficits. Pt ambulated 100 ft with RW with min guard assist. Pt educated on ankle pumps and heel slides to perform this evening to lessen stiffness and increase circulation. PT to progress mobility as tolerated, and will continue to follow acutely.      Follow Up Recommendations Follow surgeon's recommendation for DC plan and follow-up therapies;Supervision for mobility/OOB    Equipment Recommendations  None recommended by PT    Recommendations for Other Services       Precautions / Restrictions Precautions Precautions: Fall Restrictions Weight Bearing Restrictions: No Other Position/Activity Restrictions: WBAT       Mobility  Bed Mobility Overal bed mobility: Needs Assistance Bed Mobility: Supine to Sit;Sit to Supine     Supine to sit: Min guard;HOB elevated Sit to supine: Min guard;HOB elevated   General bed mobility comments: Min guard for safety. Increased time and effort.   Transfers Overall transfer level: Needs assistance Equipment used: Rolling walker (2 wheeled) Transfers: Sit to/from Stand Sit to Stand: Min guard;From elevated surface         General transfer comment: Min guard for safety. Verbal cuing for hand placement.   Ambulation/Gait Ambulation/Gait assistance: Min guard Gait Distance (Feet): 100 Feet Assistive device: Rolling walker (2 wheeled) Gait Pattern/deviations: Step-to pattern;Step-through pattern;Decreased stride length Gait velocity: slightly  decr    General Gait Details: Min guard for safety. Verbal cuing for sequencing, placement in RW, turning. Pt quickly progressing from step-to to step-through gait.   Stairs            Wheelchair Mobility    Modified Rankin (Stroke Patients Only)       Balance Overall balance assessment: Mild deficits observed, not formally tested                                           Pertinent Vitals/Pain Pain Assessment: 0-10 Pain Score: 3  Pain Location: R hip, with mobility  Pain Descriptors / Indicators: Aching;Sore Pain Intervention(s): Limited activity within patient's tolerance;Repositioned;Monitored during session    [place] expects to be discharged to:: Private residence Living Arrangements: Spouse/significant other Available Help at Discharge: Family;Available PRN/intermittently Type of Home: House Home Access: Stairs to enter Entrance Stairs-Rails: None Entrance Stairs-Number of Steps: 1 Home Layout: Two level;Able to live on main level with bedroom/bathroom;1/2 bath on main level Home Equipment: Toilet riser;Walker - 2 wheels;Crutches;Cane - single point      Prior Function Level of Independence: Independent               Hand Dominance   Dominant Hand: Right    Extremity/Trunk Assessment   Upper Extremity Assessment Upper Extremity Assessment: Overall WFL for tasks assessed    Lower Extremity Assessment Lower Extremity Assessment: Overall WFL for tasks assessed    Cervical / Trunk Assessment Cervical / Trunk Assessment: Normal  Communication   Communication: No difficulties  Cognition Arousal/Alertness: Awake/alert  Behavior During Therapy: WFL for tasks assessed/performed Overall Cognitive Status: Within Functional Limits for tasks assessed                                        General Comments      Exercises     Assessment/Plan    PT Assessment Patient needs continued PT services   PT Problem List Decreased strength;Pain;Decreased range of motion;Decreased activity tolerance;Decreased knowledge of use of DME;Decreased balance;Decreased mobility       PT Treatment Interventions DME instruction;Therapeutic activities;Gait training;Therapeutic exercise;Patient/family education;Stair training;Balance training;Functional mobility training    PT Goals (Current goals can be found in the Care Plan section)  Acute Rehab PT Goals Patient Stated Goal: go home  PT Goal Formulation: With patient Time For Goal Achievement: 08/21/18 Potential to Achieve Goals: Good    Frequency 7X/week   Barriers to discharge        Co-evaluation               AM-PAC PT "6 Clicks" Daily Activity  Outcome Measure Difficulty turning over in bed (including adjusting bedclothes, sheets and blankets)?: Unable Difficulty moving from lying on back to sitting on the side of the bed? : Unable Difficulty sitting down on and standing up from a chair with arms (e.g., wheelchair, bedside commode, etc,.)?: Unable Help needed moving to and from a bed to chair (including a wheelchair)?: A Little Help needed walking in hospital room?: A Little Help needed climbing 3-5 steps with a railing? : A Little 6 Click Score: 12    End of Session Equipment Utilized During Treatment: Gait belt Activity Tolerance: Patient tolerated treatment well Patient left: in bed;with bed alarm set;with call bell/phone within reach;with family/visitor present;with SCD's reapplied Nurse Communication: Mobility status PT Visit Diagnosis: Other abnormalities of gait and mobility (R26.89);Difficulty in walking, not elsewhere classified (R26.2)    Time: 4128-7867 PT Time Calculation (min) (ACUTE ONLY): 24 min   Charges:   PT Evaluation $PT Eval Low Complexity: 1 Low PT Treatments $Gait Training: 8-22 mins       Julien Girt, PT Acute Rehabilitation Services Pager [phone]  Office [phone]   Ashur Glatfelter D  Elonda Husky 08/14/2018, 7:48 PM

## 2018-08-14 NOTE — Anesthesia Procedure Notes (Signed)
Procedure Name: MAC Date/Time: 08/14/2018 12:14 PM Performed by: Lissa Morales, CRNA Pre-anesthesia Checklist: Patient identified, Emergency Drugs available, Suction available, Timeout performed and Patient being monitored Patient Re-evaluated:Patient Re-evaluated prior to induction Oxygen Delivery Method: Simple face mask Placement Confirmation: positive ETCO2

## 2018-08-14 NOTE — Anesthesia Procedure Notes (Signed)
Spinal  Patient location during procedure: OR End time: 08/14/2018 12:23 PM Staffing Resident/CRNA: Lissa Morales, CRNA Performed: resident/CRNA  Preanesthetic Checklist Completed: patient identified, site marked, surgical consent, pre-op evaluation, timeout performed, IV checked, risks and benefits discussed and monitors and equipment checked Spinal Block Patient position: sitting Prep: DuraPrep Patient monitoring: heart rate, continuous pulse ox and blood pressure Approach: midline Location: L3-4 Injection technique: single-shot Needle Needle type: Pencan  Needle gauge: 24 G Needle length: 9 cm Additional Notes Expiration date of kit checked and confirmed. Patient tolerated procedure well, without complications.

## 2018-08-14 NOTE — Anesthesia Postprocedure Evaluation (Signed)
Anesthesia Post Note  Patient: Joseph Maldonado.  Procedure(s) Performed: TOTAL HIP ARTHROPLASTY ANTERIOR APPROACH (Right Hip)     Patient location during evaluation: PACU Anesthesia Type: Spinal Level of consciousness: awake and alert Pain management: pain level controlled Vital Signs Assessment: post-procedure vital signs reviewed and stable Respiratory status: spontaneous breathing and respiratory function stable Cardiovascular status: blood pressure returned to baseline and stable Postop Assessment: spinal receding Anesthetic complications: no    Last Vitals:  Vitals:   08/14/18 1545 08/14/18 1600  BP: 111/73 121/72  Pulse: (!) 53 (!) 49  Resp: 13 12  Temp:    SpO2: 100% 100%    Last Pain:  Vitals:   08/14/18 1600  TempSrc:   PainSc: 0-No pain                 [name]

## 2018-08-14 NOTE — Transfer of Care (Signed)
Immediate Anesthesia Transfer of Care Note  Patient: Joseph Maldonado.  Procedure(s) Performed: TOTAL HIP ARTHROPLASTY ANTERIOR APPROACH (Right Hip)  Patient Location: PACU  Anesthesia Type:Spinal  Level of Consciousness: awake, alert , oriented and patient cooperative  Airway & Oxygen Therapy: Patient Spontanous Breathing and Patient connected to face mask oxygen  Post-op Assessment: Report given to RN and Post -op Vital signs reviewed and stable  Post vital signs: stable  Last Vitals:  Vitals Value Taken Time  BP 120/44 08/14/2018  2:45 PM  Temp    Pulse 56 08/14/2018  2:49 PM  Resp 15 08/14/2018  2:49 PM  SpO2 100 % 08/14/2018  2:49 PM  Vitals shown include unvalidated device data.  Last Pain:  Vitals:   08/14/18 1002  TempSrc:   PainSc: 0-No pain         Complications: No apparent anesthesia complications

## 2018-08-14 NOTE — Interval H&P Note (Signed)
History and Physical Interval Note:  08/14/2018 11:30 AM  Joseph Maldonado.  has presented today for surgery, with the diagnosis of RIGHT HIP OSTEOARTHRITIS  The various methods of treatment have been discussed with the patient and family. After consideration of risks, benefits and other options for treatment, the patient has consented to  Procedure(s): TOTAL HIP ARTHROPLASTY ANTERIOR APPROACH (Right) as a surgical intervention .  The patient's history has been reviewed, patient examined, no change in status, stable for surgery.  I have reviewed the patient's chart and labs.  Questions were answered to the patient's satisfaction.     Kerin Salen

## 2018-08-14 NOTE — Discharge Instructions (Signed)

## 2018-08-14 NOTE — Care Plan (Signed)
Ortho Bundle Case Management Note  Patient Details  Name: Joseph Maldonado. MRN: [id number] Date of Birth: 10-20-1945  Spoke with patient prior to surgery. Will discharge to home with family and HHPT. Has all needed equipment at home.                    DME Arranged:    DME Agency:     HH Arranged:  PT HH Agency:  [facility] ([facility])  Additional Comments: Please contact me with any questions of if this plan should need to change.  Ladell Heads,  [place] [facility]  [phone] 08/14/2018, 10:24 AM

## 2018-08-15 ENCOUNTER — Encounter (HOSPITAL_COMMUNITY): Payer: Self-pay | Admitting: Orthopedic Surgery

## 2018-08-15 LAB — CBC
HCT: 35.5 % — ABNORMAL LOW (ref 39.0–52.0)
Hemoglobin: 11.2 g/dL — ABNORMAL LOW (ref 13.0–17.0)
MCH: 30.5 pg (ref 26.0–34.0)
MCHC: 31.5 g/dL (ref 30.0–36.0)
MCV: 96.7 fL (ref 80.0–100.0)
Platelets: 182 10*3/uL (ref 150–400)
RBC: 3.67 MIL/uL — ABNORMAL LOW (ref 4.22–5.81)
RDW: 13.7 % (ref 11.5–15.5)
WBC: 18.1 10*3/uL — AB (ref 4.0–10.5)
nRBC: 0 % (ref 0.0–0.2)

## 2018-08-15 LAB — BASIC METABOLIC PANEL
Anion gap: 7 (ref 5–15)
BUN: 24 mg/dL — ABNORMAL HIGH (ref 8–23)
CO2: 24 mmol/L (ref 22–32)
CREATININE: 1.21 mg/dL (ref 0.61–1.24)
Calcium: 9.2 mg/dL (ref 8.9–10.3)
Chloride: 107 mmol/L (ref 98–111)
GFR calc non Af Amer: 58 mL/min — ABNORMAL LOW (ref >60)
Glucose, Bld: 147 mg/dL — ABNORMAL HIGH (ref 70–99)
Potassium: 4.5 mmol/L (ref 3.5–5.1)
SODIUM: 138 mmol/L (ref 135–145)

## 2018-08-15 LAB — GLUCOSE, CAPILLARY: GLUCOSE-CAPILLARY: 121 mg/dL — AB (ref 70–99)

## 2018-08-15 NOTE — Progress Notes (Signed)
PATIENT ID: Joseph Maldonado.  MRN: [id number]  DOB/AGE:  1946-04-24 / 72 y.o.  1 Day Post-Op Procedure(s) (LRB): TOTAL HIP ARTHROPLASTY ANTERIOR APPROACH (Right)    PROGRESS NOTE Subjective: Patient is alert, oriented, no Nausea, no Vomiting, yes passing gas, . Taking PO well. Denies SOB, Chest or Calf Pain. Using Incentive Spirometer, PAS in place. Ambulate 100' Patient reports pain as  1/10  .    Objective: Vital signs in last 24 hours: Vitals:   08/14/18 2050 08/14/18 2233 08/15/18 0219 08/15/18 0555  BP:  (Abnormal) 112/57 100/63 120/65  Pulse: 68 61 66 68  Resp: 16  15 16   Temp:  98.6 F (37 C) (Abnormal) 97.3 F (36.3 C) 97.8 F (36.6 C)  TempSrc:  Oral Oral Oral  SpO2:  97% 98% 100%  Weight:      Height:          Intake/Output from previous day: I/O last 3 completed shifts: In: 3167.5 [P.O.:240; I.V.:2382.5; IV Piggyback:545] Out: 1100 [Urine:600; Blood:500]   Intake/Output this shift: Total I/O In: 1178.1 [P.O.:700; I.V.:478.1] Out: 1600 [Urine:1600]   LABORATORY DATA: Recent Labs    08/15/18 0429  WBC 18.1*  HGB 11.2*  HCT 35.5*  PLT 182  NA 138  K 4.5  CL 107  CO2 24  BUN 24*  CREATININE 1.21  GLUCOSE 147*  CALCIUM 9.2    Examination: Neurologically intact ABD soft Neurovascular intact Sensation intact distally Intact pulses distally Dorsiflexion/Plantar flexion intact Incision: dressing C/D/I No cellulitis present Compartment soft} XR AP&Lat of hip shows well placed\fixed THA  Assessment:   1 Day Post-Op Procedure(s) (LRB): TOTAL HIP ARTHROPLASTY ANTERIOR APPROACH (Right) ADDITIONAL DIAGNOSIS:  Expected Acute Blood Loss Anemia,   Plan: PT/OT WBAT, THA  DVT Prophylaxis: SCDx72 hrs, ASA 81 mg BID x 2 weeks  DISCHARGE PLAN: Home,Today after physical therapy  DISCHARGE NEEDS: HHPT, Walker and 3-in-1 comode seatPatient ID: Joseph Maldonado., male   DOB: 1946/07/26, 72 y.o.   MRN: [id number]

## 2018-08-15 NOTE — Plan of Care (Signed)
Plan of care reviewed and discussed with the patient and spouse.  Questions answered to their satisfaction.

## 2018-08-15 NOTE — Progress Notes (Signed)
Physical Therapy Treatment Patient Details Name: Joseph Maldonado. MRN: [id number] DOB: 04/22/1946 Today's Date: 08/15/2018    History of Present Illness 72 yo male s/p R DA-THA on 08/14/18. PMH includes SCC, R RTC arthropathy, R TKR with multiple revisions, L TKR, L THA, CKD, OA, depression     PT Comments    Ready for Dc. Encouraged frequent ambulation.    Follow Up Recommendations  Follow surgeon's recommendation for DC plan and follow-up therapies;Supervision for mobility/OOB;Home health PT     Equipment Recommendations  None recommended by PT    Recommendations for Other Services       Precautions / Restrictions Precautions Precautions: Fall Restrictions Weight Bearing Restrictions: No    Mobility  Bed Mobility   Bed Mobility: Supine to Sit;Sit to Supine     Supine to sit: Min assist Sit to supine: Min assist   General bed mobility comments: required asist for right leg, demonstrated use of belt to self assist.  Transfers Overall transfer level: Needs assistance Equipment used: Rolling walker (2 wheeled) Transfers: Sit to/from Stand Sit to Stand: Min guard;From elevated surface         General transfer comment: Min guard for safety. Verbal cuing for hand placement.   Ambulation/Gait Ambulation/Gait assistance: Min guard Gait Distance (Feet): 100 Feet Assistive device: Rolling walker (2 wheeled) Gait Pattern/deviations: Step-to pattern;Step-through pattern;Antalgic Gait velocity: slightly decr    General Gait Details: Min guard for safety. Verbal cuing for sequencing, placement in RW, turning, Posture   Stairs Stairs: Yes Stairs assistance: Min assist Stair Management: Step to pattern;Forwards;With walker Number of Stairs: 1 General stair comments: cues  for sequence/safety   Wheelchair Mobility    Modified Rankin (Stroke Patients Only)       Balance                                            Cognition  Arousal/Alertness: Awake/alert                                            Exercises Other Exercises Other Exercises: seatwed self assisted hip flexion with towel    General Comments        Pertinent Vitals/Pain Pain Score: 6  Pain Location: R hip, with mobility  Pain Descriptors / Indicators: Cramping;Discomfort Pain Intervention(s): Monitored during session;Premedicated before session;Repositioned    Home Living                      Prior Function            PT Goals (current goals can now be found in the care plan section) Progress towards PT goals: Progressing toward goals    Frequency    7X/week      PT Plan Current plan remains appropriate    Co-evaluation              AM-PAC PT "6 Clicks" Daily Activity  Outcome Measure  Difficulty turning over in bed (including adjusting bedclothes, sheets and blankets)?: A Little Difficulty moving from lying on back to sitting on the side of the bed? : A Little Difficulty sitting down on and standing up from a chair with arms (e.g., wheelchair, bedside commode, etc,.)?: A Little Help needed moving to and  from a bed to chair (including a wheelchair)?: A Little Help needed walking in hospital room?: A Little Help needed climbing 3-5 steps with a railing? : A Little 6 Click Score: 18    End of Session   Activity Tolerance: Patient tolerated treatment well Patient left: in chair;with call bell/phone within reach;with family/visitor present Nurse Communication: Mobility status PT Visit Diagnosis: Other abnormalities of gait and mobility (R26.89);Difficulty in walking, not elsewhere classified (R26.2)     Time: 5726-2035 PT Time Calculation (min) (ACUTE ONLY): 19 min  Charges:  $Gait Training: 8-22 mins $Therapeutic Exercise: 8-22 mins                     Tresa Endo PT Acute Rehabilitation Services Pager [phone] Office [phone]    Claretha Cooper 08/15/2018,  11:47 AM

## 2018-08-15 NOTE — Discharge Summary (Signed)
Patient ID: Joseph Maldonado. MRN: [id number] DOB/AGE: 04/02/1946 72 y.o.  Admit date: 08/14/2018 Discharge date: 08/15/2018  Admission Diagnoses:  Principal Problem:   Osteoarthritis of right hip Active Problems:   History of total hip arthroplasty, right   Discharge Diagnoses:  Same  Past Medical History:  Diagnosis Date  . Arthritis   . CAD (coronary artery disease)   . Chronic kidney disease   . Complication of anesthesia    reports " i lose my blood pressure after anesthesia; the drops usually happens in recovery"   . Depression   . Hand pain    currently being evaluated by rheumatology for dx   . Hematuria   . Hypertension   . Rapid heart beat    rpeorts  " 2 months ago i had a rapid heart beat that lasted 45 min to an hour, ive experience this twice in the last t10 years but only seconds long" " i was evaluated by cardiology Dr Landry Mellow who did a 30 day monitor and ECHO and said i have occasional [PATS] ? , denies chest pain nor LOC  during these episodes; HR today is WDL   . Sleep apnea    CPAP     Surgeries: Procedure(s): TOTAL HIP ARTHROPLASTY ANTERIOR APPROACH on 08/14/2018   Consultants:   Discharged Condition: Improved  Hospital Course: Bertin Inabinet. is an 72 y.o. male who was admitted 08/14/2018 for operative treatment ofOsteoarthritis of right hip. Patient has severe unremitting pain that affects sleep, daily activities, and work/hobbies. After pre-op clearance the patient was taken to the operating room on 08/14/2018 and underwent  Procedure(s): TOTAL HIP ARTHROPLASTY ANTERIOR APPROACH.    Patient was given perioperative antibiotics:  Anti-infectives (From admission, onward)   Start     Dose/Rate Route Frequency Ordered Stop   08/14/18 0945  ceFAZolin (ANCEF) IVPB 2g/100 mL premix     2 g 200 mL/hr over 30 Minutes Intravenous On call to O.R. 08/14/18 0936 08/14/18 1230   08/14/18 0940  ceFAZolin (ANCEF) 2-4 GM/100ML-% IVPB    Note to  Pharmacy:  Waldron Session   : cabinet override      08/14/18 0940 08/14/18 2144       Patient was given sequential compression devices, early ambulation, and chemoprophylaxis to prevent DVT.  Patient benefited maximally from hospital stay and there were no complications.    Recent vital signs:  Patient Vitals for the past 24 hrs:  BP Temp Temp src Pulse Resp SpO2 Height Weight  08/15/18 0555 120/65 97.8 F (36.6 C) Oral 68 16 100 % no documentation no documentation  08/15/18 0219 100/63 (Abnormal) 97.3 F (36.3 C) Oral 66 15 98 % no documentation no documentation  08/14/18 2233 (Abnormal) 112/57 98.6 F (37 C) Oral 61 no documentation 97 % no documentation no documentation  08/14/18 2050 no documentation no documentation no documentation 68 16 no documentation no documentation no documentation  08/14/18 1946 118/66 98.4 F (36.9 C) Oral 70 no documentation 99 % no documentation no documentation  08/14/18 1844 123/84 97.8 F (36.6 C) no documentation (Abnormal) 52 16 100 % no documentation no documentation  08/14/18 1743 127/73 97.6 F (36.4 C) no documentation 60 14 100 % no documentation no documentation  08/14/18 1622 116/69 (Abnormal) 97.4 F (36.3 C) Oral (Abnormal) 55 14 100 % no documentation no documentation  08/14/18 1607 120/76 (Abnormal) 97.5 F (36.4 C) no documentation (Abnormal) 52 12 100 % no documentation no documentation  08/14/18 1600 121/72 no documentation  no documentation (Abnormal) 49 12 100 % no documentation no documentation  08/14/18 1545 111/73 no documentation no documentation (Abnormal) 53 13 100 % no documentation no documentation  08/14/18 1530 110/70 no documentation no documentation (Abnormal) 54 16 100 % no documentation no documentation  08/14/18 1515 115/62 no documentation no documentation (Abnormal) 51 (Abnormal) 23 100 % no documentation no documentation  08/14/18 1500 109/75 no documentation no documentation (Abnormal) 53 15 100 % no  documentation no documentation  08/14/18 1445 (Abnormal) 120/44 no documentation no documentation (Abnormal) 55 (Abnormal) 21 100 % no documentation no documentation  08/14/18 1442 110/71 (Abnormal) 97.5 F (36.4 C) no documentation (Abnormal) 54 11 100 % no documentation no documentation  08/14/18 1005 no documentation no documentation no documentation no documentation no documentation no documentation 5' 10.5" (1.791 m) 92.8 kg  08/14/18 0952 124/79 (Abnormal) 97.2 F (36.2 C) Oral 66 18 100 % no documentation no documentation     Recent laboratory studies:  Recent Labs    08/15/18 0429  WBC 18.1*  HGB 11.2*  HCT 35.5*  PLT 182  NA 138  K 4.5  CL 107  CO2 24  BUN 24*  CREATININE 1.21  GLUCOSE 147*  CALCIUM 9.2     Discharge Medications:   Allergies as of 08/15/2018    Allergen Reactions Comment   Nsaids  Affects creatinine   Dilaudid [hydromorphone Hcl] Other (See Comments) Suicidal      Medication List    Stop taking these medications   acetaminophen 500 MG tablet Commonly known as:  TYLENOL     Take these medications   aspirin EC 81 MG tablet Take 1 tablet (81 mg total) by mouth 2 (two) times daily. What changed:  when to take this   atorvastatin 20 MG tablet Commonly known as:  LIPITOR Take 20 mg by mouth at bedtime.   buPROPion 200 MG 12 hr tablet Commonly known as:  WELLBUTRIN SR TAKE 1 TABLET TWICE A DAY What changed:    how much to take  how to take this  when to take this  additional instructions   fexofenadine 180 MG tablet Commonly known as:  ALLEGRA Take 180 mg by mouth at bedtime.   fluticasone 50 MCG/ACT nasal spray Commonly known as:  FLONASE Place 2 sprays into both nostrils daily. What changed:    how much to take  when to take this   losartan 100 MG tablet Commonly known as:  COZAAR TAKE 1 TABLET BY MOUTH EVERY DAY What changed:  when to take this   oxyCODONE-acetaminophen 5-325 MG tablet Commonly known as:   PERCOCET/ROXICET Take 1 tablet by mouth every 4 (four) hours as needed for severe pain.   pantoprazole 40 MG tablet Commonly known as:  PROTONIX Take 1 tablet (40 mg total) by mouth daily. Take 30-60 min before first meal of the day   ranitidine 150 MG tablet Commonly known as:  ZANTAC Take 1 tablet (150 mg total) by mouth at bedtime.   tiZANidine 2 MG tablet Commonly known as:  ZANAFLEX Take 1 tablet (2 mg total) by mouth every 6 (six) hours as needed.        Durable Medical Equipment  (From admission, onward)         Start     Ordered   08/14/18 1632  DME Walker rolling  Once    Question:  Patient needs a walker to treat with the following condition  Answer:  Status post right hip replacement  08/14/18 1631   08/14/18 1632  DME 3 n 1  Once     08/14/18 1631           Discharge Care Instructions  (From admission, onward)         Start     Ordered   08/15/18 0000  Change dressing    Comments:  You may change your dressing if window on dressing has more than 40% drainage   08/15/18 0704          Diagnostic Studies: Dg C-arm 1-60 Min-no Report  Result Date: 08/14/2018 Fluoroscopy was utilized by the requesting physician.  No radiographic interpretation.   Dg Hip Operative Unilat W Or W/o Pelvis Right  Result Date: 08/14/2018 CLINICAL DATA:  72 year old male post right hip replacement. Initial encounter. EXAM: OPERATIVE right HIP (WITH PELVIS IF PERFORMED) 2 VIEWS TECHNIQUE: Fluoroscopic spot image(s) were submitted for interpretation post-operatively. Fluoroscopic time: 11 seconds. COMPARISON:  None. FINDINGS: Post total right hip replacement which appears in satisfactory position without complication noted on frontal projection. Portion of left hip replacement visualized. IMPRESSION: Post total right hip replacement. Electronically Signed   By: Genia Del M.D.   On: 08/14/2018 14:17    Disposition: Discharge disposition: 01-Home or Self  Care       Discharge Instructions    Call MD / Call 911   Complete by:  As directed    If you experience chest pain or shortness of breath, CALL 911 and be transported to the hospital emergency room.  If you develope a fever above 101 F, pus (white drainage) or increased drainage or redness at the wound, or calf pain, call your surgeon's office.   Change dressing   Complete by:  As directed    You may change your dressing if window on dressing has more than 40% drainage   Constipation Prevention   Complete by:  As directed    Drink plenty of fluids.  Prune juice may be helpful.  You may use a stool softener, such as Colace (over the counter) 100 mg twice a day.  Use MiraLax (over the counter) for constipation as needed.   Diet - low sodium heart healthy   Complete by:  As directed    Increase activity slowly as tolerated   Complete by:  As directed       Follow-up Information    Frederik Pear, MD. Go on 08/29/2018.   Specialty:  Orthopedic Surgery Why:  Your appointment has been made for 10:00  Contact information: [place] [address] [phone]        [facility] Go on 08/29/2018.   Why:  Your appointment has been set for 1120. You will go directly across the lobby to start after your appointment with Dr. Doy Mince information: Physical Therapy [address] [place] [address] [phone]        Home, [facility] Follow up.   Specialty:  [place] Why:  you will be seen by HHPT for 5 visits prior to starting outpatient therapy  Contact information: [place] [address] [phone]        Frederik Pear, MD In 2 weeks.   Specialty:  Orthopedic Surgery Contact information: [place] [address] [phone]            Signed: Kerin Salen 08/15/2018, 7:06 AM

## 2018-08-15 NOTE — Progress Notes (Signed)
Physical Therapy Treatment Patient Details Name: Joseph Maldonado. MRN: [id number] DOB: Apr 14, 1946 Today's Date: 08/15/2018    History of Present Illness 72 yo male s/p R DA-THA on 08/14/18. PMH includes SCC, R RTC arthropathy, R TKR with multiple revisions, L TKR, L THA, CKD, OA, depression     PT Comments    Progressing well. Plan 1 more visit , then DC.   Follow Up Recommendations  Follow surgeon's recommendation for DC plan and follow-up therapies;Supervision for mobility/OOB;Home health PT     Equipment Recommendations  None recommended by PT    Recommendations for Other Services       Precautions / Restrictions Precautions Precautions: Fall    Mobility  Bed Mobility   Bed Mobility: Supine to Sit;Sit to Supine     Supine to sit: Min assist Sit to supine: Min assist   General bed mobility comments: required asist for right leg, demonstrated use of belt to self assist.  Transfers Overall transfer level: Needs assistance Equipment used: Rolling walker (2 wheeled) Transfers: Sit to/from Stand Sit to Stand: Min guard;From elevated surface         General transfer comment: Min guard for safety. Verbal cuing for hand placement.   Ambulation/Gait Ambulation/Gait assistance: Min guard Gait Distance (Feet): 100 Feet Assistive device: Rolling walker (2 wheeled) Gait Pattern/deviations: Step-to pattern;Step-through pattern Gait velocity: slightly decr    General Gait Details: Min guard for safety. Verbal cuing for sequencing, placement in RW, turning, Posture   Stairs Stairs: Yes Stairs assistance: Min assist Stair Management: Step to pattern;Forwards;With walker Number of Stairs: 1 General stair comments: cues  for sequence/safety   Wheelchair Mobility    Modified Rankin (Stroke Patients Only)       Balance                                            Cognition Arousal/Alertness: Awake/alert                                             Exercises Total Joint Exercises Ankle Circles/Pumps: AROM;Both;10 reps;Supine Short Arc Quad: AROM;Right;10 reps;Supine Heel Slides: AAROM;Right;10 reps;Supine Hip ABduction/ADduction: AAROM;Right;10 reps;Supine    General Comments        Pertinent Vitals/Pain Pain Score: 4  Pain Location: R hip, with mobility  Pain Descriptors / Indicators: Aching;Sore Pain Intervention(s): Monitored during session;RN gave pain meds during session;Premedicated before session;Repositioned;Ice applied    Home Living                      Prior Function            PT Goals (current goals can now be found in the care plan section) Progress towards PT goals: Progressing toward goals    Frequency    7X/week      PT Plan Current plan remains appropriate    Co-evaluation              AM-PAC PT "6 Clicks" Daily Activity  Outcome Measure  Difficulty turning over in bed (including adjusting bedclothes, sheets and blankets)?: A Little Difficulty moving from lying on back to sitting on the side of the bed? : A Little Difficulty sitting down on and standing up from a chair with arms (  e.g., wheelchair, bedside commode, etc,.)?: A Little Help needed moving to and from a bed to chair (including a wheelchair)?: A Little Help needed walking in hospital room?: A Little Help needed climbing 3-5 steps with a railing? : A Little 6 Click Score: 18    End of Session   Activity Tolerance: Patient tolerated treatment well Patient left: in bed;with call bell/phone within reach;with family/visitor present Nurse Communication: Mobility status PT Visit Diagnosis: Other abnormalities of gait and mobility (R26.89);Difficulty in walking, not elsewhere classified (R26.2)     Time: 6415-8309 PT Time Calculation (min) (ACUTE ONLY): 36 min  Charges:  $Gait Training: 8-22 mins $Therapeutic Exercise: 8-22 mins                     Tresa Endo PT Acute  Rehabilitation Services Pager [phone] Office [phone]    Claretha Cooper 08/15/2018, 9:51 AM

## 2018-08-16 DIAGNOSIS — I2510 Atherosclerotic heart disease of native coronary artery without angina pectoris: Secondary | ICD-10-CM | POA: Diagnosis not present

## 2018-08-16 DIAGNOSIS — Z85828 Personal history of other malignant neoplasm of skin: Secondary | ICD-10-CM | POA: Diagnosis not present

## 2018-08-16 DIAGNOSIS — N189 Chronic kidney disease, unspecified: Secondary | ICD-10-CM | POA: Diagnosis not present

## 2018-08-16 DIAGNOSIS — Z96641 Presence of right artificial hip joint: Secondary | ICD-10-CM | POA: Diagnosis not present

## 2018-08-16 DIAGNOSIS — G4730 Sleep apnea, unspecified: Secondary | ICD-10-CM | POA: Diagnosis not present

## 2018-08-16 DIAGNOSIS — I129 Hypertensive chronic kidney disease with stage 1 through stage 4 chronic kidney disease, or unspecified chronic kidney disease: Secondary | ICD-10-CM | POA: Diagnosis not present

## 2018-08-16 DIAGNOSIS — F329 Major depressive disorder, single episode, unspecified: Secondary | ICD-10-CM | POA: Diagnosis not present

## 2018-08-16 DIAGNOSIS — Z9181 History of falling: Secondary | ICD-10-CM | POA: Diagnosis not present

## 2018-08-16 DIAGNOSIS — Z96653 Presence of artificial knee joint, bilateral: Secondary | ICD-10-CM | POA: Diagnosis not present

## 2018-08-16 DIAGNOSIS — Z7982 Long term (current) use of aspirin: Secondary | ICD-10-CM | POA: Diagnosis not present

## 2018-08-16 DIAGNOSIS — Z471 Aftercare following joint replacement surgery: Secondary | ICD-10-CM | POA: Diagnosis not present

## 2018-08-16 DIAGNOSIS — Z87891 Personal history of nicotine dependence: Secondary | ICD-10-CM | POA: Diagnosis not present

## 2018-08-18 DIAGNOSIS — I2510 Atherosclerotic heart disease of native coronary artery without angina pectoris: Secondary | ICD-10-CM | POA: Diagnosis not present

## 2018-08-18 DIAGNOSIS — G4730 Sleep apnea, unspecified: Secondary | ICD-10-CM | POA: Diagnosis not present

## 2018-08-18 DIAGNOSIS — I129 Hypertensive chronic kidney disease with stage 1 through stage 4 chronic kidney disease, or unspecified chronic kidney disease: Secondary | ICD-10-CM | POA: Diagnosis not present

## 2018-08-18 DIAGNOSIS — Z471 Aftercare following joint replacement surgery: Secondary | ICD-10-CM | POA: Diagnosis not present

## 2018-08-18 DIAGNOSIS — F329 Major depressive disorder, single episode, unspecified: Secondary | ICD-10-CM | POA: Diagnosis not present

## 2018-08-18 DIAGNOSIS — N189 Chronic kidney disease, unspecified: Secondary | ICD-10-CM | POA: Diagnosis not present

## 2018-08-21 DIAGNOSIS — Z471 Aftercare following joint replacement surgery: Secondary | ICD-10-CM | POA: Diagnosis not present

## 2018-08-21 DIAGNOSIS — I129 Hypertensive chronic kidney disease with stage 1 through stage 4 chronic kidney disease, or unspecified chronic kidney disease: Secondary | ICD-10-CM | POA: Diagnosis not present

## 2018-08-21 DIAGNOSIS — N189 Chronic kidney disease, unspecified: Secondary | ICD-10-CM | POA: Diagnosis not present

## 2018-08-21 DIAGNOSIS — I2510 Atherosclerotic heart disease of native coronary artery without angina pectoris: Secondary | ICD-10-CM | POA: Diagnosis not present

## 2018-08-21 DIAGNOSIS — F329 Major depressive disorder, single episode, unspecified: Secondary | ICD-10-CM | POA: Diagnosis not present

## 2018-08-21 DIAGNOSIS — G4730 Sleep apnea, unspecified: Secondary | ICD-10-CM | POA: Diagnosis not present

## 2018-08-23 DIAGNOSIS — Z471 Aftercare following joint replacement surgery: Secondary | ICD-10-CM | POA: Diagnosis not present

## 2018-08-23 DIAGNOSIS — I129 Hypertensive chronic kidney disease with stage 1 through stage 4 chronic kidney disease, or unspecified chronic kidney disease: Secondary | ICD-10-CM | POA: Diagnosis not present

## 2018-08-23 DIAGNOSIS — N189 Chronic kidney disease, unspecified: Secondary | ICD-10-CM | POA: Diagnosis not present

## 2018-08-23 DIAGNOSIS — F329 Major depressive disorder, single episode, unspecified: Secondary | ICD-10-CM | POA: Diagnosis not present

## 2018-08-23 DIAGNOSIS — I2510 Atherosclerotic heart disease of native coronary artery without angina pectoris: Secondary | ICD-10-CM | POA: Diagnosis not present

## 2018-08-23 DIAGNOSIS — G4730 Sleep apnea, unspecified: Secondary | ICD-10-CM | POA: Diagnosis not present

## 2018-08-28 DIAGNOSIS — I129 Hypertensive chronic kidney disease with stage 1 through stage 4 chronic kidney disease, or unspecified chronic kidney disease: Secondary | ICD-10-CM | POA: Diagnosis not present

## 2018-08-28 DIAGNOSIS — N189 Chronic kidney disease, unspecified: Secondary | ICD-10-CM | POA: Diagnosis not present

## 2018-08-28 DIAGNOSIS — F329 Major depressive disorder, single episode, unspecified: Secondary | ICD-10-CM | POA: Diagnosis not present

## 2018-08-28 DIAGNOSIS — Z471 Aftercare following joint replacement surgery: Secondary | ICD-10-CM | POA: Diagnosis not present

## 2018-08-28 DIAGNOSIS — G4730 Sleep apnea, unspecified: Secondary | ICD-10-CM | POA: Diagnosis not present

## 2018-08-28 DIAGNOSIS — I2510 Atherosclerotic heart disease of native coronary artery without angina pectoris: Secondary | ICD-10-CM | POA: Diagnosis not present

## 2018-08-29 DIAGNOSIS — M1611 Unilateral primary osteoarthritis, right hip: Secondary | ICD-10-CM | POA: Diagnosis not present

## 2018-08-30 DIAGNOSIS — L308 Other specified dermatitis: Secondary | ICD-10-CM | POA: Diagnosis not present

## 2018-08-30 DIAGNOSIS — D2262 Melanocytic nevi of left upper limb, including shoulder: Secondary | ICD-10-CM | POA: Diagnosis not present

## 2018-08-30 DIAGNOSIS — Z85828 Personal history of other malignant neoplasm of skin: Secondary | ICD-10-CM | POA: Diagnosis not present

## 2018-08-30 DIAGNOSIS — D225 Melanocytic nevi of trunk: Secondary | ICD-10-CM | POA: Diagnosis not present

## 2018-08-30 DIAGNOSIS — L821 Other seborrheic keratosis: Secondary | ICD-10-CM | POA: Diagnosis not present

## 2018-08-30 DIAGNOSIS — L570 Actinic keratosis: Secondary | ICD-10-CM | POA: Diagnosis not present

## 2018-08-30 DIAGNOSIS — D1801 Hemangioma of skin and subcutaneous tissue: Secondary | ICD-10-CM | POA: Diagnosis not present

## 2018-08-30 DIAGNOSIS — L814 Other melanin hyperpigmentation: Secondary | ICD-10-CM | POA: Diagnosis not present

## 2018-09-04 DIAGNOSIS — Z96641 Presence of right artificial hip joint: Secondary | ICD-10-CM | POA: Diagnosis not present

## 2018-09-04 DIAGNOSIS — M25651 Stiffness of right hip, not elsewhere classified: Secondary | ICD-10-CM | POA: Diagnosis not present

## 2018-09-07 DIAGNOSIS — E663 Overweight: Secondary | ICD-10-CM | POA: Diagnosis not present

## 2018-09-07 DIAGNOSIS — Z96641 Presence of right artificial hip joint: Secondary | ICD-10-CM | POA: Diagnosis not present

## 2018-09-07 DIAGNOSIS — M25651 Stiffness of right hip, not elsewhere classified: Secondary | ICD-10-CM | POA: Diagnosis not present

## 2018-09-07 DIAGNOSIS — M064 Inflammatory polyarthropathy: Secondary | ICD-10-CM | POA: Diagnosis not present

## 2018-09-07 DIAGNOSIS — Z6829 Body mass index (BMI) 29.0-29.9, adult: Secondary | ICD-10-CM | POA: Diagnosis not present

## 2018-09-07 DIAGNOSIS — M2550 Pain in unspecified joint: Secondary | ICD-10-CM | POA: Diagnosis not present

## 2018-09-07 DIAGNOSIS — M150 Primary generalized (osteo)arthritis: Secondary | ICD-10-CM | POA: Diagnosis not present

## 2018-09-08 DIAGNOSIS — G4733 Obstructive sleep apnea (adult) (pediatric): Secondary | ICD-10-CM | POA: Diagnosis not present

## 2018-09-08 DIAGNOSIS — I10 Essential (primary) hypertension: Secondary | ICD-10-CM | POA: Diagnosis not present

## 2018-09-08 DIAGNOSIS — I2510 Atherosclerotic heart disease of native coronary artery without angina pectoris: Secondary | ICD-10-CM | POA: Diagnosis not present

## 2018-09-08 DIAGNOSIS — I712 Thoracic aortic aneurysm, without rupture: Secondary | ICD-10-CM | POA: Diagnosis not present

## 2018-09-11 DIAGNOSIS — E875 Hyperkalemia: Secondary | ICD-10-CM | POA: Diagnosis not present

## 2018-09-11 DIAGNOSIS — D631 Anemia in chronic kidney disease: Secondary | ICD-10-CM | POA: Diagnosis not present

## 2018-09-11 DIAGNOSIS — N189 Chronic kidney disease, unspecified: Secondary | ICD-10-CM | POA: Diagnosis not present

## 2018-09-11 DIAGNOSIS — N183 Chronic kidney disease, stage 3 (moderate): Secondary | ICD-10-CM | POA: Diagnosis not present

## 2018-09-11 DIAGNOSIS — I129 Hypertensive chronic kidney disease with stage 1 through stage 4 chronic kidney disease, or unspecified chronic kidney disease: Secondary | ICD-10-CM | POA: Diagnosis not present

## 2018-09-11 DIAGNOSIS — R319 Hematuria, unspecified: Secondary | ICD-10-CM | POA: Diagnosis not present

## 2018-09-12 DIAGNOSIS — M25651 Stiffness of right hip, not elsewhere classified: Secondary | ICD-10-CM | POA: Diagnosis not present

## 2018-09-12 DIAGNOSIS — Z96641 Presence of right artificial hip joint: Secondary | ICD-10-CM | POA: Diagnosis not present

## 2018-09-14 ENCOUNTER — Ambulatory Visit: Payer: Medicare Other | Admitting: Neurology

## 2018-09-14 ENCOUNTER — Other Ambulatory Visit: Payer: Self-pay | Admitting: Family Medicine

## 2018-09-14 DIAGNOSIS — Z96641 Presence of right artificial hip joint: Secondary | ICD-10-CM | POA: Diagnosis not present

## 2018-09-14 DIAGNOSIS — M25651 Stiffness of right hip, not elsewhere classified: Secondary | ICD-10-CM | POA: Diagnosis not present

## 2018-09-14 DIAGNOSIS — M25561 Pain in right knee: Secondary | ICD-10-CM | POA: Diagnosis not present

## 2018-09-14 NOTE — Telephone Encounter (Signed)
Please advise patient was last seen by you on 03/13/2018 and wants a refill but do not want to come into the office to be seen

## 2018-09-14 NOTE — Telephone Encounter (Signed)
Called patient to schedule an appointment with Dr. Carlota Raspberry. He did not want to schedule.  LOV with Dr. Carlota Raspberry was 03/13/18.

## 2018-09-16 NOTE — Telephone Encounter (Signed)
Refilled losartan 

## 2018-09-18 DIAGNOSIS — M1611 Unilateral primary osteoarthritis, right hip: Secondary | ICD-10-CM | POA: Diagnosis not present

## 2018-09-21 DIAGNOSIS — Z96641 Presence of right artificial hip joint: Secondary | ICD-10-CM | POA: Diagnosis not present

## 2018-09-21 DIAGNOSIS — M25651 Stiffness of right hip, not elsewhere classified: Secondary | ICD-10-CM | POA: Diagnosis not present

## 2018-10-03 ENCOUNTER — Encounter: Payer: Self-pay | Admitting: Neurology

## 2018-10-04 ENCOUNTER — Encounter: Payer: Self-pay | Admitting: Neurology

## 2018-10-05 ENCOUNTER — Ambulatory Visit (INDEPENDENT_AMBULATORY_CARE_PROVIDER_SITE_OTHER): Payer: Medicare Other | Admitting: Neurology

## 2018-10-05 ENCOUNTER — Encounter: Payer: Self-pay | Admitting: Neurology

## 2018-10-05 ENCOUNTER — Encounter

## 2018-10-05 VITALS — BP 126/77 | HR 69 | Ht 70.0 in | Wt 212.0 lb

## 2018-10-05 DIAGNOSIS — Z9989 Dependence on other enabling machines and devices: Secondary | ICD-10-CM | POA: Diagnosis not present

## 2018-10-05 DIAGNOSIS — G4733 Obstructive sleep apnea (adult) (pediatric): Secondary | ICD-10-CM | POA: Diagnosis not present

## 2018-10-05 NOTE — Progress Notes (Signed)
Received this notice from [place] regarding pt's cpap and supplies being withheld from him: Absolutely not he has met compliance we just the F2F from today with you all so l can get him supplies. We will pull the notes and take care of his supplies now."

## 2018-10-05 NOTE — Progress Notes (Signed)
Subjective:    Patient ID: Joseph Maldonado. is a 73 y.o. male.  HPI     Interim history:   Joseph Maldonado is a 73 year old right-handed gentleman with an underlying medical history of arthritis, chronic kidney disease, depression, hypertension, reflux disease, chronic cough and overweight state, who presents for follow-up consultation of his obstructive sleep apnea, after a recent split-night sleep study and starting CPAP therapy at home. The patient is unaccompanied today. I first met him on 04/06/2018 at the request of his primary care physician, at which time Joseph Maldonado reported snoring and daytime somnolence. Joseph Maldonado was advised to proceed with a sleep study. Joseph Maldonado had a split-night sleep study on 05/22/2018. I went over his test results with him in detail today. Baseline sleep latency was delayed at 91.5 minutes and REM sleep was absent. Joseph Maldonado had a total AHI of 52.2 per hour, average oxygen saturation was 96%, nadir was 78%. Joseph Maldonado had no significant PLMS, no significant EKG changes. Joseph Maldonado was fitted with a large full facemask due to being a mouth breather and CPAP was titrated from 5 cm to 12 cm. On the final pressure his AHI was 0 per hour, with supine non-REM sleep achieved an O2 nadir of 95%. Based on his test results I prescribed CPAP therapy for home use at a pressure of 12 cm.  Today, 10/05/2018: I reviewed his CPAP compliance data from 09/04/2018 through 10/03/2018 which is a total of 30 days, during which time Joseph Maldonado used his CPAP every night with percent used days greater than 4 hours at 97%, indicating excellent compliance with an average usage of 7 hours and 17 minutes, residual AHI at goal at 3.8 per hour, leak on the higher end with the 95th percentile at 23.7 L/m on a pressure of 12 cm with EPR of 1. Joseph Maldonado reports doing rather well with his new machine. Of note, Joseph Maldonado had recent right total hip arthroplasty on 08/14/2018 under Dr. Mayer Camel. Joseph Maldonado has been compliant since the set up and I reviewed his 90 day compliance as well.  Joseph Maldonado did have to cancel an interim appt due to his hip surgery, which is understandable and his dog had to have emergency surgery too. Joseph Maldonado has done well with regards to his hip surgery. In the past few years Joseph Maldonado has been able to lose weight, in the realm of 80 pounds, Joseph Maldonado has been able to keep it off. Joseph Maldonado is very pleased with his sleep consolidation, sleep quality and better daytime energy and highly motivated to continue with treatment with CPAP therapy, Joseph Maldonado tried a different mask in the interim but went back to his full facemask fairly quickly. Joseph Maldonado has increase the humidity setting due to mouth dryness. Joseph Maldonado tries to hydrate well with water.  The patient's allergies, current medications, family history, past medical history, past social history, past surgical history and problem list were reviewed and updated as appropriate.   Previously:  04/06/2018: (Joseph Maldonado) reports snoring and excessive daytime somnolence. His wife is noted pauses in his breathing while asleep. Joseph Maldonado saw Dr. Jaynee Eagles last year after a fall. I reviewed your office note from 01/24/2018 as well as 03/13/2018. Joseph Maldonado recently saw pulmonology for his chronic cough. His Epworth sleepiness score is 3 out of 24, fatigue score is 32 out of 63. Joseph Maldonado denies a family history of sleep apnea. Joseph Maldonado reports a family history of brain aneurysm in his mother and brain cancer in his father. Joseph Maldonado would like to have a brain MRI done but cannot have  contrast because of chronic kidney disease. For his chronic cough Joseph Maldonado was recently started on medication by pulmonology including anti-histamine and reflux medicine. His wife reports that there are some nights where Joseph Maldonado snores very little and has no apneas but there are some other nights where Joseph Maldonado has louder snoring and breathing pauses as well as gasping sounds. Joseph Maldonado has nocturia about 2-3 times per average night and denies morning headaches. 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 They have 2 grown children, a daughter and a son.  Wife reports that his family and she have noticed changes in his behavior and low frustration tolerance recently. Of note, patient as his intolerance is provoked. Joseph Maldonado quit smoking in 1970. Joseph Maldonado drinks less than 2 beers per week and has not had any alcohol in several weeks. Joseph Maldonado drinks caffeine in the form of 2 large cups per day in the mornings. They have 2 dogs and 1 cat, dogs are often in the bedroom at night. His bedtime is between 10 and 11 PM, rise time around 7 or 7:30. Joseph Maldonado had a uvulectomy when Joseph Maldonado was in his 52s for snoring and Joseph Maldonado had a tonsillectomy as a child. Joseph Maldonado typically sleeps on his back because of bilateral shoulder pain. Joseph Maldonado had his left hip replaced as well as both knees.  His Past Medical History Is Significant For: Past Medical History:  Diagnosis Date  . Arthritis   . CAD (coronary artery disease)   . Chronic kidney disease   . Complication of anesthesia    reports " i lose my blood pressure after anesthesia; the drops usually happens in recovery"   . Depression   . Hand pain    currently being evaluated by rheumatology for dx   . Hematuria   . Hypertension   . Rapid heart beat    rpeorts  " 2 months ago i had a rapid heart beat that lasted 45 min to an hour, ive experience this twice in the last t10 years but only seconds long" " i was evaluated by cardiology Dr Landry Mellow who did a 30 day monitor and ECHO and said i have occasional [PATS] ? , denies chest pain nor LOC  during these episodes; HR today is WDL   . Sleep apnea    CPAP     His Past Surgical History Is Significant For: Past Surgical History:  Procedure Laterality Date  . CATARACT EXTRACTION, BILATERAL    . HIP SURGERY Left 2012  . JOINT REPLACEMENT    . REPLACEMENT TOTAL KNEE Right    2001,2006,2009,2013  . REPLACEMENT TOTAL KNEE Left 2011  . TOTAL HIP ARTHROPLASTY Right 08/14/2018   Procedure: TOTAL HIP ARTHROPLASTY ANTERIOR APPROACH;  Surgeon: Frederik Pear, MD;  Location: [facility];  Service: Orthopedics;   Laterality: Right;    His Family History Is Significant For: Family History  Problem Relation Age of Onset  . Cancer Mother        throat  . Hypertension Mother   . Heart disease Mother   . Emphysema Mother   . Hypertension Father   . Cancer Father        Brain  . Hypertension Sister   . Hematuria Sister   . Hematuria Brother     His Social History Is Significant For: Social History   Socioeconomic History  . Marital status: Married    Spouse name: Not on file  . Number of children: Not on file  . Years of education:  Not on file  . Highest education level: Bachelor's degree (e.g., BA, AB, BS)  Occupational History  . Occupation: Retired  Scientific laboratory technician  . Financial resource strain: Not hard at all  . Food insecurity:    Worry: Never true    Inability: Never true  . Transportation needs:    Medical: No    Non-medical: No  Tobacco Use  . Smoking status: Former Smoker    Types: Cigarettes    Last attempt to quit: 1971    Years since quitting: 49.0  . Smokeless tobacco: Never Used  Substance and Sexual Activity  . Alcohol use: Yes    Alcohol/week: 3.0 standard drinks    Types: 3 Cans of beer per week    Comment: max 3 beers/week  . Drug use: No    Comment: former user of marijuana, quit in 1971  . Sexual activity: Yes  Lifestyle  . Physical activity:    Days per week: 0 days    Minutes per session: 0 min  . Stress: Not at all  Relationships  . Social connections:    Talks on phone: More than three times a week    Gets together: Once a week    Attends religious service: More than 4 times per year    Active member of club or organization: Yes    Attends meetings of clubs or organizations: More than 4 times per year    Relationship status: Married  Other Topics Concern  . Not on file  Social History Narrative   Lives with his wife.   Education: College   Exercise: No   Drinks about 2 large coffees per day   Right handed      Spent many years in  [place].  Moved to [place] to be near their children.  Daughter lives in [place].  Their son has lived in the [place] area with plans to move to [place] in summer 2019.    His Allergies Are:  Allergies  Allergen Reactions  . Nsaids     Affects creatinine  . Dilaudid [Hydromorphone Hcl] Other (See Comments)    Suicidal  :   His Current Medications Are:  Outpatient Encounter Medications as of 10/05/2018  Medication Sig  . aspirin EC 81 MG tablet Take 1 tablet (81 mg total) by mouth 2 (two) times daily.  Marland Kitchen atorvastatin (LIPITOR) 20 MG tablet Take 20 mg by mouth at bedtime.  Marland Kitchen buPROPion (WELLBUTRIN SR) 200 MG 12 hr tablet TAKE 1 TABLET TWICE A DAY (Patient taking differently: Take 400 mg by mouth daily. )  . fexofenadine (ALLEGRA) 180 MG tablet Take 180 mg by mouth at bedtime.  Marland Kitchen losartan (COZAAR) 100 MG tablet TAKE 1 TABLET BY MOUTH EVERY DAY  . [DISCONTINUED] fluticasone (FLONASE) 50 MCG/ACT nasal spray Place 2 sprays into both nostrils daily. (Patient taking differently: Place 1 spray into both nostrils at bedtime. )  . [DISCONTINUED] oxyCODONE-acetaminophen (PERCOCET/ROXICET) 5-325 MG tablet Take 1 tablet by mouth every 4 (four) hours as needed for severe pain.  . [DISCONTINUED] pantoprazole (PROTONIX) 40 MG tablet Take 1 tablet (40 mg total) by mouth daily. Take 30-60 min before first meal of the day (Patient not taking: Reported on 08/02/2018)  . [DISCONTINUED] ranitidine (ZANTAC) 150 MG tablet Take 1 tablet (150 mg total) by mouth at bedtime. (Patient not taking: Reported on 08/02/2018)  . [DISCONTINUED] tiZANidine (ZANAFLEX) 2 MG tablet Take 1 tablet (2 mg total) by mouth every 6 (six) hours as needed.  No facility-administered encounter medications on file as of 10/05/2018.   :  Review of Systems:  Out of a complete 14 point review of systems, all are reviewed and negative with the exception of these symptoms as listed below:  Review of Systems  Neurological:       Pt presents today  to discuss his cpap. Pt reports that his cpap is going well. Joseph Maldonado is worried about being beyond the 31-90 day window for compliance.    Objective:  Neurological Exam  Physical Exam Physical Examination:   Vitals:   10/05/18 1256  BP: 126/77  Pulse: 69   General Examination: The patient is a very pleasant 73 y.o. male in no acute distress. Joseph Maldonado appears well-developed and well-nourished and well groomed.   HEENT: Normocephalic, atraumatic, pupils are equal, round and reactive to light and accommodation. Extraocular tracking is good without limitation to gaze excursion or nystagmus noted. Normal smooth pursuit is noted. Hearing is grossly intact, b/l Hearing aids in place. Face is symmetric with normal facial animation and normal facial sensation. Speech is clear with no dysarthria noted. There is no hypophonia. There is no lip, neck/head, jaw or voice tremor. Neck shows FROM. Oropharynx exam reveals: mild mouth dryness, adequate dental hygiene and mild airway crowding.   Chest: Clear to auscultation without wheezing, rhonchi or crackles noted.  Heart: S1+S2+0, regular and normal without murmurs, rubs or gallops noted.   Abdomen: Soft, non-tender and non-distended with normal bowel sounds appreciated on auscultation.  Extremities: There is no pitting edema in the distal lower extremities bilaterally. Pedal pulses are intact.  Skin: Warm and dry without trophic changes noted. There are mild varicose veins.  Musculoskeletal: exam reveals no obvious change. Lift in R show.   Neurologically:  Mental status: The patient is awake, alert and oriented in all 4 spheres. His immediate and remote memory, attention, language skills and fund of knowledge are appropriate. There is no evidence of aphasia, agnosia, apraxia or anomia. Speech is clear with normal prosody and enunciation. Thought process is linear. Mood is normal and affect is blunted.  Cranial nerves II - XII are as described above  under HEENT exam. In addition: shoulder shrug is normal with equal shoulder height noted. Motor exam: Normal bulk, strength and tone is noted. There is tremor. Fine motor skills are grossly intact. Cerebellar testing shows no dysmetria or intention tremor.  Sensory exam: intact to light touch.   Gait, station and balance: Joseph Maldonado stands with mild difficulty. Posture is age-appropriate. Joseph Maldonado walks with a limp on the right. No walking aid.   Assessment and Plan:   In summary, Joseph Buckles. is a very pleasant 73 year old male with an underlying medical history of arthritis, chronic kidney disease, depression, hypertension, reflux disease, chronic cough and overweight state/borderline obesity, who presents for follow-up consultation of his obstructive sleep apnea which was deemed to be in the severe range but split night sleep study testing in August 2019. Joseph Maldonado has established treatment with CPAP therapy and has been compliant since set up. Joseph Maldonado had to cancel an interim follow-up appointment secondary to his right hip surgery and emergency surgery needed for his dog. Joseph Maldonado is fully compliant with treatment and highly commended for it. Joseph Maldonado has benefited from treatment with CPAP therapy in that his daytime somnolence is less, his sleep is better quality and better consolidated and Joseph Maldonado is able to sleep at least 7 hours. Joseph Maldonado has been up-to-date with his supplies and changed the filter a couple  of times, has been using a fullface mask. There is a higher leak at times due to position changes and his facial hair. Nevertheless, his settings are fine and Joseph Maldonado is encouraged to continue with full compliance with his CPAP. I suggested a routine follow-up in 6 months, sooner if needed. If Joseph Maldonado continues to do well Joseph Maldonado can be seen yearly thereafter. I answered all his questions today and Joseph Maldonado was in agreement. I spent 25 minutes in total face-to-face time with the patient, more than 50% of which was spent in counseling and coordination of  care, reviewing test results, reviewing medication and discussing or reviewing the diagnosis of OSA, its prognosis and treatment options. Pertinent laboratory and imaging test results that were available during this visit with the patient were reviewed by me and considered in my medical decision making (see chart for details).

## 2018-10-05 NOTE — Patient Instructions (Addendum)
Please continue using your CPAP regularly. While your insurance requires that you use CPAP at least 4 hours each night on 70% of the nights, I recommend, that you not skip any nights and use it throughout the night if you can. Getting used to CPAP and staying with the treatment long term does take time and patience and discipline. Untreated obstructive sleep apnea when it is moderate to severe can have an adverse impact on cardiovascular health and raise her risk for heart disease, arrhythmias, hypertension, congestive heart failure, stroke and diabetes. Untreated obstructive sleep apnea causes sleep disruption, nonrestorative sleep, and sleep deprivation. This can have an impact on your day to day functioning and cause daytime sleepiness and impairment of cognitive function, memory loss, mood disturbance, and problems focussing. Using CPAP regularly can improve these symptoms.  Keep up the good work! I will see you back in 6 months for sleep apnea check up, and if you continue to do well on CPAP I will see you once a year thereafter.  Very good job with your compliance. I am glad you are doing well with the right hip.

## 2018-10-10 DIAGNOSIS — Z96641 Presence of right artificial hip joint: Secondary | ICD-10-CM | POA: Diagnosis not present

## 2018-11-05 ENCOUNTER — Other Ambulatory Visit: Payer: Self-pay | Admitting: Cardiology

## 2018-11-21 ENCOUNTER — Other Ambulatory Visit: Payer: Self-pay | Admitting: Family Medicine

## 2018-11-21 DIAGNOSIS — G4700 Insomnia, unspecified: Secondary | ICD-10-CM

## 2018-11-21 NOTE — Telephone Encounter (Signed)
Requested medication (s) are due for refill today -yes  Requested medication (s) are on the active medication list -yes  Future visit scheduled -no  Last refill: 09/07/17  Notes to clinic: per protocol patient needs to be seen every 6 months for refills- call to patient to schedule appointment for medication follow up- patient states he has been on this Rx for 10-12 years- he does not feel he needs an appointment to come in and state the medication is working and he is not depressed. Told patient would send his request for refill to PCP.   Requested Prescriptions  Pending Prescriptions Disp Refills   buPROPion (WELLBUTRIN SR) 200 MG 12 hr tablet [Pharmacy Med Name: BUPROPION SR 200MG  TABLETS (12 HR)] 360 tablet     Sig: TAKE 1 TABLET BY MOUTH TWICE DAILY     Psychiatry: Antidepressants - bupropion Failed - 11/21/2018 10:57 AM      Failed - Valid encounter within last 6 months    Recent Outpatient Visits          8 months ago Cough   [facility], Ranell Patrick, MD   8 months ago Cough   [facility], Ranell Patrick, MD   9 months ago Cough   [facility], Ranell Patrick, MD   9 months ago Palpitations   [facility], [place], [place]   10 months ago Sleep-related breathing disorder   [facility], Ranell Patrick, MD             Passed - Last BP in normal range    BP Readings from Last 1 Encounters:  10/05/18 126/77          Requested Prescriptions  Pending Prescriptions Disp Refills   buPROPion (WELLBUTRIN SR) 200 MG 12 hr tablet [Pharmacy Med Name: BUPROPION SR 200MG  TABLETS (12 HR)] 360 tablet     Sig: TAKE 1 TABLET BY MOUTH TWICE DAILY     Psychiatry: Antidepressants - bupropion Failed - 11/21/2018 10:57 AM      Failed - Valid encounter within last 6 months    Recent Outpatient Visits          8 months ago Cough   [facility], Ranell Patrick, MD   8 months ago Cough   [facility], Ranell Patrick, MD   9 months ago Cough   [facility], Ranell Patrick, MD   9 months ago Palpitations   [facility], [place], [place]   10 months ago Sleep-related breathing disorder   [facility] [place], [place]             Passed - Last BP in normal range    BP Readings from Last 1 Encounters:  10/05/18 126/77

## 2018-11-24 DIAGNOSIS — D2261 Melanocytic nevi of right upper limb, including shoulder: Secondary | ICD-10-CM | POA: Diagnosis not present

## 2018-11-24 DIAGNOSIS — D2239 Melanocytic nevi of other parts of face: Secondary | ICD-10-CM | POA: Diagnosis not present

## 2018-11-24 DIAGNOSIS — D225 Melanocytic nevi of trunk: Secondary | ICD-10-CM | POA: Diagnosis not present

## 2018-11-24 DIAGNOSIS — L821 Other seborrheic keratosis: Secondary | ICD-10-CM | POA: Diagnosis not present

## 2018-11-24 DIAGNOSIS — L820 Inflamed seborrheic keratosis: Secondary | ICD-10-CM | POA: Diagnosis not present

## 2018-11-24 DIAGNOSIS — L570 Actinic keratosis: Secondary | ICD-10-CM | POA: Diagnosis not present

## 2018-11-24 DIAGNOSIS — D2262 Melanocytic nevi of left upper limb, including shoulder: Secondary | ICD-10-CM | POA: Diagnosis not present

## 2018-12-05 ENCOUNTER — Other Ambulatory Visit: Payer: Self-pay

## 2018-12-05 ENCOUNTER — Encounter: Payer: Self-pay | Admitting: Family Medicine

## 2018-12-05 ENCOUNTER — Ambulatory Visit (INDEPENDENT_AMBULATORY_CARE_PROVIDER_SITE_OTHER): Payer: Medicare Other | Admitting: Family Medicine

## 2018-12-05 DIAGNOSIS — G4700 Insomnia, unspecified: Secondary | ICD-10-CM | POA: Diagnosis not present

## 2018-12-05 MED ORDER — BUPROPION HCL ER (SR) 200 MG PO TB12: 400.0000 mg | ORAL_TABLET | Freq: Every day | ORAL | 1 refills | Status: DC

## 2018-12-05 NOTE — Progress Notes (Signed)
Subjective:    Patient ID: Joseph Maldonado., male    DOB: 04/15/1946, 73 y.o.   MRN: [id number]  HPI Joseph Maldonado. is a 73 y.o. male Presents today for: Chief Complaint  Patient presents with  . Depression    need a refill on bupropion 200mg    Depression:  Depression screen [facility] 2/9 12/05/2018 03/13/2018 03/03/2018 02/13/2018 01/28/2018  Decreased Interest 0 0 0 0 0  Down, Depressed, Hopeless 0 0 0 0 0  PHQ - 2 Score 0 0 0 0 0  on Wellbutrin 200mg  - 2 each morning. Feels like this dosing gave him more energy, but on CPAP now which is helping. Plans to try BID now. Does not want to decrease dose at this point.   Patient Active Problem List   Diagnosis Date Noted  . History of total hip arthroplasty, right 08/14/2018  . Osteoarthritis of right hip 08/12/2018  . Palpitations 01/29/2018  . SCC (squamous cell carcinoma) 01/28/2018  . Solitary pulmonary nodule 10/03/2016  . Cough 09/29/2016  . Seborrheic keratosis 02/26/2014  . Rotator cuff tear arthropathy of right shoulder 01/10/2014  . 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 S/P total knee arthroplasty 10/05/2011  . 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 OA (osteoarthritis) of knee 03/01/2011  . CKD (chronic kidney disease) 05/13/2010   Past Medical History:  Diagnosis Date  . Arthritis   . CAD (coronary artery disease)   . Chronic kidney disease   . Complication of anesthesia    reports " i lose my blood pressure after anesthesia; the drops usually happens in recovery"   . Depression   . Hand pain    currently being evaluated by rheumatology for dx   . Hematuria   . Hypertension   . Rapid heart beat    rpeorts  " 2 months ago i had a rapid heart beat that lasted 45 min to an hour, ive experience this twice in the last t10 years but only seconds long" " i was evaluated by cardiology Dr Landry Mellow who did a 30 day monitor and ECHO and said i have occasional [PATS] ? , denies chest pain nor LOC   during these episodes; HR today is WDL   . Sleep apnea    CPAP    Past Surgical History:  Procedure Laterality Date  . CATARACT EXTRACTION, BILATERAL    . HIP SURGERY Left 2012  . JOINT REPLACEMENT    . REPLACEMENT TOTAL KNEE Right    2001,2006,2009,2013  . REPLACEMENT TOTAL KNEE Left 2011  . TOTAL HIP ARTHROPLASTY Right 08/14/2018   Procedure: TOTAL HIP ARTHROPLASTY ANTERIOR APPROACH;  Surgeon: Frederik Pear, MD;  Location: [facility];  Service: Orthopedics;  Laterality: Right;   Allergies  Allergen Reactions  . Nsaids     Affects creatinine  . Dilaudid [Hydromorphone Hcl] Other (See Comments)    Suicidal   Prior to Admission medications   Medication Sig Start Date End Date Taking? Authorizing Provider  aspirin EC 81 MG tablet Take 1 tablet (81 mg total) by mouth 2 (two) times daily. 08/14/18  Yes Joanell Rising K, PA-C  atorvastatin (LIPITOR) 20 MG tablet TAKE 1 TABLET BY MOUTH DAILY 11/07/18  Yes Patwardhan, Manish J, MD  buPROPion (WELLBUTRIN SR) 200 MG 12 hr tablet TAKE 1 TABLET TWICE A DAY Patient taking differently: Take 400 mg by mouth daily.  09/07/17  Yes Wendie Agreste, MD  losartan (COZAAR) 100 MG tablet TAKE 1 TABLET  BY MOUTH EVERY DAY 09/16/18  Yes Wendie Agreste, MD   Social History   Socioeconomic History  . Marital status: Married    Spouse name: Not on file  . Number of children: Not on file  . Years of education: Not on file  . Highest education level: Bachelor's degree (e.g., BA, AB, BS)  Occupational History  . Occupation: Retired  Scientific laboratory technician  . Financial resource strain: Not hard at all  . Food insecurity:    Worry: Never true    Inability: Never true  . Transportation needs:    Medical: No    Non-medical: No  Tobacco Use  . Smoking status: Former Smoker    Types: Cigarettes    Last attempt to quit: 1971    Years since quitting: 49.2  . Smokeless tobacco: Never Used  Substance and Sexual Activity  . Alcohol use: Yes    Alcohol/week: 3.0  standard drinks    Types: 3 Cans of beer per week    Comment: max 3 beers/week  . Drug use: No    Comment: former user of marijuana, quit in 1971  . Sexual activity: Yes  Lifestyle  . Physical activity:    Days per week: 0 days    Minutes per session: 0 min  . Stress: Not at all  Relationships  . Social connections:    Talks on phone: More than three times a week    Gets together: Once a week    Attends religious service: More than 4 times per year    Active member of club or organization: Yes    Attends meetings of clubs or organizations: More than 4 times per year    Relationship status: Married  . Intimate partner violence:    Fear of current or ex partner: No    Emotionally abused: No    Physically abused: No    Forced sexual activity: No  Other Topics Concern  . Not on file  Social History Narrative   Lives with his wife.   Education: College   Exercise: No   Drinks about 2 large coffees per day   Right handed      Spent many years in [place].  Moved to [place] to be near their children.  Daughter lives in [place].  Their son has lived in the [place] area with plans to move to [place] in summer 2019.    Review of Systems     Objective:   Physical Exam Vitals signs reviewed.  Constitutional:      Appearance: He is well-developed.  HENT:     Head: Normocephalic and atraumatic.  Eyes:     Pupils: Pupils are equal, round, and reactive to light.  Neck:     Vascular: No carotid bruit or JVD.  Cardiovascular:     Rate and Rhythm: Normal rate and regular rhythm.     Heart sounds: Normal heart sounds. No murmur.  Pulmonary:     Effort: Pulmonary effort is normal.     Breath sounds: Normal breath sounds. No rales.  Skin:    General: Skin is warm and dry.  Neurological:     Mental Status: He is alert and oriented to person, place, and time.  Psychiatric:        Mood and Affect: Mood normal.        Behavior: Behavior normal.        Thought Content: Thought  content normal.    Vitals:   12/05/18 0954  BP: 122/68  Pulse: 79  Resp: 16  Temp: 97.6 F (36.4 C)  TempSrc: Oral  SpO2: 100%  Weight: 217 lb 9.6 oz (98.7 kg)  Height: 5\' 10"  (1.778 m)       Assessment & Plan:   Joseph Maldonado. is a 73 y.o. male Insomnia, unspecified type - Plan: buPROPion (WELLBUTRIN SR) 200 MG 12 hr tablet  -Tolerating current dose of Wellbutrin to 400 mg daily for depression symptoms.  Offered to try decrease to 150 mg twice daily but denies any current side effects and tolerating daily dosing at this time.  Continue same, but option of lower dose if he would like and can do so without office visit.  Meds ordered this encounter  Medications  . buPROPion (WELLBUTRIN SR) 200 MG 12 hr tablet    Sig: Take 2 tablets (400 mg total) by mouth daily.    Dispense:  180 tablet    Refill:  1   Patient Instructions     No change in medications for now, however if you would like to try a lower dose of Wellbutrin, let me know and I can send that in.    If you have lab work done today you will be contacted with your lab results within the next 2 weeks.  If you have not heard from [name] then please contact us. The fastest way to get your results is to register for My Chart.   IF you received an x-ray today, you will receive an invoice from [facility]. Please contact [facility] at [phone] with questions or concerns regarding your invoice.   IF you received labwork today, you will receive an invoice from [facility]. Please contact [facility] at [phone] with questions or concerns regarding your invoice.   Our billing staff will not be able to assist you with questions regarding bills from these companies.  You will be contacted with the lab results as soon as they are available. The fastest way to get your results is to activate your My Chart account. Instructions are located on the last page of this paperwork. If you have not heard from [name]  regarding the results in 2 weeks, please contact this office.       Signed,   [name], MD [facility].  12/05/18 12:53 PM

## 2018-12-05 NOTE — Patient Instructions (Addendum)
   No change in medications for now, however if you would like to try a lower dose of Wellbutrin, let me know and I can send that in.    If you have lab work done today you will be contacted with your lab results within the next 2 weeks.  If you have not heard from [name] then please contact us. The fastest way to get your results is to register for My Chart.   IF you received an x-ray today, you will receive an invoice from [facility]. Please contact [facility] at [phone] with questions or concerns regarding your invoice.   IF you received labwork today, you will receive an invoice from [facility]. Please contact [facility] at [phone] with questions or concerns regarding your invoice.   Our billing staff will not be able to assist you with questions regarding bills from these companies.  You will be contacted with the lab results as soon as they are available. The fastest way to get your results is to activate your My Chart account. Instructions are located on the last page of this paperwork. If you have not heard from [name] regarding the results in 2 weeks, please contact this office.

## 2018-12-15 ENCOUNTER — Other Ambulatory Visit: Payer: Self-pay | Admitting: Family Medicine

## 2019-01-04 ENCOUNTER — Other Ambulatory Visit: Payer: Self-pay

## 2019-01-04 ENCOUNTER — Ambulatory Visit (INDEPENDENT_AMBULATORY_CARE_PROVIDER_SITE_OTHER): Payer: Medicare Other | Admitting: Family Medicine

## 2019-01-04 VITALS — Ht 70.5 in | Wt 206.0 lb

## 2019-01-04 DIAGNOSIS — Z0000 Encounter for general adult medical examination without abnormal findings: Secondary | ICD-10-CM | POA: Diagnosis not present

## 2019-01-04 NOTE — Patient Instructions (Signed)
Thank you for taking time to come for your Medicare Wellness Visit. I appreciate your ongoing commitment to your health goals. Please review the following plan we discussed and let me know if I can assist you in the future.  Cristy Colmenares LPN  Healthy Eating Following a healthy eating pattern may help you to achieve and maintain a healthy body weight, reduce the risk of chronic disease, and live a long and productive life. It is important to follow a healthy eating pattern at an appropriate calorie level for your body. Your nutritional needs should be met primarily through food by choosing a variety of nutrient-rich foods. What are tips for following this plan? Reading food labels  Read labels and choose the following: ? Reduced or low sodium. ? Juices with 100% fruit juice. ? Foods with low saturated fats and high polyunsaturated and monounsaturated fats. ? Foods with whole grains, such as whole wheat, cracked wheat, brown rice, and wild rice. ? Whole grains that are fortified with folic acid. This is recommended for women who are pregnant or who want to become pregnant.  Read labels and avoid the following: ? Foods with a lot of added sugars. These include foods that contain brown sugar, corn sweetener, corn syrup, dextrose, fructose, glucose, high-fructose corn syrup, honey, invert sugar, lactose, malt syrup, maltose, molasses, raw sugar, sucrose, trehalose, or turbinado sugar.  Do not eat more than the following amounts of added sugar per day:  6 teaspoons (25 g) for women.  9 teaspoons (38 g) for men. ? Foods that contain processed or refined starches and grains. ? Refined grain products, such as white flour, degermed cornmeal, white bread, and white rice. Shopping  Choose nutrient-rich snacks, such as vegetables, whole fruits, and nuts. Avoid high-calorie and high-sugar snacks, such as potato chips, fruit snacks, and candy.  Use oil-based dressings and spreads on foods instead of  solid fats such as butter, stick margarine, or cream cheese.  Limit pre-made sauces, mixes, and "instant" products such as flavored rice, instant noodles, and ready-made pasta.  Try more plant-protein sources, such as tofu, tempeh, black beans, edamame, lentils, nuts, and seeds.  Explore eating plans such as the Mediterranean diet or vegetarian diet. Cooking  Use oil to saut or stir-fry foods instead of solid fats such as butter, stick margarine, or lard.  Try baking, boiling, grilling, or broiling instead of frying.  Remove the fatty part of meats before cooking.  Steam vegetables in water or broth. Meal planning   At meals, imagine dividing your plate into fourths: ? One-half of your plate is fruits and vegetables. ? One-fourth of your plate is whole grains. ? One-fourth of your plate is protein, especially lean meats, poultry, eggs, tofu, beans, or nuts.  Include low-fat dairy as part of your daily diet. Lifestyle  Choose healthy options in all settings, including home, work, school, restaurants, or stores.  Prepare your food safely: ? Wash your hands after handling raw meats. ? Keep food preparation surfaces clean by regularly washing with hot, soapy water. ? Keep raw meats separate from ready-to-eat foods, such as fruits and vegetables. ? Cook seafood, meat, poultry, and eggs to the recommended internal temperature. ? Store foods at safe temperatures. In general:  Keep cold foods at 40F (4.4C) or below.  Keep hot foods at 140F (60C) or above.  Keep your freezer at 0F (-17.8C) or below.  Foods are no longer safe to eat when they have been between the temperatures of 40-140F (4.4-60C) for   eat when they have been between the temperatures of 40-140F (4.4-60C) for more than 2 hours. What foods should I eat? Fruits Aim to eat 2 cup-equivalents of fresh, canned (in natural juice), or frozen fruits each day. Examples of 1 cup-equivalent of fruit include 1 small apple, 8 large strawberries, 1 cup canned fruit,   cup dried fruit, or 1 cup 100% juice. Vegetables Aim to eat 2-3 cup-equivalents of fresh and frozen vegetables each day, including different varieties and colors. Examples of 1 cup-equivalent of vegetables include 2 medium carrots, 2 cups raw, leafy greens, 1 cup chopped vegetable (raw or cooked), or 1 medium baked potato. Grains Aim to eat 6 ounce-equivalents of whole grains each day. Examples of 1 ounce-equivalent of grains include 1 slice of bread, 1 cup ready-to-eat cereal, 3 cups popcorn, or  cup cooked rice, pasta, or cereal. Meats and other proteins Aim to eat 5-6 ounce-equivalents of protein each day. Examples of 1 ounce-equivalent of protein include 1 egg, 1/2 cup nuts or seeds, or 1 tablespoon (16 g) peanut butter. A cut of meat or fish that is the size of a deck of cards is about 3-4 ounce-equivalents.  Of the protein you eat each week, try to have at least 8 ounces come from seafood. This includes salmon, trout, herring, and anchovies. Dairy Aim to eat 3 cup-equivalents of fat-free or low-fat dairy each day. Examples of 1 cup-equivalent of dairy include 1 cup (240 mL) milk, 8 ounces (250 g) yogurt, 1 ounces (44 g) natural cheese, or 1 cup (240 mL) fortified soy milk. Fats and oils  Aim for about 5 teaspoons (21 g) per day. Choose monounsaturated fats, such as canola and olive oils, avocados, peanut butter, and most nuts, or polyunsaturated fats, such as sunflower, corn, and soybean oils, walnuts, pine nuts, sesame seeds, sunflower seeds, and flaxseed. Beverages  Aim for six 8-oz glasses of water per day. Limit coffee to three to five 8-oz cups per day.  Limit caffeinated beverages that have added calories, such as soda and energy drinks.  Limit alcohol intake to no more than 1 drink a day for nonpregnant women and 2 drinks a day for men. One drink equals 12 oz of beer (355 mL), 5 oz of wine (148 mL), or 1 oz of hard liquor (44 mL). Seasoning and other foods  Avoid adding  excess amounts of salt to your foods. Try flavoring foods with herbs and spices instead of salt.  Avoid adding sugar to foods.  Try using oil-based dressings, sauces, and spreads instead of solid fats. This information is based on general [place] nutrition guidelines. For more information, visit choosemyplate.gov. Exact amounts may vary based on your nutrition needs. Summary  A healthy eating plan may help you to maintain a healthy weight, reduce the risk of chronic diseases, and stay active throughout your life.  Plan your meals. Make sure you eat the right portions of a variety of nutrient-rich foods.  Try baking, boiling, grilling, or broiling instead of frying.  Choose healthy options in all settings, including home, work, school, restaurants, or stores. This information is not intended to replace advice given to you by your health care provider. Make sure you discuss any questions you have with your health care provider. Document Released: 12/26/2017 Document Revised: 12/26/2017 Document Reviewed: 12/26/2017 Elsevier Interactive Patient Education  2019 [employer] Advance Directive  Advance directives are legal documents that let you make choices ahead of time about your health care and medical treatment in case   you become unable to communicate for yourself. Advance directives are a way for you to communicate your wishes to family, friends, and health care providers. This can help convey your decisions about end-of-life care if you become unable to communicate. Discussing and writing advance directives should happen over time rather than all at once. Advance directives can be changed depending on your situation and what you want, even after you have signed the advance directives. If you do not have an advance directive, some states assign family decision makers to act on your behalf based on how closely you are related to them. Each state has its own laws regarding advance directives. You  may want to check with your health care provider, attorney, or state representative about the laws in your state. There are different types of advance directives, such as:  Medical power of attorney.  Living will.  Do not resuscitate (DNR) or do not attempt resuscitation (DNAR) order. Health care proxy and medical power of attorney A health care proxy, also called a health care agent, is a person who is appointed to make medical decisions for you in cases in which you are unable to make the decisions yourself. Generally, people choose someone they know well and trust to represent their preferences. Make sure to ask this person for an agreement to act as your proxy. A proxy may have to exercise judgment in the event of a medical decision for which your wishes are not known. A medical power of attorney is a legal document that names your health care proxy. Depending on the laws in your state, after the document is written, it may also need to be:  Signed.  Notarized.  Dated.  Copied.  Witnessed.  Incorporated into your medical record. You may also want to appoint someone to manage your financial affairs in a situation in which you are unable to do so. This is called a durable power of attorney for finances. It is a separate legal document from the durable power of attorney for health care. You may choose the same person or someone different from your health care proxy to act as your agent in financial matters. If you do not appoint a proxy, or if there is a concern that the proxy is not acting in your best interests, a court-appointed guardian may be designated to act on your behalf. Living will A living will is a set of instructions documenting your wishes about medical care when you cannot express them yourself. Health care providers should keep a copy of your living will in your medical record. You may want to give a copy to family members or friends. To alert caregivers in case of an  emergency, you can place a card in your wallet to let them know that you have a living will and where they can find it. A living will is used if you become:  Terminally ill.  Incapacitated.  Unable to communicate or make decisions. Items to consider in your living will include:  The use or non-use of life-sustaining equipment, such as dialysis machines and breathing machines (ventilators).  A DNR or DNAR order, which is the instruction not to use cardiopulmonary resuscitation (CPR) if breathing or heartbeat stops.  The use or non-use of tube feeding.  Withholding of food and fluids.  Comfort (palliative) care when the goal becomes comfort rather than a cure.  Organ and tissue donation. A living will does not give instructions for distributing your money and property if you should   pass away. It is recommended that you seek the advice of a lawyer when writing a will. Decisions about taxes, beneficiaries, and asset distribution will be legally binding. This process can relieve your family and friends of any concerns surrounding disputes or questions that may come up about the distribution of your assets. DNR or DNAR A DNR or DNAR order is a request not to have CPR in the event that your heart stops beating or you stop breathing. If a DNR or DNAR order has not been made and shared, a health care provider will try to help any patient whose heart has stopped or who has stopped breathing. If you plan to have surgery, talk with your health care provider about how your DNR or DNAR order will be followed if problems occur. Summary  Advance directives are the legal documents that allow you to make choices ahead of time about your health care and medical treatment in case you become unable to communicate for yourself.  The process of discussing and writing advance directives should happen over time. You can change the advance directives, even after you have signed them.  Advance directives include  DNR or DNAR orders, living wills, and designating an agent as your medical power of attorney. This information is not intended to replace advice given to you by your health care provider. Make sure you discuss any questions you have with your health care provider. Document Released: 12/21/2007 Document Revised: 08/02/2016 Document Reviewed: 08/02/2016 Elsevier Interactive Patient Education  2019 [employer]  

## 2019-01-04 NOTE — Progress Notes (Signed)
Presents today for [employer] Visit   Date of last exam: 12/05/2018  Interpreter used for this visit? NO  Patient Care Team: Wendie Agreste, MD as PCP - General (Family Medicine) Frederik Pear, MD as Consulting Physician (Orthopedic Surgery) Rosita Fire, MD as Consulting Physician (Nephrology)   Other items to address today:  Importance of social distancing, wearing masks when out.    ADVANCE DIRECTIVES: Discussed: yes On File: no Materials Provided: yes (mailed)  Immunization status:  Immunization History  Administered Date(s) Administered  . Influenza, High Dose Seasonal PF 07/26/2018  . Influenza,inj,Quad PF,6+ Mos 07/07/2016  . Influenza-Unspecified 07/13/2012, 10/01/2015, 07/05/2017  . Pneumococcal Conjugate-13 04/10/2014  . Pneumococcal Polysaccharide-23 12/17/2015  . Pneumococcal-Unspecified 09/27/2010  . Tdap 02/09/2015  . Zoster 09/27/2010  . Zoster Recombinat (Shingrix) 02/04/2017, 06/28/2017     Health Maintenance Due  Topic Date Due  . Hepatitis C Screening  1945-12-09     Functional Status Survey: Is the patient deaf or have difficulty hearing?: Yes(hearing aids both ears) Does the patient have difficulty seeing, even when wearing glasses/contacts?: No Does the patient have difficulty concentrating, remembering, or making decisions?: No Does the patient have difficulty walking or climbing stairs?: No Does the patient have difficulty dressing or bathing?: No Does the patient have difficulty doing errands alone such as visiting a doctor's office or shopping?: No   6CIT Screen 01/04/2019 08/08/2017  What Year? 0 points 0 points  What month? 0 points 0 points  What time? 0 points 0 points  Count back from 20 0 points 0 points  Months in reverse 0 points 0 points  Repeat phrase 0 points 0 points  Total Score 0 0        Clinical Support from 01/04/2019 in [facility]  AUDIT-C Score  7       Home  Environment:  Live in a twp story home with wife two dogs and cat.  No scattered rugs No grabs bars House is well lit.   Patient Active Problem List   Diagnosis Date Noted  . History of total hip arthroplasty, right 08/14/2018  . Osteoarthritis of right hip 08/12/2018  . Palpitations 01/29/2018  . SCC (squamous cell carcinoma) 01/28/2018  . Solitary pulmonary nodule 10/03/2016  . Cough 09/29/2016  . Seborrheic keratosis 02/26/2014  . Rotator cuff tear arthropathy of right shoulder 01/10/2014  . 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 S/P total knee arthroplasty 10/05/2011  . 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 OA (osteoarthritis) of knee 03/01/2011  . CKD (chronic kidney disease) 05/13/2010     Past Medical History:  Diagnosis Date  . Arthritis   . CAD (coronary artery disease)   . Chronic kidney disease   . Complication of anesthesia    reports " i lose my blood pressure after anesthesia; the drops usually happens in recovery"   . Depression   . Hand pain    currently being evaluated by rheumatology for dx   . Hematuria   . Hypertension   . Rapid heart beat    rpeorts  " 2 months ago i had a rapid heart beat that lasted 45 min to an hour, ive experience this twice in the last t10 years but only seconds long" " i was evaluated by cardiology Dr Landry Mellow who did a 30 day monitor and ECHO and said i have occasional [PATS] ? , denies chest pain nor LOC  during these episodes; HR today is WDL   . Sleep apnea    CPAP      Past Surgical History:  Procedure Laterality Date  . CATARACT EXTRACTION, BILATERAL    . HIP SURGERY Left 2012  . JOINT REPLACEMENT    . REPLACEMENT TOTAL KNEE Right    2001,2006,2009,2013  . REPLACEMENT TOTAL KNEE Left 2011  . TOTAL HIP ARTHROPLASTY Right 08/14/2018   Procedure: TOTAL HIP ARTHROPLASTY ANTERIOR APPROACH;  Surgeon: Frederik Pear, MD;  Location: [facility];  Service: Orthopedics;  Laterality: Right;     Family  History  Problem Relation Age of Onset  . Cancer Mother        throat  . Hypertension Mother   . Heart disease Mother   . Emphysema Mother   . Hypertension Father   . Cancer Father        Brain  . Hypertension Sister   . Hematuria Sister   . Hematuria Brother      Social History   Socioeconomic History  . Marital status: Married    Spouse name: Not on file  . Number of children: Not on file  . Years of education: Not on file  . Highest education level: Bachelor's degree (e.g., BA, AB, BS)  Occupational History  . Occupation: Retired  Scientific laboratory technician  . Financial resource strain: Not hard at all  . Food insecurity:    Worry: Never true    Inability: Never true  . Transportation needs:    Medical: No    Non-medical: No  Tobacco Use  . Smoking status: Former Smoker    Types: Cigarettes    Last attempt to quit: 1971    Years since quitting: 49.3  . Smokeless tobacco: Never Used  Substance and Sexual Activity  . Alcohol use: Yes    Alcohol/week: 3.0 standard drinks    Types: 3 Cans of beer per week    Comment: max 3 beers/week  . Drug use: No    Comment: former user of marijuana, quit in 1971  . Sexual activity: Yes  Lifestyle  . Physical activity:    Days per week: 0 days    Minutes per session: 0 min  . Stress: Not at all  Relationships  . Social connections:    Talks on phone: More than three times a week    Gets together: Once a week    Attends religious service: More than 4 times per year    Active member of club or organization: Yes    Attends meetings of clubs or organizations: More than 4 times per year    Relationship status: Married  . Intimate partner violence:    Fear of current or ex partner: No    Emotionally abused: No    Physically abused: No    Forced sexual activity: No  Other Topics Concern  . Not on file  Social History Narrative   Lives with his wife.   Education: College   Exercise: No   Drinks about 2 large coffees per day    Right handed      Spent many years in [place].  Moved to [place] to be near their children.  Daughter lives in [place].  Their son has lived in the [place] area with plans to move to [place] in summer 2019.     Allergies  Allergen Reactions  . Nsaids     Affects creatinine  . Dilaudid [Hydromorphone Hcl] Other (See Comments)    Suicidal  Prior to Admission medications   Medication Sig Start Date End Date Taking? Authorizing Provider  aspirin EC 81 MG tablet Take 1 tablet (81 mg total) by mouth 2 (two) times daily. 08/14/18  Yes Joanell Rising K, PA-C  atorvastatin (LIPITOR) 20 MG tablet TAKE 1 TABLET BY MOUTH DAILY 11/07/18  Yes Patwardhan, Manish J, MD  buPROPion (WELLBUTRIN SR) 200 MG 12 hr tablet Take 2 tablets (400 mg total) by mouth daily. 12/05/18  Yes Wendie Agreste, MD  losartan (COZAAR) 100 MG tablet TAKE 1 TABLET BY MOUTH EVERY DAY 12/17/18  Yes Wendie Agreste, MD  cholecalciferol (VITAMIN D3) 25 MCG (1000 UT) tablet Take 1,000 Units by mouth daily.    [provider]     Depression screen [facility] 2/9 01/04/2019 12/05/2018 03/13/2018 03/03/2018 02/13/2018  Decreased Interest 0 0 0 0 0  Down, Depressed, Hopeless 0 0 0 0 0  PHQ - 2 Score 0 0 0 0 0     Fall Risk  01/04/2019 12/05/2018 03/13/2018 03/03/2018 02/13/2018  Falls in the past year? 1 0 No No No  Comment tripped on rug,tripped outside - - - -  Number falls in past yr: 1 0 - - -  Injury with Fall? 1 0 - - -  Comment - - - - -  Risk Factor Category  - - - - -  Risk for fall due to : - - - - -  Risk for fall due to: Comment - - - - -  Follow up - Falls evaluation completed - - -      PHYSICAL EXAM: Ht 5' 10.5" (1.791 m)   Wt 206 lb (93.4 kg) Comment: patient states at home weight  BMI 29.14 kg/m    Wt Readings from Last 3 Encounters:  01/04/19 206 lb (93.4 kg)  12/05/18 217 lb 9.6 oz (98.7 kg)  10/05/18 212 lb (96.2 kg)     No exam data present    Physical Exam   Education/Counseling  provided regarding diet and exercise, prevention of chronic diseases, smoking/tobacco cessation, if applicable, and reviewed "Covered Medicare Preventive Services."   ASSESSMENT/PLAN: 1. Medicare annual wellness visit, subsequent  Other orders - cholecalciferol (VITAMIN D3) 25 MCG (1000 UT) tablet; Take 1,000 Units by mouth daily.     Review of Systems:  N/A Cardiac Risk Factors include: advanced age (>37men, >3 women);dyslipidemia;hypertension;obesity (BMI >30kg/m2);male gender  Screening recommendations/referrals: Colonoscopy: up to date, next due 09/28/2023 Recommended yearly ophthalmology/optometry visit for glaucoma screening and checkup Recommended yearly dental visit for hygiene and checkup  Vaccinations: Influenza vaccine: up to date Pneumococcal vaccine: up to date Tdap vaccine: up to date, next due 02/08/2025 Shingles vaccine: up to date     Exercise mows lawn, no regiment. States he has fallen no injuries, trips over feet.

## 2019-01-14 DIAGNOSIS — L03115 Cellulitis of right lower limb: Secondary | ICD-10-CM | POA: Diagnosis not present

## 2019-01-15 ENCOUNTER — Other Ambulatory Visit: Payer: Self-pay | Admitting: Cardiology

## 2019-01-15 DIAGNOSIS — I77810 Thoracic aortic ectasia: Secondary | ICD-10-CM

## 2019-01-18 DIAGNOSIS — M1611 Unilateral primary osteoarthritis, right hip: Secondary | ICD-10-CM | POA: Diagnosis not present

## 2019-03-05 ENCOUNTER — Other Ambulatory Visit: Payer: Self-pay

## 2019-03-05 MED ORDER — ATORVASTATIN CALCIUM 20 MG PO TABS: 20.0000 mg | ORAL_TABLET | Freq: Every day | ORAL | 1 refills | Status: DC

## 2019-03-21 ENCOUNTER — Other Ambulatory Visit: Payer: Self-pay | Admitting: Internal Medicine

## 2019-03-21 DIAGNOSIS — R911 Solitary pulmonary nodule: Secondary | ICD-10-CM

## 2019-04-02 ENCOUNTER — Telehealth: Payer: Self-pay | Admitting: Neurology

## 2019-04-02 NOTE — Telephone Encounter (Signed)
I called pt. He wants to do a virtual visit.  Pt understands that although there may be some limitations with this type of visit, we will take all precautions to reduce any security or privacy concerns.  Pt understands that this will be treated like an in office visit and we will file with pt's insurance, and there may be a patient responsible charge related to this service.  Pt's meds, allergies, and PMH were updated.  Pt reports that his cpap is going well.

## 2019-04-02 NOTE — Telephone Encounter (Signed)
Pt has called asking if he can do a virtual with Dr Rexene Alberts instead of an in office appointment.  Since the appointment is with Dr and not NP, pt was told a message would be sent to RN asking if he could have a virtual visit with NP or if it needed to remain as an in office with Dr Rexene Alberts, please call.

## 2019-04-03 ENCOUNTER — Encounter: Payer: Self-pay | Admitting: Neurology

## 2019-04-05 ENCOUNTER — Encounter: Payer: Self-pay | Admitting: Neurology

## 2019-04-05 ENCOUNTER — Telehealth (INDEPENDENT_AMBULATORY_CARE_PROVIDER_SITE_OTHER): Payer: Medicare Other | Admitting: Neurology

## 2019-04-05 DIAGNOSIS — Z9989 Dependence on other enabling machines and devices: Secondary | ICD-10-CM | POA: Diagnosis not present

## 2019-04-05 DIAGNOSIS — G4733 Obstructive sleep apnea (adult) (pediatric): Secondary | ICD-10-CM

## 2019-04-05 NOTE — Progress Notes (Signed)
Order for cpap supplies sent to [employer] via community message. Confirmation received that the order transmitted was successful.  

## 2019-04-05 NOTE — Progress Notes (Signed)
Interim history:  Joseph Maldonado is a 73 year old right-handed gentleman with an underlying medical history of arthritis, chronic kidney disease, depression, hypertension, reflux disease, chronic cough and overweight state, who presents for a virtual, video based appointment via [facility] video visit for follow-up consultation of his obstructive sleep apnea, established on CPAP therapy.  The patient is unaccompanied today and joins via cell ph from home, I am located in my office.  I last saw him on 10/05/2018, at which time we talked about his sleep study results, he was compliant with CPAP and doing well, he felt improved with regards to his daytime energy level and nighttime sleep consolidation and sleep quality.  He had undergone right total hip replacement in November 2018 and had to cancel an interim follow-up appointment because of his surgery and also his dog needed surgery.  He had lost weight over time and was able to keep it off.  He was using a fullface mask but had tried another type of mask in the interim.    Today, 04/05/2019: Please also see below for virtual visit documentation.  I reviewed his CPAP compliance data from 03/05/2019 through 04/03/2019 which is a total of 30 days, during which time he used his machine every night with percent use days greater than 4 hours at 90%, indicating excellent compliance with an average usage of 6 hours and 53 minutes, residual AHI borderline at 4.8/h, leak on the higher end with a 95th percentile at 19.9 L/min on a pressure of 12 cm with EPR of 1.    The patient's allergies, current medications, family history, past medical history, past social history, past surgical history and problem list were reviewed and updated as appropriate.    Previously:    I first met him on 04/06/2018 at the request of his primary care physician, at which time he reported snoring and daytime somnolence. He was advised to proceed with a sleep study. He had a split-night sleep study  on 05/22/2018. I went over his test results with him in detail today. Baseline sleep latency was delayed at 91.5 minutes and REM sleep was absent. He had a total AHI of 52.2 per hour, average oxygen saturation was 96%, nadir was 78%. He had no significant PLMS, no significant EKG changes. He was fitted with a large full facemask due to being a mouth breather and CPAP was titrated from 5 cm to 12 cm. On the final pressure his AHI was 0 per hour, with supine non-REM sleep achieved an O2 nadir of 95%. Based on his test results I prescribed CPAP therapy for home use at a pressure of 12 cm.    I reviewed his CPAP compliance data from 09/04/2018 through 10/03/2018 which is a total of 30 days, during which time he used his CPAP every night with percent used days greater than 4 hours at 97%, indicating excellent compliance with an average usage of 7 hours and 17 minutes, residual AHI at goal at 3.8 per hour, leak on the higher end with the 95th percentile at 23.7 L/m on a pressure of 12 cm with EPR of 1.   04/06/2018: (He) reports snoring and excessive daytime somnolence. His wife is noted pauses in his breathing while asleep. He saw Dr. Jaynee Eagles last year after a fall. I reviewed your office note from 01/24/2018 as well as 03/13/2018. He recently saw pulmonology for his chronic cough. His Epworth sleepiness score is 3 out of 24, fatigue score is 32 out of 63. He  denies a family history of sleep apnea. He reports a family history of brain aneurysm in his mother and brain cancer in his father. He would like to have a brain MRI done but cannot have contrast because of chronic kidney disease. For his chronic cough he was recently started on medication by pulmonology including anti-histamine and reflux medicine. His wife reports that there are some nights where he snores very little and has no apneas but there are some other nights where he has louder snoring and breathing pauses as well as gasping sounds. He has nocturia  about 2-3 times per average night and denies morning headaches. 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 They have 2 grown children, a daughter and a son. Wife reports that his family and she have noticed changes in his behavior and low frustration tolerance recently. Of note, patient as his intolerance is provoked. He quit smoking in 1970. He drinks less than 2 beers per week and has not had any alcohol in several weeks. He drinks caffeine in the form of 2 large cups per day in the mornings. They have 2 dogs and 1 cat, dogs are often in the bedroom at night. His bedtime is between 10 and 11 PM, rise time around 7 or 7:30. He had a uvulectomy when he was in his 95s for snoring and he had a tonsillectomy as a child. He typically sleeps on his back because of bilateral shoulder pain. He had his left hip replaced as well as both knees.  His Past Medical History Is Significant For: Past Medical History:  Diagnosis Date   Arthritis    CAD (coronary artery disease)    Chronic kidney disease    Complication of anesthesia    reports " i lose my blood pressure after anesthesia; the drops usually happens in recovery"    Depression    Hand pain    currently being evaluated by rheumatology for dx    Hematuria    Hypertension    Rapid heart beat    rpeorts  " 2 months ago i had a rapid heart beat that lasted 45 min to an hour, ive experience this twice in the last t10 years but only seconds long" " i was evaluated by cardiology Dr Landry Mellow who did a 30 day monitor and ECHO and said i have occasional [PATS] ? , denies chest pain nor LOC  during these episodes; HR today is WDL    Sleep apnea    CPAP     His Past Surgical History Is Significant For: Past Surgical History:  Procedure Laterality Date   CATARACT EXTRACTION, BILATERAL     HIP SURGERY Left 2012   JOINT REPLACEMENT     REPLACEMENT TOTAL KNEE Right    2001,2006,2009,2013   REPLACEMENT TOTAL KNEE Left 2011    TOTAL HIP ARTHROPLASTY Right 08/14/2018   Procedure: TOTAL HIP ARTHROPLASTY ANTERIOR APPROACH;  Surgeon: Frederik Pear, MD;  Location: [facility];  Service: Orthopedics;  Laterality: Right;    His Family History Is Significant For: Family History  Problem Relation Age of Onset   Cancer Mother        throat   Hypertension Mother    Heart disease Mother    Emphysema Mother    Hypertension Father    Cancer Father        Brain   Hypertension Sister    Hematuria Sister    Hematuria Brother     His Social  History Is Significant For: Social History   Socioeconomic History   Marital status: Married    Spouse name: Not on file   Number of children: Not on file   Years of education: Not on file   Highest education level: Bachelor's degree (e.g., BA, AB, BS)  Occupational History   Occupation: Retired  Scientist, product/process development strain: Not hard at [employer]    Worry: Never true    Inability: Never true   Transportation needs    Medical: No    Non-medical: No  Tobacco Use   Smoking status: Former Smoker    Types: Cigarettes    Quit date: 1971    Years since quitting: 49.5   Smokeless tobacco: Never Used  Substance and Sexual Activity   Alcohol use: Yes    Alcohol/week: 3.0 standard drinks    Types: 3 Cans of beer per week    Comment: max 3 beers/week   Drug use: No    Comment: former user of marijuana, quit in 1971   Sexual activity: Yes  Lifestyle   Physical activity    Days per week: 0 days    Minutes per session: 0 min   Stress: Not at all  Relationships   Social connections    Talks on phone: More than three times a week    Gets together: Once a week    Attends religious service: More than 4 times per year    Active member of club or organization: Yes    Attends meetings of clubs or organizations: More than 4 times per year    Relationship status: Married  Other Topics Concern   Not on file  Social History Narrative     Lives with his wife.   Education: College   Exercise: No   Drinks about 2 large coffees per day   Right handed      Spent many years in [place].  Moved to [place] to be near their children.  Daughter lives in [place].  Their son has lived in the [place] area with plans to move to [place] in summer 2019.    His Allergies Are:  Allergies  Allergen Reactions   Nsaids     Affects creatinine   Dilaudid [Hydromorphone Hcl] Other (See Comments)    Suicidal  :   His Current Medications Are:  Outpatient Encounter Medications as of 04/05/2019  Medication Sig   aspirin EC 81 MG tablet Take 1 tablet (81 mg total) by mouth 2 (two) times daily.   atorvastatin (LIPITOR) 20 MG tablet Take 1 tablet (20 mg total) by mouth daily.   buPROPion (WELLBUTRIN SR) 200 MG 12 hr tablet Take 2 tablets (400 mg total) by mouth daily.   cholecalciferol (VITAMIN D3) 25 MCG (1000 UT) tablet Take 1,000 Units by mouth.    losartan (COZAAR) 100 MG tablet TAKE 1 TABLET BY MOUTH EVERY DAY   No facility-administered encounter medications on file as of 04/05/2019.   :  Review of Systems:  Out of a complete 14 point review of systems, all are reviewed and negative with the exception of these symptoms as listed below:  Virtual Visit via Video Note on 04/05/2019;  I connected with Joseph Maldonado on 04/05/19 at  1:00 PM EDT by a video enabled telemedicine application and verified that I am speaking with the correct person using two identifiers.   I discussed the limitations of evaluation and management by telemedicine and the availability  of in person appointments. The patient expressed understanding and agreed to proceed.  History of Present Illness:  He reports Doing well with his CPAP, generally speaking he is able to consistently use it.  Some nights his leak is much higher.  He uses the humidifier.  He is up for new supplies, has not changed the filter monthly he admits.  He keeps his mask and supplies clean.  He  would like to try nasal pillows.  He knows he is a mouth breather.  He has a follow-up with his primary care physician pending for the fall, he has a cardiology appointment coming up as well as echocardiogram.  Next week he has a repeat chest CT because of a lung nodule they saw on the CT chest on 04/06/2018.  Otherwise, he has been doing well. Continues to benefit from CPAP therapy.   Observations/Objective: Latest weight by self-report was 202 pounds yesterday at home. On examination, he is very pleasant and conversant, in no acute distress, good comprehension and language skills, speech is clear without dysarthria, hypophonia or voice tremor noted, slightly hoarse sounding though.  Hearing is grossly intact.  Face is symmetric with normal facial animation, extraocular movements well-preserved appearing.  He wears corrective eyeglasses.  Airway examination reveals no significant mouth dryness, tongue protrudes centrally in palate elevates symmetrically. Shoulder height appears to be equal, upper body mobility unremarkable, coordination grossly intact in the upper extremities.   Assessment and Plan: In summary,Joseph Maldonadois a very pleasant 31 year oldmalewith an underlying medical history of arthritis, chronic kidney disease, depression, hypertension, reflux disease, chronic cough, lung nodule and overweight state, who presents for A virtual, video based appointment via MyChart video visit for follow-up consultation of his severe obstructive sleep apnea.  He had a split-night sleep study on 05/22/2018.  He established CPAP therapy on 06/08/2018. He continues to be fully compliant with treatment and benefits from it.  He is commended for his treatment adherence.  He reported improvement in particular of his daytime somnolence and sleep quality as well as sleep consolidation. He has been using a fullface mask. There is a higher leak at times due to position changes and his facial hair. Nevertheless,  his settings are fine and he is encouraged to continue with full compliance with his CPAP. He would like to see about trying a nasal pillows interface.  I prescribed this as well as a chinstrap.  He is encouraged to keep up-to-date with his supplies.  He is encouraged to continue to work on [employer].  He has been able to lose some weight and his self-reported weight yesterday was 202 pounds. I suggested a routine follow-up in 12 months, can be see NP. I answered all his questions today and he was in agreement.  Follow Up Instructions:    I discussed the assessment and treatment plan with the patient. The patient was provided an opportunity to ask questions and all were answered. The patient agreed with the plan and demonstrated an understanding of the instructions.   The patient was advised to call back or seek an in-person evaluation if the symptoms worsen or if the condition fails to improve as anticipated.  I provided 15 minutes of non-face-to-face time during this encounter.   Star Age, MD

## 2019-04-05 NOTE — Patient Instructions (Signed)
Given verbally, during today's virtual video-based encounter, with verbal feedback received.   

## 2019-04-10 ENCOUNTER — Other Ambulatory Visit: Payer: Self-pay

## 2019-04-10 ENCOUNTER — Ambulatory Visit (INDEPENDENT_AMBULATORY_CARE_PROVIDER_SITE_OTHER)
Admission: RE | Admit: 2019-04-10 | Discharge: 2019-04-10 | Disposition: A | Discharge status: Home / Self Care | Payer: Medicare Other | Source: Ambulatory Visit | Attending: Internal Medicine | Admitting: Internal Medicine

## 2019-04-10 DIAGNOSIS — R911 Solitary pulmonary nodule: Secondary | ICD-10-CM | POA: Diagnosis not present

## 2019-04-12 NOTE — Progress Notes (Signed)
Spoke with pt and notified of results per Dr. Melvyn Novas. Pt verbalized understanding and denied any questions. He states he is aware of the cards issue and is already followed by cards and is scheduled for ECHO in Aug 2020

## 2019-04-19 DIAGNOSIS — L821 Other seborrheic keratosis: Secondary | ICD-10-CM | POA: Diagnosis not present

## 2019-04-19 DIAGNOSIS — D692 Other nonthrombocytopenic purpura: Secondary | ICD-10-CM | POA: Diagnosis not present

## 2019-04-19 DIAGNOSIS — L820 Inflamed seborrheic keratosis: Secondary | ICD-10-CM | POA: Diagnosis not present

## 2019-04-19 DIAGNOSIS — D1801 Hemangioma of skin and subcutaneous tissue: Secondary | ICD-10-CM | POA: Diagnosis not present

## 2019-05-08 ENCOUNTER — Ambulatory Visit (INDEPENDENT_AMBULATORY_CARE_PROVIDER_SITE_OTHER): Payer: Medicare Other

## 2019-05-08 ENCOUNTER — Other Ambulatory Visit: Payer: Self-pay

## 2019-05-08 DIAGNOSIS — I10 Essential (primary) hypertension: Secondary | ICD-10-CM

## 2019-05-08 DIAGNOSIS — I77810 Thoracic aortic ectasia: Secondary | ICD-10-CM

## 2019-05-10 IMAGING — CT CT HEART MORP W/ CTA COR W/ SCORE W/ CA W/CM &/OR W/O CM
4 of 7 series · 9 of 20 positions shown, 10 images · IV contrast (APPLIED)
Comparison: Chest CT 04/06/2018.

EXAM:
OVER-READ INTERPRETATION  CT CHEST

The following report is an over-read performed by radiologist Dr.
over-read does not include interpretation of cardiac or coronary
anatomy or pathology. The coronary calcium score and cardiac CTA
interpretation by the cardiologist is attached.
CLINICAL DATA: Chest pain and Aortic Aneurysm
Cardiac CTA
MEDICATIONS:
Sub lingual nitro. 4mg and lopressor 10mg
TECHNIQUE: The patient was scanned on a Siemens Force [REDACTED]ice scanner. Gantry
rotation speed was 250 msecs. Collimation was .6 mm. A 100 kV
prospective scan was triggered in the ascending thoracic aorta at
140 HU's Full mA was used between 35% and 75% of the R-R interval.
Average HR during the scan was 52 bpm. The 3D data set was
interpreted on a dedicated work station using MPR, MIP and VRT
modes. A total of 80 cc of contrast was used.

[Series 6: best diast 72 % · axial · 0.41mm/px · z∈[+1162,+1249]mm · 2 of 649 slices shown, 3 images]
[im 217/649  vessel]
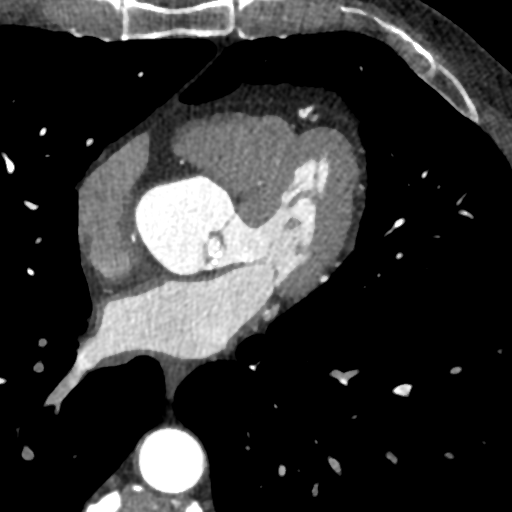
[im 217/649  lung]
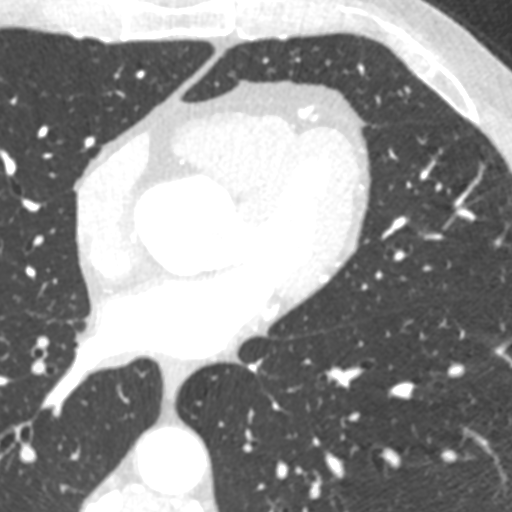
[im 433/649  vessel]
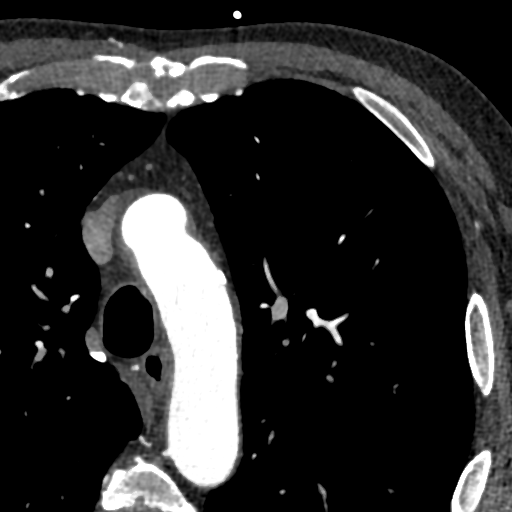

[Series 7: best syst 39 % · axial · 0.41mm/px · z∈[+1162,+1249]mm · 2 of 649 slices shown]
[im 217/649  vessel]
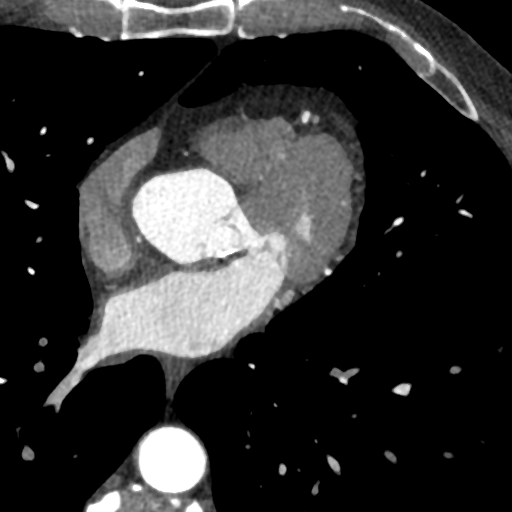
[im 433/649  vessel]
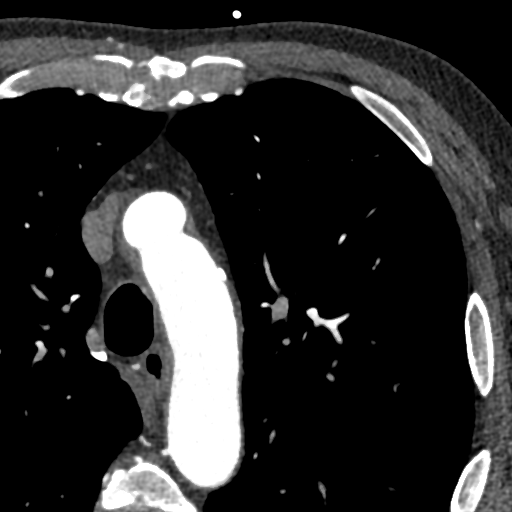

[Series 8: ts diast sharp 72 % · axial · 0.41mm/px · z∈[+1162,+1249]mm · 2 of 649 slices shown]
[im 217/649  lung]
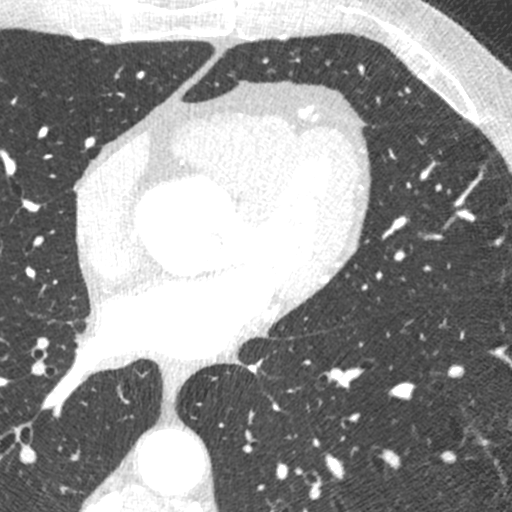
[im 433/649  lung]
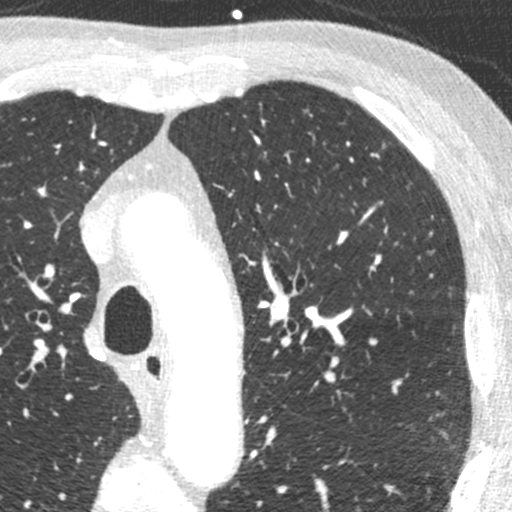

[Series 9: ts syst sharp 39 % · axial · 0.41mm/px · z∈[+1141,+1270]mm · 3 of 649 slices shown]
[im 163/649  lung]
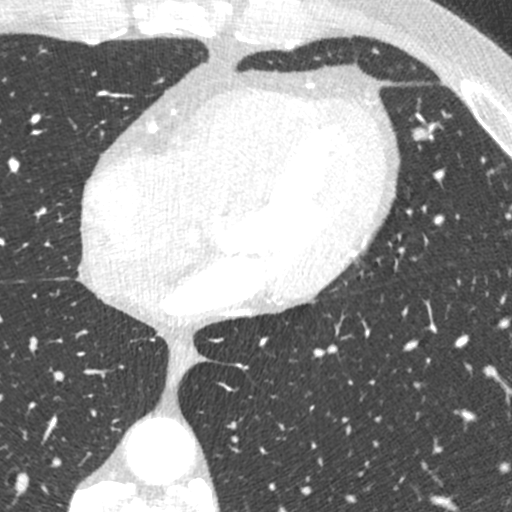
[im 325/649  lung]
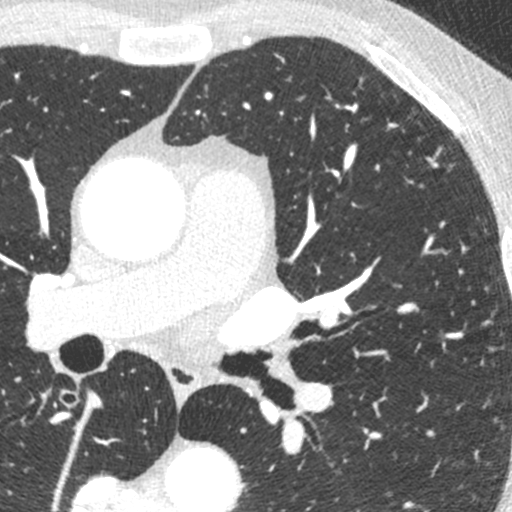
[im 487/649  lung]
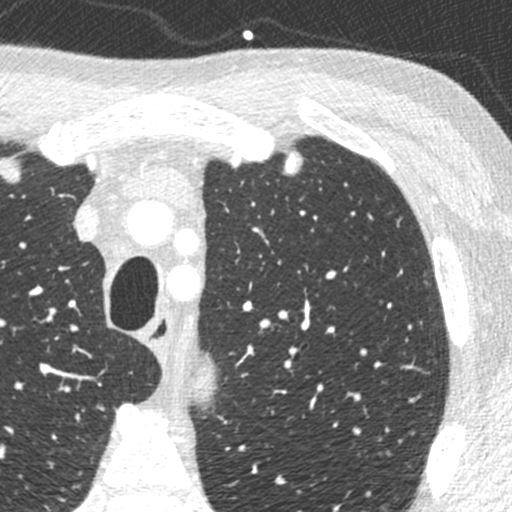

[9 of 20 positions shown; findings below may reference images not displayed]

FINDINGS: Aortic atherosclerosis. As noted on the recent prior chest CT there
is an 8 x 6 mm (mean diameter of 7 mm) pulmonary nodule in the
lingula (axial image 66 of series 12). Within the visualized
portions of the thorax there are no other larger more suspicious
appearing pulmonary nodules or masses, there is no acute
consolidative airspace disease, no pleural effusions, no
pneumothorax and no lymphadenopathy. Visualized portions of the
upper abdomen are unremarkable. There are no aggressive appearing
lytic or blastic lesions noted in the visualized portions of the
skeleton.
IMPRESSION: 1. Persistent lingular nodule with mean diameter of 7 mm, unchanged.
As recommended on the prior study, non-contrast chest CT at 6-12
months is recommended. If the nodule is stable at time of repeat CT,
then future CT at 18-24 months (from today's scan) is considered
optional for low-risk patients, but is recommended for high-risk
patients. This recommendation follows the consensus statement:
Guidelines for Management of Incidental Pulmonary Nodules Detected
[DATE].
2. Aortic Atherosclerosis (MC5L0-0C1.1).
FINDINGS: Non-cardiac: See separate report from [REDACTED]. No
significant findings on limited lung and soft tissue windows.

Calcium Score: Calcium noted in all 3 major epicardial coronary
vessels

Coronary Arteries: Right dominant with no anomalies

LM: Normal

LAD: 50% or less calcific plaque in the proximal and mid vessel.
Less than 50% calcific plaque distally

D1: Less than 50% ostial calcific plaque

D2: Normal

Circumflex: Less than 50% calcific plaque in proximal and mid vessel

OM1: Less than 50% calcified plaque in body

AV groove : Normal

RCA: Less than 50% calcific plaque in proximal mid and distal vessel

PDA: 50% calcific plaque in PLB and PDA

The aortic root was dilated in setting of tri leaflet AV

Sinus 3.7 cm

STJ: 3.3 cm

Aortic root 4.2 cm

Arch 3.0 cm

Descending Thoracic 2.7 cm
IMPRESSION: 1. Calcium score 888 which is 80 th percentile for age and sex

2.  Moderate 3 vessel CAD non obstructive see description above

3.  Ascending aortic root dilatation 4.2 cm with bovine arch

Armanta Tae

## 2019-05-17 ENCOUNTER — Encounter: Payer: Self-pay | Admitting: Cardiology

## 2019-05-18 ENCOUNTER — Ambulatory Visit (INDEPENDENT_AMBULATORY_CARE_PROVIDER_SITE_OTHER): Payer: Medicare Other | Admitting: Cardiology

## 2019-05-18 ENCOUNTER — Encounter: Payer: Self-pay | Admitting: Cardiology

## 2019-05-18 ENCOUNTER — Other Ambulatory Visit: Payer: Self-pay

## 2019-05-18 VITALS — BP 131/70 | HR 74 | Ht 71.0 in | Wt 208.0 lb

## 2019-05-18 DIAGNOSIS — I2510 Atherosclerotic heart disease of native coronary artery without angina pectoris: Secondary | ICD-10-CM | POA: Insufficient documentation

## 2019-05-18 DIAGNOSIS — I10 Essential (primary) hypertension: Secondary | ICD-10-CM | POA: Diagnosis not present

## 2019-05-18 DIAGNOSIS — I712 Thoracic aortic aneurysm, without rupture: Secondary | ICD-10-CM | POA: Diagnosis not present

## 2019-05-18 DIAGNOSIS — I7121 Aneurysm of the ascending aorta, without rupture: Secondary | ICD-10-CM | POA: Insufficient documentation

## 2019-05-18 MED ORDER — ATENOLOL 25 MG PO TABS: 25.0000 mg | ORAL_TABLET | Freq: Every day | ORAL | 3 refills | Status: DC

## 2019-05-18 MED ORDER — LOSARTAN POTASSIUM 100 MG PO TABS: 100.0000 mg | ORAL_TABLET | Freq: Every day | ORAL | 3 refills | Status: DC

## 2019-05-18 NOTE — Progress Notes (Signed)
Follow up visit  Subjective:   Joseph Maldonado., male    DOB: 02-04-1946, 73 y.o.   MRN: [id number]   Chief Complaint  Patient presents with  . Coronary Artery Disease  . Follow-up    HPI  73 y/o Caucasian male with controlled hypertension, h/o OSA s/p uvulectomy, ascending aorta aneurysm, palpitations, here for follow up.  Patient recently underwent echocardiogram that showed aortic root of 4.2 cm. He is doing well. He has recently started riding the bike for up to 1 hour without any complaints. He has occasional skin bruising wince being on Aspirin, but denies any hematemesis, melena.   Past Medical History:  Diagnosis Date  . Arthritis   . CAD (coronary artery disease)   . Chronic kidney disease   . Complication of anesthesia    reports " i lose my blood pressure after anesthesia; the drops usually happens in recovery"   . Depression   . Hand pain    currently being evaluated by rheumatology for dx   . Hematuria   . Hypertension   . Rapid heart beat    rpeorts  " 2 months ago i had a rapid heart beat that lasted 45 min to an hour, ive experience this twice in the last t10 years but only seconds long" " i was evaluated by cardiology Dr Landry Mellow who did a 30 day monitor and ECHO and said i have occasional [PATS] ? , denies chest pain nor LOC  during these episodes; HR today is WDL   . Sleep apnea    CPAP      Past Surgical History:  Procedure Laterality Date  . CATARACT EXTRACTION, BILATERAL    . HIP SURGERY Left 2012  . JOINT REPLACEMENT    . REPLACEMENT TOTAL KNEE Right    2001,2006,2009,2013  . REPLACEMENT TOTAL KNEE Left 2011  . TOTAL HIP ARTHROPLASTY Right 08/14/2018   Procedure: TOTAL HIP ARTHROPLASTY ANTERIOR APPROACH;  Surgeon: Frederik Pear, MD;  Location: [facility];  Service: Orthopedics;  Laterality: Right;     Social History   Socioeconomic History  . Marital status: Married    Spouse name: Not on file  . Number of children: 2  . Years of  education: Not on file  . Highest education level: Bachelor's degree (e.g., BA, AB, BS)  Occupational History  . Occupation: Retired  Scientific laboratory technician  . Financial resource strain: Not hard at all  . Food insecurity    Worry: Never true    Inability: Never true  . Transportation needs    Medical: No    Non-medical: No  Tobacco Use  . Smoking status: Former Smoker    Types: Cigarettes    Quit date: 1971    Years since quitting: 49.6  . Smokeless tobacco: Never Used  Substance and Sexual Activity  . Alcohol use: Yes    Alcohol/week: 3.0 standard drinks    Types: 3 Cans of beer per week    Comment: max 3 beers/week  . Drug use: No    Comment: former user of marijuana, quit in 1971  . Sexual activity: Yes  Lifestyle  . Physical activity    Days per week: 0 days    Minutes per session: 0 min  . Stress: Not at all  Relationships  . Social connections    Talks on phone: More than three times a week    Gets together: Once a week    Attends religious service: More than 4 times per year  Active member of club or organization: Yes    Attends meetings of clubs or organizations: More than 4 times per year    Relationship status: Married  . Intimate partner violence    Fear of current or ex partner: No    Emotionally abused: No    Physically abused: No    Forced sexual activity: No  Other Topics Concern  . Not on file  Social History Narrative   Lives with his wife.   Education: College   Exercise: No   Drinks about 2 large coffees per day   Right handed      Spent many years in [place].  Moved to [place] to be near their children.  Daughter lives in [place].  Their son has lived in the [place] area with plans to move to [place] in summer 2019.     Family History  Problem Relation Age of Onset  . Cancer Mother        throat  . Hypertension Mother   . Heart disease Mother   . Emphysema Mother   . Hypertension Father   . Cancer Father        Brain  . Hypertension  Sister   . Hematuria Sister   . Hematuria Brother      Current Outpatient Medications on File Prior to Visit  Medication Sig Dispense Refill  . aspirin EC 81 MG tablet Take 81 mg by mouth daily.    Marland Kitchen atorvastatin (LIPITOR) 20 MG tablet Take 1 tablet (20 mg total) by mouth daily. 90 tablet 1  . buPROPion (WELLBUTRIN SR) 200 MG 12 hr tablet Take 2 tablets (400 mg total) by mouth daily. 180 tablet 1  . losartan (COZAAR) 100 MG tablet TAKE 1 TABLET BY MOUTH EVERY DAY 90 tablet 1   No current facility-administered medications on file prior to visit.     Cardiovascular studies:  Echocardiogram 05/08/2019: Left ventricle cavity is normal in size. Normal left ventricular wall thickness. Normal LV systolic function with EF 67%. Normal global wall motion. Normal diastolic filling pattern. Calculated EF 67%. Left atrial cavity is mildly dilated. Trileaflet aortic valve. Moderate (Grade II) aortic regurgitation. Mild aortic valve leaflet calcification. Aortic root dilated, measuring 4.2 cm at sinus of Valsalva. Compared to previous study on 04/19/2018, there is minimal increase in aortic root diameter.   CTA chest and coronary CTA 06/20/2018: Calcium score 888 LM: Normal LAD: 50% or less calcified plaque in prox and mid LAD      <50% calcific plaque distally      D1: <50% ostial calcific plaque      D2: Normal LCx: <50% calcific plaque prox and mid vessel      OM1: <50% calcific plaque      AV grove: Normal RCA: <50% calcific plaque prox, mid, and distal vessel.      PDA: 50% calcific plaque on PLB and PDA  Aortic root: dilated Sinus: 3.7 cm STJ: 3.3 cm Aortic root: 4.2 cm Descending thoracic aorta 2.7 cm  Impression: Calcium score 80th percentile Nonobstructive three vessel CAD Aortic root dilatation 4.2 cm with Bovine arch  Echocardiogram 04/19/2018: Left ventricle cavity is normal in size. Mild concentric hypertrophy of the left ventricle. Normal global wall motion.  Calculated EF 69%. Normal diastolic filling pattern. Calcification seen on noncoronary cusp. Moderate (Grade II) aortic regurgitation. Mild tricuspid regurgitation.  No evidence of pulmonary hypertension.  The aortic root is dilated, measuring 4.0 cm at sinotubular junction.  Event monitor 02/17/2018 -  03/18/2018: Sinus rhythm.  No arrhythmias.  No symptoms reported.  Recent labs: 06/16/2018: Glucose 95. BUN/Cr 28/1.22. eGFR 60. Na/K 141/4.7   Review of Systems  Constitution: Negative for decreased appetite, malaise/fatigue, weight gain and weight loss.  HENT: Negative for congestion.   Eyes: Negative for visual disturbance.  Cardiovascular: Negative for chest pain, dyspnea on exertion, leg swelling, palpitations and syncope.  Respiratory: Negative for cough.   Endocrine: Negative for cold intolerance.  Hematologic/Lymphatic: Bruises/bleeds easily.  Skin: Negative for itching and rash.  Musculoskeletal: Negative for myalgias.  Gastrointestinal: Negative for abdominal pain, nausea and vomiting.  Genitourinary: Negative for dysuria.  Neurological: Negative for dizziness and weakness.  Psychiatric/Behavioral: The patient is not nervous/anxious.   All other systems reviewed and are negative.       Vitals:   05/18/19 0848  BP: 131/70  Pulse: 74  SpO2: 99%    Body mass index is 29.01 kg/m. Filed Weights   05/18/19 0848  Weight: 208 lb (94.3 kg)     Objective:   Physical Exam  Constitutional: He is oriented to person, place, and time. He appears well-developed and well-nourished. No distress.  HENT:  Head: Normocephalic and atraumatic.  Eyes: Pupils are equal, round, and reactive to light. Conjunctivae are normal.  Neck: No JVD present.  Cardiovascular: Normal rate, regular rhythm and intact distal pulses.  Pulmonary/Chest: Effort normal and breath sounds normal. He has no wheezes. He has no rales.  Abdominal: Soft. Bowel sounds are normal. There is no rebound.   Musculoskeletal:        General: No edema.  Lymphadenopathy:    He has no cervical adenopathy.  Neurological: He is alert and oriented to person, place, and time. No cranial nerve deficit.  Skin: Skin is warm and dry.  Psychiatric: He has a normal mood and affect.  Nursing note and vitals reviewed.         Assessment & Recommendations:   73 y/o Caucasian male with controlled hypertension, h/o OSA s/p uvulectomy, ascending aorta aneurysm, palpitations, here for follow up.  Ascending aorta aneurysm, grade II AI: 4.2 cm on echocardiogram in 04/2019 Echocardiogram and CTA (03/2018 AND 05/2018) correlate with each other. Continue losartan 100 mg.  Added atenolol 25 mg daily. Repeat echocardiogram in 04/2021.  Nonobstructive CAD: Continue aspirin/statin.  Hypertension: Added atenolol 25 mg.  Pulmonary nodule: Reportedly, known to the patient. Follow up with Dr. Carlota Raspberry.  F/u in 1 year.   Nigel Mormon, MD [facility] Pager: [phone] Office: [phone] If no answer Cell [phone]

## 2019-05-22 DIAGNOSIS — Z96651 Presence of right artificial knee joint: Secondary | ICD-10-CM | POA: Diagnosis not present

## 2019-06-01 ENCOUNTER — Telehealth: Payer: Self-pay | Admitting: Internal Medicine

## 2019-06-01 ENCOUNTER — Other Ambulatory Visit: Payer: Self-pay

## 2019-06-01 ENCOUNTER — Ambulatory Visit (INDEPENDENT_AMBULATORY_CARE_PROVIDER_SITE_OTHER): Payer: Medicare Other | Admitting: Pulmonary Disease

## 2019-06-01 ENCOUNTER — Encounter: Payer: Self-pay | Admitting: Pulmonary Disease

## 2019-06-01 VITALS — BP 104/60 | HR 52 | Temp 98.4°F | Ht 71.0 in | Wt 208.0 lb

## 2019-06-01 DIAGNOSIS — R911 Solitary pulmonary nodule: Secondary | ICD-10-CM

## 2019-06-01 DIAGNOSIS — I712 Thoracic aortic aneurysm, without rupture: Secondary | ICD-10-CM | POA: Diagnosis not present

## 2019-06-01 DIAGNOSIS — R059 Cough, unspecified: Secondary | ICD-10-CM

## 2019-06-01 DIAGNOSIS — R05 Cough: Secondary | ICD-10-CM | POA: Diagnosis not present

## 2019-06-01 DIAGNOSIS — G4733 Obstructive sleep apnea (adult) (pediatric): Secondary | ICD-10-CM | POA: Diagnosis not present

## 2019-06-01 DIAGNOSIS — I7121 Aneurysm of the ascending aorta, without rupture: Secondary | ICD-10-CM

## 2019-06-01 MED ORDER — FLUTICASONE PROPIONATE 50 MCG/ACT NA SUSP: 1.0000 | Freq: Every day | NASAL | 6 refills | Status: DC

## 2019-06-01 MED ORDER — BENZONATATE 200 MG PO CAPS: 200.0000 mg | ORAL_CAPSULE | Freq: Three times a day (TID) | ORAL | 3 refills | Status: DC | PRN

## 2019-06-01 NOTE — Assessment & Plan Note (Signed)
Plan: Follow-up in 6 months with Dr. Melvyn Novas, can consider repeat scan in July/2021

## 2019-06-01 NOTE — Assessment & Plan Note (Signed)
Plan: Continue CPAP therapy managed by neurology

## 2019-06-01 NOTE — Telephone Encounter (Signed)
Spoke with pt. He is requesting a refill on Tessalon. Advised him that we have not seen him in over a year, he would need an appointment before we could refill his medication. Pt has been scheduled to see Aaron Edelman today at 1430. Nothing further needed at this time.

## 2019-06-01 NOTE — Progress Notes (Signed)
@Patient  ID: Joseph Maldonado., male    DOB: 1946/09/27, 73 y.o.   MRN: [id number]  Chief Complaint  Patient presents with  . Follow-up    F/U for medication refill for Tessalon perles. Patient has a dry cough that he has had for the past few months. Denies any SOB.     Referring provider: Wendie Agreste, MD  HPI:  73 year old male former smoker followed in our office for cough  PMH: Osteoarthritis, chronic kidney disease, palpitations, squamous cell carcinoma, hypertension Smoker/ Smoking History: Former smoker.  Quit 1971.  1/2 pack year smoking history. Maintenance:   Pt of: Dr. Melvyn Novas  06/01/2019  - Visit   73 year old male former smoker followed in our office for cough.  Patient presenting to in office today for medication refills.  Patient was last seen in 2019.  Patient believes that his breathing is stable.  Patient would like a refill of Tessalon Perles to help with management of cough chronically.  Reports that his only change his medical history over the last year was that he is now managed for obstructive sleep apnea.  This is managed by [facility] neurology.     Tests:   W/u for asthma [facility] 2008 > neg Max gerd rx 09/29/2016 > resolved  - recurred around Mid may 2019 s antecedent uri  - CT chest 04/06/18 spn only > see sep a/p - CT sinus 04/06/2018 Normal paranasal sinuses 04/18/2018 added 1st gen H1 blockers per guidelines  Allergy profile 04/18/2018 > Eos 0.4 / IgE 16 RAST pos dust and timothy grass  - 04/20/2018 referred to ENT/ Dr Joya Gaskins saw Mila Homer instead rec cough suppression/ consider neuromodulator rx   05/22/2018- split-night sleep study- AHI 52.2 an hour  FENO:  No results found for: NITRICOXIDE  PFT: No flowsheet data found.  Imaging: No results found.    Specialty Problems      Pulmonary Problems   Cough    W/u for asthma [facility] 2008 > neg Max gerd rx 09/29/2016 > resolved  - recurred around Mid may 2019 s  antecedent uri  - CT chest 04/06/18 spn only > see sep a/p - CT sinus  04/06/2018 Normal paranasal sinuses 04/18/2018 added 1st gen H1 blockers per guidelines    Allergy profile 04/18/2018 >  Eos 0.4 /  IgE 16  RAST pos dust and timothy grass   - 04/20/2018 referred to ENT/ Dr Joya Gaskins saw Mila Homer instead rec cough suppression/ consider neuromodulator rx       Solitary pulmonary nodule    See cxr 07/31/16 c/w calcified granuloma - CT chest 04/06/18 Solitary 8 mm solid lingular pulmonary nodule. Non-contrast chest CT at 6-12 months is recommended> no significant smoking hx  - CT 04/10/2019 1. Stable 8 mm solid lingular pulmonary nodule, for which 12 month stability has been demonstrated, very likely benign. Follow-up chest CT in 12 months is considered optional for low-risk patients, b  2. Three-vessel coronary atherosclerosis. 3. Stable ectatic 4.3 cm ascending thoracic aorta > referred to cards for f/u 2 and 3       OSA (obstructive sleep apnea)    05/22/2018- split-night sleep study- AHI 52.2 an hour  Managed by [facility] neurology         Allergies  Allergen Reactions  . Nsaids     Affects creatinine  . Dilaudid [Hydromorphone Hcl] Other (See Comments)    Suicidal    Immunization History  Administered Date(s) Administered  . Influenza, High Dose  Seasonal PF 07/26/2018  . Influenza,inj,Quad PF,6+ Mos 07/07/2016  . Influenza-Unspecified 07/13/2012, 10/01/2015, 07/05/2017  . Pneumococcal Conjugate-13 04/10/2014  . Pneumococcal Polysaccharide-23 12/17/2015  . Pneumococcal-Unspecified 09/27/2010  . Tdap 02/09/2015  . Zoster 09/27/2010  . Zoster Recombinat (Shingrix) 02/04/2017, 06/28/2017    Past Medical History:  Diagnosis Date  . Arthritis   . CAD (coronary artery disease)   . Chronic kidney disease   . Complication of anesthesia    reports " i lose my blood pressure after anesthesia; the drops usually happens in recovery"   . Depression   . Hand pain     currently being evaluated by rheumatology for dx   . Hematuria   . Hypertension   . Rapid heart beat    rpeorts  " 2 months ago i had a rapid heart beat that lasted 45 min to an hour, ive experience this twice in the last t10 years but only seconds long" " i was evaluated by cardiology Dr Landry Mellow who did a 30 day monitor and ECHO and said i have occasional [PATS] ? , denies chest pain nor LOC  during these episodes; HR today is WDL   . Sleep apnea    CPAP     Tobacco History: Social History   Tobacco Use  Smoking Status Former Smoker  . Packs/day: 0.50  . Years: 3.00  . Pack years: 1.50  . Types: Cigarettes  . Quit date: 47  . Years since quitting: 49.7  Smokeless Tobacco Never Used   Counseling given: Yes   Continue to not smoke  Outpatient Encounter Medications as of 06/01/2019  Medication Sig  . aspirin EC 81 MG tablet Take 81 mg by mouth daily.  Marland Kitchen atenolol (TENORMIN) 25 MG tablet Take 1 tablet (25 mg total) by mouth daily.  Marland Kitchen atorvastatin (LIPITOR) 20 MG tablet Take 1 tablet (20 mg total) by mouth daily.  Marland Kitchen buPROPion (WELLBUTRIN SR) 200 MG 12 hr tablet Take 2 tablets (400 mg total) by mouth daily.  Marland Kitchen losartan (COZAAR) 100 MG tablet Take 1 tablet (100 mg total) by mouth daily.  . benzonatate (TESSALON) 200 MG capsule Take 1 capsule (200 mg total) by mouth 3 (three) times daily as needed for cough.  . fluticasone (FLONASE) 50 MCG/ACT nasal spray Place 1 spray into both nostrils daily.   No facility-administered encounter medications on file as of 06/01/2019.      Review of Systems  Review of Systems  Constitutional: Negative for activity change, chills, fatigue, fever and unexpected weight change.  HENT: Negative for postnasal drip, rhinorrhea, sinus pressure, sinus pain and sore throat.   Eyes: Negative.   Respiratory: Positive for cough. Negative for shortness of breath and wheezing.   Cardiovascular: Negative for chest pain and palpitations.  Gastrointestinal:  Negative for constipation, diarrhea, nausea and vomiting.  Endocrine: Negative.   Genitourinary: Negative.   Musculoskeletal: Negative.   Skin: Negative.   Allergic/Immunologic: Positive for environmental allergies (dust, grass).  Neurological: Negative for dizziness and headaches.  Psychiatric/Behavioral: Negative.  Negative for dysphoric mood. The patient is not nervous/anxious.   All other systems reviewed and are negative.    Physical Exam  BP 104/60   Pulse (!) 52   Temp 98.4 F (36.9 C) (Oral)   Ht 5\' 11"  (1.803 m)   Wt 208 lb (94.3 kg)   SpO2 98%   BMI 29.01 kg/m   Wt Readings from Last 5 Encounters:  06/01/19 208 lb (94.3 kg)  05/18/19 208 lb (94.3 kg)  01/04/19 206 lb (93.4 kg)  12/05/18 217 lb 9.6 oz (98.7 kg)  10/05/18 212 lb (96.2 kg)     Physical Exam Vitals signs and nursing note reviewed.  Constitutional:      General: He is not in acute distress.    Appearance: Normal appearance. He is normal weight.  HENT:     Head: Normocephalic and atraumatic.     Right Ear: Hearing, tympanic membrane, ear canal and external ear normal.     Left Ear: Hearing, tympanic membrane, ear canal and external ear normal.     Nose: Congestion and rhinorrhea present. No mucosal edema.     Right Turbinates: Not enlarged.     Left Turbinates: Not enlarged.     Mouth/Throat:     Mouth: Mucous membranes are dry.     Pharynx: Oropharynx is clear. No oropharyngeal exudate.     Comments: pnd Eyes:     Pupils: Pupils are equal, round, and reactive to light.  Neck:     Musculoskeletal: Normal range of motion.  Cardiovascular:     Rate and Rhythm: Normal rate and regular rhythm.     Pulses: Normal pulses.     Heart sounds: Normal heart sounds. No murmur.  Pulmonary:     Effort: Pulmonary effort is normal. No respiratory distress.     Breath sounds: Normal breath sounds. No decreased breath sounds, wheezing or rales.  Musculoskeletal:     Right lower leg: No edema.     Left  lower leg: No edema.  Lymphadenopathy:     Cervical: No cervical adenopathy.  Skin:    General: Skin is warm and dry.     Capillary Refill: Capillary refill takes less than 2 seconds.     Findings: No erythema or rash.  Neurological:     General: No focal deficit present.     Mental Status: He is alert and oriented to person, place, and time.     Motor: No weakness.     Coordination: Coordination normal.     Gait: Gait is intact. Gait normal.  Psychiatric:        Mood and Affect: Mood normal.        Behavior: Behavior normal. Behavior is cooperative.        Thought Content: Thought content normal.        Judgment: Judgment normal.      Lab Results:  CBC    Component Value Date/Time   WBC 18.1 (H) 08/15/2018 0429   RBC 3.67 (L) 08/15/2018 0429   HGB 11.2 (L) 08/15/2018 0429   HGB 14.7 01/30/2018 0936   HCT 35.5 (L) 08/15/2018 0429   HCT 44.2 01/30/2018 0936   PLT 182 08/15/2018 0429   PLT 267 01/30/2018 0936   MCV 96.7 08/15/2018 0429   MCV 90.1 03/13/2018 1652   MCV 93 01/30/2018 0936   MCH 30.5 08/15/2018 0429   MCHC 31.5 08/15/2018 0429   RDW 13.7 08/15/2018 0429   RDW 14.1 01/30/2018 0936   LYMPHSABS 1.4 08/07/2018 1235   LYMPHSABS 1.7 01/30/2018 0936   MONOABS 0.5 08/07/2018 1235   EOSABS 0.3 08/07/2018 1235   EOSABS 0.5 (H) 01/30/2018 0936   BASOSABS 0.0 08/07/2018 1235   BASOSABS 0.0 01/30/2018 0936    BMET    Component Value Date/Time   NA 138 08/15/2018 0429   NA 139 01/30/2018 0936   K 4.5 08/15/2018 0429   CL 107 08/15/2018 0429   CO2 24 08/15/2018 0429   GLUCOSE  147 (H) 08/15/2018 0429   BUN 24 (H) 08/15/2018 0429   BUN 31 (H) 01/30/2018 0936   CREATININE 1.21 08/15/2018 0429   CREATININE 1.39 (H) 07/29/2016 1100   CALCIUM 9.2 08/15/2018 0429   GFRNONAA 58 (L) 08/15/2018 0429   GFRNONAA 51 (L) 07/29/2016 1100   GFRAA >60 08/15/2018 0429   GFRAA 59 (L) 07/29/2016 1100    BNP No results found for: BNP  ProBNP No results found for:  PROBNP    Assessment & Plan:   OSA (obstructive sleep apnea) Plan: Continue CPAP therapy managed by neurology  Aneurysm of ascending aorta (HCC) Continue follow-up with cardiology Dr. Virgina Jock  Cough No changes in cough regimen today  Plan: Refill Tessalon Perles Refill Flonase nasal spray Follow-up in 6 months with Dr. Melvyn Novas   Solitary pulmonary nodule Plan: Follow-up in 6 months with Dr. Melvyn Novas, can consider repeat scan in July/2021    Return in about 6 months (around 11/29/2019), or if symptoms worsen or fail to improve, for Follow up with Dr. Melvyn Novas.   Lauraine Rinne, NP 06/01/2019   This appointment was 26 minutes long with over 50% of the time in direct face-to-face patient care, assessment, plan of care, and follow-up.

## 2019-06-01 NOTE — Assessment & Plan Note (Signed)
Continue follow-up with cardiology Dr. Virgina Jock

## 2019-06-01 NOTE — Assessment & Plan Note (Signed)
No changes in cough regimen today  Plan: Refill Tessalon Perles Refill Flonase nasal spray Follow-up in 6 months with Dr. Melvyn Novas

## 2019-06-01 NOTE — Patient Instructions (Addendum)
You were seen today by Lauraine Rinne, NP  for:   1. Cough  - benzonatate (TESSALON) 200 MG capsule; Take 1 capsule (200 mg total) by mouth 3 (three) times daily as needed for cough.  Dispense: 30 capsule; Refill: 3 - fluticasone (FLONASE) 50 MCG/ACT nasal spray; Place 1 spray into both nostrils daily.  Dispense: 16 g; Refill: 6  We recommend you receive your flu vaccine in fall/2020  2. Solitary pulmonary nodule  Can consider repeating CT imaging in July/2021 Can discuss at next office visit with Dr. Melvyn Novas  3. Aneurysm of ascending aorta (HCC)  Continue follow-up with Dr. Virgina Jock  4. OSA (obstructive sleep apnea)  Continue follow-up with neurology Continue CPAP therapy   We recommend today:  No orders of the defined types were placed in this encounter.  No orders of the defined types were placed in this encounter.  Meds ordered this encounter  Medications  . benzonatate (TESSALON) 200 MG capsule    Sig: Take 1 capsule (200 mg total) by mouth 3 (three) times daily as needed for cough.    Dispense:  30 capsule    Refill:  3  . fluticasone (FLONASE) 50 MCG/ACT nasal spray    Sig: Place 1 spray into both nostrils daily.    Dispense:  16 g    Refill:  6    Follow Up:    Return in about 6 months (around 11/29/2019), or if symptoms worsen or fail to improve, for Follow up with Dr. Melvyn Novas.   Please do your part to reduce the spread of COVID-19:      Reduce your risk of any infection  and COVID19 by using the similar precautions used for avoiding the common cold or flu:  Marland Kitchen Wash your hands often with soap and warm water for at least 20 seconds.  If soap and water are not readily available, use an alcohol-based hand sanitizer with at least 60% alcohol.  . If coughing or sneezing, cover your mouth and nose by coughing or sneezing into the elbow areas of your shirt or coat, into a tissue or into your sleeve (not your hands). Langley Gauss A MASK when in public  . Avoid shaking hands  with others and consider head nods or verbal greetings only. . Avoid touching your eyes, nose, or mouth with unwashed hands.  . Avoid close contact with people who are sick. . Avoid places or events with large numbers of people in one location, like concerts or sporting events. . If you have some symptoms but not all symptoms, continue to monitor at home and seek medical attention if your symptoms worsen. . If you are having a medical emergency, call 911.   [place] / e-Visit: [email or web address]         [facility]: [facility]: [id number]                   [facility]: [id number]     It is flu season:   >>> Best ways to protect herself from the flu: Receive the yearly flu vaccine, practice good hand hygiene washing with soap and also using hand sanitizer when available, eat a nutritious meals, get adequate rest, hydrate appropriately   Please contact the office if your symptoms worsen or you have concerns that you are not improving.   Thank you for choosing [facility] for your healthcare, and for allowing  [name] to partner with you on your healthcare journey. I am thankful to be able to provide care to you today.   Wyn Quaker FNP-C

## 2019-06-10 NOTE — Progress Notes (Signed)
Chart and office note reviewed in detail  > agree with a/p as outlined    

## 2019-06-11 ENCOUNTER — Encounter: Payer: Self-pay | Admitting: Family Medicine

## 2019-06-11 ENCOUNTER — Ambulatory Visit (INDEPENDENT_AMBULATORY_CARE_PROVIDER_SITE_OTHER): Payer: Medicare Other | Admitting: Family Medicine

## 2019-06-11 ENCOUNTER — Other Ambulatory Visit: Payer: Self-pay

## 2019-06-11 VITALS — BP 123/65 | HR 55 | Temp 98.2°F | Resp 14 | Wt 209.8 lb

## 2019-06-11 DIAGNOSIS — I10 Essential (primary) hypertension: Secondary | ICD-10-CM | POA: Diagnosis not present

## 2019-06-11 DIAGNOSIS — E785 Hyperlipidemia, unspecified: Secondary | ICD-10-CM | POA: Diagnosis not present

## 2019-06-11 DIAGNOSIS — I2510 Atherosclerotic heart disease of native coronary artery without angina pectoris: Secondary | ICD-10-CM | POA: Diagnosis not present

## 2019-06-11 DIAGNOSIS — Z125 Encounter for screening for malignant neoplasm of prostate: Secondary | ICD-10-CM | POA: Diagnosis not present

## 2019-06-11 DIAGNOSIS — Z23 Encounter for immunization: Secondary | ICD-10-CM | POA: Diagnosis not present

## 2019-06-11 DIAGNOSIS — G4700 Insomnia, unspecified: Secondary | ICD-10-CM | POA: Diagnosis not present

## 2019-06-11 DIAGNOSIS — F32A Depression, unspecified: Secondary | ICD-10-CM

## 2019-06-11 DIAGNOSIS — F329 Major depressive disorder, single episode, unspecified: Secondary | ICD-10-CM | POA: Diagnosis not present

## 2019-06-11 MED ORDER — BUPROPION HCL ER (SR) 200 MG PO TB12: 400.0000 mg | ORAL_TABLET | Freq: Every day | ORAL | 1 refills | Status: DC

## 2019-06-11 NOTE — Progress Notes (Signed)
Subjective:    Patient ID: Joseph Parrot., male    DOB: 03-26-1946, 73 y.o.   MRN: [id number]  HPI Joseph Maldonado. is a 73 y.o. male Presents today for: Chief Complaint  Patient presents with  . medical review    Here today for his 6 month f/u on med review.  . Cough     thats been going on for 6 week. Start back on the tessalon 10days ago    Cough: History of chronic cough, see previous visits.  Suspected upper airway cough syndrome.  We recommended discontinuation of neck cough drops, Zantac and omeprazole.  Last Junewas treated for secondary sickening with antibiotics.  Has managed with Tessalon Perles in the past. Cough back past 6 weeks.  No mint.  Does not think omeprazole needed - no heartburn. Some PND. Using flonase 1spr/nostril QD.  On tessalon past 10 days - better.  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  Saw pulmonary 9/4.   Cardiology: HTN, ascending aorta aneruysm, CAD.  Started on atenolol 25mg  QD at 8/21 visit.  Less tachycardia - in 50's at pulm visit.  On ASA,statin. Continued losartan 100mg  QD.  Using CPAP for OSA.  Lab Results  Component Value Date   CREATININE 1.21 08/15/2018   . Hyperlipidemia:  Lab Results  Component Value Date   CHOL 165 01/30/2018   HDL 55 01/30/2018   LDLCALC 92 01/30/2018   TRIG 92 01/30/2018   CHOLHDL 3.0 01/30/2018   Lab Results  Component Value Date   ALT 12 01/30/2018   AST 17 01/30/2018   ALKPHOS 98 01/30/2018   BILITOT 0.3 01/30/2018  lipitor 20 mg qd, no new side effects/myalgias.   Depression:  Depression screen [facility] 2/9 06/11/2019 01/04/2019 12/05/2018 03/13/2018 03/03/2018  Decreased Interest 0 0 0 0 0  Down, Depressed, Hopeless 0 0 0 0 0  PHQ - 2 Score 0 0 0 0 0  doing well on current dose wellbutrin. No new side effects. No anxiety. Doing ok with stressors of pandemic.   Prostate cancer screening - requests PSA. R/B discussed. Defers DRE.   Patient Active Problem List   Diagnosis Date  Noted  . OSA (obstructive sleep apnea) 06/01/2019  . Aneurysm of ascending aorta (HCC) 05/18/2019  . Coronary artery disease involving native coronary artery of native heart without angina pectoris 05/18/2019  . Essential hypertension 05/18/2019  . History of total hip arthroplasty, right 08/14/2018  . Osteoarthritis of right hip 08/12/2018  . Palpitations 01/29/2018  . SCC (squamous cell carcinoma) 01/28/2018  . Solitary pulmonary nodule 10/03/2016  . Cough 09/29/2016  . Seborrheic keratosis 02/26/2014  . Rotator cuff tear arthropathy of right shoulder 01/10/2014  . 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 S/P total knee arthroplasty 10/05/2011  . 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 OA (osteoarthritis) of knee 03/01/2011  . CKD (chronic kidney disease) 05/13/2010   Past Medical History:  Diagnosis Date  . Arthritis   . CAD (coronary artery disease)   . Chronic kidney disease   . Complication of anesthesia    reports " i lose my blood pressure after anesthesia; the drops usually happens in recovery"   . Depression   . Hand pain    currently being evaluated by rheumatology for dx   . Hematuria   . Hypertension   . Rapid heart beat    rpeorts  " 2 months ago i had a rapid heart beat that lasted 45  min to an hour, ive experience this twice in the last t10 years but only seconds long" " i was evaluated by cardiology Dr Landry Mellow who did a 30 day monitor and ECHO and said i have occasional [PATS] ? , denies chest pain nor LOC  during these episodes; HR today is WDL   . Sleep apnea    CPAP    Past Surgical History:  Procedure Laterality Date  . CATARACT EXTRACTION, BILATERAL    . HIP SURGERY Left 2012  . JOINT REPLACEMENT    . REPLACEMENT TOTAL KNEE Right    2001,2006,2009,2013  . REPLACEMENT TOTAL KNEE Left 2011  . TOTAL HIP ARTHROPLASTY Right 08/14/2018   Procedure: TOTAL HIP ARTHROPLASTY ANTERIOR APPROACH;  Surgeon: Frederik Pear, MD;  Location: [facility];  Service: Orthopedics;  Laterality: Right;   Allergies  Allergen Reactions  . Nsaids     Affects creatinine  . Dilaudid [Hydromorphone Hcl] Other (See Comments)    Suicidal   Prior to Admission medications   Medication Sig Start Date End Date Taking? Authorizing Provider  aspirin EC 81 MG tablet Take 81 mg by mouth daily.   Yes [provider]  atenolol (TENORMIN) 25 MG tablet Take 1 tablet (25 mg total) by mouth daily. 05/18/19 08/16/19 Yes Patwardhan, Manish J, MD  atorvastatin (LIPITOR) 20 MG tablet Take 1 tablet (20 mg total) by mouth daily. 03/05/19  Yes Patwardhan, Manish J, MD  benzonatate (TESSALON) 200 MG capsule Take 1 capsule (200 mg total) by mouth 3 (three) times daily as needed for cough. 06/01/19  Yes Lauraine Rinne, NP  buPROPion (WELLBUTRIN SR) 200 MG 12 hr tablet Take 2 tablets (400 mg total) by mouth daily. 12/05/18  Yes Wendie Agreste, MD  fluticasone (FLONASE) 50 MCG/ACT nasal spray Place 1 spray into both nostrils daily. 06/01/19  Yes Lauraine Rinne, NP  losartan (COZAAR) 100 MG tablet Take 1 tablet (100 mg total) by mouth daily. 05/18/19  Yes Patwardhan, Reynold Bowen, MD   Social History   Socioeconomic History  . Marital status: Married    Spouse name: Not on file  . Number of children: 2  . Years of education: Not on file  . Highest education level: Bachelor's degree (e.g., BA, AB, BS)  Occupational History  . Occupation: Retired  Scientific laboratory technician  . Financial resource strain: Not hard at all  . Food insecurity    Worry: Never true    Inability: Never true  . Transportation needs    Medical: No    Non-medical: No  Tobacco Use  . Smoking status: Former Smoker    Packs/day: 0.50    Years: 3.00    Pack years: 1.50    Types: Cigarettes    Quit date: 1971    Years since quitting: 49.7  . Smokeless tobacco: Never Used  Substance and Sexual Activity  . Alcohol use: Yes    Alcohol/week: 3.0 standard drinks    Types: 3 Cans of beer per week    Comment:  max 3 beers/week  . Drug use: No    Comment: former user of marijuana, quit in 1971  . Sexual activity: Yes  Lifestyle  . Physical activity    Days per week: 0 days    Minutes per session: 0 min  . Stress: Not at all  Relationships  . Social connections    Talks on phone: More than three times a week    Gets together: Once a week  Attends religious service: More than 4 times per year    Active member of club or organization: Yes    Attends meetings of clubs or organizations: More than 4 times per year    Relationship status: Married  . Intimate partner violence    Fear of current or ex partner: No    Emotionally abused: No    Physically abused: No    Forced sexual activity: No  Other Topics Concern  . Not on file  Social History Narrative   Lives with his wife.   Education: College   Exercise: No   Drinks about 2 large coffees per day   Right handed      Spent many years in [place].  Moved to [place] to be near their children.  Daughter lives in [place].  Their son has lived in the [place] area with plans to move to [place] in summer 2019.    Review of Systems  Constitutional: Negative for fatigue and unexpected weight change.  Eyes: Negative for visual disturbance.  Respiratory: Positive for cough. Negative for chest tightness and shortness of breath.   Cardiovascular: Negative for chest pain, palpitations and leg swelling.  Gastrointestinal: Negative for abdominal pain and blood in stool.  Neurological: Negative for dizziness, light-headedness and headaches.       Objective:   Physical Exam Vitals signs reviewed.  Constitutional:      Appearance: He is well-developed.  HENT:     Head: Normocephalic and atraumatic.  Eyes:     Pupils: Pupils are equal, round, and reactive to light.  Neck:     Vascular: No carotid bruit or JVD.  Cardiovascular:     Rate and Rhythm: Normal rate and regular rhythm.     Heart sounds: Normal heart sounds. No murmur.  Pulmonary:      Effort: Pulmonary effort is normal.     Breath sounds: Normal breath sounds. No stridor. No wheezing, rhonchi or rales.  Skin:    General: Skin is warm and dry.  Neurological:     Mental Status: He is alert and oriented to person, place, and time.     Vitals:   06/11/19 1042  BP: 123/65  Pulse: (!) 55  Resp: 14  Temp: 98.2 F (36.8 C)  TempSrc: Oral  SpO2: 99%  Weight: 209 lb 12.8 oz (95.2 kg)         Assessment & Plan:   Joseph Fazzino. is a 73 y.o. male Essential hypertension - Plan: Comprehensive metabolic panel  -  Stable, tolerating current regimen.   Need for prophylactic vaccination and inoculation against influenza - Plan: Flu Vaccine QUAD High Dose(Fluad)  Depression, Insomnia, unspecified type - Plan: buPROPion (WELLBUTRIN SR) 200 MG 12 hr tablet  -Discussed potential decreased dose but would like to remain at same dose of Wellbutrin for now.  Can discuss further next 6 months.  Hyperlipidemia, unspecified hyperlipidemia type - Plan: Lipid Panel, Comprehensive metabolic panel  -Tolerating Lipitor, continue same.  Labs pending  Screening for malignant neoplasm of prostate - Plan: PSA  - We discussed pros and cons of prostate cancer screening, and after this discussion, he chose to have screening done. PSA obtained only with limitations partial testing discussed    Meds ordered this encounter  Medications  . buPROPion (WELLBUTRIN SR) 200 MG 12 hr tablet    Sig: Take 2 tablets (400 mg total) by mouth daily.    Dispense:  180 tablet    Refill:  1  Patient Instructions    No med changes - recheck in 6 months.   If you have lab work done today you will be contacted with your lab results within the next 2 weeks.  If you have not heard from [name] then please contact us. The fastest way to get your results is to register for My Chart.   IF you received an x-ray today, you will receive an invoice from [facility]. Please contact [facility] at [phone] with questions or concerns regarding your invoice.   IF you received labwork today, you will receive an invoice from [place]. Please contact [facility] at [phone] with questions or concerns regarding your invoice.   Our billing staff will not be able to assist you with questions regarding bills from these companies.  You will be contacted with the lab results as soon as they are available. The fastest way to get your results is to activate your My Chart account. Instructions are located on the last page of this paperwork. If you have not heard from [name] regarding the results in 2 weeks, please contact this office.     \  Signed,   [name], MD [facility] [place].  06/12/19 1:05 PM

## 2019-06-11 NOTE — Patient Instructions (Addendum)
  No med changes. recheck in 6 months.   If you have lab work done today you will be contacted with your lab results within the next 2 weeks.  If you have not heard from us then please contact us. The fastest way to get your results is to register for My Chart.   IF you received an x-ray today, you will receive an invoice from [facility]. Please contact [facility] at [phone] with questions or concerns regarding your invoice.   IF you received labwork today, you will receive an invoice from [facility]. Please contact [facility] at [phone] with questions or concerns regarding your invoice.   Our billing staff will not be able to assist you with questions regarding bills from these companies.  You will be contacted with the lab results as soon as they are available. The fastest way to get your results is to activate your My Chart account. Instructions are located on the last page of this paperwork. If you have not heard from us regarding the results in 2 weeks, please contact this office.      

## 2019-06-12 ENCOUNTER — Encounter: Payer: Self-pay | Admitting: Family Medicine

## 2019-06-12 LAB — COMPREHENSIVE METABOLIC PANEL
ALT: 15 IU/L (ref 0–44)
AST: 15 IU/L (ref 0–40)
Albumin/Globulin Ratio: 2.6 — ABNORMAL HIGH (ref 1.2–2.2)
Albumin: 4.4 g/dL (ref 3.7–4.7)
Alkaline Phosphatase: 93 IU/L (ref 39–117)
BUN/Creatinine Ratio: 26 — ABNORMAL HIGH (ref 10–24)
BUN: 40 mg/dL — ABNORMAL HIGH (ref 8–27)
Bilirubin Total: 0.3 mg/dL (ref 0.0–1.2)
CO2: 17 mmol/L — ABNORMAL LOW (ref 20–29)
Calcium: 9.8 mg/dL (ref 8.6–10.2)
Chloride: 106 mmol/L (ref 96–106)
Creatinine, Ser: 1.55 mg/dL — ABNORMAL HIGH (ref 0.76–1.27)
GFR calc Af Amer: 51 mL/min/{1.73_m2} — ABNORMAL LOW (ref >59)
GFR calc non Af Amer: 44 mL/min/{1.73_m2} — ABNORMAL LOW (ref >59)
Globulin, Total: 1.7 g/dL (ref 1.5–4.5)
Glucose: 99 mg/dL (ref 65–99)
Potassium: 4.6 mmol/L (ref 3.5–5.2)
Sodium: 138 mmol/L (ref 134–144)
Total Protein: 6.1 g/dL (ref 6.0–8.5)

## 2019-06-12 LAB — LIPID PANEL
Chol/HDL Ratio: 2.2 ratio (ref 0.0–5.0)
Cholesterol, Total: 117 mg/dL (ref 100–199)
HDL: 54 mg/dL (ref >39)
LDL Chol Calc (NIH): 45 mg/dL (ref 0–99)
Triglycerides: 96 mg/dL (ref 0–149)
VLDL Cholesterol Cal: 18 mg/dL (ref 5–40)

## 2019-06-12 LAB — PSA: Prostate Specific Ag, Serum: 1.9 ng/mL (ref 0.0–4.0)

## 2019-07-09 DIAGNOSIS — L821 Other seborrheic keratosis: Secondary | ICD-10-CM | POA: Diagnosis not present

## 2019-07-10 ENCOUNTER — Telehealth: Payer: Self-pay | Admitting: Internal Medicine

## 2019-07-10 ENCOUNTER — Encounter: Payer: Self-pay | Admitting: Internal Medicine

## 2019-07-10 ENCOUNTER — Other Ambulatory Visit: Payer: Self-pay

## 2019-07-10 ENCOUNTER — Ambulatory Visit (INDEPENDENT_AMBULATORY_CARE_PROVIDER_SITE_OTHER): Payer: Medicare Other | Admitting: Internal Medicine

## 2019-07-10 DIAGNOSIS — R911 Solitary pulmonary nodule: Secondary | ICD-10-CM | POA: Diagnosis not present

## 2019-07-10 DIAGNOSIS — R05 Cough: Secondary | ICD-10-CM

## 2019-07-10 DIAGNOSIS — R059 Cough, unspecified: Secondary | ICD-10-CM

## 2019-07-10 MED ORDER — OMEPRAZOLE 20 MG PO CPDR: 20.0000 mg | DELAYED_RELEASE_CAPSULE | Freq: Two times a day (BID) | ORAL | Status: DC

## 2019-07-10 MED ORDER — GABAPENTIN 100 MG PO CAPS: 100.0000 mg | ORAL_CAPSULE | Freq: Three times a day (TID) | ORAL | 2 refills | Status: DC

## 2019-07-10 NOTE — Assessment & Plan Note (Addendum)
W/u for asthma [facility] 2008 > neg Max gerd rx 09/29/2016 > resolved  - recurred around Mid may 2019 s antecedent uri  - CT chest 04/06/18 spn only > see sep a/p - MRI sinus  04/06/2018 Normal paranasal sinuses 04/18/2018 added 1st gen H1 blockers per guidelines    Allergy profile 04/18/2018 >  Eos 0.4 /  IgE 16  RAST pos dust and timothy grass   - 04/20/2018 referred to ENT/ Dr Joya Gaskins saw Mila Homer instead rec cough suppression/ consider neuromodulator rx   - 07/10/2019 try 1st gen H1 blockers per guidelines  And gabapentin 100 mg tid   Of the three most common causes of  Sub-acute / recurrent or chronic cough, only one (GERD)  can actually contribute to/ trigger  the other two (asthma and post nasal drip syndrome)  and perpetuate the cylce of cough.  While not intuitively obvious, many patients with chronic low grade reflux do not cough until there is a primary insult that disturbs the protective epithelial barrier and exposes sensitive nerve endings.   This is typically viral but can due to PNDS and  either may apply here.   The point is that once this occurs, it is difficult to eliminate the cycle  using anything but a maximally effective acid suppression regimen at least in the short run, accompanied by an appropriate diet to address non acid GERD and control / eliminate the cough itself with gabapentin titrated as high as 300 tid and if not improving > back to [facility]  Discussed in detail all the  indications, usual  risks and alternatives  relative to the benefits with patient who agrees to proceed with w/u as outlined.

## 2019-07-10 NOTE — Assessment & Plan Note (Signed)
See cxr 07/31/16 c/w calcified granuloma - CT chest 04/06/18 Solitary 8 mm solid lingular pulmonary nodule. Non-contrast chest CT at 6-12 months is recommended> no significant smoking hx  - CT 04/10/2019 1. Stable 8 mm solid lingular pulmonary nodule, for which 12 month stability has been demonstrated, very likely benign. Follow-up chest CT in 12 months is considered optional for low-risk patients, b  2. Three-vessel coronary atherosclerosis. 3. Stable ectatic 4.3 cm ascending thoracic aorta > referred to cards for f/u 2 and 3    Discussed in detail all the  indications, usual  risks and alternatives  relative to the benefits with patient who agrees to proceed with w/u as outlined but no further pulmonary f/u needed for this problem   Each maintenance medication was reviewed in detail including most importantly the difference between maintenance and as needed and under what circumstances the prns are to be used.  Please see AVS for specific  Instructions which are unique to this visit and I personally typed out  which were reviewed in detail in writing with the patient and a copy provided in the mail

## 2019-07-10 NOTE — Patient Instructions (Addendum)
Omeprazole 20 mg Take 30- 60 min before your first and last meals of the day    For drainage / throat tickle try change your antihistamine to  CHLORPHENIRAMINE  4 mg  (Chlortab 4mg   at [employer] should be easiest to find in the green box)  take one every 4 hours as needed - available over the counter- may cause drowsiness so start with just a bedtime dose or two and see how you tolerate it before trying in daytime    If not doing better >>>   Your choice is return to return to Dr Bettina Gavia team  Or start gabapentin 100 mg three times daily   If the gabapentin helps and you need to refill it, we need to see you before the refills run out (3 months)

## 2019-07-10 NOTE — Progress Notes (Signed)
Subjective:     Patient ID: Joseph Maldonado, male   DOB: 1946/01/21,    MRN: [id number]    Brief patient profile:  73 yowm minimal smoking hx quit age 73 with recurrent pattern of cough going back to around 2007/8 while living [place] with neg w/u for asthma but persistent pattern since then about  q 2 year severe cough so referred to pulmonary clinic 09/29/2016 by Dr   [name].   History of Present Illness  09/29/2016 1st Joseph Maldonado Pulmonary office visit/ Joseph Maldonado   Chief Complaint  Patient presents with  . Pulm Consult    Unproductive cough since Nov. 2017. Usually gets this every year but this round isn't responding to medications. No SOB.   onset was acute p dust exposure in mid Nov 2017 day >> noct dry > wet and last exp to dry wall dust mid December  [facility] did CT of bladder and kidneys and found calcium  deposit > rec CT fall 2018  Started omeprazole 20 mg daily /zyretc/zantac/ tessalon just ran out  One day prior to OV  And cough some better rec Pantoprazole (protonix) 40 mg (double the prilosec is the same)   Take  30-60 min before first meal of the day and  Zantac 150 mg at bedtime until no cough for a week and if not better after 2 weeks then need to see [name] GERD diet     03/23/2018  Acute extended  ov/Joseph Maldonado re: recurrent cough  Chief Complaint  Patient presents with  . Acute Visit    cough x 6 wks- occ prod with clear sputum.    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 Follow-up    Cough is much improved but has not completely resolved.   Dyspnea:  Not limited by breathing from desired activities   Cough: still some daytime urge to clear throat Sleeping: fine on cpap rec Continue tessalon as needed  For drainage / throat tickle try take CHLORPHENIRAMINE  4 mg - take one every 4 hours as needed - available over the counter   Virtual Visit via Telephone Note 07/10/2019   I connected with Joseph Maldonado. on 07/10/19 at  8: 25 am  PM EDT  by telephone and verified that I am speaking with the correct person using two identifiers.   I discussed the limitations, risks, security and privacy concerns of performing an evaluation and management service by telephone and the availability of in person appointments. I also discussed with the patient that there may be a patient responsible charge related to this service. The patient expressed understanding and agreed to proceed.   History of Present Illness: re cough/ spn  Dyspnea:  Not limited by breathing from desired activities   Cough: daytime with sensation of thick drainage builds up but never brings any up  Sleeping: fine on cpap/no am flare of cough  SABA use: none     No obvious day to day or daytime variability or assoc excess/ purulent sputum or mucus plugs or hemoptysis or cp or chest tightness, subjective wheeze or overt sinus or hb symptoms.    Also denies any obvious fluctuation of symptoms  with weather or environmental changes or other aggravating or alleviating factors except as outlined above.   Meds reviewed/ med reconciliation completed        Observations/Objective: Sounds fine on the phone / no throat clearing or cough at am phone call    Assessment and Plan: See problem list for active a/p's   Follow Up Instructions: See avs for instructions unique to this ov which includes revised/ updated med list     I discussed the assessment and treatment plan with the patient. The patient was provided an opportunity to ask questions and all were answered. The patient agreed with the plan and demonstrated an understanding of the instructions.   The patient was advised to call back or seek an in-person evaluation if the symptoms worsen or if the condition fails to improve as anticipated.  I provided 25  minutes of non-face-to-face time during this encounter.   Christinia Gully, MD

## 2019-07-10 NOTE — Telephone Encounter (Signed)
error 

## 2019-08-25 ENCOUNTER — Other Ambulatory Visit: Payer: Self-pay | Admitting: Cardiology

## 2019-08-27 ENCOUNTER — Other Ambulatory Visit: Payer: Self-pay

## 2019-08-27 ENCOUNTER — Encounter: Payer: Self-pay | Admitting: Family Medicine

## 2019-08-27 ENCOUNTER — Ambulatory Visit (INDEPENDENT_AMBULATORY_CARE_PROVIDER_SITE_OTHER): Payer: Medicare Other | Admitting: Family Medicine

## 2019-08-27 VITALS — BP 115/67 | HR 53 | Temp 97.6°F | Wt 213.2 lb

## 2019-08-27 DIAGNOSIS — Z0000 Encounter for general adult medical examination without abnormal findings: Secondary | ICD-10-CM

## 2019-08-27 DIAGNOSIS — R7989 Other specified abnormal findings of blood chemistry: Secondary | ICD-10-CM

## 2019-08-27 DIAGNOSIS — N189 Chronic kidney disease, unspecified: Secondary | ICD-10-CM

## 2019-08-27 DIAGNOSIS — I2510 Atherosclerotic heart disease of native coronary artery without angina pectoris: Secondary | ICD-10-CM | POA: Diagnosis not present

## 2019-08-27 NOTE — Patient Instructions (Addendum)
I will recheck kidney function today.  Continue to stay hydrated. Low intensity exercise most days per week with a goal of 150 minutes/week.  Thanks for coming in today.  Follow-up in 6 months but let me know if there are questions sooner.    Preventive Care 72 Years and Older, Male Preventive care refers to lifestyle choices and visits with your health care provider that can promote health and wellness. This includes:  A yearly physical exam. This is also called an annual well check.  Regular dental and eye exams.  Immunizations.  Screening for certain conditions.  Healthy lifestyle choices, such as diet and exercise. What can I expect for my preventive care visit? Physical exam Your health care provider will check:  Height and weight. These may be used to calculate body mass index (BMI), which is a measurement that tells if you are at a healthy weight.  Heart rate and blood pressure.  Your skin for abnormal spots. Counseling Your health care provider may ask you questions about:  Alcohol, tobacco, and drug use.  Emotional well-being.  Home and relationship well-being.  Sexual activity.  Eating habits.  History of falls.  Memory and ability to understand (cognition).  Work and work Statistician. What immunizations do I need?  Influenza (flu) vaccine  This is recommended every year. Tetanus, diphtheria, and pertussis (Tdap) vaccine  You may need a Td booster every 10 years. Varicella (chickenpox) vaccine  You may need this vaccine if you have not already been vaccinated. Zoster (shingles) vaccine  You may need this after age 47. Pneumococcal conjugate (PCV13) vaccine  One dose is recommended after age 44. Pneumococcal polysaccharide (PPSV23) vaccine  One dose is recommended after age 35. Measles, mumps, and rubella (MMR) vaccine  You may need at least one dose of MMR if you were born in 1957 or later. You may also need a second  dose. Meningococcal conjugate (MenACWY) vaccine  You may need this if you have certain conditions. Hepatitis A vaccine  You may need this if you have certain conditions or if you travel or work in places where you may be exposed to hepatitis A. Hepatitis B vaccine  You may need this if you have certain conditions or if you travel or work in places where you may be exposed to hepatitis B. Haemophilus influenzae type b (Hib) vaccine  You may need this if you have certain conditions. You may receive vaccines as individual doses or as more than one vaccine together in one shot (combination vaccines). Talk with your health care provider about the risks and benefits of combination vaccines. What tests do I need? Blood tests  Lipid and cholesterol levels. These may be checked every 5 years, or more frequently depending on your overall health.  Hepatitis C test.  Hepatitis B test. Screening  Lung cancer screening. You may have this screening every year starting at age 60 if you have a 30-pack-year history of smoking and currently smoke or have quit within the past 15 years.  Colorectal cancer screening. All adults should have this screening starting at age 56 and continuing until age 46. Your health care provider may recommend screening at age 53 if you are at increased risk. You will have tests every 1-10 years, depending on your results and the type of screening test.  Prostate cancer screening. Recommendations will vary depending on your family history and other risks.  Diabetes screening. This is done by checking your blood sugar (glucose) after you have  not eaten for a while (fasting). You may have this done every 1-3 years.  Abdominal aortic aneurysm (AAA) screening. You may need this if you are a current or former smoker.  Sexually transmitted disease (STD) testing. Follow these instructions at home: Eating and drinking  Eat a diet that includes fresh fruits and vegetables, whole  grains, lean protein, and low-fat dairy products. Limit your intake of foods with high amounts of sugar, saturated fats, and salt.  Take vitamin and mineral supplements as recommended by your health care provider.  Do not drink alcohol if your health care provider tells you not to drink.  If you drink alcohol: ? Limit how much you have to 0-2 drinks a day. ? Be aware of how much alcohol is in your drink. In the [place] one drink equals one 12 oz bottle of beer (355 mL), one 5 oz glass of wine (148 mL), or one 1 oz glass of hard liquor (44 mL). Lifestyle  Take daily care of your teeth and gums.  Stay active. Exercise for at least 30 minutes on 5 or more days each week.  Do not use any products that contain nicotine or tobacco, such as cigarettes, e-cigarettes, and chewing tobacco. If you need help quitting, ask your health care provider.  If you are sexually active, practice safe sex. Use a condom or other form of protection to prevent STIs (sexually transmitted infections).  Talk with your health care provider about taking a low-dose aspirin or statin. What's next?  Visit your health care provider once a year for a well check visit.  Ask your health care provider how often you should have your eyes and teeth checked.  Stay up to date on all vaccines. This information is not intended to replace advice given to you by your health care provider. Make sure you discuss any questions you have with your health care provider. Document Released: 10/10/2015 Document Revised: 09/07/2018 Document Reviewed: 09/07/2018 Elsevier Patient Education  [employer].   If you have lab work done today you will be contacted with your lab results within the next 2 weeks.  If you have not heard from [name] then please contact us. The fastest way to get your results is to register for My Chart.   IF you received an x-ray today, you will receive an invoice from [facility]. Please contact  [facility] at [phone] with questions or concerns regarding your invoice.   IF you received labwork today, you will receive an invoice from [place]. Please contact [facility] at [phone] with questions or concerns regarding your invoice.   Our billing staff will not be able to assist you with questions regarding bills from these companies.  You will be contacted with the lab results as soon as they are available. The fastest way to get your results is to activate your My Chart account. Instructions are located on the last page of this paperwork. If you have not heard from [name] regarding the results in 2 weeks, please contact this office.

## 2019-08-27 NOTE — Progress Notes (Signed)
Subjective:  Patient ID: Joseph Parrot., male    DOB: 05-Dec-1945  Age: 74 y.o. MRN: [id number]  CC:  Chief Complaint  Patient presents with  . Annual Exam    annual medicare wellness with no issues at this time    HPI Joseph Maldonado. presents for  Annual wellness exam and recheck creatinine. Care team: Cardiology: Dr. Virgina Jock Pulmonary: Dr. Melvyn Novas Orthopedics: Dr. Mayer Camel Sleep/neuro: Dr. Rexene Alberts Dermatology: Dr. Elvera Lennox Nephrology: Dr. Carolin Sicks  Elevated creatinine Chronic kidney disease, thought to be due to hypertensive nephropathy and NSAID use in the past.  Range of 1.20-1.46 since 2018.  Slight increase at 1.55 at last visit September 14.  Maintenance of hydration, continued NSAID avoidance discussed.  He is on losartan, atenolol for hypertension. No recent nsaids. Drinking fluids. Urinating normally.   Cancer screening: Colonoscopy 09/27/2013, repeat 10 years reportedly.  Prostate: PSA obtained last visit, DRE deferred.  PSA 1.9 September 14  Immunization History  Administered Date(s) Administered  . Fluad Quad(high Dose 65+) 06/11/2019  . Influenza, High Dose Seasonal PF 07/26/2018  . Influenza,inj,Quad PF,6+ Mos 07/07/2016  . Influenza-Unspecified 07/13/2012, 10/01/2015, 07/05/2017  . Pneumococcal Conjugate-13 04/10/2014  . Pneumococcal Polysaccharide-23 12/17/2015  . Pneumococcal-Unspecified 09/27/2010  . Tdap 02/09/2015  . Zoster 09/27/2010  . Zoster Recombinat (Shingrix) 02/04/2017, 06/28/2017   Fall Risk  08/27/2019 06/11/2019 01/04/2019 12/05/2018 03/13/2018  Falls in the past year? '1 1 1 ' 0 No  Comment - - tripped on rug,tripped outside - -  Number falls in past yr: 0 0 1 0 -  Injury with Fall? 0 1 1 0 -  Comment - abrasion of the right knee. Trip over dog - - -  Risk Factor Category  - - - - -  Risk for fall due to : - Impaired balance/gait - - -  Risk for fall due to: Comment - - - - -  Follow up Falls evaluation completed Falls evaluation completed -  Falls evaluation completed -  few loose rugs at home - recommend removing.  Sufficient lighting.  No grab bars in bathroom.    Depression screen [facility] 2/9 08/27/2019 06/11/2019 01/04/2019 12/05/2018 03/13/2018  Decreased Interest 0 0 0 0 0  Down, Depressed, Hopeless 0 0 0 0 0  PHQ - 2 Score 0 0 0 0 0   Functional Status Survey: Is the patient deaf or have difficulty hearing?: Yes(hearing aid) Does the patient have difficulty seeing, even when wearing glasses/contacts?: No Does the patient have difficulty concentrating, remembering, or making decisions?: No Does the patient have difficulty walking or climbing stairs?: No Does the patient have difficulty dressing or bathing?: No Does the patient have difficulty doing errands alone such as visiting a doctor's office or shopping?: No  6CIT Screen 08/27/2019 01/04/2019 08/08/2017  What Year? 0 points 0 points 0 points  What month? 0 points 0 points 0 points  What time? 0 points 0 points 0 points  Count back from 20 0 points 0 points 0 points  Months in reverse 0 points 0 points 0 points  Repeat phrase 0 points 0 points 0 points  Total Score 0 0 0   Dental: [facility], appt in 2 weeks.   Exercise: bike riding prior, not recent. Bike has helped in past. Home projects.   Alcohol/tobacco: 3 beers per week.     Office Visit from 01/04/2019 in [facility] at [facility]  AUDIT-C Score  7     Advanced directives:  Information provided. Full code.  Hearing Screening   '125Hz'  '250Hz'  '500Hz'  '1000Hz'  '2000Hz'  '3000Hz'  '4000Hz'  '6000Hz'  '8000Hz'   Right ear:           Left ear:             Visual Acuity Screening   Right eye Left eye Both eyes  Without correction: '20/30 20/20 20/15 '  With correction:     appt scheduled with optho in coming year. Wears glasses.    History Patient Active Problem List   Diagnosis Date Noted  . OSA (obstructive sleep apnea) 06/01/2019  . Aneurysm of ascending aorta (HCC) 05/18/2019  . Coronary artery disease involving  native coronary artery of native heart without angina pectoris 05/18/2019  . Essential hypertension 05/18/2019  . History of total hip arthroplasty, right 08/14/2018  . Osteoarthritis of right hip 08/12/2018  . Palpitations 01/29/2018  . SCC (squamous cell carcinoma) 01/28/2018  . Solitary pulmonary nodule 10/03/2016  . Cough 09/29/2016  . Seborrheic keratosis 02/26/2014  . Rotator cuff tear arthropathy of right shoulder 01/10/2014  . 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 S/P total knee arthroplasty 10/05/2011  . 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 OA (osteoarthritis) of knee 03/01/2011  . CKD (chronic kidney disease) 05/13/2010   Past Medical History:  Diagnosis Date  . Arthritis   . CAD (coronary artery disease)   . Chronic kidney disease   . Complication of anesthesia    reports " i lose my blood pressure after anesthesia; the drops usually happens in recovery"   . Depression   . Hand pain    currently being evaluated by rheumatology for dx   . Hematuria   . Hypertension   . Rapid heart beat    rpeorts  " 2 months ago i had a rapid heart beat that lasted 45 min to an hour, ive experience this twice in the last t10 years but only seconds long" " i was evaluated by cardiology Dr Landry Mellow who did a 30 day monitor and ECHO and said i have occasional [PATS] ? , denies chest pain nor LOC  during these episodes; HR today is WDL   . Sleep apnea    CPAP    Past Surgical History:  Procedure Laterality Date  . CATARACT EXTRACTION, BILATERAL    . HIP SURGERY Left 2012  . JOINT REPLACEMENT    . REPLACEMENT TOTAL KNEE Right    2001,2006,2009,2013  . REPLACEMENT TOTAL KNEE Left 2011  . TOTAL HIP ARTHROPLASTY Right 08/14/2018   Procedure: TOTAL HIP ARTHROPLASTY ANTERIOR APPROACH;  Surgeon: Frederik Pear, MD;  Location: [facility];  Service: Orthopedics;  Laterality: Right;   Allergies  Allergen Reactions  . Nsaids     Affects creatinine  . Dilaudid  [Hydromorphone Hcl] Other (See Comments)    Suicidal   Prior to Admission medications   Medication Sig Start Date End Date Taking? Authorizing Provider  aspirin EC 81 MG tablet Take 81 mg by mouth daily.    [provider]  atenolol (TENORMIN) 25 MG tablet Take 1 tablet (25 mg total) by mouth daily. 05/18/19 08/16/19  Patwardhan, Reynold Bowen, MD  atorvastatin (LIPITOR) 20 MG tablet Take 1 tablet (20 mg total) by mouth daily. 03/05/19   Patwardhan, Reynold Bowen, MD  benzonatate (TESSALON) 200 MG capsule Take 1 capsule (200 mg total) by mouth 3 (three) times daily as needed for cough. 06/01/19   Lauraine Rinne, NP  buPROPion (WELLBUTRIN SR) 200 MG 12 hr tablet Take 2 tablets (  400 mg total) by mouth daily. 06/11/19   Wendie Agreste, MD  fluticasone (FLONASE) 50 MCG/ACT nasal spray Place 1 spray into both nostrils daily. 06/01/19   Lauraine Rinne, NP  gabapentin (NEURONTIN) 100 MG capsule Take 1 capsule (100 mg total) by mouth 3 (three) times daily. One three times daily 07/10/19   Tanda Rockers, MD  losartan (COZAAR) 100 MG tablet Take 1 tablet (100 mg total) by mouth daily. 05/18/19   Patwardhan, Reynold Bowen, MD  omeprazole (PRILOSEC) 20 MG capsule Take 1 capsule (20 mg total) by mouth 2 (two) times daily before a meal. 07/10/19   Tanda Rockers, MD   Social History   Socioeconomic History  . Marital status: Married    Spouse name: Not on file  . Number of children: 2  . Years of education: Not on file  . Highest education level: Bachelor's degree (e.g., BA, AB, BS)  Occupational History  . Occupation: Retired  Scientific laboratory technician  . Financial resource strain: Not hard at all  . Food insecurity    Worry: Never true    Inability: Never true  . Transportation needs    Medical: No    Non-medical: No  Tobacco Use  . Smoking status: Former Smoker    Packs/day: 0.50    Years: 3.00    Pack years: 1.50    Types: Cigarettes    Quit date: 1971    Years since quitting: 49.9  . Smokeless tobacco: Never  Used  Substance and Sexual Activity  . Alcohol use: Yes    Alcohol/week: 3.0 standard drinks    Types: 3 Cans of beer per week    Comment: max 3 beers/week  . Drug use: No    Comment: former user of marijuana, quit in 1971  . Sexual activity: Yes  Lifestyle  . Physical activity    Days per week: 0 days    Minutes per session: 0 min  . Stress: Not at all  Relationships  . Social connections    Talks on phone: More than three times a week    Gets together: Once a week    Attends religious service: More than 4 times per year    Active member of club or organization: Yes    Attends meetings of clubs or organizations: More than 4 times per year    Relationship status: Married  . Intimate partner violence    Fear of current or ex partner: No    Emotionally abused: No    Physically abused: No    Forced sexual activity: No  Other Topics Concern  . Not on file  Social History Narrative   Lives with his wife.   Education: College   Exercise: No   Drinks about 2 large coffees per day   Right handed      Spent many years in [place].  Moved to [place] to be near their children.  Daughter lives in [place].  Their son has lived in the [place] area with plans to move to [place] in summer 2019.    Review of Systems 13 point review of systems per patient health survey noted.  Negative other than as indicated above or in HPI.    Objective:   Vitals:   08/27/19 0829  BP: 115/67  Pulse: (!) 53  Temp: 97.6 F (36.4 C)  TempSrc: Oral  SpO2: 100%  Weight: 213 lb 3.2 oz (96.7 kg)    Physical Exam Vitals signs reviewed.  Constitutional:      Appearance: He is well-developed.  HENT:     Head: Normocephalic and atraumatic.     Right Ear: External ear normal.     Left Ear: External ear normal.  Eyes:     Conjunctiva/sclera: Conjunctivae normal.     Pupils: Pupils are equal, round, and reactive to light.  Neck:     Musculoskeletal: Normal range of motion and neck supple.      Thyroid: No thyromegaly.  Cardiovascular:     Rate and Rhythm: Normal rate and regular rhythm.     Heart sounds: Normal heart sounds.  Pulmonary:     Effort: Pulmonary effort is normal. No respiratory distress.     Breath sounds: Normal breath sounds. No wheezing.  Abdominal:     General: There is no distension.     Palpations: Abdomen is soft.     Tenderness: There is no abdominal tenderness.  Musculoskeletal: Normal range of motion.        General: No tenderness.  Lymphadenopathy:     Cervical: No cervical adenopathy.  Skin:    General: Skin is warm and dry.  Neurological:     Mental Status: He is alert and oriented to person, place, and time.     Deep Tendon Reflexes: Reflexes are normal and symmetric.  Psychiatric:        Behavior: Behavior normal.      Assessment & Plan:  Joseph Haik. is a 73 y.o. male . Annual physical exam  Chronic kidney disease, unspecified CKD stage - Plan: Basic metabolic panel  Elevated serum creatinine - Plan: Basic metabolic panel  Annual wellness visit in April.  Reviewed same components today without appreciable changes.  No concerns on physical exam.  Elevated creatinine slightly higher than previous baseline.  Continue hydration, recheck levels, may need follow-up with nephrology if persistent elevations.  Recheck 6 months.  No orders of the defined types were placed in this encounter.  Patient Instructions     I will recheck kidney function today.  Continue to stay hydrated. Low intensity exercise most days per week with a goal of 150 minutes/week.  Thanks for coming in today.  Follow-up in 6 months but let me know if there are questions sooner.    Preventive Care 12 Years and Older, Male Preventive care refers to lifestyle choices and visits with your health care provider that can promote health and wellness. This includes:  A yearly physical exam. This is also called an annual well check.  Regular dental and eye exams.   Immunizations.  Screening for certain conditions.  Healthy lifestyle choices, such as diet and exercise. What can I expect for my preventive care visit? Physical exam Your health care provider will check:  Height and weight. These may be used to calculate body mass index (BMI), which is a measurement that tells if you are at a healthy weight.  Heart rate and blood pressure.  Your skin for abnormal spots. Counseling Your health care provider may ask you questions about:  Alcohol, tobacco, and drug use.  Emotional well-being.  Home and relationship well-being.  Sexual activity.  Eating habits.  History of falls.  Memory and ability to understand (cognition).  Work and work Statistician. What immunizations do I need?  Influenza (flu) vaccine  This is recommended every year. Tetanus, diphtheria, and pertussis (Tdap) vaccine  You may need a Td booster every 10 years. Varicella (chickenpox) vaccine  You may need this vaccine if you have  not already been vaccinated. Zoster (shingles) vaccine  You may need this after age 73. Pneumococcal conjugate (PCV13) vaccine  One dose is recommended after age 24. Pneumococcal polysaccharide (PPSV23) vaccine  One dose is recommended after age 22. Measles, mumps, and rubella (MMR) vaccine  You may need at least one dose of MMR if you were born in 1957 or later. You may also need a second dose. Meningococcal conjugate (MenACWY) vaccine  You may need this if you have certain conditions. Hepatitis A vaccine  You may need this if you have certain conditions or if you travel or work in places where you may be exposed to hepatitis A. Hepatitis B vaccine  You may need this if you have certain conditions or if you travel or work in places where you may be exposed to hepatitis B. Haemophilus influenzae type b (Hib) vaccine  You may need this if you have certain conditions. You may receive vaccines as individual doses or as more  than one vaccine together in one shot (combination vaccines). Talk with your health care provider about the risks and benefits of combination vaccines. What tests do I need? Blood tests  Lipid and cholesterol levels. These may be checked every 5 years, or more frequently depending on your overall health.  Hepatitis C test.  Hepatitis B test. Screening  Lung cancer screening. You may have this screening every year starting at age 75 if you have a 30-pack-year history of smoking and currently smoke or have quit within the past 15 years.  Colorectal cancer screening. All adults should have this screening starting at age 44 and continuing until age 88. Your health care provider may recommend screening at age 32 if you are at increased risk. You will have tests every 1-10 years, depending on your results and the type of screening test.  Prostate cancer screening. Recommendations will vary depending on your family history and other risks.  Diabetes screening. This is done by checking your blood sugar (glucose) after you have not eaten for a while (fasting). You may have this done every 1-3 years.  Abdominal aortic aneurysm (AAA) screening. You may need this if you are a current or former smoker.  Sexually transmitted disease (STD) testing. Follow these instructions at home: Eating and drinking  Eat a diet that includes fresh fruits and vegetables, whole grains, lean protein, and low-fat dairy products. Limit your intake of foods with high amounts of sugar, saturated fats, and salt.  Take vitamin and mineral supplements as recommended by your health care provider.  Do not drink alcohol if your health care provider tells you not to drink.  If you drink alcohol: ? Limit how much you have to 0-2 drinks a day. ? Be aware of how much alcohol is in your drink. In the [place] one drink equals one 12 oz bottle of beer (355 mL), one 5 oz glass of wine (148 mL), or one 1 oz glass of hard liquor (44  mL). Lifestyle  Take daily care of your teeth and gums.  Stay active. Exercise for at least 30 minutes on 5 or more days each week.  Do not use any products that contain nicotine or tobacco, such as cigarettes, e-cigarettes, and chewing tobacco. If you need help quitting, ask your health care provider.  If you are sexually active, practice safe sex. Use a condom or other form of protection to prevent STIs (sexually transmitted infections).  Talk with your health care provider about taking a low-dose aspirin or  statin. What's next?  Visit your health care provider once a year for a well check visit.  Ask your health care provider how often you should have your eyes and teeth checked.  Stay up to date on all vaccines. This information is not intended to replace advice given to you by your health care provider. Make sure you discuss any questions you have with your health care provider. Document Released: 10/10/2015 Document Revised: 09/07/2018 Document Reviewed: 09/07/2018 Elsevier Patient Education  [employer].   If you have lab work done today you will be contacted with your lab results within the next 2 weeks.  If you have not heard from [name] then please contact us. The fastest way to get your results is to register for My Chart.   IF you received an x-ray today, you will receive an invoice from [facility]. Please contact [facility] Radiology at [phone] with questions or concerns regarding your invoice.   IF you received labwork today, you will receive an invoice from [place]. Please contact [facility] at [phone] with questions or concerns regarding your invoice.   Our billing staff will not be able to assist you with questions regarding bills from these companies.  You will be contacted with the lab results as soon as they are available. The fastest way to get your results is to activate your My Chart account. Instructions are located on the last page of  this paperwork. If you have not heard from [name] regarding the results in 2 weeks, please contact this office.          Signed, [name], MD [facility]

## 2019-08-28 LAB — BASIC METABOLIC PANEL
BUN/Creatinine Ratio: 22 (ref 10–24)
BUN: 37 mg/dL — ABNORMAL HIGH (ref 8–27)
CO2: 16 mmol/L — ABNORMAL LOW (ref 20–29)
Calcium: 9.6 mg/dL (ref 8.6–10.2)
Chloride: 108 mmol/L — ABNORMAL HIGH (ref 96–106)
Creatinine, Ser: 1.7 mg/dL — ABNORMAL HIGH (ref 0.76–1.27)
GFR calc Af Amer: 46 mL/min/{1.73_m2} — ABNORMAL LOW (ref >59)
GFR calc non Af Amer: 39 mL/min/{1.73_m2} — ABNORMAL LOW (ref >59)
Glucose: 84 mg/dL (ref 65–99)
Potassium: 4.4 mmol/L (ref 3.5–5.2)
Sodium: 141 mmol/L (ref 134–144)

## 2019-09-15 ENCOUNTER — Other Ambulatory Visit: Payer: Self-pay | Admitting: Cardiology

## 2019-09-15 DIAGNOSIS — I10 Essential (primary) hypertension: Secondary | ICD-10-CM

## 2019-09-15 DIAGNOSIS — I712 Thoracic aortic aneurysm, without rupture: Secondary | ICD-10-CM

## 2019-09-15 DIAGNOSIS — I7121 Aneurysm of the ascending aorta, without rupture: Secondary | ICD-10-CM

## 2019-09-24 ENCOUNTER — Telehealth: Payer: Self-pay | Admitting: Internal Medicine

## 2019-09-24 MED ORDER — BENZONATATE 200 MG PO CAPS: 200.0000 mg | ORAL_CAPSULE | Freq: Three times a day (TID) | ORAL | 0 refills | Status: DC | PRN

## 2019-09-24 MED ORDER — PANTOPRAZOLE SODIUM 20 MG PO TBEC: 20.0000 mg | DELAYED_RELEASE_TABLET | Freq: Every day | ORAL | 0 refills | Status: DC

## 2019-09-24 NOTE — Telephone Encounter (Signed)
Spoke with patient. He was requesting a refill on pantoprazole and tessalon perles.   Last AVS: "Omeprazole 20 mg Take 30- 60 min before your first and last meals of the day    For drainage / throat tickle try change your antihistamine to  CHLORPHENIRAMINE  4 mg  (Chlortab 4mg   at [employer] should be easiest to find in the green box)  take one every 4 hours as needed - available over the counter- may cause drowsiness so start with just a bedtime dose or two and see how you tolerate it before trying in daytime    If not doing better >>>   Your choice is return to return to Dr [employer] team  Or start gabapentin 100 mg three times daily   If the gabapentin helps and you need to refill it, we need to see you before the refills run out (3 months)"  When I asked him about the omeprazole, he stated that he has to take it twice a day for it to work. With the pantoprazole, he can take it once a day and it works well.   Pharmacy is [facility] on [address].   MW, please advise if you are ok with the refill on pantoprazole and tessalon perles. Thanks!

## 2019-09-24 NOTE — Telephone Encounter (Signed)
Spoke with the pt and notified of recs per MW  He verbalized understanding  Rxs refilled and appt scheduled

## 2019-09-24 NOTE — Telephone Encounter (Signed)
Ok x one refill and ov with all meds in hand before any more to regroup re long term rx

## 2019-09-25 ENCOUNTER — Ambulatory Visit: Payer: Medicare Other | Admitting: Internal Medicine

## 2019-10-08 DIAGNOSIS — D692 Other nonthrombocytopenic purpura: Secondary | ICD-10-CM | POA: Diagnosis not present

## 2019-10-08 DIAGNOSIS — L821 Other seborrheic keratosis: Secondary | ICD-10-CM | POA: Diagnosis not present

## 2019-10-08 DIAGNOSIS — L814 Other melanin hyperpigmentation: Secondary | ICD-10-CM | POA: Diagnosis not present

## 2019-10-08 DIAGNOSIS — D225 Melanocytic nevi of trunk: Secondary | ICD-10-CM | POA: Diagnosis not present

## 2019-10-08 DIAGNOSIS — D2271 Melanocytic nevi of right lower limb, including hip: Secondary | ICD-10-CM | POA: Diagnosis not present

## 2019-10-19 ENCOUNTER — Other Ambulatory Visit: Payer: Self-pay

## 2019-10-19 ENCOUNTER — Encounter: Payer: Self-pay | Admitting: Internal Medicine

## 2019-10-19 ENCOUNTER — Ambulatory Visit (INDEPENDENT_AMBULATORY_CARE_PROVIDER_SITE_OTHER): Payer: Medicare Other | Admitting: Internal Medicine

## 2019-10-19 DIAGNOSIS — R05 Cough: Secondary | ICD-10-CM

## 2019-10-19 DIAGNOSIS — R059 Cough, unspecified: Secondary | ICD-10-CM

## 2019-10-19 MED ORDER — PANTOPRAZOLE SODIUM 20 MG PO TBEC: 20.0000 mg | DELAYED_RELEASE_TABLET | Freq: Every day | ORAL | 5 refills | Status: DC

## 2019-10-19 NOTE — Assessment & Plan Note (Signed)
W/u for asthma [facility] 2008 > neg Max gerd rx 09/29/2016 > resolved  - recurred around Mid may 2019 s antecedent uri  - CT chest 04/06/18 spn only > see sep a/p - MRI sinus  04/06/2018 Normal paranasal sinuses 04/18/2018 added 1st gen H1 blockers per guidelines    Allergy profile 04/18/2018 >  Eos 0.4 /  IgE 16  RAST pos dust and timothy grass   - 04/20/2018 referred to ENT/ Dr Joya Gaskins saw Joseph Maldonado instead rec cough suppression/ consider neuromodulator rx  - 07/10/2019 try 1st gen H1 blockers per guidelines> could not tolerate drowsiness    Adequate control on present rx, reviewed in detail with pt > no change in rx needed    Pt informed of the seriousness of COVID 19 infection as a direct risk to lung health  and safey and to close contacts and should continue to wear a facemask in public and minimize exposure to public locations but especially avoid any area or activity where non-close contacts are not observing distancing or wearing an appropriate face mask.  I strongly recommended vaccine when offered.    Each maintenance medication was reviewed in detail including most importantly the difference between maintenance and as needed and under what circumstances the prns are to be used.  Please see AVS for specific  Instructions which are unique to this visit and I personally typed out  which were reviewed in detail over the phone with the patient and a copy provided via MyChart

## 2019-10-19 NOTE — Progress Notes (Signed)
Subjective:     Patient ID: Joseph Maldonado, male   DOB: 1946/01/21,    MRN: [id number]    Brief patient profile:  74 yowm minimal smoking hx quit age 74 with recurrent pattern of cough going back to around 2007/8 while living [place] with neg w/u for asthma but persistent pattern since then about  q 2 year severe cough so referred to pulmonary clinic 09/29/2016 by Dr   [name].   History of Present Illness  09/29/2016 [address] [facility] office visit/ Anabeth Chilcott   Chief Complaint  Patient presents with  . Pulm Consult    Unproductive cough since Nov. 2017. Usually gets this every year but this round isn't responding to medications. No SOB.   onset was acute p dust exposure in mid Nov 2017 day >> noct dry > wet and last exp to dry wall dust mid December  [facility] did CT of bladder and kidneys and found calcium  deposit > rec CT fall 2018  Started omeprazole 20 mg daily /zyretc/zantac/ tessalon just ran out  One day prior to OV  And cough some better rec Pantoprazole (protonix) 40 mg (double the prilosec is the same)   Take  30-60 min before first meal of the day and  Zantac 150 mg at bedtime until no cough for a week and if not better after 2 weeks then need to see [name] GERD diet     03/23/2018  Acute extended  ov/Katriana Dortch re: recurrent cough  Chief Complaint  Patient presents with  . Acute Visit    cough x 6 wks- occ prod with clear sputum.    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 Follow-up    Cough is much improved but has not completely resolved.   Dyspnea:  Not limited by breathing from desired activities   Cough: still some daytime urge to clear throat Sleeping: fine on cpap rec Continue tessalon as needed  For drainage / throat tickle try take CHLORPHENIRAMINE  4 mg - take one every 4 hours as needed - available over the counter   Virtual Visit via Telephone Note 07/10/2019   I connected with Joseph Maldonado. on 07/10/19 at  8: 25 am  PM EDT  by telephone and verified that I am speaking with the correct person using two identifiers.   I discussed the limitations, risks, security and privacy concerns of performing an evaluation and management service by telephone and the availability of in person appointments. I also discussed with the patient that there may be a patient responsible charge related to this service. The patient expressed understanding and agreed to proceed.   History of Present Illness: re cough/ spn  Dyspnea:  Not limited by breathing from desired activities   Cough: daytime with sensation of thick drainage builds up but never brings any up  Sleeping: fine on cpap/no am flare of cough  SABA use: none rec Omeprazole 20 mg Take 30- 60 min before your first and last meals of the day  For drainage / throat tickle try change your antihistamine to  CHLORPHENIRAMINE  4 mg If not doing better >>>   Your choice is return to return to Dr  Bettina Gavia team  Or start gabapentin 100 mg three times daily    Virtual Visit via Telephone Note 10/19/2019   I connected with Joseph Maldonado. on 10/19/19 at  3:45 PM EST by telephone and verified that I am speaking with the correct person using two identifiers.   I discussed the limitations, risks, security and privacy concerns of performing an evaluation and management service by telephone and the availability of in person appointments. I also discussed with the patient that there may be a patient responsible charge related to this service. The patient expressed understanding and agreed to proceed.   History of Present Illness: cough/spn  maint on  protonix 20 mg one ac / can't tol 1st gen h1 so takes allegra otc   Dyspnea:  Not limited by breathing from desired activities   Cough: better to his satisfaction / no longer needing tessalon Sleeping: fine on cpap  SABA use: none  02: none    No obvious day to day or daytime variability or assoc excess/ purulent sputum or mucus plugs or hemoptysis or cp or chest tightness, subjective wheeze or overt sinus or hb symptoms.    Also denies any obvious fluctuation of symptoms with weather or environmental changes or other aggravating or alleviating factors except as outlined above.   Meds reviewed/ med reconciliation completed        Observations/Objective: Sounds good / no spont cough, nl phonation, no conversational sob    Assessment and Plan: See problem list for active a/p's   Follow Up Instructions: See avs for instructions unique to this ov which includes revised/ updated med list     I discussed the assessment and treatment plan with the patient. The patient was provided an opportunity to ask questions and all were answered. The patient agreed with the plan and demonstrated an understanding of the instructions.   The patient was advised to call back or seek an in-person evaluation if the symptoms worsen or if the condition  fails to improve as anticipated.  I provided 12 minutes of non-face-to-face time during this encounter.   Christinia Gully, MD

## 2019-10-19 NOTE — Patient Instructions (Signed)
No change in medications    Please schedule a follow up visit in 6  months but call sooner if needed  

## 2019-10-29 ENCOUNTER — Ambulatory Visit: Payer: Medicare Other

## 2019-11-04 ENCOUNTER — Ambulatory Visit: Payer: Medicare Other | Attending: Internal Medicine

## 2019-11-04 DIAGNOSIS — Z23 Encounter for immunization: Secondary | ICD-10-CM | POA: Insufficient documentation

## 2019-11-04 NOTE — Progress Notes (Signed)
   [id number]  Name:  Ralik Herrig.    MRN: [id number] DOB: 1946/09/17  11/04/2019  Mr. Perrow was observed post Covid-19 immunization for 30 minutes based on pre-vaccination screening without incidence. He was provided with Vaccine Information Sheet and instruction to access the V-Safe system.   Mr. Tolman was instructed to call 911 with any severe reactions post vaccine: Marland Kitchen Difficulty breathing  . Swelling of your face and throat  . A fast heartbeat  . A bad rash all over your body  . Dizziness and weakness    Immunizations Administered    Name Date Dose VIS Date Route   Pfizer COVID-19 Vaccine 11/04/2019 10:30 AM 0.3 mL 09/07/2019 Intramuscular   Manufacturer: [place]   Lot: [id number]   [place]: [id number]

## 2019-11-19 ENCOUNTER — Ambulatory Visit: Payer: Medicare Other

## 2019-11-28 ENCOUNTER — Ambulatory Visit: Payer: Medicare Other

## 2019-11-28 ENCOUNTER — Ambulatory Visit: Payer: Medicare Other | Attending: Internal Medicine

## 2019-11-28 DIAGNOSIS — Z23 Encounter for immunization: Secondary | ICD-10-CM | POA: Insufficient documentation

## 2019-11-28 NOTE — Progress Notes (Signed)
   [id number]ination [facility]  Name:  Joseph Maldonado.    MRN: [id number] DOB: 11-26-1945  11/28/2019  [name] was observed post Covid-19 immunization for 15 minutes without incident. He was provided with Vaccine Information Sheet and instruction to access the V-Safe system.   [name] was instructed to call 911 with any severe reactions post vaccine: Marland Kitchen Difficulty breathing  . Swelling of face and throat  . A fast heartbeat  . A bad rash all over body  . Dizziness and weakness   Immunizations Administered    Name Date Dose VIS Date Route   Pfizer COVID-19 Vaccine 11/28/2019  8:27 AM 0.3 mL 09/07/2019 Intramuscular   Manufacturer: [place]   Lot: [id number]   [place]: [id number]

## 2019-11-30 ENCOUNTER — Other Ambulatory Visit: Payer: Self-pay | Admitting: Family Medicine

## 2019-11-30 DIAGNOSIS — G4700 Insomnia, unspecified: Secondary | ICD-10-CM

## 2019-11-30 NOTE — Telephone Encounter (Signed)
Requested Prescriptions  Pending Prescriptions Disp Refills  . buPROPion (WELLBUTRIN SR) 200 MG 12 hr tablet [Pharmacy Med Name: BUPROPION SR 200MG  TABLETS (12 HR)] 180 tablet 1    Sig: TAKE 2 TABLETS(400 MG) BY MOUTH DAILY     Psychiatry: Antidepressants - bupropion Passed - 11/30/2019 10:01 AM      Passed - Last BP in normal range    BP Readings from Last 1 Encounters:  08/27/19 115/67         Passed - Valid encounter within last 6 months    Recent Outpatient Visits          3 months ago Annual physical exam   [facility] [place], MD   5 months ago Essential hypertension   [facility], Ranell Patrick, MD   11 months ago Medicare annual wellness visit, subsequent   [facility], Rex Kras, MD   12 months ago Insomnia, unspecified type   [facility], Ranell Patrick, MD   1 year ago Cough   [facility], Ranell Patrick, MD      Future Appointments            In 2 months Carlota Raspberry Ranell Patrick, MD [facility] [place], [facility]   In 5 months Patwardhan, Reynold Bowen, MD [facility]

## 2019-12-29 DIAGNOSIS — R0781 Pleurodynia: Secondary | ICD-10-CM | POA: Diagnosis not present

## 2020-01-01 ENCOUNTER — Ambulatory Visit (INDEPENDENT_AMBULATORY_CARE_PROVIDER_SITE_OTHER): Payer: Medicare Other | Admitting: Family Medicine

## 2020-01-01 ENCOUNTER — Other Ambulatory Visit: Payer: Self-pay

## 2020-01-01 ENCOUNTER — Encounter: Payer: Self-pay | Admitting: Family Medicine

## 2020-01-01 VITALS — BP 123/71 | HR 68 | Temp 97.8°F | Ht 71.0 in | Wt 217.2 lb

## 2020-01-01 DIAGNOSIS — N179 Acute kidney failure, unspecified: Secondary | ICD-10-CM

## 2020-01-01 DIAGNOSIS — N1832 Chronic kidney disease, stage 3b: Secondary | ICD-10-CM

## 2020-01-01 DIAGNOSIS — R0781 Pleurodynia: Secondary | ICD-10-CM

## 2020-01-01 LAB — BASIC METABOLIC PANEL
BUN/Creatinine Ratio: 21 (ref 10–24)
BUN: 36 mg/dL — ABNORMAL HIGH (ref 8–27)
CO2: 20 mmol/L (ref 20–29)
Calcium: 10.1 mg/dL (ref 8.6–10.2)
Chloride: 104 mmol/L (ref 96–106)
Creatinine, Ser: 1.7 mg/dL — ABNORMAL HIGH (ref 0.76–1.27)
GFR calc Af Amer: 46 mL/min/{1.73_m2} — ABNORMAL LOW (ref >59)
GFR calc non Af Amer: 39 mL/min/{1.73_m2} — ABNORMAL LOW (ref >59)
Glucose: 102 mg/dL — ABNORMAL HIGH (ref 65–99)
Potassium: 4.8 mmol/L (ref 3.5–5.2)
Sodium: 139 mmol/L (ref 134–144)

## 2020-01-01 MED ORDER — LIDOCAINE 5 % EX PTCH: 1.0000 | MEDICATED_PATCH | CUTANEOUS | 0 refills | Status: DC

## 2020-01-01 NOTE — Progress Notes (Signed)
Established Patient Office Visit  Subjective:  Patient ID: Joseph Maros., male    DOB: [birth year]  Age: 74 y.o. MRN: [id number]  CC:  Chief Complaint  Patient presents with  . Fall    Pt stated that he feel on 12/28/2019 while he was carring something into the house and in the Rt knee the was this all of a sudden jerk that cause his knee to give out and he fell.     HPI Joseph Maldonado. presents for   Patient states that the knee was buckling since his right knee replacement and revisions. He reports that on 12/28/19 he felt onto the sharp edge of an object. The knee is sore on the right He states that EMT came out because he was concerned that he broke a rib but they checked and helped him up from the floor.  He decided not to go to the hospital.  The following morning he went to [facility].  He took tramadol for the pain at night.   On tramadol he does not get relief from the pain.  The first night was awful.  The second night he was struggling to sleep. He took a half an [place] 5mg  which helped.  He is going to see Dr. Mayer Camel for his knee.  He wants to see if he can get some help with his pain in his ribs.   Past Medical History:  Diagnosis Date  . Arthritis   . CAD (coronary artery disease)   . Chronic kidney disease   . Complication of anesthesia    reports " i lose my blood pressure after anesthesia; the drops usually happens in recovery"   . Depression   . Hand pain    currently being evaluated by rheumatology for dx   . Hematuria   . Hypertension   . Rapid heart beat    rpeorts  " 2 months ago i had a rapid heart beat that lasted 45 min to an hour, ive experience this twice in the last t10 years but only seconds long" " i was evaluated by cardiology Dr Landry Mellow who did a 30 day monitor and ECHO and said i have occasional [PATS] ? , denies chest pain nor LOC  during these episodes; HR today is WDL   . Sleep apnea    CPAP     Past Surgical  History:  Procedure Laterality Date  . CATARACT EXTRACTION, BILATERAL    . HIP SURGERY Left 2012  . JOINT REPLACEMENT    . REPLACEMENT TOTAL KNEE Right    2001,2006,2009,2013  . REPLACEMENT TOTAL KNEE Left 2011  . TOTAL HIP ARTHROPLASTY Right 08/14/2018   Procedure: TOTAL HIP ARTHROPLASTY ANTERIOR APPROACH;  Surgeon: Frederik Pear, MD;  Location: [facility];  Service: Orthopedics;  Laterality: Right;    Family History  Problem Relation Age of Onset  . Cancer Mother        throat  . Hypertension Mother   . Heart disease Mother   . Emphysema Mother   . Hypertension Father   . Cancer Father        Brain  . Hypertension Sister   . Hematuria Sister   . Hematuria Brother     Social History   Socioeconomic History  . Marital status: Married    Spouse name: Not on file  . Number of children: 2  . Years of education: Not on file  . Highest education level: Bachelor's degree (e.g., BA, AB,  BS)  Occupational History  . Occupation: Retired  Tobacco Use  . Smoking status: Former Smoker    Packs/day: 0.50    Years: 3.00    Pack years: 1.50    Types: Cigarettes    Quit date: 1971    Years since quitting: 50.2  . Smokeless tobacco: Never Used  Substance and Sexual Activity  . Alcohol use: Yes    Alcohol/week: 3.0 standard drinks    Types: 3 Cans of beer per week    Comment: max 3 beers/week  . Drug use: No    Comment: former user of marijuana, quit in 1971  . Sexual activity: Yes  Other Topics Concern  . Not on file  Social History Narrative   Lives with his wife.   Education: College   Exercise: No   Drinks about 2 large coffees per day   Right handed      Spent many years in [place].  Moved to [place] to be near their children.  Daughter lives in [place].  Their son has lived in the [place] area with plans to move to [place] in summer 2019.   Social Determinants of Health   Financial Resource Strain:   . Difficulty of Paying Living Expenses:   Food Insecurity:   .  Worried About Charity fundraiser in the Last Year:   . Arboriculturist in the Last Year:   Transportation Needs:   . Film/video editor (Medical):   Marland Kitchen Lack of Transportation (Non-Medical):   Physical Activity:   . Days of Exercise per Week:   . Minutes of Exercise per Session:   Stress:   . Feeling of Stress :   Social Connections:   . Frequency of Communication with Friends and Family:   . Frequency of Social Gatherings with Friends and Family:   . Attends Religious Services:   . Active Member of Clubs or Organizations:   . Attends Archivist Meetings:   Marland Kitchen Marital Status:   Intimate Partner Violence:   . Fear of Current or Ex-Partner:   . Emotionally Abused:   Marland Kitchen Physically Abused:   . Sexually Abused:     Outpatient Medications Prior to Visit  Medication Sig Dispense Refill  . aspirin EC 81 MG tablet Take 81 mg by mouth daily.    Marland Kitchen atenolol (TENORMIN) 25 MG tablet TAKE 1 TABLET(25 MG) BY MOUTH DAILY 30 tablet 3  . atorvastatin (LIPITOR) 20 MG tablet TAKE 1 TABLET(20 MG) BY MOUTH DAILY 90 tablet 1  . benzonatate (TESSALON) 200 MG capsule Take 1 capsule (200 mg total) by mouth 3 (three) times daily as needed for cough. 90 capsule 0  . buPROPion (WELLBUTRIN SR) 200 MG 12 hr tablet TAKE 2 TABLETS(400 MG) BY MOUTH DAILY 180 tablet 1  . fluticasone (FLONASE) 50 MCG/ACT nasal spray Place 1 spray into both nostrils daily. 16 g 6  . losartan (COZAAR) 100 MG tablet Take 1 tablet (100 mg total) by mouth daily. 90 tablet 3  . pantoprazole (PROTONIX) 20 MG tablet Take 1 tablet (20 mg total) by mouth daily. 30 tablet 5   No facility-administered medications prior to visit.    Allergies  Allergen Reactions  . Nsaids     Affects creatinine  . Dilaudid [Hydromorphone Hcl] Other (See Comments)    Suicidal    ROS Review of Systems Review of Systems  Constitutional: Negative for activity change, appetite change, chills and fever.  HENT: Negative for congestion,  nosebleeds, trouble swallowing and voice change.   Respiratory: Negative for cough, shortness of breath and wheezing.   Gastrointestinal: Negative for diarrhea, nausea and vomiting.  Genitourinary: Negative for difficulty urinating, dysuria, flank pain and hematuria.  Musculoskeletal: Negative for back pain, joint swelling and neck pain.  Neurological: Negative for dizziness, speech difficulty, light-headedness and numbness.  See HPI. All other review of systems negative.     Objective:    Physical Exam  BP 123/71 (BP Location: Right Arm, Patient Position: Sitting, Cuff Size: Normal)   Pulse 68   Temp 97.8 F (36.6 C) (Temporal)   Ht 5\' 11"  (1.803 m)   Wt 217 lb 3.2 oz (98.5 kg)   SpO2 97%   BMI 30.29 kg/m  Wt Readings from Last 3 Encounters:  01/01/20 217 lb 3.2 oz (98.5 kg)  08/27/19 213 lb 3.2 oz (96.7 kg)  06/11/19 209 lb 12.8 oz (95.2 kg)   Physical Exam  Constitutional: Oriented to person, place, and time. Appears well-developed and well-nourished.  HENT:  Head: Normocephalic and atraumatic.  Eyes: Conjunctivae and EOM are normal.  Cardiovascular: Normal rate, regular rhythm, normal heart sounds and intact distal pulses.  No murmur heard. Pulmonary/Chest: Effort normal and breath sounds normal. No stridor. No respiratory distress. Has no wheezes.  Neurological: Is alert and oriented to person, place, and time.  Skin: Skin is warm. Capillary refill takes less than 2 seconds.  Psychiatric: Has a normal mood and affect. Behavior is normal. Judgment and thought content normal.   Bruising on the right torso at the nipple line  Tenderness to palpation on the right at the nipple line to the axillae   There are no preventive care reminders to display for this patient.  There are no preventive care reminders to display for this patient.  Lab Results  Component Value Date   TSH 3.320 01/30/2018   Lab Results  Component Value Date   WBC 18.1 (H) 08/15/2018   HGB 11.2  (L) 08/15/2018   HCT 35.5 (L) 08/15/2018   MCV 96.7 08/15/2018   PLT 182 08/15/2018   Lab Results  Component Value Date   NA 141 08/27/2019   K 4.4 08/27/2019   CO2 16 (L) 08/27/2019   GLUCOSE 84 08/27/2019   BUN 37 (H) 08/27/2019   CREATININE 1.70 (H) 08/27/2019   BILITOT 0.3 06/11/2019   ALKPHOS 93 06/11/2019   AST 15 06/11/2019   ALT 15 06/11/2019   PROT 6.1 06/11/2019   ALBUMIN 4.4 06/11/2019   CALCIUM 9.6 08/27/2019   ANIONGAP 7 08/15/2018   Lab Results  Component Value Date   CHOL 117 06/11/2019   Lab Results  Component Value Date   HDL 54 06/11/2019   Lab Results  Component Value Date   LDLCALC 45 06/11/2019   Lab Results  Component Value Date   TRIG 96 06/11/2019   Lab Results  Component Value Date   CHOLHDL 2.2 06/11/2019   No results found for: HGBA1C    Assessment & Plan:   Problem List Items Addressed This Visit    None    Visit Diagnoses    Rib pain on right side    -  Primary   Acute renal failure superimposed on stage 3b chronic kidney disease, unspecified acute renal failure type ([place])       Relevant Orders   Basic metabolic panel      Reviewed renal function and patient has steadily increasing creatinine thus will avoid nsaids Advised to increase  tramadol from 50mg  to 100mg  for pain If that does not help pain to discuss with Dr. Carlota Raspberry Will reassess creatinine today Advised Lidocaine 5% patch and if that is not covered by insurance would advised lidocaine 4% patch over the counter.  Continue to take deep breaths to avoid atelectasis. Pt is very aware of this.   Meds ordered this encounter  Medications  . lidocaine (LIDODERM) 5 %    Sig: Place 1 patch onto the skin daily. Remove & Discard patch within 12 hours or as directed by MD    Dispense:  30 patch    Refill:  0    Follow-up: No follow-ups on file.    Forrest Moron, MD

## 2020-01-01 NOTE — Patient Instructions (Addendum)
If lidocaine 5% patches are not covered please try the Aspercreme with lidocaine over the counter patch Continue tylenol for pain Avoid NSAIDs Will follow up with the Creatinine Follow up with Dr. Carlota Raspberry in one month    If you have lab work done today you will be contacted with your lab results within the next 2 weeks.  If you have not heard from [name] then please contact us. The fastest way to get your results is to register for My Chart.   IF you received an x-ray today, you will receive an invoice from [facility]. Please contact [facility] at [phone] with questions or concerns regarding your invoice.   IF you received labwork today, you will receive an invoice from [place]. Please contact [facility] at [phone] with questions or concerns regarding your invoice.   Our billing staff will not be able to assist you with questions regarding bills from these companies.  You will be contacted with the lab results as soon as they are available. The fastest way to get your results is to activate your My Chart account. Instructions are located on the last page of this paperwork. If you have not heard from [name] regarding the results in 2 weeks, please contact this office.     Rib Contusion A rib contusion is a deep bruise on your rib area. Contusions are the result of a blunt trauma that causes bleeding and injury to the tissues under the skin. A rib contusion may involve bruising of the ribs and of the skin and muscles in the area. The skin over the contusion may turn blue, purple, or yellow. Minor injuries will give you a painless contusion. More severe contusions may stay painful and swollen for a few weeks. What are the causes? This condition is usually caused by a blow, trauma, or direct force to an area of the body. This often occurs while playing contact sports. What are the signs or symptoms? Symptoms of this condition include:  Swelling and redness of the injured  area.  Discoloration of the injured area.  Tenderness and soreness of the injured area.  Pain with or without movement. How is this diagnosed? This condition may be diagnosed based on:  Your symptoms and medical history.  A physical exam.  Imaging tests--such as an X-ray, CT scan, or MRI--to determine if there were internal injuries or broken bones (fractures). How is this treated? This condition may be treated with:  Rest. This is often the best treatment for a rib contusion.  Icing. This reduces swelling and inflammation.  Deep-breathing exercises. These may be recommended to reduce the risk for lung collapse and pneumonia.  Medicines. Over-the-counter or prescription medicines may be given to control pain.  Injection of a numbing medicine around the nerve near your injury (nerve block). Follow these instructions at home:     Medicines  Take over-the-counter and prescription medicines only as told by your health care provider.  Do not drive or use heavy machinery while taking prescription pain medicine.  If you are taking prescription pain medicine, take actions to prevent or treat constipation. Your health care provider may recommend that you: ? Drink enough fluid to keep your urine pale yellow. ? Eat foods that are high in fiber, such as fresh fruits and vegetables, whole grains, and beans. ? Limit foods that are high in fat and processed sugars, such as fried or sweet foods. ? Take an over-the-counter or prescription medicine for constipation. Managing pain, stiffness, and swelling  If directed, put ice on the injured area: ? Put ice in a plastic bag. ? Place a towel between your skin and the bag. ? Leave the ice on for 20 minutes, 2-3 times a day.  Rest the injured area. Avoid strenuous activity and any activities or movements that cause pain. Be careful during activities and avoid bumping the injured area.  Do not lift anything that is heavier than 5 lb (2.3  kg), or the limit that you are told, until your health care provider says that it is safe. General instructions  Do not use any products that contain nicotine or tobacco, such as cigarettes and e-cigarettes. These can delay healing. If you need help quitting, ask your health care provider.  Do deep-breathing exercises as told by your health care provider.  If you were given an incentive spirometer, use it every 1-2 hours while you are awake, or as recommended by your health care provider. This device measures how well you are filling your lungs with each breath.  Keep all follow-up visits as told by your health care provider. This is important. Contact a health care provider if you have:  Increased bruising or swelling.  Pain that is not controlled with treatment.  A fever. Get help right away if you:  Have difficulty breathing or shortness of breath.  Develop a continual cough or you cough up thick or bloody sputum.  Feel nauseous or you vomit.  Have pain in your abdomen. Summary  A rib contusion is a deep bruise on your rib area. Contusions are the result of a blunt trauma that causes bleeding and injury to the tissues under the skin.  The skin overlying the contusion may turn blue, purple, or yellow. Minor injuries may give you a painless contusion. More severe contusions may stay painful and swollen for a few weeks.  Rest the injured area. Avoid strenuous activity and any activities or movements that cause pain. This information is not intended to replace advice given to you by your health care provider. Make sure you discuss any questions you have with your health care provider. Document Revised: 10/12/2017 Document Reviewed: 10/12/2017 Elsevier Patient Education  2020 [employer].

## 2020-01-08 ENCOUNTER — Encounter: Payer: Self-pay | Admitting: *Deleted

## 2020-01-08 ENCOUNTER — Encounter: Payer: Medicare Other | Admitting: Family Medicine

## 2020-01-08 DIAGNOSIS — Z96651 Presence of right artificial knee joint: Secondary | ICD-10-CM | POA: Diagnosis not present

## 2020-01-08 DIAGNOSIS — M25561 Pain in right knee: Secondary | ICD-10-CM | POA: Diagnosis not present

## 2020-01-08 DIAGNOSIS — Z96652 Presence of left artificial knee joint: Secondary | ICD-10-CM | POA: Diagnosis not present

## 2020-01-12 ENCOUNTER — Other Ambulatory Visit: Payer: Self-pay | Admitting: Cardiology

## 2020-01-12 DIAGNOSIS — I712 Thoracic aortic aneurysm, without rupture: Secondary | ICD-10-CM

## 2020-01-12 DIAGNOSIS — I7121 Aneurysm of the ascending aorta, without rupture: Secondary | ICD-10-CM

## 2020-01-12 DIAGNOSIS — I10 Essential (primary) hypertension: Secondary | ICD-10-CM

## 2020-01-15 DIAGNOSIS — N189 Chronic kidney disease, unspecified: Secondary | ICD-10-CM | POA: Diagnosis not present

## 2020-01-15 DIAGNOSIS — E875 Hyperkalemia: Secondary | ICD-10-CM | POA: Diagnosis not present

## 2020-01-15 DIAGNOSIS — I129 Hypertensive chronic kidney disease with stage 1 through stage 4 chronic kidney disease, or unspecified chronic kidney disease: Secondary | ICD-10-CM | POA: Diagnosis not present

## 2020-01-15 DIAGNOSIS — D631 Anemia in chronic kidney disease: Secondary | ICD-10-CM | POA: Diagnosis not present

## 2020-01-15 DIAGNOSIS — E559 Vitamin D deficiency, unspecified: Secondary | ICD-10-CM | POA: Diagnosis not present

## 2020-01-15 DIAGNOSIS — N1832 Chronic kidney disease, stage 3b: Secondary | ICD-10-CM | POA: Diagnosis not present

## 2020-01-16 DIAGNOSIS — Z96652 Presence of left artificial knee joint: Secondary | ICD-10-CM | POA: Diagnosis not present

## 2020-01-16 DIAGNOSIS — Z96651 Presence of right artificial knee joint: Secondary | ICD-10-CM | POA: Diagnosis not present

## 2020-01-16 DIAGNOSIS — M6281 Muscle weakness (generalized): Secondary | ICD-10-CM | POA: Diagnosis not present

## 2020-01-17 ENCOUNTER — Other Ambulatory Visit: Payer: Self-pay | Admitting: Nephrology

## 2020-01-17 DIAGNOSIS — N1832 Chronic kidney disease, stage 3b: Secondary | ICD-10-CM

## 2020-01-18 DIAGNOSIS — Z96651 Presence of right artificial knee joint: Secondary | ICD-10-CM | POA: Diagnosis not present

## 2020-01-18 DIAGNOSIS — Z96652 Presence of left artificial knee joint: Secondary | ICD-10-CM | POA: Diagnosis not present

## 2020-01-18 DIAGNOSIS — M6281 Muscle weakness (generalized): Secondary | ICD-10-CM | POA: Diagnosis not present

## 2020-01-23 DIAGNOSIS — M6281 Muscle weakness (generalized): Secondary | ICD-10-CM | POA: Diagnosis not present

## 2020-01-23 DIAGNOSIS — Z96652 Presence of left artificial knee joint: Secondary | ICD-10-CM | POA: Diagnosis not present

## 2020-01-23 DIAGNOSIS — Z96651 Presence of right artificial knee joint: Secondary | ICD-10-CM | POA: Diagnosis not present

## 2020-01-25 DIAGNOSIS — Z96652 Presence of left artificial knee joint: Secondary | ICD-10-CM | POA: Diagnosis not present

## 2020-01-25 DIAGNOSIS — Z96651 Presence of right artificial knee joint: Secondary | ICD-10-CM | POA: Diagnosis not present

## 2020-01-25 DIAGNOSIS — M6281 Muscle weakness (generalized): Secondary | ICD-10-CM | POA: Diagnosis not present

## 2020-01-28 ENCOUNTER — Ambulatory Visit (INDEPENDENT_AMBULATORY_CARE_PROVIDER_SITE_OTHER): Payer: Medicare Other | Admitting: Family Medicine

## 2020-01-28 ENCOUNTER — Encounter: Payer: Self-pay | Admitting: Family Medicine

## 2020-01-28 ENCOUNTER — Other Ambulatory Visit: Payer: Self-pay

## 2020-01-28 VITALS — BP 119/68 | HR 63 | Temp 97.8°F | Resp 15 | Ht 71.0 in | Wt 215.4 lb

## 2020-01-28 DIAGNOSIS — S20211D Contusion of right front wall of thorax, subsequent encounter: Secondary | ICD-10-CM | POA: Diagnosis not present

## 2020-01-28 DIAGNOSIS — Z96652 Presence of left artificial knee joint: Secondary | ICD-10-CM | POA: Diagnosis not present

## 2020-01-28 DIAGNOSIS — R238 Other skin changes: Secondary | ICD-10-CM

## 2020-01-28 DIAGNOSIS — M6281 Muscle weakness (generalized): Secondary | ICD-10-CM | POA: Diagnosis not present

## 2020-01-28 DIAGNOSIS — R233 Spontaneous ecchymoses: Secondary | ICD-10-CM

## 2020-01-28 DIAGNOSIS — Z96651 Presence of right artificial knee joint: Secondary | ICD-10-CM | POA: Diagnosis not present

## 2020-01-28 LAB — CBC
Hematocrit: 40.9 % (ref 37.5–51.0)
Hemoglobin: 13.2 g/dL (ref 13.0–17.7)
MCH: 29.8 pg (ref 26.6–33.0)
MCHC: 32.3 g/dL (ref 31.5–35.7)
MCV: 92 fL (ref 79–97)
Platelets: 182 10*3/uL (ref 150–450)
RBC: 4.43 x10E6/uL (ref 4.14–5.80)
RDW: 13 % (ref 11.6–15.4)
WBC: 5.8 10*3/uL (ref 3.4–10.8)

## 2020-01-28 NOTE — Patient Instructions (Addendum)
  I will check blood counts, but expect the bruise to continue to heal. Return to the clinic or go to the nearest emergency room if any of your symptoms worsen or new symptoms occur.   If you have lab work done today you will be contacted with your lab results within the next 2 weeks.  If you have not heard from [name] then please contact us. The fastest way to get your results is to register for My Chart.   IF you received an x-ray today, you will receive an invoice from [facility]. Please contact [facility] at [phone] with questions or concerns regarding your invoice.   IF you received labwork today, you will receive an invoice from [place]. Please contact [facility] at [phone] with questions or concerns regarding your invoice.   Our billing staff will not be able to assist you with questions regarding bills from these companies.  You will be contacted with the lab results as soon as they are available. The fastest way to get your results is to activate your My Chart account. Instructions are located on the last page of this paperwork. If you have not heard from [name] regarding the results in 2 weeks, please contact this office.

## 2020-01-28 NOTE — Progress Notes (Signed)
Subjective:  Patient ID: Joseph Maldonado., male    DOB: [birth year]  Age: 74 y.o. MRN: [id number]  CC:  Chief Complaint  Patient presents with  . Chest Pain    pt denies tenderness in the area of the bruise, feels much better, pt does not get pain when taking a deep breathe, pt does still have a bruise on his lower back from fall concerned about his coagulation     HPI [name]. presents for   Follow-up chest wall pain.  Evaluated April 6 with Dr. Nolon Rod.  Had a fall on April 2.  Knee had buckled, fell against a sharp object.  Was seen at [facility] urgent care, negative XR.   Diagnosed with rib contusion.  Option of higher dosing of tramadol 100 mg, lidocaine patches or over-the-counter lidocaine patch.  NSAIDs avoided with elevating creatinine.  Creatinine was stable at 1.7 from November 30 April 6.Dr. Carolin Sicks - told levels improved - 1.5?   Rib pain has improved - recovered in 1 week for the most part. Did not need lidocaine patches.   Has noticed a bruise on low back after the fall on 4/2.  initially very large, fading now, but concerned that over-anticoagulated. No hemoptysis, hematuria, or blood in gums. On aspirin only.     History Patient Active Problem List   Diagnosis Date Noted  . OSA (obstructive sleep apnea) 06/01/2019  . Aneurysm of ascending aorta (HCC) 05/18/2019  . Coronary artery disease involving native coronary artery of native heart without angina pectoris 05/18/2019  . Essential hypertension 05/18/2019  . History of total hip arthroplasty, right 08/14/2018  . Osteoarthritis of right hip 08/12/2018  . Palpitations 01/29/2018  . SCC (squamous cell carcinoma) 01/28/2018  . Solitary pulmonary nodule 10/03/2016  . Cough 09/29/2016  . Seborrheic keratosis 02/26/2014  . Rotator cuff tear arthropathy of right shoulder 01/10/2014  . 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 S/P total knee arthroplasty 10/05/2011  . Hip joint replacement  by other means 03/01/2011  . OA (osteoarthritis) of knee 03/01/2011  . CKD (chronic kidney disease) 05/13/2010   Past Medical History:  Diagnosis Date  . Arthritis   . CAD (coronary artery disease)   . Chronic kidney disease   . Complication of anesthesia    reports " i lose my blood pressure after anesthesia; the drops usually happens in recovery"   . Depression   . Hand pain    currently being evaluated by rheumatology for dx   . Hematuria   . Hypertension   . Rapid heart beat    rpeorts  " 2 months ago i had a rapid heart beat that lasted 45 min to an hour, ive experience this twice in the last t10 years but only seconds long" " i was evaluated by cardiology Dr Landry Mellow who did a 30 day monitor and ECHO and said i have occasional [PATS] ? , denies chest pain nor LOC  during these episodes; HR today is WDL   . Sleep apnea    CPAP    Past Surgical History:  Procedure Laterality Date  . CATARACT EXTRACTION, BILATERAL    . HIP SURGERY Left 2012  . JOINT REPLACEMENT    . REPLACEMENT TOTAL KNEE Right    2001,2006,2009,2013  . REPLACEMENT TOTAL KNEE Left 2011  . TOTAL HIP ARTHROPLASTY Right 08/14/2018   Procedure: TOTAL HIP ARTHROPLASTY ANTERIOR APPROACH;  Surgeon: Frederik Pear, MD;  Location: [facility];  Service: Orthopedics;  Laterality: Right;   Allergies  Allergen Reactions  . Nsaids     Affects creatinine  . Dilaudid [Hydromorphone Hcl] Other (See Comments)    Suicidal   Prior to Admission medications   Medication Sig Start Date End Date Taking? Authorizing Provider  aspirin EC 81 MG tablet Take 81 mg by mouth daily.    [provider]  atenolol (TENORMIN) 25 MG tablet TAKE 1 TABLET(25 MG) BY MOUTH DAILY 01/14/20   Patwardhan, Manish J, MD  atorvastatin (LIPITOR) 20 MG tablet TAKE 1 TABLET(20 MG) BY MOUTH DAILY 08/27/19   Patwardhan, Manish J, MD  benzonatate (TESSALON) 200 MG capsule Take 1 capsule (200 mg total) by mouth 3 (three) times daily as needed for cough.  09/24/19   Tanda Rockers, MD  buPROPion (WELLBUTRIN SR) 200 MG 12 hr tablet TAKE 2 TABLETS(400 MG) BY MOUTH DAILY 11/30/19   Wendie Agreste, MD  fluticasone [facility]) 50 MCG/ACT nasal spray Place 1 spray into both nostrils daily. 06/01/19   Lauraine Rinne, NP  lidocaine (LIDODERM) 5 % Place 1 patch onto the skin daily. Remove & Discard patch within 12 hours or as directed by MD 01/01/20   Forrest Moron, MD  losartan (COZAAR) 100 MG tablet Take 1 tablet (100 mg total) by mouth daily. 05/18/19   Patwardhan, Reynold Bowen, MD  pantoprazole (PROTONIX) 20 MG tablet Take 1 tablet (20 mg total) by mouth daily. 10/19/19   Tanda Rockers, MD   Social History   Socioeconomic History  . Marital status: Married    Spouse name: Not on file  . Number of children: 2  . Years of education: Not on file  . Highest education level: Bachelor's degree (e.g., BA, AB, BS)  Occupational History  . Occupation: Retired  Tobacco Use  . Smoking status: Former Smoker    Packs/day: 0.50    Years: 3.00    Pack years: 1.50    Types: Cigarettes    Quit date: 1971    Years since quitting: 50.3  . Smokeless tobacco: Never Used  Substance and Sexual Activity  . Alcohol use: Yes    Alcohol/week: 3.0 standard drinks    Types: 3 Cans of beer per week    Comment: max 3 beers/week  . Drug use: No    Comment: former user of marijuana, quit in 1971  . Sexual activity: Yes  Other Topics Concern  . Not on file  Social History Narrative   Lives with his wife.   Education: College   Exercise: No   Drinks about 2 large coffees per day   Right handed      Spent many years in [place].  Moved to [place] to be near their children.  Daughter lives in [place].  Their son has lived in the [place] area with plans to move to [place] in summer 2019.   Social Determinants of Health   Financial Resource Strain:   . Difficulty of Paying Living Expenses:   Food Insecurity:   . Worried About Charity fundraiser in the Last Year:     . Arboriculturist in the Last Year:   Transportation Needs:   . Film/video editor (Medical):   Marland Kitchen Lack of Transportation (Non-Medical):   Physical Activity:   . Days of Exercise per Week:   . Minutes of Exercise per Session:   Stress:   . Feeling of Stress :   Social Connections:   . Frequency of Communication  with Friends and Family:   . Frequency of Social Gatherings with Friends and Family:   . Attends Religious Services:   . Active Member of Clubs or Organizations:   . Attends Archivist Meetings:   Marland Kitchen Marital Status:   Intimate Partner Violence:   . Fear of Current or Ex-Partner:   . Emotionally Abused:   Marland Kitchen Physically Abused:   . Sexually Abused:     Review of Systems  Per HPI Objective:   Vitals:   01/28/20 0925  BP: 119/68  Pulse: 63  Resp: 15  Temp: 97.8 F (36.6 C)  TempSrc: Temporal  SpO2: 97%  Weight: 215 lb 6.4 oz (97.7 kg)  Height: 5\' 11"  (1.803 m)     Physical Exam Vitals reviewed.  Constitutional:      Appearance: He is well-developed.  HENT:     Head: Normocephalic and atraumatic.  Eyes:     Pupils: Pupils are equal, round, and reactive to light.  Neck:     Vascular: No carotid bruit or JVD.  Cardiovascular:     Rate and Rhythm: Normal rate and regular rhythm.     Heart sounds: Normal heart sounds. No murmur.  Pulmonary:     Effort: Pulmonary effort is normal.     Breath sounds: Normal breath sounds. No rales.  Skin:    General: Skin is warm and dry.       Neurological:     Mental Status: He is alert and oriented to person, place, and time.        Assessment & Plan:  Daryan Areola. is a 74 y.o. male . Rib contusion, right, subsequent encounter  - improved. rtc precautions.   Abnormal bruising - Plan: CBC  - persistent but fading bruise on R flank. Nontender. May have been from rib contusion, but with timing since injury - check CBC. Currently on ASA only.   rtc precautions.   No orders of the defined  types were placed in this encounter.  Patient Instructions    I will check blood counts, but expect the bruise to continue to heal. Return to the clinic or go to the nearest emergency room if any of your symptoms worsen or new symptoms occur.   If you have lab work done today you will be contacted with your lab results within the next 2 weeks.  If you have not heard from [name] then please contact us. The fastest way to get your results is to register for My Chart.   IF you received an x-ray today, you will receive an invoice from [facility]. Please contact [facility] at [phone] with questions or concerns regarding your invoice.   IF you received labwork today, you will receive an invoice from [place]. Please contact [facility] at [phone] with questions or concerns regarding your invoice.   Our billing staff will not be able to assist you with questions regarding bills from these companies.  You will be contacted with the lab results as soon as they are available. The fastest way to get your results is to activate your My Chart account. Instructions are located on the last page of this paperwork. If you have not heard from [name] regarding the results in 2 weeks, please contact this office.         Signed, [name], MD [facility]

## 2020-01-31 DIAGNOSIS — Z96652 Presence of left artificial knee joint: Secondary | ICD-10-CM | POA: Diagnosis not present

## 2020-01-31 DIAGNOSIS — M6281 Muscle weakness (generalized): Secondary | ICD-10-CM | POA: Diagnosis not present

## 2020-01-31 DIAGNOSIS — Z96651 Presence of right artificial knee joint: Secondary | ICD-10-CM | POA: Diagnosis not present

## 2020-02-01 ENCOUNTER — Ambulatory Visit
Admission: RE | Admit: 2020-02-01 | Discharge: 2020-02-01 | Disposition: A | Discharge status: Home / Self Care | Payer: Medicare Other | Source: Ambulatory Visit | Attending: Nephrology | Admitting: Nephrology

## 2020-02-01 DIAGNOSIS — N1832 Chronic kidney disease, stage 3b: Secondary | ICD-10-CM

## 2020-02-01 DIAGNOSIS — N189 Chronic kidney disease, unspecified: Secondary | ICD-10-CM | POA: Diagnosis not present

## 2020-02-01 DIAGNOSIS — N281 Cyst of kidney, acquired: Secondary | ICD-10-CM | POA: Diagnosis not present

## 2020-02-05 DIAGNOSIS — Z96651 Presence of right artificial knee joint: Secondary | ICD-10-CM | POA: Diagnosis not present

## 2020-02-05 DIAGNOSIS — Z96652 Presence of left artificial knee joint: Secondary | ICD-10-CM | POA: Diagnosis not present

## 2020-02-05 DIAGNOSIS — M6281 Muscle weakness (generalized): Secondary | ICD-10-CM | POA: Diagnosis not present

## 2020-02-07 DIAGNOSIS — Z96652 Presence of left artificial knee joint: Secondary | ICD-10-CM | POA: Diagnosis not present

## 2020-02-07 DIAGNOSIS — Z96651 Presence of right artificial knee joint: Secondary | ICD-10-CM | POA: Diagnosis not present

## 2020-02-07 DIAGNOSIS — M6281 Muscle weakness (generalized): Secondary | ICD-10-CM | POA: Diagnosis not present

## 2020-02-11 DIAGNOSIS — Z96651 Presence of right artificial knee joint: Secondary | ICD-10-CM | POA: Diagnosis not present

## 2020-02-11 DIAGNOSIS — M6281 Muscle weakness (generalized): Secondary | ICD-10-CM | POA: Diagnosis not present

## 2020-02-11 DIAGNOSIS — Z96652 Presence of left artificial knee joint: Secondary | ICD-10-CM | POA: Diagnosis not present

## 2020-02-18 DIAGNOSIS — Z96652 Presence of left artificial knee joint: Secondary | ICD-10-CM | POA: Diagnosis not present

## 2020-02-18 DIAGNOSIS — Z96651 Presence of right artificial knee joint: Secondary | ICD-10-CM | POA: Diagnosis not present

## 2020-02-18 DIAGNOSIS — M6281 Muscle weakness (generalized): Secondary | ICD-10-CM | POA: Diagnosis not present

## 2020-02-19 ENCOUNTER — Other Ambulatory Visit: Payer: Self-pay | Admitting: Cardiology

## 2020-02-20 DIAGNOSIS — Z96651 Presence of right artificial knee joint: Secondary | ICD-10-CM | POA: Diagnosis not present

## 2020-02-20 DIAGNOSIS — Z96652 Presence of left artificial knee joint: Secondary | ICD-10-CM | POA: Diagnosis not present

## 2020-02-20 DIAGNOSIS — M6281 Muscle weakness (generalized): Secondary | ICD-10-CM | POA: Diagnosis not present

## 2020-02-26 DIAGNOSIS — M6281 Muscle weakness (generalized): Secondary | ICD-10-CM | POA: Diagnosis not present

## 2020-02-26 DIAGNOSIS — Z96651 Presence of right artificial knee joint: Secondary | ICD-10-CM | POA: Diagnosis not present

## 2020-02-26 DIAGNOSIS — Z96652 Presence of left artificial knee joint: Secondary | ICD-10-CM | POA: Diagnosis not present

## 2020-02-27 ENCOUNTER — Other Ambulatory Visit: Payer: Self-pay

## 2020-02-27 ENCOUNTER — Encounter: Payer: Self-pay | Admitting: Family Medicine

## 2020-02-27 ENCOUNTER — Ambulatory Visit (INDEPENDENT_AMBULATORY_CARE_PROVIDER_SITE_OTHER): Payer: Medicare Other | Admitting: Family Medicine

## 2020-02-27 VITALS — BP 117/73 | HR 110 | Temp 98.5°F | Ht 71.0 in | Wt 214.8 lb

## 2020-02-27 DIAGNOSIS — E785 Hyperlipidemia, unspecified: Secondary | ICD-10-CM | POA: Diagnosis not present

## 2020-02-27 DIAGNOSIS — G4700 Insomnia, unspecified: Secondary | ICD-10-CM | POA: Diagnosis not present

## 2020-02-27 DIAGNOSIS — F329 Major depressive disorder, single episode, unspecified: Secondary | ICD-10-CM | POA: Diagnosis not present

## 2020-02-27 DIAGNOSIS — I712 Thoracic aortic aneurysm, without rupture: Secondary | ICD-10-CM

## 2020-02-27 DIAGNOSIS — I10 Essential (primary) hypertension: Secondary | ICD-10-CM

## 2020-02-27 DIAGNOSIS — N189 Chronic kidney disease, unspecified: Secondary | ICD-10-CM

## 2020-02-27 DIAGNOSIS — I7121 Aneurysm of the ascending aorta, without rupture: Secondary | ICD-10-CM

## 2020-02-27 DIAGNOSIS — H6122 Impacted cerumen, left ear: Secondary | ICD-10-CM | POA: Diagnosis not present

## 2020-02-27 DIAGNOSIS — F32A Depression, unspecified: Secondary | ICD-10-CM

## 2020-02-27 MED ORDER — ATORVASTATIN CALCIUM 20 MG PO TABS: ORAL_TABLET | ORAL | 1 refills | Status: DC

## 2020-02-27 MED ORDER — BUPROPION HCL ER (SR) 200 MG PO TB12: ORAL_TABLET | ORAL | 1 refills | Status: DC

## 2020-02-27 MED ORDER — PANTOPRAZOLE SODIUM 20 MG PO TBEC: 20.0000 mg | DELAYED_RELEASE_TABLET | Freq: Every day | ORAL | 5 refills | Status: DC

## 2020-02-27 MED ORDER — ATENOLOL 25 MG PO TABS: ORAL_TABLET | ORAL | 1 refills | Status: DC

## 2020-02-27 MED ORDER — LOSARTAN POTASSIUM 100 MG PO TABS: 100.0000 mg | ORAL_TABLET | Freq: Every day | ORAL | 3 refills | Status: DC

## 2020-02-27 NOTE — Patient Instructions (Addendum)
   Call pulmonary to decide on follow up appointment and repeat scan interval of your pulmonary nodule.   No med changes today.   New hearing aids should help. Word puzzles, sudoku or other brain games can be helpful. If any memory changes - please return to discuss further and for other testing.   If you have lab work done today you will be contacted with your lab results within the next 2 weeks.  If you have not heard from [name] then please contact us. The fastest way to get your results is to register for My Chart.   IF you received an x-ray today, you will receive an invoice from [facility]. Please contact [facility] at [phone] with questions or concerns regarding your invoice.   IF you received labwork today, you will receive an invoice from [place]. Please contact [facility] at [phone] with questions or concerns regarding your invoice.   Our billing staff will not be able to assist you with questions regarding bills from these companies.  You will be contacted with the lab results as soon as they are available. The fastest way to get your results is to activate your My Chart account. Instructions are located on the last page of this paperwork. If you have not heard from [name] regarding the results in 2 weeks, please contact this office.

## 2020-02-27 NOTE — Progress Notes (Signed)
Subjective:  Patient ID: Joseph Parrot., male    DOB: 1946/02/18  Age: 74 y.o. MRN: [id number]  CC:  Chief Complaint  Patient presents with  . Chronic Kidney Disease    pt reports no issues going to the bathroom or flank pain.  Marland Kitchen Hypertension    pt reports his BP is vary well controled no issues with medication. pt reports no physical symptoms of hypertension.    HPI Joseph Maldonado. presents for   Hypertension:  Cardiology - Dr. Virgina Jock.  History of OSA status post uvulectomy, now on CPAP Ascending aortic aneurysm.  Aortic root 4.2 cm on echocardiogram 05/08/2019, 4.3 cm but stable on CT chest April 10, 2019.  Plan for repeat in August 2022.  Atenolol 25 mg daily was added at August visit with cardiology.  Continued losartan 100 mg daily.  History of nonobstructive CAD, continued on aspirin and statin. Home readings:none.  No new side effects meds. BP ok at dentist few weeks ago.  Exercise with work around the house.  BP Readings from Last 3 Encounters:  02/27/20 117/73  01/28/20 119/68  01/01/20 123/71   Lab Results  Component Value Date   CREATININE 1.70 (H) 01/01/2020   Depression: Wellbutrin '400mg'$  QAM. Working well for mood, no side effects. More energy during the day. Sleeping ok.  Not feeling as sharp as he was in past. Occasionally forgets name, some trouble with with initially finding word, remembers later.  No difficulty with IADL/ADL's  No known FH of dementia. Denies new memory issues.  New hearing aids planned.   6CIT Screen 08/27/2019 01/04/2019 08/08/2017  What Year? 0 points 0 points 0 points  What month? 0 points 0 points 0 points  What time? 0 points 0 points 0 points  Count back from 20 0 points 0 points 0 points  Months in reverse 0 points 0 points 0 points  Repeat phrase 0 points 0 points 0 points  Total Score 0 0 0    Depression screen [facility] 2/9 02/27/2020 01/28/2020 01/01/2020 08/27/2019 06/11/2019  Decreased Interest 0 0 0 0 0  Down, Depressed,  Hopeless 0 0 0 0 0  PHQ - 2 Score 0 0 0 0 0    Pulmonary nodule Followed by pulmonary, Dr. Melvyn Novas, possible repeat scan in July of this year. On PPI QD - cough is managed.   Hyperlipidemia: lipitor '20mg'$  qd. No new myalgias.  Not fasting today.   Lab Results  Component Value Date   CHOL 117 06/11/2019   HDL 54 06/11/2019   LDLCALC 45 06/11/2019   TRIG 96 06/11/2019   CHOLHDL 2.2 06/11/2019   Lab Results  Component Value Date   ALT 15 06/11/2019   AST 15 06/11/2019   ALKPHOS 93 06/11/2019   BILITOT 0.3 06/11/2019   Chronic kidney disease Thought to be a component of hypertensive nephropathy with NSAID use as well.  Range of 1.20-1.46 since 2018, 1.55 in September, then 1.7 in November of last year as well as April of this year.  EGFR 39.  Nephrology, Dr. Carolin Sicks, renal ultrasound 02/01/20 - bilateral benign appearing renal cysts, chronic medical renal disease.  Last visit a month ago. Creatinine improved.  Nephritis as a child.   History Patient Active Problem List   Diagnosis Date Noted  . OSA (obstructive sleep apnea) 06/01/2019  . Aneurysm of ascending aorta (HCC) 05/18/2019  . Coronary artery disease involving native coronary artery of native heart without angina pectoris 05/18/2019  . Essential hypertension  05/18/2019  . History of total hip arthroplasty, right 08/14/2018  . Osteoarthritis of right hip 08/12/2018  . Palpitations 01/29/2018  . SCC (squamous cell carcinoma) 01/28/2018  . Solitary pulmonary nodule 10/03/2016  . Cough 09/29/2016  . Seborrheic keratosis 02/26/2014  . Rotator cuff tear arthropathy of right shoulder 01/10/2014  . 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 S/P total knee arthroplasty 10/05/2011  . 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 OA (osteoarthritis) of knee 03/01/2011  . CKD (chronic kidney disease) 05/13/2010   Past Medical History:  Diagnosis Date  . Arthritis   . CAD (coronary artery disease)   . Chronic  kidney disease   . Complication of anesthesia    reports " i lose my blood pressure after anesthesia; the drops usually happens in recovery"   . Depression   . Hand pain    currently being evaluated by rheumatology for dx   . Hematuria   . Hypertension   . Rapid heart beat    rpeorts  " 2 months ago i had a rapid heart beat that lasted 45 min to an hour, ive experience this twice in the last t10 years but only seconds long" " i was evaluated by cardiology Dr Landry Mellow who did a 30 day monitor and ECHO and said i have occasional [PATS] ? , denies chest pain nor LOC  during these episodes; HR today is WDL   . Sleep apnea    CPAP    Past Surgical History:  Procedure Laterality Date  . CATARACT EXTRACTION, BILATERAL    . HIP SURGERY Left 2012  . JOINT REPLACEMENT    . REPLACEMENT TOTAL KNEE Right    2001,2006,2009,2013  . REPLACEMENT TOTAL KNEE Left 2011  . TOTAL HIP ARTHROPLASTY Right 08/14/2018   Procedure: TOTAL HIP ARTHROPLASTY ANTERIOR APPROACH;  Surgeon: Frederik Pear, MD;  Location: [facility];  Service: Orthopedics;  Laterality: Right;   Allergies  Allergen Reactions  . Nsaids     Affects creatinine  . Dilaudid [Hydromorphone Hcl] Other (See Comments)    Suicidal   Prior to Admission medications   Medication Sig Start Date End Date Taking? Authorizing Provider  aspirin EC 81 MG tablet Take 81 mg by mouth daily.    [provider]  atenolol (TENORMIN) 25 MG tablet TAKE 1 TABLET(25 MG) BY MOUTH DAILY 01/14/20   Patwardhan, Manish J, MD  atorvastatin (LIPITOR) 20 MG tablet TAKE 1 TABLET(20 MG) BY MOUTH DAILY 02/19/20   Patwardhan, Manish J, MD  buPROPion (WELLBUTRIN SR) 200 MG 12 hr tablet TAKE 2 TABLETS(400 MG) BY MOUTH DAILY 11/30/19   Wendie Agreste, MD  fluticasone (FLONASE) 50 MCG/ACT nasal spray Place 1 spray into both nostrils daily. 06/01/19   Lauraine Rinne, NP  lidocaine (LIDODERM) 5 % Place 1 patch onto the skin daily. Remove & Discard patch within 12 hours or as  directed by MD 01/01/20   Forrest Moron, MD  losartan (COZAAR) 100 MG tablet Take 1 tablet (100 mg total) by mouth daily. 05/18/19   Patwardhan, Reynold Bowen, MD  pantoprazole (PROTONIX) 20 MG tablet Take 1 tablet (20 mg total) by mouth daily. 10/19/19   Tanda Rockers, MD   Social History   Socioeconomic History  . Marital status: Married    Spouse name: Not on file  . Number of children: 2  . Years of education: Not on file  . Highest education level: Bachelor's degree (e.g., BA, AB, BS)  Occupational History  . Occupation: Retired  Tobacco Use  . Smoking status: Former Smoker    Packs/day: 0.50    Years: 3.00    Pack years: 1.50    Types: Cigarettes    Quit date: 1971    Years since quitting: 50.4  . Smokeless tobacco: Never Used  Substance and Sexual Activity  . Alcohol use: Yes    Alcohol/week: 3.0 standard drinks    Types: 3 Cans of beer per week    Comment: max 3 beers/week  . Drug use: No    Comment: former user of marijuana, quit in 1971  . Sexual activity: Yes  Other Topics Concern  . Not on file  Social History Narrative   Lives with his wife.   Education: College   Exercise: No   Drinks about 2 large coffees per day   Right handed      Spent many years in [place].  Moved to [place] to be near their children.  Daughter lives in [place].  Their son has lived in the [place] area with plans to move to [place] in summer 2019.   Social Determinants of Health   Financial Resource Strain:   . Difficulty of Paying Living Expenses:   Food Insecurity:   . Worried About Charity fundraiser in the Last Year:   . Arboriculturist in the Last Year:   Transportation Needs:   . Film/video editor (Medical):   Marland Kitchen Lack of Transportation (Non-Medical):   Physical Activity:   . Days of Exercise per Week:   . Minutes of Exercise per Session:   Stress:   . Feeling of Stress :   Social Connections:   . Frequency of Communication with Friends and Family:   . Frequency of  Social Gatherings with Friends and Family:   . Attends Religious Services:   . Active Member of Clubs or Organizations:   . Attends Archivist Meetings:   Marland Kitchen Marital Status:   Intimate Partner Violence:   . Fear of Current or Ex-Partner:   . Emotionally Abused:   Marland Kitchen Physically Abused:   . Sexually Abused:     Review of Systems  Constitutional: Negative for fatigue and unexpected weight change.  Eyes: Negative for visual disturbance.  Respiratory: Negative for cough, chest tightness and shortness of breath.   Cardiovascular: Negative for chest pain, palpitations and leg swelling.  Gastrointestinal: Negative for abdominal pain and blood in stool.  Neurological: Negative for dizziness, light-headedness and headaches.     Objective:   Vitals:   02/27/20 0847  BP: 117/73  Pulse: (!) 110  Temp: 98.5 F (36.9 C)  TempSrc: Temporal  SpO2: 98%  Weight: 214 lb 12.8 oz (97.4 kg)  Height: '5\' 11"'$  (1.803 m)     Physical Exam Vitals reviewed.  Constitutional:      Appearance: He is well-developed.  HENT:     Head: Normocephalic and atraumatic.  Eyes:     Pupils: Pupils are equal, round, and reactive to light.  Neck:     Vascular: No carotid bruit or JVD.  Cardiovascular:     Rate and Rhythm: Normal rate and regular rhythm.     Heart sounds: Normal heart sounds. No murmur.  Pulmonary:     Effort: Pulmonary effort is normal.     Breath sounds: Normal breath sounds. No rales.  Skin:    General: Skin is warm and dry.  Neurological:     Mental Status: He  is alert and oriented to person, place, and time.  Psychiatric:        Mood and Affect: Mood normal.        Behavior: Behavior normal.        Assessment & Plan:  Joseph Eastham. is a 74 y.o. male . Essential hypertension - Plan: atenolol (TENORMIN) 25 MG tablet, losartan (COZAAR) 100 MG tablet   Stable, tolerating current regimen. Medications refilled. Reports recent labs at nephrology.   Depression,  unspecified depression type Insomnia, unspecified type - Plan: buPROPion (WELLBUTRIN SR) 200 MG 12 hr tablet  - The current medical regimen is effective;  continue present plan and medications.  Chronic kidney disease, unspecified CKD stage  - ultrasound reviewed. Recent nephrology appt with reported improved creatinine.   Hyperlipidemia, unspecified hyperlipidemia type  -The current medical regimen is effective;  continue present plan and medications.  Aneurysm of ascending aorta (HCC) - Plan: atenolol (TENORMIN) 25 MG tablet, losartan (COZAAR) 100 MG tablet  -Ongoing follow-up with cardiology.  Continue antihypertensives at same doses.  Discussed questions regarding cognition.  Appears to be normal  aging.  activities discussed to maintain cognition.  Option of MMSE, MoCA screening.   Meds ordered this encounter  Medications  . atenolol (TENORMIN) 25 MG tablet    Sig: TAKE 1 TABLET(25 MG) BY MOUTH DAILY    Dispense:  90 tablet    Refill:  1  . atorvastatin (LIPITOR) 20 MG tablet    Sig: TAKE 1 TABLET(20 MG) BY MOUTH DAILY    Dispense:  90 tablet    Refill:  1  . buPROPion (WELLBUTRIN SR) 200 MG 12 hr tablet    Sig: TAKE 2 TABLETS(400 MG) BY MOUTH DAILY    Dispense:  180 tablet    Refill:  1  . pantoprazole (PROTONIX) 20 MG tablet    Sig: Take 1 tablet (20 mg total) by mouth daily.    Dispense:  30 tablet    Refill:  5  . losartan (COZAAR) 100 MG tablet    Sig: Take 1 tablet (100 mg total) by mouth daily.    Dispense:  90 tablet    Refill:  3   Patient Instructions     Call pulmonary to decide on follow up appointment and repeat scan interval of your pulmonary nodule.   No med changes today.   New hearing aids should help. Word puzzles, sudoku or other brain games can be helpful. If any memory changes - please return to discuss further and for other testing.   If you have lab work done today you will be contacted with your lab results within the next 2 weeks.  If you  have not heard from [name] then please contact us. The fastest way to get your results is to register for My Chart.   IF you received an x-ray today, you will receive an invoice from [facility]. Please contact [facility] at [phone] with questions or concerns regarding your invoice.   IF you received labwork today, you will receive an invoice from [facility]. Please contact [facility] at [phone] with questions or concerns regarding your invoice.   Our billing staff will not be able to assist you with questions regarding bills from these companies.  You will be contacted with the lab results as soon as they are available. The fastest way to get your results is to activate your My Chart account. Instructions are located on the last page of this paperwork. If you have not  heard from [name] regarding the results in 2 weeks, please contact this office.         Signed, [name], MD [facility]

## 2020-03-04 DIAGNOSIS — Z96651 Presence of right artificial knee joint: Secondary | ICD-10-CM | POA: Diagnosis not present

## 2020-03-04 DIAGNOSIS — Z96652 Presence of left artificial knee joint: Secondary | ICD-10-CM | POA: Diagnosis not present

## 2020-03-04 DIAGNOSIS — M6281 Muscle weakness (generalized): Secondary | ICD-10-CM | POA: Diagnosis not present

## 2020-03-13 DIAGNOSIS — L814 Other melanin hyperpigmentation: Secondary | ICD-10-CM | POA: Diagnosis not present

## 2020-03-13 DIAGNOSIS — C4442 Squamous cell carcinoma of skin of scalp and neck: Secondary | ICD-10-CM | POA: Diagnosis not present

## 2020-03-13 DIAGNOSIS — C44729 Squamous cell carcinoma of skin of left lower limb, including hip: Secondary | ICD-10-CM | POA: Diagnosis not present

## 2020-03-13 DIAGNOSIS — L821 Other seborrheic keratosis: Secondary | ICD-10-CM | POA: Diagnosis not present

## 2020-03-13 DIAGNOSIS — Z85828 Personal history of other malignant neoplasm of skin: Secondary | ICD-10-CM | POA: Diagnosis not present

## 2020-03-13 DIAGNOSIS — D1801 Hemangioma of skin and subcutaneous tissue: Secondary | ICD-10-CM | POA: Diagnosis not present

## 2020-03-13 DIAGNOSIS — L820 Inflamed seborrheic keratosis: Secondary | ICD-10-CM | POA: Diagnosis not present

## 2020-03-13 DIAGNOSIS — D225 Melanocytic nevi of trunk: Secondary | ICD-10-CM | POA: Diagnosis not present

## 2020-04-21 ENCOUNTER — Other Ambulatory Visit: Payer: Self-pay | Admitting: Internal Medicine

## 2020-05-13 DIAGNOSIS — Z85828 Personal history of other malignant neoplasm of skin: Secondary | ICD-10-CM | POA: Diagnosis not present

## 2020-05-13 DIAGNOSIS — L814 Other melanin hyperpigmentation: Secondary | ICD-10-CM | POA: Diagnosis not present

## 2020-05-13 DIAGNOSIS — L821 Other seborrheic keratosis: Secondary | ICD-10-CM | POA: Diagnosis not present

## 2020-05-13 DIAGNOSIS — L820 Inflamed seborrheic keratosis: Secondary | ICD-10-CM | POA: Diagnosis not present

## 2020-05-16 ENCOUNTER — Ambulatory Visit (INDEPENDENT_AMBULATORY_CARE_PROVIDER_SITE_OTHER): Payer: Medicare Other | Admitting: Family Medicine

## 2020-05-16 ENCOUNTER — Encounter: Payer: Self-pay | Admitting: Family Medicine

## 2020-05-16 ENCOUNTER — Other Ambulatory Visit: Payer: Self-pay

## 2020-05-16 VITALS — BP 142/71 | HR 59 | Temp 98.0°F | Ht 71.0 in | Wt 213.0 lb

## 2020-05-16 DIAGNOSIS — R221 Localized swelling, mass and lump, neck: Secondary | ICD-10-CM | POA: Diagnosis not present

## 2020-05-16 NOTE — Patient Instructions (Addendum)
   I do not palpate any masses of concern in the thyroid or soft tissues of the neck.  The right side of the hyoid bone seems to be more prominent.  I am going ahead and getting an ultrasound to make sure there are no masses they are pushing it forward or making it seem more prominent.  Someone should contact you next week with regard to when the ultrasound is scheduled for.  Return as needed  If you have lab work done today you will be contacted with your lab results within the next 2 weeks.  If you have not heard from [name] then please contact us. The fastest way to get your results is to register for My Chart.   IF you received an x-ray today, you will receive an invoice from [facility]. Please contact [facility] at [phone] with questions or concerns regarding your invoice.   IF you received labwork today, you will receive an invoice from [facility]. Please contact [facility] at [phone] with questions or concerns regarding your invoice.   Our billing staff will not be able to assist you with questions regarding bills from these companies.  You will be contacted with the lab results as soon as they are available. The fastest way to get your results is to activate your My Chart account. Instructions are located on the last page of this paperwork. If you have not heard from [name] regarding the results in 2 weeks, please contact this office.

## 2020-05-16 NOTE — Progress Notes (Signed)
Patient ID: Joseph Parrot., male    DOB: Jun 23, 1946  Age: 74 y.o. MRN: [id number]  Chief Complaint  Patient presents with  . R side neck swelling    noticed about 2 days ago / reports no symptoms     Subjective:   Patient's wife noted an enlargement in the right side of his neck a couple of days ago so he came in to get it checked.  He wishes she were here to show exactly what she was concerned about.  They have recently had two friends diagnosed with malignancies including lymphoma and thyroid tumor.  Obviously this is bringing them anxiety.  Current allergies, medications, problem list, past/family and social histories reviewed.  Objective:  BP (!) 142/71   Pulse (!) 59   Temp 98 F (36.7 C)   Ht 5\' 11"  (1.803 m)   Wt 213 lb (96.6 kg)   SpO2 98%   BMI 29.71 kg/m   Throat clear.  He has had a uvulectomy.  Neck supple.  He has a little prominence of the right hyoid bone but I cannot feel any other definite masses.  No carotid bruits.  Assessment & Plan:   Assessment: 1. Neck mass       Plan: We will get an ultrasound of his neck to make certain I am not missing anything.  Orders Placed This Encounter  Procedures  . US Soft Tissue Head/Neck (NON-THYROID)    Standing Status:   Future    Standing Expiration Date:   05/16/2021    Order Specific Question:   Reason for Exam (SYMPTOM  OR DIAGNOSIS REQUIRED)    Answer:   prominent hyoid bone on right, rule out mass    Order Specific Question:   Preferred imaging location?    Answer:   [id number]    Order Specific Question:   Call Results- Best Contact Number?    Answer:   [phone] (desk)  or [phone] (Dr. Linna Darner cell)    No orders of the defined types were placed in this encounter.        Patient Instructions     I do not palpate any masses of concern in the thyroid or soft tissues of the neck.  The right side of the hyoid bone seems to be more prominent.  I am going ahead and getting an  ultrasound to make sure there are no masses they are pushing it forward or making it seem more prominent.  Someone should contact you next week with regard to when the ultrasound is scheduled for.  Return as needed  If you have lab work done today you will be contacted with your lab results within the next 2 weeks.  If you have not heard from [name] then please contact us. The fastest way to get your results is to register for My Chart.   IF you received an x-ray today, you will receive an invoice from [facility]. Please contact [facility] at [phone] with questions or concerns regarding your invoice.   IF you received labwork today, you will receive an invoice from [facility]. Please contact [facility] at [phone] with questions or concerns regarding your invoice.   Our billing staff will not be able to assist you with questions regarding bills from these companies.  You will be contacted with the lab results as soon as they are available. The fastest way to get your results is to activate your My Chart account. Instructions are located on the last page  of this paperwork. If you have not heard from [name] regarding the results in 2 weeks, please contact this office.         Return if symptoms worsen or fail to improve.   Ruben Reason, MD 05/16/2020

## 2020-05-18 NOTE — Progress Notes (Signed)
Follow up visit  Subjective:   Joseph Maldonado., male    DOB: 07/24/1946, 74 y.o.   MRN: [id number]   Chief Complaint  Patient presents with  . Hypertension  . Thoracic Aortic Aneurysm  . Follow-up    1 year    HPI  74 y/o Caucasian male with controlled hypertension, h/o OSA s/p uvulectomy, ascending aorta aneurysm, palpitations, here for follow up.  He is doing well from cardiac standpoint. He denies chest pain, shortness of breath, palpitations, leg edema, orthopnea, PND, TIA/syncope. He has reduced Aspirin to every other day due to skin bruising.  He recently saw nephrologist due to elevated Cr. This was attributed to dehydration.   On a separate note, his wife noticed him to have swelling on the right side of his neck. He is going to undergo ultrasound for the same.    Current Outpatient Medications on File Prior to Visit  Medication Sig Dispense Refill  . aspirin EC 81 MG tablet Take 81 mg by mouth every other day.     Marland Kitchen atenolol (TENORMIN) 25 MG tablet TAKE 1 TABLET(25 MG) BY MOUTH DAILY 90 tablet 1  . atorvastatin (LIPITOR) 20 MG tablet TAKE 1 TABLET(20 MG) BY MOUTH DAILY 90 tablet 1  . buPROPion (WELLBUTRIN SR) 200 MG 12 hr tablet TAKE 2 TABLETS(400 MG) BY MOUTH DAILY 180 tablet 1  . losartan (COZAAR) 100 MG tablet Take 1 tablet (100 mg total) by mouth daily. 90 tablet 3  . pantoprazole (PROTONIX) 20 MG tablet Take 1 tablet (20 mg total) by mouth daily. 30 tablet 5   No current facility-administered medications on file prior to visit.    Cardiovascular studies:  EKG 05/19/2020: Sinus rhythm 58 bpm Incomplete RBBB  Echocardiogram 05/08/2019: Left ventricle cavity is normal in size. Normal left ventricular wall thickness. Normal LV systolic function with EF 67%. Normal global wall motion. Normal diastolic filling pattern. Calculated EF 67%. Left atrial cavity is mildly dilated. Trileaflet aortic valve. Moderate (Grade II) aortic regurgitation. Mild aortic valve  leaflet calcification. Aortic root dilated, measuring 4.2 cm at sinus of Valsalva. Compared to previous study on 04/19/2018, there is minimal increase in aortic root diameter.   CTA chest and coronary CTA 06/20/2018: Calcium score 888 LM: Normal LAD: 50% or less calcified plaque in prox and mid LAD      <50% calcific plaque distally      D1: <50% ostial calcific plaque      D2: Normal LCx: <50% calcific plaque prox and mid vessel      OM1: <50% calcific plaque      AV grove: Normal RCA: <50% calcific plaque prox, mid, and distal vessel.      PDA: 50% calcific plaque on PLB and PDA  Aortic root: dilated Sinus: 3.7 cm STJ: 3.3 cm Aortic root: 4.2 cm Descending thoracic aorta 2.7 cm  Impression: Calcium score 80th percentile Nonobstructive three vessel CAD Aortic root dilatation 4.2 cm with Bovine arch  Echocardiogram 04/19/2018: Left ventricle cavity is normal in size. Mild concentric hypertrophy of the left ventricle. Normal global wall motion. Calculated EF 69%. Normal diastolic filling pattern. Calcification seen on noncoronary cusp. Moderate (Grade II) aortic regurgitation. Mild tricuspid regurgitation.  No evidence of pulmonary hypertension.  The aortic root is dilated, measuring 4.0 cm at sinotubular junction.  Event monitor 02/17/2018 - 03/18/2018: Sinus rhythm.  No arrhythmias.  No symptoms reported.  Recent labs: 01/28/2020: Glucose 102, BUN/Cr 36/1.7. EGFR 39. Na/K 139/4.8.  H/H 13/40. MCV 92.  Platelets 182  06/11/2019: Chol 117, TG 96, HDL 54, LDL 45  06/16/2018: Glucose 95. BUN/Cr 28/1.22. eGFR 60. Na/K 141/4.7   Review of Systems  Cardiovascular: Negative for chest pain, dyspnea on exertion, leg swelling, palpitations and syncope.        Vitals:   05/19/20 0926  BP: 129/74  Pulse: 60  Resp: 16  SpO2: 98%     Body mass index is 30.4 kg/m. Filed Weights   05/19/20 0926  Weight: 218 lb (98.9 kg)     Objective:   Physical Exam Vitals and  nursing note reviewed.  Constitutional:      General: He is not in acute distress. Neck:     Vascular: No JVD.  Cardiovascular:     Rate and Rhythm: Normal rate and regular rhythm.     Heart sounds: Normal heart sounds. No murmur heard.   Pulmonary:     Effort: Pulmonary effort is normal.     Breath sounds: Normal breath sounds. No wheezing or rales.           Assessment & Recommendations:   74 y/o Caucasian male with controlled hypertension, h/o OSA s/p uvulectomy, ascending aorta aneurysm, palpitations, here for follow up.  Ascending aorta aneurysm, grade II AI: 4.2 cm on echocardiogram in 04/2019 Echocardiogram and CTA (03/2018 AND 05/2018) correlate with each other. Currently on losartan 100 mg, atenolol 25 mg daily. Repeat echocardiogram in 04/2021.  Nonobstructive CAD: Continue aspirin/statin.  Hypertension: Well controlled on atenolol 25 mg, losartan 100 mg.  CKD: Given his ongoing use of losartan, recommend repeating BMP.  F/u w/nephrology.  Pulmonary nodule: Reportedly, known to the patient. Follow up with Dr. Carlota Raspberry.  F/u in 1 year.   Nigel Mormon, MD [facility] [place] Pager: [phone] Office: [phone] If no answer Cell [phone]

## 2020-05-19 ENCOUNTER — Encounter: Payer: Self-pay | Admitting: Cardiology

## 2020-05-19 ENCOUNTER — Ambulatory Visit: Payer: Medicare Other | Admitting: Cardiology

## 2020-05-19 ENCOUNTER — Other Ambulatory Visit (HOSPITAL_COMMUNITY): Payer: Self-pay | Admitting: Cardiology

## 2020-05-19 ENCOUNTER — Other Ambulatory Visit: Payer: Self-pay | Admitting: Internal Medicine

## 2020-05-19 ENCOUNTER — Other Ambulatory Visit: Payer: Self-pay

## 2020-05-19 VITALS — BP 129/74 | HR 60 | Resp 16 | Ht 71.0 in | Wt 218.0 lb

## 2020-05-19 DIAGNOSIS — I2510 Atherosclerotic heart disease of native coronary artery without angina pectoris: Secondary | ICD-10-CM

## 2020-05-19 DIAGNOSIS — I712 Thoracic aortic aneurysm, without rupture: Secondary | ICD-10-CM | POA: Diagnosis not present

## 2020-05-19 DIAGNOSIS — I7121 Aneurysm of the ascending aorta, without rupture: Secondary | ICD-10-CM

## 2020-05-19 DIAGNOSIS — I10 Essential (primary) hypertension: Secondary | ICD-10-CM | POA: Diagnosis not present

## 2020-05-19 DIAGNOSIS — N1832 Chronic kidney disease, stage 3b: Secondary | ICD-10-CM

## 2020-05-20 ENCOUNTER — Telehealth: Payer: Self-pay

## 2020-05-20 ENCOUNTER — Ambulatory Visit
Admission: RE | Admit: 2020-05-20 | Discharge: 2020-05-20 | Disposition: A | Discharge status: Home / Self Care | Payer: Medicare Other | Source: Ambulatory Visit | Attending: Family Medicine | Admitting: Family Medicine

## 2020-05-20 DIAGNOSIS — R221 Localized swelling, mass and lump, neck: Secondary | ICD-10-CM | POA: Diagnosis not present

## 2020-05-20 LAB — BASIC METABOLIC PANEL
BUN/Creatinine Ratio: 26 — ABNORMAL HIGH (ref 10–24)
BUN: 33 mg/dL — ABNORMAL HIGH (ref 8–27)
CO2: 19 mmol/L — ABNORMAL LOW (ref 20–29)
Calcium: 9.4 mg/dL (ref 8.6–10.2)
Chloride: 106 mmol/L (ref 96–106)
Creatinine, Ser: 1.28 mg/dL — ABNORMAL HIGH (ref 0.76–1.27)
GFR calc Af Amer: 64 mL/min/{1.73_m2} (ref >59)
GFR calc non Af Amer: 56 mL/min/{1.73_m2} — ABNORMAL LOW (ref >59)
Glucose: 97 mg/dL (ref 65–99)
Potassium: 4.8 mmol/L (ref 3.5–5.2)
Sodium: 138 mmol/L (ref 134–144)

## 2020-05-20 NOTE — Telephone Encounter (Signed)
[name] results seen - normal.

## 2020-06-16 ENCOUNTER — Other Ambulatory Visit: Payer: Self-pay | Admitting: Internal Medicine

## 2020-06-21 ENCOUNTER — Other Ambulatory Visit: Payer: Self-pay

## 2020-06-21 ENCOUNTER — Ambulatory Visit (HOSPITAL_COMMUNITY)
Admission: EM | Admit: 2020-06-21 | Discharge: 2020-06-21 | Disposition: A | Discharge status: Home / Self Care | Payer: Medicare Other

## 2020-06-21 ENCOUNTER — Encounter (HOSPITAL_COMMUNITY): Payer: Self-pay

## 2020-06-21 DIAGNOSIS — M7989 Other specified soft tissue disorders: Secondary | ICD-10-CM

## 2020-06-21 DIAGNOSIS — M79604 Pain in right leg: Secondary | ICD-10-CM

## 2020-06-21 DIAGNOSIS — I83811 Varicose veins of right lower extremities with pain: Secondary | ICD-10-CM

## 2020-06-21 DIAGNOSIS — I8001 Phlebitis and thrombophlebitis of superficial vessels of right lower extremity: Secondary | ICD-10-CM | POA: Diagnosis not present

## 2020-06-21 NOTE — Discharge Instructions (Addendum)
The scheduling facility for ultrasounds is closed but I highly recommend you contact them Monday morning at [phone]. This will be to schedule an outpatient ultrasound of your right lower leg to rule out a blood clot in the leg. In the meantime, I want you to use compression stockings, warm compresses and rest for pain related to thrombophlebitis or pain from varicose veins.
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
## 2020-06-23 ENCOUNTER — Other Ambulatory Visit: Payer: Self-pay

## 2020-06-23 ENCOUNTER — Ambulatory Visit (INDEPENDENT_AMBULATORY_CARE_PROVIDER_SITE_OTHER): Payer: Medicare Other | Admitting: Registered Nurse

## 2020-06-23 ENCOUNTER — Encounter: Payer: Self-pay | Admitting: Registered Nurse

## 2020-06-23 ENCOUNTER — Ambulatory Visit (INDEPENDENT_AMBULATORY_CARE_PROVIDER_SITE_OTHER): Payer: Medicare Other

## 2020-06-23 VITALS — BP 142/73 | HR 63 | Temp 98.3°F | Resp 18 | Ht 71.0 in | Wt 216.2 lb

## 2020-06-23 DIAGNOSIS — M79604 Pain in right leg: Secondary | ICD-10-CM

## 2020-06-23 DIAGNOSIS — W19XXXA Unspecified fall, initial encounter: Secondary | ICD-10-CM

## 2020-06-23 DIAGNOSIS — M25561 Pain in right knee: Secondary | ICD-10-CM | POA: Diagnosis not present

## 2020-06-23 DIAGNOSIS — Z23 Encounter for immunization: Secondary | ICD-10-CM

## 2020-06-23 DIAGNOSIS — Z96651 Presence of right artificial knee joint: Secondary | ICD-10-CM

## 2020-06-23 DIAGNOSIS — M7989 Other specified soft tissue disorders: Secondary | ICD-10-CM

## 2020-06-23 DIAGNOSIS — R0989 Other specified symptoms and signs involving the circulatory and respiratory systems: Secondary | ICD-10-CM
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
## 2020-06-23 NOTE — Patient Instructions (Signed)
° ° ° °  If you have lab work done today you will be contacted with your lab results within the next 2 weeks.  If you have not heard from us then please contact us. The fastest way to get your results is to register for My Chart. ° ° °IF you received an x-ray today, you will receive an invoice from [facility]. Please contact [facility] at [phone] with questions or concerns regarding your invoice.  ° °IF you received labwork today, you will receive an invoice from [facility]. Please contact [facility] at [phone] with questions or concerns regarding your invoice.  ° °Our billing staff will not be able to assist you with questions regarding bills from these companies. ° °You will be contacted with the lab results as soon as they are available. The fastest way to get your results is to activate your My Chart account. Instructions are located on the last page of this paperwork. If you have not heard from us regarding the results in 2 weeks, please contact this office. °  ° ° ° °

## 2020-06-24 ENCOUNTER — Telehealth: Payer: Self-pay | Admitting: Family Medicine

## 2020-06-24 ENCOUNTER — Ambulatory Visit (HOSPITAL_COMMUNITY)
Admission: RE | Admit: 2020-06-24 | Discharge: 2020-06-24 | Disposition: A | Discharge status: Home / Self Care | Payer: Medicare Other | Source: Ambulatory Visit | Attending: Registered Nurse | Admitting: Registered Nurse

## 2020-06-24 DIAGNOSIS — R0989 Other specified symptoms and signs involving the circulatory and respiratory systems: Secondary | ICD-10-CM | POA: Insufficient documentation

## 2020-06-24 LAB — BASIC METABOLIC PANEL
BUN/Creatinine Ratio: 19 (ref 10–24)
BUN: 25 mg/dL (ref 8–27)
CO2: 21 mmol/L (ref 20–29)
Calcium: 9.8 mg/dL (ref 8.6–10.2)
Chloride: 107 mmol/L — ABNORMAL HIGH (ref 96–106)
Creatinine, Ser: 1.34 mg/dL — ABNORMAL HIGH (ref 0.76–1.27)
GFR calc Af Amer: 61 mL/min/{1.73_m2} (ref >59)
GFR calc non Af Amer: 53 mL/min/{1.73_m2} — ABNORMAL LOW (ref >59)
Glucose: 92 mg/dL (ref 65–99)
Potassium: 4.5 mmol/L (ref 3.5–5.2)
Sodium: 140 mmol/L (ref 134–144)

## 2020-06-24 LAB — D-DIMER, QUANTITATIVE: D-DIMER: 1.57 mg/L FEU — ABNORMAL HIGH (ref 0.00–0.49)

## 2020-06-24 LAB — CBC WITH DIFFERENTIAL
Basophils Absolute: 0.1 10*3/uL (ref 0.0–0.2)
Basos: 1 %
EOS (ABSOLUTE): 0.3 10*3/uL (ref 0.0–0.4)
Eos: 3 %
Hematocrit: 44 % (ref 37.5–51.0)
Hemoglobin: 14.5 g/dL (ref 13.0–17.7)
Immature Grans (Abs): 0 10*3/uL (ref 0.0–0.1)
Immature Granulocytes: 0 %
Lymphocytes Absolute: 1.5 10*3/uL (ref 0.7–3.1)
Lymphs: 17 %
MCH: 30.7 pg (ref 26.6–33.0)
MCHC: 33 g/dL (ref 31.5–35.7)
MCV: 93 fL (ref 79–97)
Monocytes Absolute: 0.7 10*3/uL (ref 0.1–0.9)
Monocytes: 8 %
Neutrophils Absolute: 6.3 10*3/uL (ref 1.4–7.0)
Neutrophils: 71 %
RBC: 4.73 x10E6/uL (ref 4.14–5.80)
RDW: 13 % (ref 11.6–15.4)
WBC: 8.8 10*3/uL (ref 3.4–10.8)

## 2020-06-24 NOTE — Telephone Encounter (Signed)
I did - thank you - will contact patient today  Rich

## 2020-06-24 NOTE — Telephone Encounter (Signed)
Nurse called from [facility] with pts results stated that it was negative DVT and a possible ruptured vacso cyst. Please advise.

## 2020-06-24 NOTE — Telephone Encounter (Signed)
Not sure if you received this msg on this patient or not.

## 2020-07-01 DIAGNOSIS — M25561 Pain in right knee: Secondary | ICD-10-CM | POA: Diagnosis not present

## 2020-07-01 DIAGNOSIS — Z23 Encounter for immunization: Secondary | ICD-10-CM | POA: Diagnosis not present

## 2020-07-01 DIAGNOSIS — Z96651 Presence of right artificial knee joint: Secondary | ICD-10-CM | POA: Diagnosis not present

## 2020-07-15 DIAGNOSIS — Z961 Presence of intraocular lens: Secondary | ICD-10-CM | POA: Diagnosis not present

## 2020-07-15 DIAGNOSIS — H524 Presbyopia: Secondary | ICD-10-CM | POA: Diagnosis not present

## 2020-08-11 ENCOUNTER — Other Ambulatory Visit: Payer: Self-pay | Admitting: Cardiology

## 2020-08-13 DIAGNOSIS — Z23 Encounter for immunization: Secondary | ICD-10-CM | POA: Diagnosis not present

## 2020-08-23 ENCOUNTER — Other Ambulatory Visit: Payer: Self-pay | Admitting: Family Medicine

## 2020-08-23 DIAGNOSIS — G4700 Insomnia, unspecified: Secondary | ICD-10-CM

## 2020-08-23 NOTE — Telephone Encounter (Signed)
Requested Prescriptions  Pending Prescriptions Disp Refills   buPROPion (WELLBUTRIN SR) 200 MG 12 hr tablet [Pharmacy Med Name: BUPROPION SR 200MG  TABLETS (12 HR)] 180 tablet 0    Sig: TAKE 2 TABLETS(400 MG) BY MOUTH DAILY     Psychiatry: Antidepressants - bupropion Failed - 08/23/2020  5:01 PM      Failed - Last BP in normal range    BP Readings from Last 1 Encounters:  06/23/20 (!) 142/73         Passed - Valid encounter within last 6 months    Recent Outpatient Visits          2 months ago Fall, initial encounter   [facility], Delfino Lovett, NP   3 months ago Neck mass   [facility], Fenton Malling, MD   5 months ago Essential hypertension   [facility], Ranell Patrick, MD   6 months ago Rib contusion, right, subsequent encounter   [facility], Ranell Patrick, MD   7 months ago Rib pain on right side   [facility], Arlie Solomons, MD      Future Appointments            In 5 days Wendie Agreste, MD [facility] [place]   In 9 months Patwardhan, Reynold Bowen, MD [facility]

## 2020-08-28 ENCOUNTER — Other Ambulatory Visit: Payer: Self-pay

## 2020-08-28 ENCOUNTER — Encounter: Payer: Self-pay | Admitting: Family Medicine

## 2020-08-28 ENCOUNTER — Ambulatory Visit (INDEPENDENT_AMBULATORY_CARE_PROVIDER_SITE_OTHER): Payer: Medicare Other | Admitting: Family Medicine

## 2020-08-28 VITALS — BP 122/70 | HR 67 | Temp 98.6°F | Ht 71.0 in | Wt 215.0 lb

## 2020-08-28 DIAGNOSIS — Z0000 Encounter for general adult medical examination without abnormal findings: Secondary | ICD-10-CM

## 2020-08-28 DIAGNOSIS — Z131 Encounter for screening for diabetes mellitus: Secondary | ICD-10-CM

## 2020-08-28 DIAGNOSIS — G4700 Insomnia, unspecified: Secondary | ICD-10-CM

## 2020-08-28 DIAGNOSIS — R739 Hyperglycemia, unspecified: Secondary | ICD-10-CM

## 2020-08-28 DIAGNOSIS — I10 Essential (primary) hypertension: Secondary | ICD-10-CM

## 2020-08-28 DIAGNOSIS — E785 Hyperlipidemia, unspecified: Secondary | ICD-10-CM

## 2020-08-28 DIAGNOSIS — I7121 Aneurysm of the ascending aorta, without rupture: Secondary | ICD-10-CM

## 2020-08-28 DIAGNOSIS — I712 Thoracic aortic aneurysm, without rupture: Secondary | ICD-10-CM

## 2020-08-28 DIAGNOSIS — R911 Solitary pulmonary nodule: Secondary | ICD-10-CM

## 2020-08-28 LAB — LIPID PANEL

## 2020-08-28 MED ORDER — ATORVASTATIN CALCIUM 20 MG PO TABS: ORAL_TABLET | ORAL | 2 refills | Status: DC

## 2020-08-28 MED ORDER — LOSARTAN POTASSIUM 100 MG PO TABS: 100.0000 mg | ORAL_TABLET | Freq: Every day | ORAL | 3 refills | Status: DC

## 2020-08-28 MED ORDER — BUPROPION HCL ER (SR) 200 MG PO TB12: ORAL_TABLET | ORAL | 1 refills | Status: DC

## 2020-08-28 MED ORDER — ATENOLOL 25 MG PO TABS: ORAL_TABLET | ORAL | 2 refills | Status: DC

## 2020-08-28 MED ORDER — PANTOPRAZOLE SODIUM 20 MG PO TBEC: 20.0000 mg | DELAYED_RELEASE_TABLET | Freq: Every day | ORAL | 2 refills | Status: DC

## 2020-08-28 NOTE — Patient Instructions (Addendum)
Low intensity exercise like walking, bike riding - goal of 138mn per week.  No med changes today.  I will order CT of chest and labs today, but clarify with your insurance company whether or not they will cover that CT scan as when I ordered it today there is a possibility you may have to pay for that out-of-pocket.  Let me know if there are questions.   Preventive Care 73Years and Older, Male Preventive care refers to lifestyle choices and visits with your health care provider that can promote health and wellness. This includes:  A yearly physical exam. This is also called an annual well check.  Regular dental and eye exams.  Immunizations.  Screening for certain conditions.  Healthy lifestyle choices, such as diet and exercise. What can I expect for my preventive care visit? Physical exam Your health care provider will check:  Height and weight. These may be used to calculate body mass index (BMI), which is a measurement that tells if you are at a healthy weight.  Heart rate and blood pressure.  Your skin for abnormal spots. Counseling Your health care provider may ask you questions about:  Alcohol, tobacco, and drug use.  Emotional well-being.  Home and relationship well-being.  Sexual activity.  Eating habits.  History of falls.  Memory and ability to understand (cognition).  Work and work eStatistician What immunizations do I need?  Influenza (flu) vaccine  This is recommended every year. Tetanus, diphtheria, and pertussis (Tdap) vaccine  You may need a Td booster every 10 years. Varicella (chickenpox) vaccine  You may need this vaccine if you have not already been vaccinated. Zoster (shingles) vaccine  You may need this after age 73 Pneumococcal conjugate (PCV13) vaccine  One dose is recommended after age 73 Pneumococcal polysaccharide (PPSV23) vaccine  One dose is recommended after age 73 Measles, mumps, and rubella (MMR) vaccine  You may  need at least one dose of MMR if you were born in 1956 or later. You may also need a second dose. Meningococcal conjugate (MenACWY) vaccine  You may need this if you have certain conditions. Hepatitis A vaccine  You may need this if you have certain conditions or if you travel or work in places where you may be exposed to hepatitis A. Hepatitis B vaccine  You may need this if you have certain conditions or if you travel or work in places where you may be exposed to hepatitis B. Haemophilus influenzae type b (Hib) vaccine  You may need this if you have certain conditions. You may receive vaccines as individual doses or as more than one vaccine together in one shot (combination vaccines). Talk with your health care provider about the risks and benefits of combination vaccines. What tests do I need? Blood tests  Lipid and cholesterol levels. These may be checked every 5 years, or more frequently depending on your overall health.  Hepatitis C test.  Hepatitis B test. Screening  Lung cancer screening. You may have this screening every year starting at age 73135if you have a 30-pack-year history of smoking and currently smoke or have quit within the past 15 years.  Colorectal cancer screening. All adults should have this screening starting at age 73143and continuing until age 73 Your health care provider may recommend screening at age 789if you are at increased risk. You will have tests every 1-10 years, depending on your results and the type of screening test.  Prostate cancer screening. Recommendations will vary  depending on your family history and other risks.  Diabetes screening. This is done by checking your blood sugar (glucose) after you have not eaten for a while (fasting). You may have this done every 1-3 years.  Abdominal aortic aneurysm (AAA) screening. You may need this if you are a current or former smoker.  Sexually transmitted disease (STD) testing. Follow these instructions  at home: Eating and drinking  Eat a diet that includes fresh fruits and vegetables, whole grains, lean protein, and low-fat dairy products. Limit your intake of foods with high amounts of sugar, saturated fats, and salt.  Take vitamin and mineral supplements as recommended by your health care provider.  Do not drink alcohol if your health care provider tells you not to drink.  If you drink alcohol: ? Limit how much you have to 0-2 drinks a day. ? Be aware of how much alcohol is in your drink. In the [place] one drink equals one 12 oz bottle of beer (355 mL), one 5 oz glass of wine (148 mL), or one 1 oz glass of hard liquor (44 mL). Lifestyle  Take daily care of your teeth and gums.  Stay active. Exercise for at least 30 minutes on 5 or more days each week.  Do not use any products that contain nicotine or tobacco, such as cigarettes, e-cigarettes, and chewing tobacco. If you need help quitting, ask your health care provider.  If you are sexually active, practice safe sex. Use a condom or other form of protection to prevent STIs (sexually transmitted infections).  Talk with your health care provider about taking a low-dose aspirin or statin. What's next?  Visit your health care provider once a year for a well check visit.  Ask your health care provider how often you should have your eyes and teeth checked.  Stay up to date on all vaccines. This information is not intended to replace advice given to you by your health care provider. Make sure you discuss any questions you have with your health care provider. Document Revised: 09/07/2018 Document Reviewed: 09/07/2018 Elsevier Patient Education  [place] Prevention in the Home, Adult Falls can cause injuries and can affect people from all age groups. There are many simple things that you can do to make your home safe and to help prevent falls. Ask for help when making these changes, if needed. What actions can I take  to prevent falls? General instructions  Use good lighting in all rooms. Replace any light bulbs that burn out.  Turn on lights if it is dark. Use night-lights.  Place frequently used items in easy-to-reach places. Lower the shelves around your home if necessary.  Set up furniture so that there are clear paths around it. Avoid moving your furniture around.  Remove throw rugs and other tripping hazards from the floor.  Avoid walking on wet floors.  Fix any uneven floor surfaces.  Add color or contrast paint or tape to grab bars and handrails in your home. Place contrasting color strips on the first and last steps of stairways.  When you use a stepladder, make sure that it is completely opened and that the sides are firmly locked. Have someone hold the ladder while you are using it. Do not climb a closed stepladder.  Be aware of any and all pets. What can I do in the bathroom?      Keep the floor dry. Immediately clean up any water that spills onto the floor.  Remove  soap buildup in the tub or shower on a regular basis.  Use non-skid mats or decals on the floor of the tub or shower.  Attach bath mats securely with double-sided, non-slip rug tape.  If you need to sit down while you are in the shower, use a plastic, non-slip stool.  Install grab bars by the toilet and in the tub and shower. Do not use towel bars as grab bars. What can I do in the bedroom?  Make sure that a bedside light is easy to reach.  Do not use oversized bedding that drapes onto the floor.  Have a firm chair that has side arms to use for getting dressed. What can I do in the kitchen?  Clean up any spills right away.  If you need to reach for something above you, use a sturdy step stool that has a grab bar.  Keep electrical cables out of the way.  Do not use floor polish or wax that makes floors slippery. If you must use wax, make sure that it is non-skid floor wax. What can I do in the  stairways?  Do not leave any items on the stairs.  Make sure that you have a light switch at the top of the stairs and the bottom of the stairs. Have them installed if you do not have them.  Make sure that there are handrails on both sides of the stairs. Fix handrails that are broken or loose. Make sure that handrails are as long as the stairways.  Install non-slip stair treads on all stairs in your home.  Avoid having throw rugs at the top or bottom of stairways, or secure the rugs with carpet tape to prevent them from moving.  Choose a carpet design that does not hide the edge of steps on the stairway.  Check any carpeting to make sure that it is firmly attached to the stairs. Fix any carpet that is loose or worn. What can I do on the outside of my home?  Use bright outdoor lighting.  Regularly repair the edges of walkways and driveways and fix any cracks.  Remove high doorway thresholds.  Trim any shrubbery on the main path into your home.  Regularly check that handrails are securely fastened and in good repair. Both sides of any steps should have handrails.  Install guardrails along the edges of any raised decks or porches.  Clear walkways of debris and clutter, including tools and rocks.  Have leaves, snow, and ice cleared regularly.  Use sand or salt on walkways during winter months.  In the garage, clean up any spills right away, including grease or oil spills. What other actions can I take?  Wear closed-toe shoes that fit well and support your feet. Wear shoes that have rubber soles or low heels.  Use mobility aids as needed, such as canes, walkers, scooters, and crutches.  Review your medicines with your health care provider. Some medicines can cause dizziness or changes in blood pressure, which increase your risk of falling. Talk with your health care provider about other ways that you can decrease your risk of falls. This may include working with a physical  therapist or trainer to improve your strength, balance, and endurance. Where to find more information  Centers for Disease Control and Prevention, [employer]: WebmailGuide.co.za  [employer] on Aging: BrainJudge.co.uk Contact a health care provider if:  You are afraid of falling at home.  You feel weak, drowsy, or dizzy at home.  You fall  at home. Summary  There are many simple things that you can do to make your home safe and to help prevent falls.  Ways to make your home safe include removing tripping hazards and installing grab bars in the bathroom.  Ask for help when making these changes in your home. This information is not intended to replace advice given to you by your health care provider. Make sure you discuss any questions you have with your health care provider. Document Revised: 08/26/2017 Document Reviewed: 04/28/2017 Elsevier Patient Education  [employer].    If you have lab work done today you will be contacted with your lab results within the next 2 weeks.  If you have not heard from [name] then please contact us. The fastest way to get your results is to register for My Chart.   IF you received an x-ray today, you will receive an invoice from [facility]. Please contact [facility] at [phone] with questions or concerns regarding your invoice.   IF you received labwork today, you will receive an invoice from [facility]. Please contact [facility] at [phone] with questions or concerns regarding your invoice.   Our billing staff will not be able to assist you with questions regarding bills from these companies.  You will be contacted with the lab results as soon as they are available. The fastest way to get your results is to activate your My Chart account. Instructions are located on the last page of this paperwork. If you have not heard from [name] regarding the results in 2 weeks, please contact this office.

## 2020-08-28 NOTE — Progress Notes (Addendum)
Subjective:  Patient ID: Joseph Parrot., male    DOB: 1946-07-19  Age: 74 y.o. MRN: [id number]  CC:  Chief Complaint  Patient presents with  . Medication Refill    Pt reports some stiffness, but over all feels good with no complaints.    HPI Joseph Parrot. presents for  Annual wellness exam Care team: Primary care provider, me Nephrology, Dr. Carolin Sicks Orthopedics Dr. Mayer Camel Cardiology, Dr. Virgina Jock Dermatology, Dr. Elvera Lennox  Cardiac History of ascending aortic aneurysm, hypertension, CAD, CKD. cardiology visit August 23 noted.   Hypertension well controlled on atenolol, losartan at that time.  Plan for repeat echocardiogram for ascending aortic aneurysm in August 2022.  4.2 cm in August 2020.  Nonobstructive CAD, continue statin and aspirin treatment - on ASA QOD.  No new side effects with meds including statin.  BP Readings from Last 3 Encounters:  08/28/20 122/70  06/23/20 (!) 142/73  06/21/20 130/81   Lab Results  Component Value Date   CREATININE 1.34 (H) 06/23/2020  eGFR 53.   Lab Results  Component Value Date   CHOL 117 06/11/2019   HDL 54 06/11/2019   LDLCALC 45 06/11/2019   TRIG 96 06/11/2019   CHOLHDL 2.2 06/11/2019   History of pulmonary nodule CT April 10, 2019, stable 8 mm solid lingular pulmonary nodule, likely benign, option of CT repeat in 12 months.  He has been followed by pulmonary, Dr. Melvyn Novas.  Prior notes reviewed.  Hyperglycemia Glucose 102 on April 2021 labs.  Most recent testing in August and subsequently was normal  GERD: protonix once per day - stable. No PUD. Has treated upper airway cough - stable on PPI.   utd on colonoscopy.   Fall Risk  08/28/2020 06/23/2020 05/16/2020 02/27/2020 01/28/2020  Falls in the past year? 1 1 0 0 1  Comment - - - - -  Number falls in past yr: 0 0 0 - 1  Injury with Fall? 1 0 0 - 1  Comment - - - - -  Risk Factor Category  - - - - -  Risk for fall due to : - - - - -  Risk for fall due to: Comment - -  - - -  Follow up _0   Undergoing physical therapy for orthopedic issues. Fall due to knee buckling. No falls since PT for strength/balance.  Lighting at home - adequate Stairs: 1 flight with handrail.  Loose rugs - some downstairs.  Grab bars in bathroom - some.   Immunization History  Administered Date(s) Administered  . Fluad Quad(high Dose 65+) 06/11/2019  . Influenza, High Dose Seasonal PF 07/26/2018  . Influenza,inj,Quad PF,6+ Mos 07/07/2016  . Influenza-Unspecified 07/13/2012, 10/01/2015, 07/05/2017, 08/07/2020  . PFIZER SARS-COV-2 Vaccination 11/04/2019, 11/28/2019  . Pneumococcal Conjugate-13 04/10/2014  . Pneumococcal Polysaccharide-23 12/17/2015  . Pneumococcal-Unspecified 09/27/2010  . Tdap 02/09/2015  . Zoster 09/27/2010  . Zoster Recombinat (Shingrix) 02/04/2017, 06/28/2017     Depression: Stable with wellbutrin.  Depression screen [facility] 2/9 08/28/2020 06/23/2020 05/16/2020 02/27/2020 01/28/2020  Decreased Interest 0 0 0 0 0  Down, Depressed, Hopeless 0 0 0 0 0  PHQ - 2 Score 0 0 0 0 0   Functional Status Survey: Is the patient deaf or have difficulty hearing?: Yes Does the patient have difficulty seeing, even when wearing glasses/contacts?: No Does the patient have difficulty concentrating, remembering, or making decisions?: No Does the patient have difficulty  walking or climbing stairs?: No Does the patient have difficulty dressing or bathing?: No Does the patient have difficulty doing errands alone such as visiting a doctor's office or shopping?: No Hearing difficulty, has hearing aids - new 6 months ago.   6CIT Screen 08/28/2020 08/27/2019 01/04/2019 08/08/2017  What Year? 0 points 0 points 0 points 0 points  What month? 0 points 0 points 0 points 0 points  What time? 0 points 0 points 0 points 0 points  Count back from 20 0 points 0  points 0 points 0 points  Months in reverse 0 points 0 points 0 points 0 points  Repeat phrase 0 points 0 points 0 points 0 points  Total Score 0 0 0 0     Office Visit from 08/28/2020 in [facility]  AUDIT-C Score 3      Hearing Screening   125Hz  250Hz  500Hz  1000Hz  2000Hz  3000Hz  4000Hz  6000Hz  8000Hz   Right ear:           Left ear:             Visual Acuity Screening   Right eye Left eye Both eyes  Without correction: 20/40 20/25 20/15   With correction:     optho - Dr. Valetta Close? appt in past 1.37months.   Dental: every 6 months - appt next week.   Exercise: some at home. Prior bike riding. Not at 120min at this time.   Advanced directives -information provided.  Hematuria - chronic/longstanding, has had eval by urology. Prior CT, cystoscopy. No concerns or recommended follow up.    History Patient Active Problem List   Diagnosis Date Noted  . OSA (obstructive sleep apnea) 06/01/2019  . Aneurysm of ascending aorta (HCC) 05/18/2019  . Coronary artery disease involving native coronary artery of native heart without angina pectoris 05/18/2019  . Essential hypertension 05/18/2019  . History of total hip arthroplasty, right 08/14/2018  . Osteoarthritis of right hip 08/12/2018  . Palpitations 01/29/2018  . SCC (squamous cell carcinoma) 01/28/2018  . Solitary pulmonary nodule 10/03/2016  . Cough 09/29/2016  . Seborrheic keratosis 02/26/2014  . Rotator cuff tear arthropathy of right shoulder 01/10/2014  . 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 S/P total knee arthroplasty 10/05/2011  . 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 OA (osteoarthritis) of knee 03/01/2011  . CKD (chronic kidney disease) 05/13/2010   Past Medical History:  Diagnosis Date  . Arthritis   . CAD (coronary artery disease)   . Chronic kidney disease   . Complication of anesthesia    reports " i lose my blood pressure after anesthesia; the drops usually happens in recovery"    . Depression   . Hand pain    currently being evaluated by rheumatology for dx   . Hematuria   . Hypertension   . Rapid heart beat    rpeorts  " 2 months ago i had a rapid heart beat that lasted 45 min to an hour, ive experience this twice in the last t10 years but only seconds long" " i was evaluated by cardiology Dr Landry Mellow who did a 30 day monitor and ECHO and said i have occasional [PATS] ? , denies chest pain nor LOC  during these episodes; HR today is WDL   . Sleep apnea    CPAP    Past Surgical History:  Procedure Laterality Date  . CATARACT EXTRACTION, BILATERAL    . HIP SURGERY Left 2012  . JOINT REPLACEMENT    .  REPLACEMENT TOTAL KNEE Right    2001,2006,2009,2013  . REPLACEMENT TOTAL KNEE Left 2011  . TOTAL HIP ARTHROPLASTY Right 08/14/2018   Procedure: TOTAL HIP ARTHROPLASTY ANTERIOR APPROACH;  Surgeon: Gean Birchwood, MD;  Location: [facility];  Service: Orthopedics;  Laterality: Right;   Allergies  Allergen Reactions  . Nsaids     Affects creatinine  . Dilaudid [Hydromorphone Hcl] Other (See Comments)    Suicidal  . Sulfa Antibiotics     Other reaction(s): GI Intolerance  . Hydromorphone Anxiety    Suicidal    Prior to Admission medications   Medication Sig Start Date End Date Taking? Authorizing Provider  aspirin EC 81 MG tablet Take 81 mg by mouth every other day.    Yes [provider]  atenolol (TENORMIN) 25 MG tablet TAKE 1 TABLET(25 MG) BY MOUTH DAILY 02/27/20  Yes Shade Flood, MD  atorvastatin (LIPITOR) 20 MG tablet TAKE 1 TABLET(20 MG) BY MOUTH DAILY 02/27/20  Yes Shade Flood, MD  buPROPion [facility] SR) 200 MG 12 hr tablet TAKE 2 TABLETS(400 MG) BY MOUTH DAILY 08/23/20  Yes Shade Flood, MD  losartan (COZAAR) 100 MG tablet Take 1 tablet (100 mg total) by mouth daily. 02/27/20  Yes Shade Flood, MD  pantoprazole (PROTONIX) 20 MG tablet Take 1 tablet (20 mg total) by mouth daily. 02/27/20  Yes Shade Flood, MD  gabapentin  (NEURONTIN) 100 MG capsule TAKE ONE CAPSULE BY MOUTH THREE TIMES DAILY Patient not taking: Reported on 06/23/2020 06/16/20   Nyoka Cowden, MD   Social History   Socioeconomic History  . Marital status: Married    Spouse name: Not on file  . Number of children: 2  . Years of education: Not on file  . Highest education level: Bachelor's degree (e.g., BA, AB, BS)  Occupational History  . Occupation: Retired  Tobacco Use  . Smoking status: Former Smoker    Packs/day: 0.50    Years: 3.00    Pack years: 1.50    Types: Cigarettes    Quit date: 1971    Years since quitting: 50.9  . Smokeless tobacco: Never Used  Vaping Use  . Vaping Use: Never used  Substance and Sexual Activity  . Alcohol use: Yes    Alcohol/week: 3.0 standard drinks    Types: 3 Cans of beer per week    Comment: max 3 beers/week with a meal.  . Drug use: No    Comment: former user of marijuana, quit in 1971  . Sexual activity: Yes  Other Topics Concern  . Not on file  Social History Narrative   Lives with his wife.   Education: College   Exercise: No   Drinks about 2 large coffees per day   Right handed      Spent many years in [place].  Moved to [place] to be near their children.  Daughter lives in [place].  Their son has lived in the [place] area with plans to move to [place] in summer 2019.   Social Determinants of Health   Financial Resource Strain:   . Difficulty of Paying Living Expenses: Not on file  Food Insecurity:   . Worried About Programme researcher, broadcasting/film/video in the Last Year: Not on file  . Ran Out of Food in the Last Year: Not on file  Transportation Needs:   . Lack of Transportation (Medical): Not on file  . Lack of Transportation (Non-Medical): Not on file  Physical Activity:   .  Days of Exercise per Week: Not on file  . Minutes of Exercise per Session: Not on file  Stress:   . Feeling of Stress : Not on file  Social Connections:   . Frequency of Communication with Friends and Family: Not  on file  . Frequency of Social Gatherings with Friends and Family: Not on file  . Attends Religious Services: Not on file  . Active Member of Clubs or Organizations: Not on file  . Attends Archivist Meetings: Not on file  . Marital Status: Not on file  Intimate Partner Violence:   . Fear of Current or Ex-Partner: Not on file  . Emotionally Abused: Not on file  . Physically Abused: Not on file  . Sexually Abused: Not on file    Review of Systems Review of systems positive for hearing loss, tinnitus, blood in urine, joint pain. All chronic issues without recent changes.   Objective:   Vitals:   08/28/20 0929  BP: 122/70  Pulse: 67  Temp: 98.6 F (37 C)  TempSrc: Temporal  SpO2: 96%  Weight: 215 lb (97.5 kg)  Height: $Remove'5\' 11"'[id number]$  (1.803 m)     Physical Exam Vitals reviewed.  Constitutional:      Appearance: He is well-developed.  HENT:     Head: Normocephalic and atraumatic.     Right Ear: External ear normal.     Left Ear: External ear normal.  Eyes:     Conjunctiva/sclera: Conjunctivae normal.     Pupils: Pupils are equal, round, and reactive to light.  Neck:     Thyroid: No thyromegaly.  Cardiovascular:     Rate and Rhythm: Normal rate and regular rhythm.     Heart sounds: Normal heart sounds.  Pulmonary:     Effort: Pulmonary effort is normal. No respiratory distress.     Breath sounds: Normal breath sounds. No wheezing.  Abdominal:     General: There is no distension.     Palpations: Abdomen is soft.     Tenderness: There is no abdominal tenderness.  Musculoskeletal:        General: No tenderness. Normal range of motion.     Cervical back: Normal range of motion and neck supple.  Lymphadenopathy:     Cervical: No cervical adenopathy.  Skin:    General: Skin is warm and dry.  Neurological:     Mental Status: He is alert and oriented to person, place, and time.     Deep Tendon Reflexes: Reflexes are normal and symmetric.  Psychiatric:         Behavior: Behavior normal.     Assessment & Plan:  Blaike Vickers. is a 74 y.o. male . Medicare annual wellness visit, subsequent  - - anticipatory guidance as below in AVS, screening labs if needed. Health maintenance items as above in HPI discussed/recommended as applicable.  - no concerning responses on depression, fall, or functional status screening. Any positive responses noted as above. Advanced directives discussed as in CHL.   Essential hypertension - Plan: losartan (COZAAR) 100 MG tablet, atenolol (TENORMIN) 25 MG tablet, Lipid panel, Comprehensive metabolic panel  -  Stable, tolerating current regimen. Medications refilled. Labs pending as above.   Aneurysm of ascending aorta (HCC) - Plan: losartan (COZAAR) 100 MG tablet, atenolol (TENORMIN) 25 MG tablet  - stable, continue cardiology follow up.   Screening for diabetes mellitus (DM) - Plan: Hemoglobin A1c Hyperglycemia - Plan: Hemoglobin A1c  - borderline prior - check A1c.   Hyperlipidemia,  unspecified hyperlipidemia type - Plan: atorvastatin (LIPITOR) 20 MG tablet  -  Stable, tolerating current regimen. Medications refilled. Labs pending as above.   Solitary pulmonary nodule - Plan: CT CHEST NODULE FOLLOW UP LOW DOSE W/Oncidental pulmonary nodule, greater than or equal to 11mm - Plan: CT CHEST NODULE FOLLOW UP LOW DOSE W/O  - CT chest ordered, ABN printed, advised to discuss with insurance first regarding coverage.   Insomnia, unspecified type - Plan: buPROPion (WELLBUTRIN SR) 200 MG 12 hr tablet  - stable.   I  Meds ordered this encounter  Medications  . atorvastatin (LIPITOR) 20 MG tablet    Sig: TAKE 1 TABLET(20 MG) BY MOUTH DAILY    Dispense:  90 tablet    Refill:  2  . losartan (COZAAR) 100 MG tablet    Sig: Take 1 tablet (100 mg total) by mouth daily.    Dispense:  90 tablet    Refill:  3  . atenolol (TENORMIN) 25 MG tablet    Sig: TAKE 1 TABLET(25 MG) BY MOUTH DAILY    Dispense:  90 tablet    Refill:   2  . pantoprazole (PROTONIX) 20 MG tablet    Sig: Take 1 tablet (20 mg total) by mouth daily.    Dispense:  90 tablet    Refill:  2  . buPROPion (WELLBUTRIN SR) 200 MG 12 hr tablet    Sig: Ok to place on hold if needed.TAKE 2 TABLETS(400 MG) BY MOUTH DAILY    Dispense:  180 tablet    Refill:  1   Patient Instructions   Low intensity exercise like walking, bike riding - goal of 154min per week.  No med changes today.  I will order CT of chest and labs today, but clarify with your insurance company whether or not they will cover that CT scan as when I ordered it today there is a possibility you may have to pay for that out-of-pocket.  Let me know if there are questions.   Preventive Care 27 Years and Older, Male Preventive care refers to lifestyle choices and visits with your health care provider that can promote health and wellness. This includes:  A yearly physical exam. This is also called an annual well check.  Regular dental and eye exams.  Immunizations.  Screening for certain conditions.  Healthy lifestyle choices, such as diet and exercise. What can I expect for my preventive care visit? Physical exam Your health care provider will check:  Height and weight. These may be used to calculate body mass index (BMI), which is a measurement that tells if you are at a healthy weight.  Heart rate and blood pressure.  Your skin for abnormal spots. Counseling Your health care provider may ask you questions about:  Alcohol, tobacco, and drug use.  Emotional well-being.  Home and relationship well-being.  Sexual activity.  Eating habits.  History of falls.  Memory and ability to understand (cognition).  Work and work Statistician. What immunizations do I need?  Influenza (flu) vaccine  This is recommended every year. Tetanus, diphtheria, and pertussis (Tdap) vaccine  You may need a Td booster every 10 years. Varicella (chickenpox) vaccine  You may need this  vaccine if you have not already been vaccinated. Zoster (shingles) vaccine  You may need this after age 15. Pneumococcal conjugate (PCV13) vaccine  One dose is recommended after age 49. Pneumococcal polysaccharide (PPSV23) vaccine  One dose is recommended after age 37. Measles, mumps, and rubella (MMR) vaccine  You may need at least one dose of MMR if you were born in 1957 or later. You may also need a second dose. Meningococcal conjugate (MenACWY) vaccine  You may need this if you have certain conditions. Hepatitis A vaccine  You may need this if you have certain conditions or if you travel or work in places where you may be exposed to hepatitis A. Hepatitis B vaccine  You may need this if you have certain conditions or if you travel or work in places where you may be exposed to hepatitis B. Haemophilus influenzae type b (Hib) vaccine  You may need this if you have certain conditions. You may receive vaccines as individual doses or as more than one vaccine together in one shot (combination vaccines). Talk with your health care provider about the risks and benefits of combination vaccines. What tests do I need? Blood tests  Lipid and cholesterol levels. These may be checked every 5 years, or more frequently depending on your overall health.  Hepatitis C test.  Hepatitis B test. Screening  Lung cancer screening. You may have this screening every year starting at age 54 if you have a 30-pack-year history of smoking and currently smoke or have quit within the past 15 years.  Colorectal cancer screening. All adults should have this screening starting at age 55 and continuing until age 21. Your health care provider may recommend screening at age 17 if you are at increased risk. You will have tests every 1-10 years, depending on your results and the type of screening test.  Prostate cancer screening. Recommendations will vary depending on your family history and other  risks.  Diabetes screening. This is done by checking your blood sugar (glucose) after you have not eaten for a while (fasting). You may have this done every 1-3 years.  Abdominal aortic aneurysm (AAA) screening. You may need this if you are a current or former smoker.  Sexually transmitted disease (STD) testing. Follow these instructions at home: Eating and drinking  Eat a diet that includes fresh fruits and vegetables, whole grains, lean protein, and low-fat dairy products. Limit your intake of foods with high amounts of sugar, saturated fats, and salt.  Take vitamin and mineral supplements as recommended by your health care provider.  Do not drink alcohol if your health care provider tells you not to drink.  If you drink alcohol: ? Limit how much you have to 0-2 drinks a day. ? Be aware of how much alcohol is in your drink. In the [place] one drink equals one 12 oz bottle of beer (355 mL), one 5 oz glass of wine (148 mL), or one 1 oz glass of hard liquor (44 mL). Lifestyle  Take daily care of your teeth and gums.  Stay active. Exercise for at least 30 minutes on 5 or more days each week.  Do not use any products that contain nicotine or tobacco, such as cigarettes, e-cigarettes, and chewing tobacco. If you need help quitting, ask your health care provider.  If you are sexually active, practice safe sex. Use a condom or other form of protection to prevent STIs (sexually transmitted infections).  Talk with your health care provider about taking a low-dose aspirin or statin. What's next?  Visit your health care provider once a year for a well check visit.  Ask your health care provider how often you should have your eyes and teeth checked.  Stay up to date on all vaccines. This information is not intended to  replace advice given to you by your health care provider. Make sure you discuss any questions you have with your health care provider. Document Revised: 09/07/2018 Document  Reviewed: 09/07/2018 Elsevier Patient Education  Aneth Prevention in the Home, Adult Falls can cause injuries and can affect people from all age groups. There are many simple things that you can do to make your home safe and to help prevent falls. Ask for help when making these changes, if needed. What actions can I take to prevent falls? General instructions  Use good lighting in all rooms. Replace any light bulbs that burn out.  Turn on lights if it is dark. Use night-lights.  Place frequently used items in easy-to-reach places. Lower the shelves around your home if necessary.  Set up furniture so that there are clear paths around it. Avoid moving your furniture around.  Remove throw rugs and other tripping hazards from the floor.  Avoid walking on wet floors.  Fix any uneven floor surfaces.  Add color or contrast paint or tape to grab bars and handrails in your home. Place contrasting color strips on the first and last steps of stairways.  When you use a stepladder, make sure that it is completely opened and that the sides are firmly locked. Have someone hold the ladder while you are using it. Do not climb a closed stepladder.  Be aware of any and all pets. What can I do in the bathroom?      Keep the floor dry. Immediately clean up any water that spills onto the floor.  Remove soap buildup in the tub or shower on a regular basis.  Use non-skid mats or decals on the floor of the tub or shower.  Attach bath mats securely with double-sided, non-slip rug tape.  If you need to sit down while you are in the shower, use a plastic, non-slip stool.  Install grab bars by the toilet and in the tub and shower. Do not use towel bars as grab bars. What can I do in the bedroom?  Make sure that a bedside light is easy to reach.  Do not use oversized bedding that drapes onto the floor.  Have a firm chair that has side arms to use for getting dressed. What can  I do in the kitchen?  Clean up any spills right away.  If you need to reach for something above you, use a sturdy step stool that has a grab bar.  Keep electrical cables out of the way.  Do not use floor polish or wax that makes floors slippery. If you must use wax, make sure that it is non-skid floor wax. What can I do in the stairways?  Do not leave any items on the stairs.  Make sure that you have a light switch at the top of the stairs and the bottom of the stairs. Have them installed if you do not have them.  Make sure that there are handrails on both sides of the stairs. Fix handrails that are broken or loose. Make sure that handrails are as long as the stairways.  Install non-slip stair treads on all stairs in your home.  Avoid having throw rugs at the top or bottom of stairways, or secure the rugs with carpet tape to prevent them from moving.  Choose a carpet design that does not hide the edge of steps on the stairway.  Check any carpeting to make sure that it is firmly attached to the  stairs. Fix any carpet that is loose or worn. What can I do on the outside of my home?  Use bright outdoor lighting.  Regularly repair the edges of walkways and driveways and fix any cracks.  Remove high doorway thresholds.  Trim any shrubbery on the main path into your home.  Regularly check that handrails are securely fastened and in good repair. Both sides of any steps should have handrails.  Install guardrails along the edges of any raised decks or porches.  Clear walkways of debris and clutter, including tools and rocks.  Have leaves, snow, and ice cleared regularly.  Use sand or salt on walkways during winter months.  In the garage, clean up any spills right away, including grease or oil spills. What other actions can I take?  Wear closed-toe shoes that fit well and support your feet. Wear shoes that have rubber soles or low heels.  Use mobility aids as needed, such as  canes, walkers, scooters, and crutches.  Review your medicines with your health care provider. Some medicines can cause dizziness or changes in blood pressure, which increase your risk of falling. Talk with your health care provider about other ways that you can decrease your risk of falls. This may include working with a physical therapist or trainer to improve your strength, balance, and endurance. Where to find more information  Centers for Disease Control and Prevention, STEADI: WebmailGuide.co.za  [employer] on Aging: BrainJudge.co.uk Contact a health care provider if:  You are afraid of falling at home.  You feel weak, drowsy, or dizzy at home.  You fall at home. Summary  There are many simple things that you can do to make your home safe and to help prevent falls.  Ways to make your home safe include removing tripping hazards and installing grab bars in the bathroom.  Ask for help when making these changes in your home. This information is not intended to replace advice given to you by your health care provider. Make sure you discuss any questions you have with your health care provider. Document Revised: 08/26/2017 Document Reviewed: 04/28/2017 Elsevier Patient Education  [employer].    If you have lab work done today you will be contacted with your lab results within the next 2 weeks.  If you have not heard from [name] then please contact us. The fastest way to get your results is to register for My Chart.   IF you received an x-ray today, you will receive an invoice from [facility]. Please contact [facility] at [phone] with questions or concerns regarding your invoice.   IF you received labwork today, you will receive an invoice from [facility]. Please contact [facility] at [phone] with questions or concerns regarding your invoice.   Our billing staff will not be able to assist you with questions regarding bills from  these companies.  You will be contacted with the lab results as soon as they are available. The fastest way to get your results is to activate your My Chart account. Instructions are located on the last page of this paperwork. If you have not heard from [name] regarding the results in 2 weeks, please contact this office.         Signed, [name], MD [facility]

## 2020-08-29 LAB — LIPID PANEL
Chol/HDL Ratio: 2.6 ratio (ref 0.0–5.0)
Cholesterol, Total: 134 mg/dL (ref 100–199)
HDL: 51 mg/dL (ref >39)
LDL Chol Calc (NIH): 55 mg/dL (ref 0–99)
Triglycerides: 170 mg/dL — ABNORMAL HIGH (ref 0–149)
VLDL Cholesterol Cal: 28 mg/dL (ref 5–40)

## 2020-08-29 LAB — COMPREHENSIVE METABOLIC PANEL
ALT: 15 IU/L (ref 0–44)
AST: 20 IU/L (ref 0–40)
Albumin/Globulin Ratio: 2.1 (ref 1.2–2.2)
Albumin: 4.5 g/dL (ref 3.7–4.7)
Alkaline Phosphatase: 101 IU/L (ref 44–121)
BUN/Creatinine Ratio: 23 (ref 10–24)
BUN: 36 mg/dL — ABNORMAL HIGH (ref 8–27)
Bilirubin Total: 0.4 mg/dL (ref 0.0–1.2)
CO2: 20 mmol/L (ref 20–29)
Calcium: 9.9 mg/dL (ref 8.6–10.2)
Chloride: 106 mmol/L (ref 96–106)
Creatinine, Ser: 1.54 mg/dL — ABNORMAL HIGH (ref 0.76–1.27)
GFR calc Af Amer: 51 mL/min/{1.73_m2} — ABNORMAL LOW (ref >59)
GFR calc non Af Amer: 44 mL/min/{1.73_m2} — ABNORMAL LOW (ref >59)
Globulin, Total: 2.1 g/dL (ref 1.5–4.5)
Glucose: 99 mg/dL (ref 65–99)
Potassium: 4.5 mmol/L (ref 3.5–5.2)
Sodium: 140 mmol/L (ref 134–144)
Total Protein: 6.6 g/dL (ref 6.0–8.5)

## 2020-08-29 LAB — HEMOGLOBIN A1C
Est. average glucose Bld gHb Est-mCnc: 108 mg/dL
Hgb A1c MFr Bld: 5.4 % (ref 4.8–5.6)

## 2020-09-10 ENCOUNTER — Telehealth: Payer: Self-pay

## 2020-09-10 ENCOUNTER — Encounter: Payer: Self-pay | Admitting: Neurology

## 2020-09-10 ENCOUNTER — Ambulatory Visit (INDEPENDENT_AMBULATORY_CARE_PROVIDER_SITE_OTHER): Payer: Medicare Other | Admitting: Neurology

## 2020-09-10 ENCOUNTER — Other Ambulatory Visit: Payer: Self-pay

## 2020-09-10 VITALS — BP 126/72 | HR 57 | Ht 70.5 in | Wt 218.0 lb

## 2020-09-10 DIAGNOSIS — G4733 Obstructive sleep apnea (adult) (pediatric): Secondary | ICD-10-CM

## 2020-09-10 DIAGNOSIS — Z9989 Dependence on other enabling machines and devices: Secondary | ICD-10-CM | POA: Diagnosis not present

## 2020-09-10 DIAGNOSIS — I2510 Atherosclerotic heart disease of native coronary artery without angina pectoris: Secondary | ICD-10-CM | POA: Diagnosis not present

## 2020-09-10 NOTE — Patient Instructions (Addendum)
It was good to see you again today. I am glad to hear that your CPAP is generally going well. You are compliant with treatment.   Please continue using your CPAP regularly. While your insurance requires that you use CPAP at least 4 hours each night on 70% of the nights, I recommend, that you not skip any nights and use it throughout the night if you can. Getting used to CPAP and staying with the treatment long term does take time and patience and discipline. Untreated obstructive sleep apnea when it is moderate to severe can have an adverse impact on cardiovascular health and raise her risk for heart disease, arrhythmias, hypertension, congestive heart failure, stroke and diabetes. Untreated obstructive sleep apnea causes sleep disruption, nonrestorative sleep, and sleep deprivation. This can have an impact on your day to day functioning and cause daytime sleepiness and impairment of cognitive function, memory loss, mood disturbance, and problems focussing. Using CPAP regularly can improve these symptoms.  Keep up the good work! We can see you in 1 year, you can see one of our nurse practitioners as you are stable. I have renewed your CPAP supply order.

## 2020-09-10 NOTE — Progress Notes (Signed)
Order for cpap supplies sent to [facility] via community message. Confirmation received that the order transmitted was successful.  

## 2020-09-10 NOTE — Telephone Encounter (Signed)
Late Entry: I received a cpap order request from [facility] for this pt. Pt is overdue for his yearly cpap follow up appt. I called pt. He is agreeable to an appt on 09/10/20 at 8:30am with Dr. Rexene Alberts. Pt verbalized understanding of new appt date and time.

## 2020-09-10 NOTE — Progress Notes (Signed)
Subjective:    Patient ID: Joseph Maldonado. is a 74 y.o. male.  HPI     Interim history:   Joseph Maldonado is a 74 year old right-handed gentleman with an underlying medical history of arthritis, with status post multiple joint replacement surgeries, chronic kidney disease, depression, hypertension, reflux disease, chronic cough and overweight state, who presents for Follow-up consultation of his obstructive sleep apnea, established on CPAP therapy.  The patient is unaccompanied today and presents for his yearly checkup.  I last saw him in a virtual visit on 04/05/2019, at which time he was compliant with his CPAP and doing well.  He was advised to follow-up in 1 year.  Today, 09/10/2020: I reviewed his CPAP compliance data from 08/11/2020 through 09/09/2020, which is a total of 30 days, during which time he used his machine every night with percent use days greater than 4 hours at 80%, indicating very good compliance with an average usage of 5 hours and 9 minutes, residual AHI at goal at 3.8/h, leak on the high side with a 95th percentile at 24.6 L/min and a pressure of 12 cm with EPR of 1. He does have a tendency to take the mask off in the middle of the night and sometimes he just falls asleep without it on. His wife typically reminds him to put the mask on and he is overall doing well with CPAP, continues to benefit from it and is compliant with treatment, he is motivated to continue with it. He recently received new supplies. He was contemplating switching to a nasal mask but he is a mouth breather and prefers a full facemask. He has had some flareup in his hip pain from time to time. He had both hips and both knees replaced and he had multiple surgeries on his right knee. He did have a fall earlier in the year when his right knee gave out. He had some physical therapy which helps. He typically does not use a cane. He takes Tylenol twice daily. He has a history of kidney impairment and is scheduled to see  his nephrologist soon. He had a physical with primary care physician recently.   The patient's allergies, current medications, family history, past medical history, past social history, past surgical history and problem list were reviewed and updated as appropriate.    Previously:     I saw him on 10/05/2018, at which time we talked about his sleep study results, he was compliant with CPAP and doing well, he felt improved with regards to his daytime energy level and nighttime sleep consolidation and sleep quality.  He had undergone right total hip replacement in November 2018 and had to cancel an interim follow-up appointment because of his surgery and also his dog needed surgery.  He had lost weight over time and was able to keep it off.  He was using a fullface mask but had tried another type of mask in the interim.     I reviewed his CPAP compliance data from 03/05/2019 through 04/03/2019 which is a total of 30 days, during which time he used his machine every night with percent use days greater than 4 hours at 90%, indicating excellent compliance with an average usage of 6 hours and 53 minutes, residual AHI borderline at 4.8/h, leak on the higher end with a 95th percentile at 19.9 L/min on a pressure of 12 cm with EPR of 1.       I first met him on 04/06/2018 at the request of  his primary care physician, at which time he reported snoring and daytime somnolence. He was advised to proceed with a sleep study. He had a split-night sleep study on 05/22/2018. I went over his test results with him in detail today. Baseline sleep latency was delayed at 91.5 minutes and REM sleep was absent. He had a total AHI of 52.2 per hour, average oxygen saturation was 96%, nadir was 78%. He had no significant PLMS, no significant EKG changes. He was fitted with a large full facemask due to being a mouth breather and CPAP was titrated from 5 cm to 12 cm. On the final pressure his AHI was 0 per hour, with supine non-REM sleep  achieved an O2 nadir of 95%. Based on his test results I prescribed CPAP therapy for home use at a pressure of 12 cm.      I reviewed his CPAP compliance data from 09/04/2018 through 10/03/2018 which is a total of 30 days, during which time he used his CPAP every night with percent used days greater than 4 hours at 97%, indicating excellent compliance with an average usage of 7 hours and 17 minutes, residual AHI at goal at 3.8 per hour, leak on the higher end with the 95th percentile at 23.7 L/m on a pressure of 12 cm with EPR of 1.    04/06/2018: (He) reports snoring and excessive daytime somnolence. His wife is noted pauses in his breathing while asleep. He saw Dr. Jaynee Eagles last year after a fall. I reviewed your office note from 01/24/2018 as well as 03/13/2018. He recently saw pulmonology for his chronic cough. His Epworth sleepiness score is 3 out of 24, fatigue score is 32 out of 63. He denies a family history of sleep apnea. He reports a family history of brain aneurysm in his mother and brain cancer in his father. He would like to have a brain MRI done but cannot have contrast because of chronic kidney disease. For his chronic cough he was recently started on medication by pulmonology including anti-histamine and reflux medicine. His wife reports that there are some nights where he snores very little and has no apneas but there are some other nights where he has louder snoring and breathing pauses as well as gasping sounds. He has nocturia about 2-3 times per average night and denies morning headaches. 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 They have 2 grown children, a daughter and a son. Wife reports that his family and she have noticed changes in his behavior and low frustration tolerance recently. Of note, patient as his intolerance is provoked. He quit smoking in 1970. He drinks less than 2 beers per week and has not had any alcohol in several weeks. He drinks caffeine in the form of 2  large cups per day in the mornings. They have 2 dogs and 1 cat, dogs are often in the bedroom at night. His bedtime is between 10 and 11 PM, rise time around 7 or 7:30. He had a uvulectomy when he was in his 70s for snoring and he had a tonsillectomy as a child. He typically sleeps on his back because of bilateral shoulder pain. He had his left hip replaced as well as both knees.  His Past Medical History Is Significant For: Past Medical History:  Diagnosis Date  . Arthritis   . CAD (coronary artery disease)   . Chronic kidney disease   . Complication of anesthesia    reports " i  lose my blood pressure after anesthesia; the drops usually happens in recovery"   . Depression   . Hand pain    currently being evaluated by rheumatology for dx   . Hematuria   . Hypertension   . Rapid heart beat    rpeorts  " 2 months ago i had a rapid heart beat that lasted 45 min to an hour, ive experience this twice in the last t10 years but only seconds long" " i was evaluated by cardiology Dr Landry Mellow who did a 30 day monitor and ECHO and said i have occasional [PATS] ? , denies chest pain nor LOC  during these episodes; HR today is WDL   . Sleep apnea    CPAP     His Past Surgical History Is Significant For: Past Surgical History:  Procedure Laterality Date  . CATARACT EXTRACTION, BILATERAL    . HIP SURGERY Left 2012  . JOINT REPLACEMENT    . REPLACEMENT TOTAL KNEE Right    2001,2006,2009,2013  . REPLACEMENT TOTAL KNEE Left 2011  . TOTAL HIP ARTHROPLASTY Right 08/14/2018   Procedure: TOTAL HIP ARTHROPLASTY ANTERIOR APPROACH;  Surgeon: Frederik Pear, MD;  Location: [facility];  Service: Orthopedics;  Laterality: Right;    His Family History Is Significant For: Family History  Problem Relation Age of Onset  . Cancer Mother        throat  . Hypertension Mother   . Heart disease Mother   . Emphysema Mother   . Hypertension Father   . Cancer Father        Brain  . Hypertension Sister   . Hematuria  Sister   . Hematuria Brother     His Social History Is Significant For: Social History   Socioeconomic History  . Marital status: Married    Spouse name: Not on file  . Number of children: 2  . Years of education: Not on file  . Highest education level: Bachelor's degree (e.g., BA, AB, BS)  Occupational History  . Occupation: Retired  Tobacco Use  . Smoking status: Former Smoker    Packs/day: 0.50    Years: 3.00    Pack years: 1.50    Types: Cigarettes    Quit date: 1971    Years since quitting: 50.9  . Smokeless tobacco: Never Used  Vaping Use  . Vaping Use: Never used  Substance and Sexual Activity  . Alcohol use: Yes    Alcohol/week: 3.0 standard drinks    Types: 3 Cans of beer per week    Comment: max 3 beers/week with a meal.  . Drug use: No    Comment: former user of marijuana, quit in 1971  . Sexual activity: Yes  Other Topics Concern  . Not on file  Social History Narrative   Lives with his wife.   Education: College   Exercise: No   Drinks about 2 large coffees per day   Right handed      Spent many years in [place].  Moved to [place] to be near their children.  Daughter lives in [place].  Their son has lived in the [place] area with plans to move to [place] in summer 2019.   Social Determinants of Health   Financial Resource Strain: Not on file  Food Insecurity: Not on file  Transportation Needs: Not on file  Physical Activity: Not on file  Stress: Not on file  Social Connections: Not on file    His Allergies Are:  Allergies  Allergen  Reactions  . Nsaids     Affects creatinine  . Dilaudid [Hydromorphone Hcl] Other (See Comments)    Suicidal  . Sulfa Antibiotics     Other reaction(s): GI Intolerance  . Hydromorphone Anxiety    Suicidal   :   His Current Medications Are:  Outpatient Encounter Medications as of 09/10/2020  Medication Sig  . aspirin EC 81 MG tablet Take 81 mg by mouth every other day.   Marland Kitchen atenolol (TENORMIN) 25 MG tablet  TAKE 1 TABLET(25 MG) BY MOUTH DAILY  . atorvastatin (LIPITOR) 20 MG tablet TAKE 1 TABLET(20 MG) BY MOUTH DAILY  . buPROPion (WELLBUTRIN SR) 200 MG 12 hr tablet Ok to place on hold if needed.TAKE 2 TABLETS(400 MG) BY MOUTH DAILY  . losartan (COZAAR) 100 MG tablet Take 1 tablet (100 mg total) by mouth daily.  . pantoprazole (PROTONIX) 20 MG tablet Take 1 tablet (20 mg total) by mouth daily.   No facility-administered encounter medications on file as of 09/10/2020.  :  Review of Systems:  Out of a complete 14 point review of systems, all are reviewed and negative with the exception of these symptoms as listed below: Review of Systems  Neurological:       Pt presents today to discuss his cpap. Pt's cpap is going well. He needs a yearly visit for cpap supply orders.    Objective:  Neurological Exam  Physical Exam Physical Examination:   Vitals:   09/10/20 0831  BP: 126/72  Pulse: (!) 57    General Examination: The patient is a very pleasant 74 y.o. male in no acute distress. He appears well-developed and well-nourished and well groomed.   HEENT:Normocephalic, atraumatic, pupils are equal, round and reactive to light, extraocular tracking is well preserved, hearing is grossly intact, he has bilateral hearing aids in place. Face is symmetric with normal facial animation, speech is clear without dysarthria, hypophonia or voice tremor. No carotid bruits. Airway examination reveals moderate mouth dryness, adequate dental hygiene and mild airway crowding.  Chest:Clear to auscultation without wheezing, rhonchi or crackles noted.  Heart:S1+S2+0, regular and normal without murmurs, rubs or gallops noted.   Abdomen:Soft, non-tender and non-distended.  Extremities:There isnopitting edema in the distal lower extremities bilaterally.  Skin: Warm and dry without trophic changes noted.   Musculoskeletal: exam revealsno obvious change. Right leg shorter than left, right knee  swelling. Unequal hip height noted, slight upper body tilt to the right.  Neurologically:  Mental status: The patient is awake, alert and oriented in all 4 spheres.Hisimmediate and remote memory, attention, language skills and fund of knowledge are appropriate. There is no evidence of aphasia, agnosia, apraxia or anomia. Speech is clear with normal prosody and enunciation. Thought process is linear. Mood is normaland affect is blunted.  Cranial nerves II - XII are as described above under HEENT exam. In addition: shoulder shrug is normal with equal shoulder height noted. Motor exam: Normal bulk, strength and tone is noted. There is tremor. Fine motor skills are grossly intact. Cerebellar testing shows no dysmetria or intention tremor.  Sensory exam: intact to light touch. Gait, station and balance:Hestands with mild difficulty. Posture is age-appropriate with slight upper body tilt to the right, slight limp on the right. He has no walking aid.  Assessmentand Plan:   In summary,Joseph Maldonadois a very pleasant 48 year oldmalewith an underlying medical history of arthritis, with status post multiple joint replacement surgeries, chronic kidney disease, depression, hypertension, reflux disease, chronic cough and overweight state/borderline obesity,  who presents for follow-up consultation of his obstructive sleep apnea which was deemed to be in the severe range but split night sleep study testing in August 2019. He is well-established on CPAP of 12 cm via full facemask. He noted improvement in his daytime somnolence, sleep quality and sleep consolidation once he started treatment. He is motivated to continue with CPAP therapy. He is commended for his treatment adherence, I renewed his supply order for CPAP related supplies. He has turned the heated humidity of as he does better with cool humidity. He is advised to continue using his CPAP regularly, try not to skip any nights, try to keep the  mask on all night. He is advised to follow-up routinely with one of our nurse practitioners in 1 year and call us with any interim questions or concerns. I answered all his questions today and he was in agreement with the plan. I spent 20 minutes in total face-to-face time and in reviewing records during pre-charting, more than 50% of which was spent in counseling and coordination of care, reviewing test results, reviewing medications and treatment regimen and/or in discussing or reviewing the diagnosis of OSA, the prognosis and treatment options. Pertinent laboratory and imaging test results that were available during this visit with the patient were reviewed by me and considered in my medical decision making (see chart for details).

## 2020-09-12 DIAGNOSIS — L814 Other melanin hyperpigmentation: Secondary | ICD-10-CM | POA: Diagnosis not present

## 2020-09-12 DIAGNOSIS — L821 Other seborrheic keratosis: Secondary | ICD-10-CM | POA: Diagnosis not present

## 2020-09-12 DIAGNOSIS — D225 Melanocytic nevi of trunk: Secondary | ICD-10-CM | POA: Diagnosis not present

## 2020-09-12 DIAGNOSIS — D692 Other nonthrombocytopenic purpura: Secondary | ICD-10-CM | POA: Diagnosis not present

## 2020-09-12 DIAGNOSIS — Z85828 Personal history of other malignant neoplasm of skin: Secondary | ICD-10-CM | POA: Diagnosis not present

## 2020-09-12 DIAGNOSIS — L820 Inflamed seborrheic keratosis: Secondary | ICD-10-CM | POA: Diagnosis not present

## 2020-09-12 DIAGNOSIS — L570 Actinic keratosis: Secondary | ICD-10-CM | POA: Diagnosis not present

## 2020-10-06 DIAGNOSIS — E559 Vitamin D deficiency, unspecified: Secondary | ICD-10-CM | POA: Diagnosis not present

## 2020-10-06 DIAGNOSIS — N1832 Chronic kidney disease, stage 3b: Secondary | ICD-10-CM | POA: Diagnosis not present

## 2020-10-06 DIAGNOSIS — D631 Anemia in chronic kidney disease: Secondary | ICD-10-CM | POA: Diagnosis not present

## 2020-10-06 DIAGNOSIS — R319 Hematuria, unspecified: Secondary | ICD-10-CM | POA: Diagnosis not present

## 2020-10-06 DIAGNOSIS — I129 Hypertensive chronic kidney disease with stage 1 through stage 4 chronic kidney disease, or unspecified chronic kidney disease: Secondary | ICD-10-CM | POA: Diagnosis not present

## 2020-10-06 DIAGNOSIS — N189 Chronic kidney disease, unspecified: Secondary | ICD-10-CM | POA: Diagnosis not present

## 2020-10-06 DIAGNOSIS — E875 Hyperkalemia: Secondary | ICD-10-CM | POA: Diagnosis not present

## 2020-10-17 ENCOUNTER — Encounter: Payer: Self-pay | Admitting: Family Medicine

## 2020-11-18 ENCOUNTER — Ambulatory Visit
Admission: RE | Admit: 2020-11-18 | Discharge: 2020-11-18 | Disposition: A | Discharge status: Home / Self Care | Payer: Medicare Other | Source: Ambulatory Visit | Attending: Family Medicine | Admitting: Family Medicine

## 2020-11-18 DIAGNOSIS — I2510 Atherosclerotic heart disease of native coronary artery without angina pectoris: Secondary | ICD-10-CM | POA: Diagnosis not present

## 2020-11-18 DIAGNOSIS — I712 Thoracic aortic aneurysm, without rupture: Secondary | ICD-10-CM | POA: Diagnosis not present

## 2020-11-18 DIAGNOSIS — I700 Atherosclerosis of aorta: Secondary | ICD-10-CM | POA: Diagnosis not present

## 2020-11-18 DIAGNOSIS — R911 Solitary pulmonary nodule: Secondary | ICD-10-CM | POA: Diagnosis not present

## 2020-12-09 DIAGNOSIS — M19011 Primary osteoarthritis, right shoulder: Secondary | ICD-10-CM | POA: Diagnosis not present

## 2020-12-11 ENCOUNTER — Telehealth: Payer: Self-pay | Admitting: Internal Medicine

## 2020-12-11 ENCOUNTER — Encounter: Payer: Self-pay | Admitting: Internal Medicine

## 2020-12-11 NOTE — Telephone Encounter (Signed)
Called and spoke with patient. Has not had Covid 19 test in past 5 days. Having intermittent hoarseness, occasional productive cough with clear phlegm and clearing throat. Patient states this has been going on for awhile. Former smoker and patient states mother had throat cancer and is concerned.   Pharmacy is [facility]  Dr Melvyn Novas please advise.

## 2020-12-11 NOTE — Telephone Encounter (Signed)
Please see previous encounter

## 2020-12-11 NOTE — Telephone Encounter (Signed)
Can try a zpak but really needs to  refer back to ENT at [facility]

## 2020-12-11 NOTE — Telephone Encounter (Signed)
Called and went over Dr Gustavus Bryant recommendations. Patient expressed full understanding and did not feel that a zpak is needed at this time and stated he would reach out to [facility]. Will route to Dr Melvyn Novas as Juluis Rainier. Nothing further needed at this time.

## 2020-12-11 NOTE — Telephone Encounter (Signed)
Attempted to call patient, left voicemail to please call office.

## 2020-12-16 DIAGNOSIS — Z87891 Personal history of nicotine dependence: Secondary | ICD-10-CM | POA: Diagnosis not present

## 2020-12-16 DIAGNOSIS — R490 Dysphonia: Secondary | ICD-10-CM | POA: Diagnosis not present

## 2020-12-16 DIAGNOSIS — J383 Other diseases of vocal cords: Secondary | ICD-10-CM | POA: Diagnosis not present

## 2020-12-16 DIAGNOSIS — R053 Chronic cough: Secondary | ICD-10-CM | POA: Diagnosis not present

## 2020-12-16 DIAGNOSIS — J387 Other diseases of larynx: Secondary | ICD-10-CM | POA: Diagnosis not present

## 2020-12-23 ENCOUNTER — Telehealth: Payer: Self-pay | Admitting: Neurology

## 2020-12-23 NOTE — Telephone Encounter (Signed)
Pt called needing to speak to the RN regarding the numbers on his cpap machine and the number of episodes he is apparently having according to his wife. Please advise.

## 2020-12-23 NOTE — Telephone Encounter (Signed)
I have printed the report from resmed for MD to review.  Will f/u with pt once MD reviews and recommendation is provided.

## 2020-12-24 ENCOUNTER — Telehealth: Payer: Self-pay | Admitting: Neurology

## 2020-12-24 DIAGNOSIS — G4733 Obstructive sleep apnea (adult) (pediatric): Secondary | ICD-10-CM

## 2020-12-24 NOTE — Telephone Encounter (Signed)
I reviewed patient's CPAP compliance data for the past 30 days from 11/23/2020 through 12/22/2020.  Patient was compliant with treatment, percent use days greater than 4 hours was 87%, average usage for days on treatment 6 hours and 2 minutes, residual AHI borderline at 6.1/h, leak on the higher side with a 95th percentile at 33.3 L/min on a pressure of 12 cm.  Please call patient back and advise him that his residual numbers look good, he does have a borderline apnea/hypopnea score but I would not favor increasing the pressure especially since he does tend to have a leak from the mask.  Lately, in the past approximately 12 days the leak has improved.  I would recommend staying the course and continuing with the current settings and the current mask.  If he notices a consistent leak from the mask, he can talk to his DME provider about a mask refit appointment.  Other than that, we can follow him as scheduled for his next appointment.

## 2020-12-25 ENCOUNTER — Telehealth: Payer: Self-pay | Admitting: Neurology

## 2020-12-25 NOTE — Telephone Encounter (Signed)
I called the pt and we discussed message. Pt is still concerned about the numbers he is seeing on his machine. Pt reports 1 night his machine reflected he was having 19 events/ in an hour and and another night 16.  Pt reports he wants the doctor to look at these nights specifically and see if recommendation on the pressure needs to be made. Pt also reports his wife as witness some apnea like events while on the cpap.  Pt is going to call back with the specific dates of the apnea events and call back.

## 2020-12-25 NOTE — Telephone Encounter (Signed)
FYI: Pt called, with dates, last 5 days of March the number of episodes per hour was in the double digits. Ranges from 10 to 16.

## 2020-12-25 NOTE — Telephone Encounter (Signed)
Pt called, I have had a increase in episodes (stop breathing) I have had 19 episodes in 30 mins and my mask is leaking.  Would like a call from the nurse.  Informed to call his DME company. Pt stated, I will call them today, but I'm not sure its the machine.

## 2020-12-25 NOTE — Telephone Encounter (Signed)
I discussed again with Dr. Rexene Alberts about pt's concerns. She was agreeable to increasing the pressure of CPAP to 13.. Order has been placed.  Pt advised of new recommendation and was agreeable.  I changed CPAP pressure to reflect 13 as per VO.

## 2020-12-25 NOTE — Telephone Encounter (Signed)
Dr. Rexene Alberts has looked over this information/advised on. See separate telephone note from 12/25/20.

## 2020-12-25 NOTE — Addendum Note (Signed)
Addended by: Verlin Grills on: 12/25/2020 03:56 PM   Modules accepted: Orders

## 2020-12-30 DIAGNOSIS — Z23 Encounter for immunization: Secondary | ICD-10-CM | POA: Diagnosis not present

## 2021-01-12 ENCOUNTER — Ambulatory Visit: Payer: Medicare Other | Admitting: Neurology

## 2021-02-02 ENCOUNTER — Encounter: Payer: Self-pay | Admitting: Neurology

## 2021-02-04 ENCOUNTER — Encounter: Payer: Self-pay | Admitting: Neurology

## 2021-02-04 ENCOUNTER — Ambulatory Visit (INDEPENDENT_AMBULATORY_CARE_PROVIDER_SITE_OTHER): Payer: Medicare Other | Admitting: Neurology

## 2021-02-04 VITALS — BP 133/72 | HR 45 | Ht 71.0 in | Wt 213.0 lb

## 2021-02-04 DIAGNOSIS — G4733 Obstructive sleep apnea (adult) (pediatric): Secondary | ICD-10-CM | POA: Diagnosis not present

## 2021-02-04 DIAGNOSIS — Z9989 Dependence on other enabling machines and devices: Secondary | ICD-10-CM | POA: Diagnosis not present

## 2021-02-04 NOTE — Patient Instructions (Addendum)
It was nice to see you again today.  I am sorry to hear that you are having trouble with your shoulder.  You are compliant with your CPAP and your numbers are looking better.  Let's maintain you on a pressure of 13 cm with your current mask.  Your air seal has indeed improved in the past approximately 10 days.  Keep up the good work!  Please continue using your CPAP regularly. While your insurance requires that you use CPAP at least 4 hours each night on 70% of the nights, I recommend, that you not skip any nights and use it throughout the night if you can. Getting used to CPAP and staying with the treatment long term does take time and patience and discipline. Untreated obstructive sleep apnea when it is moderate to severe can have an adverse impact on cardiovascular health and raise her risk for heart disease, arrhythmias, hypertension, congestive heart failure, stroke and diabetes. Untreated obstructive sleep apnea causes sleep disruption, nonrestorative sleep, and sleep deprivation. This can have an impact on your day to day functioning and cause daytime sleepiness and impairment of cognitive function, memory loss, mood disturbance, and problems focussing. Using CPAP regularly can improve these symptoms.  From the sleep apnea standpoint you can follow-up in 1 year, you can see Amy or Jinny Blossom, one of our nurse practitioners routinely.  Follow-ups with his cardiologist for his

## 2021-02-04 NOTE — Progress Notes (Signed)
Subjective:    Patient ID: Joseph Maldonado. is a 75 y.o. male.  HPI     Interim history:     Joseph Maldonado is a 75 year old right-handed gentleman with an underlying medical history of arthritis, with status post multiple joint replacement surgeries, chronic kidney disease, depression, hypertension, reflux disease, chronic cough and overweight state, who presents for follow-up consultation of his obstructive sleep apnea, established on CPAP therapy.  The patient is unaccompanied today. I saw him on 09/10/20, at which time he was compliant with his CPAP and residual AHI was at goal.  He was advised to follow-up in 1 year.  He called in the interim in late March reporting that his wife had noted increased apneas while he was on CPAP.  A compliance review indicated a borderline residual AHI of 6.1/h.  He was advised to continue with CPAP of 12 cm but requested additional review as he had noticed increase in events on his machine, I therefore increased his set pressure to 13 cm.  Today, 02/04/2021: I reviewed his CPAP compliance data from 01/04/2021 through 02/02/2021, which is a total of 30 days, during which time he used his machine 29 days with percent use days greater than 4 hours at 83%, indicating very good compliance with an average usage of 6 hours and 31 minutes, residual AHI slightly borderline at 5.7/h, leak on the higher side with a 95th percentile at 30.9 mL/min, pressure of 13 cm with EPR of 1.  Leak numbers improved and that has 10 days.  He reports doing better recently.  He tried an AirTouch full facemask which did not work for him.  He switched back to his previous fullface mask and headgear and things are looking better, events are still fluctuating but have come down since the increase in pressure.  His wife from time to time notices an apnea and no significant snoring.  He is quite pleased with how things are going, however, has discomfort at night, some nights worse than others because of  his shoulder arthritis.  He will eventually need a shoulder replacement but would like to hold off for now.  His range of motion is still fairly good he states, it is just painful.  He takes Tylenol 1 g twice daily on a day-to-day basis.  He has regular follow-up with his cardiologist for his aortic aneurysm.  He typically gets an echocardiogram every other year now.  He gets regular labs through his primary care physician.  He does have chronic kidney impairment.   The patient's allergies, current medications, family history, past medical history, past social history, past surgical history and problem list were reviewed and updated as appropriate.    Previously:  I saw him in a virtual visit on 04/05/2019, at which time he was compliant with his CPAP and doing well.  He was advised to follow-up in 1 year.   I reviewed his CPAP compliance data from 08/11/2020 through 09/09/2020, which is a total of 30 days, during which time he used his machine every night with percent use days greater than 4 hours at 80%, indicating very good compliance with an average usage of 5 hours and 9 minutes, residual AHI at goal at 3.8/h, leak on the high side with a 95th percentile at 24.6 L/min and a pressure of 12 cm with EPR of 1.    I saw him on 10/05/2018, at which time we talked about his sleep study results, he was compliant with CPAP and doing  well, he felt improved with regards to his daytime energy level and nighttime sleep consolidation and sleep quality.  He had undergone right total hip replacement in November 2018 and had to cancel an interim follow-up appointment because of his surgery and also his dog needed surgery.  He had lost weight over time and was able to keep it off.  He was using a fullface mask but had tried another type of mask in the interim.     I reviewed his CPAP compliance data from 03/05/2019 through 04/03/2019 which is a total of 30 days, during which time he used his machine every night with percent  use days greater than 4 hours at 90%, indicating excellent compliance with an average usage of 6 hours and 53 minutes, residual AHI borderline at 4.8/h, leak on the higher end with a 95th percentile at 19.9 L/min on a pressure of 12 cm with EPR of 1.       I first met him on 04/06/2018 at the request of his primary care physician, at which time he reported snoring and daytime somnolence. He was advised to proceed with a sleep study. He had a split-night sleep study on 05/22/2018. I went over his test results with him in detail today. Baseline sleep latency was delayed at 91.5 minutes and REM sleep was absent. He had a total AHI of 52.2 per hour, average oxygen saturation was 96%, nadir was 78%. He had no significant PLMS, no significant EKG changes. He was fitted with a large full facemask due to being a mouth breather and CPAP was titrated from 5 cm to 12 cm. On the final pressure his AHI was 0 per hour, with supine non-REM sleep achieved an O2 nadir of 95%. Based on his test results I prescribed CPAP therapy for home use at a pressure of 12 cm.      I reviewed his CPAP compliance data from 09/04/2018 through 10/03/2018 which is a total of 30 days, during which time he used his CPAP every night with percent used days greater than 4 hours at 97%, indicating excellent compliance with an average usage of 7 hours and 17 minutes, residual AHI at goal at 3.8 per hour, leak on the higher end with the 95th percentile at 23.7 L/m on a pressure of 12 cm with EPR of 1.    04/06/2018: (He) reports snoring and excessive daytime somnolence. His wife is noted pauses in his breathing while asleep. He saw Dr. Jaynee Eagles last year after a fall. I reviewed your office note from 01/24/2018 as well as 03/13/2018. He recently saw pulmonology for his chronic cough. His Epworth sleepiness score is 3 out of 24, fatigue score is 32 out of 63. He denies a family history of sleep apnea. He reports a family history of brain aneurysm in his  mother and brain cancer in his father. He would like to have a brain MRI done but cannot have contrast because of chronic kidney disease. For his chronic cough he was recently started on medication by pulmonology including anti-histamine and reflux medicine. His wife reports that there are some nights where he snores very little and has no apneas but there are some other nights where he has louder snoring and breathing pauses as well as gasping sounds. He has nocturia about 2-3 times per average night and denies morning headaches. 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 They have 2 grown children, a daughter and a son. Wife reports  that his family and she have noticed changes in his behavior and low frustration tolerance recently. Of note, patient as his intolerance is provoked. He quit smoking in 1970. He drinks less than 2 beers per week and has not had any alcohol in several weeks. He drinks caffeine in the form of 2 large cups per day in the mornings. They have 2 dogs and 1 cat, dogs are often in the bedroom at night. His bedtime is between 10 and 11 PM, rise time around 7 or 7:30. He had a uvulectomy when he was in his 84s for snoring and he had a tonsillectomy as a child. He typically sleeps on his back because of bilateral shoulder pain. He had his left hip replaced as well as both knees.  His Past Medical History Is Significant For: Past Medical History:  Diagnosis Date  . Arthritis   . CAD (coronary artery disease)   . Chronic kidney disease   . Complication of anesthesia    reports " i lose my blood pressure after anesthesia; the drops usually happens in recovery"   . Depression   . Hand pain    currently being evaluated by rheumatology for dx   . Hematuria   . Hypertension   . Rapid heart beat    rpeorts  " 2 months ago i had a rapid heart beat that lasted 45 min to an hour, ive experience this twice in the last t10 years but only seconds long" " i was evaluated by  cardiology Dr Landry Mellow who did a 30 day monitor and ECHO and said i have occasional [PATS] ? , denies chest pain nor LOC  during these episodes; HR today is WDL   . Sleep apnea    CPAP     His Past Surgical History Is Significant For: Past Surgical History:  Procedure Laterality Date  . CATARACT EXTRACTION, BILATERAL    . HIP SURGERY Left 2012  . JOINT REPLACEMENT    . REPLACEMENT TOTAL KNEE Right    2001,2006,2009,2013  . REPLACEMENT TOTAL KNEE Left 2011  . TOTAL HIP ARTHROPLASTY Right 08/14/2018   Procedure: TOTAL HIP ARTHROPLASTY ANTERIOR APPROACH;  Surgeon: Frederik Pear, MD;  Location: [facility];  Service: Orthopedics;  Laterality: Right;    His Family History Is Significant For: Family History  Problem Relation Age of Onset  . Cancer Mother        throat  . Hypertension Mother   . Heart disease Mother   . Emphysema Mother   . Hypertension Father   . Cancer Father        Brain  . Hypertension Sister   . Hematuria Sister   . Hematuria Brother     His Social History Is Significant For: Social History   Socioeconomic History  . Marital status: Married    Spouse name: Not on file  . Number of children: 2  . Years of education: Not on file  . Highest education level: Bachelor's degree (e.g., BA, AB, BS)  Occupational History  . Occupation: Retired  Tobacco Use  . Smoking status: Former Smoker    Packs/day: 0.50    Years: 3.00    Pack years: 1.50    Types: Cigarettes    Quit date: 1971    Years since quitting: 51.3  . Smokeless tobacco: Never Used  Vaping Use  . Vaping Use: Never used  Substance and Sexual Activity  . Alcohol use: Yes    Alcohol/week: 3.0 standard drinks  Types: 3 Cans of beer per week    Comment: max 3 beers/week with a meal.  . Drug use: No    Comment: former user of marijuana, quit in 1971  . Sexual activity: Yes  Other Topics Concern  . Not on file  Social History Narrative   Lives with his wife.   Education: College   Exercise:  No   Drinks about 2 large coffees per day   Right handed      Spent many years in [place].  Moved to [place] to be near their children.  Daughter lives in [place].  Their son has lived in the [place] area with plans to move to [place] in summer 2019.   Social Determinants of Health   Financial Resource Strain: Not on file  Food Insecurity: Not on file  Transportation Needs: Not on file  Physical Activity: Not on file  Stress: Not on file  Social Connections: Not on file    His Allergies Are:  Allergies  Allergen Reactions  . Nsaids     Affects creatinine  . Dilaudid [Hydromorphone Hcl] Other (See Comments)    Suicidal  . Sulfa Antibiotics     Other reaction(s): GI Intolerance  . Hydromorphone Anxiety    Suicidal   :   His Current Medications Are:  Outpatient Encounter Medications as of 02/04/2021  Medication Sig  . aspirin EC 81 MG tablet Take 81 mg by mouth every other day.   Marland Kitchen atenolol (TENORMIN) 25 MG tablet TAKE 1 TABLET(25 MG) BY MOUTH DAILY  . atorvastatin (LIPITOR) 20 MG tablet TAKE 1 TABLET(20 MG) BY MOUTH DAILY  . buPROPion (WELLBUTRIN SR) 200 MG 12 hr tablet Ok to place on hold if needed.TAKE 2 TABLETS(400 MG) BY MOUTH DAILY  . losartan (COZAAR) 100 MG tablet Take 1 tablet (100 mg total) by mouth daily.  . pantoprazole (PROTONIX) 20 MG tablet Take 1 tablet (20 mg total) by mouth daily.   No facility-administered encounter medications on file as of 02/04/2021.  :  Review of Systems:  Out of a complete 14 point review of systems, all are reviewed and negative with the exception of these symptoms as listed below: Review of Systems  Neurological:       Pt presents as a CPAP follow up. He originally scheduled this apt when he was having a increase number in events each night. Since then he has made some changes and the events have gotten better. He had a injury to his shoulder and he was being treated for that and he had a mask leak. He thinks the combination of both  of those were causing disruption with sleep. He has since made a mask change and states it is sealing better and thinks things are better. DME [facility])     Objective:  Neurological Exam  Physical Exam Physical Examination:   Vitals:   02/04/21 0716  BP: 133/72  Pulse: (!) 45   General Examination: The patient is a very pleasant 75 y.o. male in no acute distress. He appears well-developed and well-nourished and well groomed.   HEENT:Normocephalic, atraumatic, pupils are equal, round and reactive to light, extraocular tracking is well preserved, hearing is grossly intact, he has bilateral hearing aids in place. Face is symmetric with normal facial animation, speech is clear without dysarthria, hypophonia or voice tremor. No carotid bruits. Airway examination reveals mild mouth dryness, adequate dental hygiene and mild airway crowding.  Chest:Clear to auscultation without wheezing, rhonchi or crackles  noted.  Heart:S1+S2+0, regular and normal without murmurs, rubs or gallops noted.   Abdomen:Soft, non-tender and non-distended.  Extremities:There isnopitting edema in the distal lower extremities bilaterally.  Skin: Warm and dry without trophic changes noted.   Musculoskeletal: exam revealsno obvious change. Right leg shorter than left, right knee swelling. Unequal hip height noted, slight upper body tilt to the right, all stable. Mild decrease in ROM in R shoulder.  Neurologically:  Mental status: The patient is awake, alert and oriented in all 4 spheres.Hisimmediate and remote memory, attention, language skills and fund of knowledge are appropriate. There is no evidence of aphasia, agnosia, apraxia or anomia. Speech is clear with normal prosody and enunciation. Thought process is linear. Mood is normaland affect is blunted.  Cranial nerves II - XII are as described above under HEENT exam.  Motor exam: Normal bulk, strength and tone is noted. There is  tremor. Fine motor skills are grossly intact. Cerebellar testing shows no dysmetria or intention tremor.  Sensory exam: intact to light touch. Gait, station and balance:Hestands with mild difficulty. Posture is age-appropriate with slight upper body tilt to the right, slight limp on the right. He has no walking aid.  Assessmentand Plan:   In summary,Joseph Maldonadois a very pleasant 62 year oldmalewith an underlying medical history of arthritis, with status post multiple joint replacement surgeries, chronic kidney disease, depression, hypertension, reflux disease, chronic cough and overweight state/borderline obesity, whopresents for follow-up consultation of his obstructive sleep apnea which was deemed to be in the severe range by split night sleep study testing in August 2019. He is well-established on CPAP.  We recently increased the pressure from 12 cm to 13 cm in March 2022 after he noted increase in events.  He has done well since then after trying a different style of mask but is back on his previous full facemask.  He is up-to-date with his supplies and compliant with treatment, is feeling better.  He still has trouble with his right shoulder, may eventually go through with shoulder replacement but is trying to get by with as needed use of Tylenol.  He is cautioned regarding the day-to-day use of Tylenol, liver numbers were good but he does have chronic kidney disease as well.  He is in regular checkup with his primary care physician and his cardiologist.  He is advised to follow-up routinely in sleep clinic to see one of our nurse practitioners in 1 year.  He is commended for his treatment adherence. I answered all his questions today and he was in agreement.   I spent 22 minutes in total face-to-face time and in reviewing records during pre-charting, more than 50% of which was spent in counseling and coordination of care, reviewing test results, reviewing medications and treatment  regimen and/or in discussing or reviewing the diagnosis of OSA, the prognosis and treatment options. Pertinent laboratory and imaging test results that were available during this visit with the patient were reviewed by me and considered in my medical decision making (see chart for details).

## 2021-02-11 DIAGNOSIS — M79661 Pain in right lower leg: Secondary | ICD-10-CM | POA: Diagnosis not present

## 2021-02-17 DIAGNOSIS — M25561 Pain in right knee: Secondary | ICD-10-CM | POA: Diagnosis not present

## 2021-02-21 ENCOUNTER — Other Ambulatory Visit: Payer: Self-pay | Admitting: Family Medicine

## 2021-02-21 DIAGNOSIS — G4700 Insomnia, unspecified: Secondary | ICD-10-CM

## 2021-02-24 NOTE — Telephone Encounter (Signed)
Patient is requesting a refill of the following medications: Requested Prescriptions   Pending Prescriptions Disp Refills   buPROPion (WELLBUTRIN SR) 200 MG 12 hr tablet [Pharmacy Med Name: BUPROPION SR 200MG  TABLETS (12HR)] 180 tablet 1    Sig: TAKE 2 TABLETS BY MOUTH EVERY DAY    Date of patient request: 02/21/21 Last office visit: 08/28/20 Date of last refill: 08/28/20 Last refill amount: 180 R1 Follow up time period per chart: 6 month

## 2021-02-26 ENCOUNTER — Ambulatory Visit (INDEPENDENT_AMBULATORY_CARE_PROVIDER_SITE_OTHER): Payer: Medicare Other | Admitting: Family Medicine

## 2021-02-26 ENCOUNTER — Other Ambulatory Visit: Payer: Self-pay

## 2021-02-26 ENCOUNTER — Encounter: Payer: Self-pay | Admitting: Family Medicine

## 2021-02-26 VITALS — BP 138/76 | HR 55 | Temp 98.1°F | Resp 16 | Ht 71.0 in | Wt 212.2 lb

## 2021-02-26 DIAGNOSIS — E785 Hyperlipidemia, unspecified: Secondary | ICD-10-CM

## 2021-02-26 DIAGNOSIS — N1831 Chronic kidney disease, stage 3a: Secondary | ICD-10-CM | POA: Diagnosis not present

## 2021-02-26 DIAGNOSIS — I10 Essential (primary) hypertension: Secondary | ICD-10-CM

## 2021-02-26 DIAGNOSIS — R42 Dizziness and giddiness: Secondary | ICD-10-CM | POA: Diagnosis not present

## 2021-02-26 DIAGNOSIS — I712 Thoracic aortic aneurysm, without rupture: Secondary | ICD-10-CM

## 2021-02-26 DIAGNOSIS — F32A Depression, unspecified: Secondary | ICD-10-CM | POA: Diagnosis not present

## 2021-02-26 DIAGNOSIS — I7121 Aneurysm of the ascending aorta, without rupture: Secondary | ICD-10-CM

## 2021-02-26 MED ORDER — PANTOPRAZOLE SODIUM 20 MG PO TBEC: 20.0000 mg | DELAYED_RELEASE_TABLET | Freq: Every day | ORAL | 2 refills | Status: DC

## 2021-02-26 MED ORDER — ATENOLOL 25 MG PO TABS: ORAL_TABLET | ORAL | 2 refills | Status: DC

## 2021-02-26 MED ORDER — ATORVASTATIN CALCIUM 20 MG PO TABS
ORAL_TABLET | ORAL | 0 refills | Status: DC
  Filled 2021-12-03: qty 90, 90d supply, fill #0

## 2021-02-26 NOTE — Patient Instructions (Signed)
Fleeting dizziness in the morning may have been related to orthostatic hypotension or slight drop in pressure when you first get up.  Make sure to sit on the edge of the bed first and get up slowly after lying down.  If the symptoms return or worsen, please  follow-up and we can discuss further. No other medication changes at this time.  Keep follow-up with specialist as planned.  Thank you for coming in today

## 2021-02-26 NOTE — Progress Notes (Signed)
Subjective:  Patient ID: Joseph Parrot., male    DOB: Jun 25, 1946  Age: 75 y.o. MRN: [id number]  CC:  Chief Complaint  Patient presents with  . Hypertension    Pt doing well denies physical symptoms  . Hyperlipidemia    Pt due for repeat lab work     HPI Joseph Parrot. presents for   Hypertension: With history of OSA on CPAP, followed by Dr. Rexene Alberts, with recent visit. Sleeping well and compliant with CPAP. feels rested. , CAD and ascending aortic aneurysm followed by Dr. Virgina Jock.  Repeat echo in August for aneurysm planned.  Nonobstructive CAD, continued on aspirin and statin.  Aspirin every other day.   History of CKD, due to hypertensive nephropathy and prior use of NSAIDs.  CKD stage IIIa.   Nephrologist Dr. Carolin Sicks.  Appointment in January, no med changes, creatinine 1.35 at that time with EGFR 52. Atenolol 25 mg daily, losartan 100 mg daily No nsaids.  Home readings: 120/70's, no new side effects.  Had one episode of dizziness getting out of bed 2-3 weeks ago - not since.vision ok, but when covering one eye - things jumping up and down.  No focal weakness. Improves with sitting on edge of bed. Resolved in 10s. Noted for a few weeks with sitting up from lying down.  BP Readings from Last 3 Encounters:  02/26/21 138/76  02/04/21 133/72  09/10/20 126/72   Lab Results  Component Value Date   CREATININE 1.54 (H) 08/28/2020   Hyperlipidemia: Lipitor 20 mg daily, no new myalgias.  Lab Results  Component Value Date   CHOL 134 08/28/2020   HDL 51 08/28/2020   LDLCALC 55 08/28/2020   TRIG 170 (H) 08/28/2020   CHOLHDL 2.6 08/28/2020   Lab Results  Component Value Date   ALT 15 08/28/2020   AST 20 08/28/2020   ALKPHOS 101 08/28/2020   BILITOT 0.4 08/28/2020   Depression: Stable control with Wellbutrin 400 mg daily. Still managing well.   Depression screen [facility] 2/9 08/28/2020 06/23/2020 05/16/2020 02/27/2020 01/28/2020  Decreased Interest 0 0 0 0 0  Down, Depressed,  Hopeless 0 0 0 0 0  PHQ - 2 Score 0 0 0 0 0      History Patient Active Problem List   Diagnosis Date Noted  . OSA (obstructive sleep apnea) 06/01/2019  . Aneurysm of ascending aorta (HCC) 05/18/2019  . Coronary artery disease involving native coronary artery of native heart without angina pectoris 05/18/2019  . Essential hypertension 05/18/2019  . History of total hip arthroplasty, right 08/14/2018  . Osteoarthritis of right hip 08/12/2018  . Palpitations 01/29/2018  . SCC (squamous cell carcinoma) 01/28/2018  . Solitary pulmonary nodule 10/03/2016  . Cough 09/29/2016  . Seborrheic keratosis 02/26/2014  . Rotator cuff tear arthropathy of right shoulder 01/10/2014  . 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 S/P total knee arthroplasty 10/05/2011  . 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 OA (osteoarthritis) of knee 03/01/2011  . CKD (chronic kidney disease) 05/13/2010   Past Medical History:  Diagnosis Date  . Arthritis   . CAD (coronary artery disease)   . Chronic kidney disease   . Complication of anesthesia    reports " i lose my blood pressure after anesthesia; the drops usually happens in recovery"   . Depression   . Hand pain    currently being evaluated by rheumatology for dx   . Hematuria   . Hypertension   .  Rapid heart beat    rpeorts  " 2 months ago i had a rapid heart beat that lasted 45 min to an hour, ive experience this twice in the last t10 years but only seconds long" " i was evaluated by cardiology Dr Landry Mellow who did a 30 day monitor and ECHO and said i have occasional [PATS] ? , denies chest pain nor LOC  during these episodes; HR today is WDL   . Sleep apnea    CPAP    Past Surgical History:  Procedure Laterality Date  . CATARACT EXTRACTION, BILATERAL    . HIP SURGERY Left 2012  . JOINT REPLACEMENT    . REPLACEMENT TOTAL KNEE Right    2001,2006,2009,2013  . REPLACEMENT TOTAL KNEE Left 2011  . TOTAL HIP ARTHROPLASTY Right  08/14/2018   Procedure: TOTAL HIP ARTHROPLASTY ANTERIOR APPROACH;  Surgeon: Frederik Pear, MD;  Location: [facility];  Service: Orthopedics;  Laterality: Right;   Allergies  Allergen Reactions  . Nsaids     Affects creatinine  . Dilaudid [Hydromorphone Hcl] Other (See Comments)    Suicidal  . Sulfa Antibiotics     Other reaction(s): GI Intolerance  . Hydromorphone Anxiety    Suicidal    Prior to Admission medications   Medication Sig Start Date End Date Taking? Authorizing Provider  aspirin EC 81 MG tablet Take 81 mg by mouth every other day.    Yes [provider]  atenolol (TENORMIN) 25 MG tablet TAKE 1 TABLET(25 MG) BY MOUTH DAILY 08/28/20  Yes Wendie Agreste, MD  atorvastatin (LIPITOR) 20 MG tablet TAKE 1 TABLET(20 MG) BY MOUTH DAILY 08/28/20  Yes Wendie Agreste, MD  buPROPion [facility] SR) 200 MG 12 hr tablet TAKE 2 TABLETS BY MOUTH EVERY DAY 02/24/21  Yes Wendie Agreste, MD  losartan (COZAAR) 100 MG tablet Take 1 tablet (100 mg total) by mouth daily. 08/28/20  Yes Wendie Agreste, MD  pantoprazole (PROTONIX) 20 MG tablet Take 1 tablet (20 mg total) by mouth daily. 08/28/20  Yes Wendie Agreste, MD   Social History   Socioeconomic History  . Marital status: Married    Spouse name: Not on file  . Number of children: 2  . Years of education: Not on file  . Highest education level: Bachelor's degree (e.g., BA, AB, BS)  Occupational History  . Occupation: Retired  Tobacco Use  . Smoking status: Former Smoker    Packs/day: 0.50    Years: 3.00    Pack years: 1.50    Types: Cigarettes    Quit date: 1971    Years since quitting: 51.4  . Smokeless tobacco: Never Used  Vaping Use  . Vaping Use: Never used  Substance and Sexual Activity  . Alcohol use: Yes    Alcohol/week: 3.0 standard drinks    Types: 3 Cans of beer per week    Comment: max 3 beers/week with a meal.  . Drug use: No    Comment: former user of marijuana, quit in 1971  . Sexual activity: Yes   Other Topics Concern  . Not on file  Social History Narrative   Lives with his wife.   Education: College   Exercise: No   Drinks about 2 large coffees per day   Right handed      Spent many years in [place].  Moved to [place] to be near their children.  Daughter lives in [place].  Their son has lived in the [place] area with plans to  move to [facility] in summer 2019.   Social Determinants of Health   Financial Resource Strain: Not on file  Food Insecurity: Not on file  Transportation Needs: Not on file  Physical Activity: Not on file  Stress: Not on file  Social Connections: Not on file  Intimate Partner Violence: Not on file    Review of Systems  Constitutional: Negative for fatigue and unexpected weight change.  Eyes: Negative for visual disturbance.  Respiratory: Negative for cough, chest tightness and shortness of breath.   Cardiovascular: Negative for chest pain, palpitations and leg swelling.  Gastrointestinal: Negative for abdominal pain and blood in stool.  Neurological: Positive for dizziness (fleeting sx's above. ). Negative for light-headedness and headaches.     Objective:   Vitals:   02/26/21 0832  BP: 138/76  Pulse: (!) 55  Resp: 16  Temp: 98.1 F (36.7 C)  TempSrc: Temporal  SpO2: 98%  Weight: 212 lb 3.2 oz (96.3 kg)  Height: _0  (1.803 m)     Physical Exam Vitals reviewed.  Constitutional:      Appearance: He is well-developed.  HENT:     Head: Normocephalic and atraumatic.  Eyes:     Pupils: Pupils are equal, round, and reactive to light.  Neck:     Vascular: No carotid bruit or JVD.  Cardiovascular:     Rate and Rhythm: Normal rate and regular rhythm.     Heart sounds: Normal heart sounds. No murmur heard.   Pulmonary:     Effort: Pulmonary effort is normal.     Breath sounds: Normal breath sounds. No rales.  Skin:    General: Skin is warm and dry.  Neurological:     Mental Status: He is alert and oriented to person, place,  and time.        Assessment & Plan:  Ayaan Shutes. is a 75 y.o. male . Essential hypertension - Plan: atenolol (TENORMIN) 25 MG tablet, Comprehensive metabolic panel  -Stable control, no med changes at this time.  Continue atenolol, losartan.  Continue follow-up with nephrology and cardiology.  Aneurysm of ascending aorta (HCC) - Plan: atenolol (TENORMIN) 25 MG tablet  -Planned ultrasound ordered by cardiology in the next few months.  Hyperlipidemia, unspecified hyperlipidemia type - Plan: atorvastatin (LIPITOR) 20 MG tablet, Comprehensive metabolic panel, Lipid panel  -Tolerating current regimen, check labs.  History of nonobstructive CAD.  Continue aspirin and statin.  Depression, unspecified depression type  -Stable, option of decreased dose of Wellbutrin but chose to remain on same dose for now.  Stage 3a chronic kidney disease ([place])  -Continue avoid NSAIDs, maintain hydration.  Keep follow-up with nephrology as planned.  Episode of dizziness  -Suspect component of orthostatic hypotension based on description of symptoms, denies focal weakness, facial weakness or other focal neurologic symptoms.  Discussed slowly getting out of bed and RTC precautions if persistent or worsening.  Meds ordered this encounter  Medications  . atenolol (TENORMIN) 25 MG tablet    Sig: TAKE 1 TABLET(25 MG) BY MOUTH DAILY    Dispense:  90 tablet    Refill:  2  . atorvastatin (LIPITOR) 20 MG tablet    Sig: TAKE 1 TABLET(20 MG) BY MOUTH DAILY    Dispense:  90 tablet    Refill:  2  . pantoprazole (PROTONIX) 20 MG tablet    Sig: Take 1 tablet (20 mg total) by mouth daily.    Dispense:  90 tablet    Refill:  2  Patient Instructions  Fleeting dizziness in the morning may have been related to orthostatic hypotension or slight drop in pressure when you first get up.  Make sure to sit on the edge of the bed first and get up slowly after lying down.  If the symptoms return or worsen, please   follow-up and we can discuss further. No other medication changes at this time.  Keep follow-up with specialist as planned.  Thank you for coming in today     Signed, [name], MD [facility]

## 2021-02-27 LAB — LIPID PANEL
Cholesterol: 123 mg/dL (ref 0–200)
HDL: 50.9 mg/dL (ref >39.00)
LDL Cholesterol: 55 mg/dL (ref 0–99)
NonHDL: 72.5
Total CHOL/HDL Ratio: 2
Triglycerides: 88 mg/dL (ref 0.0–149.0)
VLDL: 17.6 mg/dL (ref 0.0–40.0)

## 2021-02-27 LAB — COMPREHENSIVE METABOLIC PANEL
ALT: 11 U/L (ref 0–53)
AST: 15 U/L (ref 0–37)
Albumin: 4.2 g/dL (ref 3.5–5.2)
Alkaline Phosphatase: 102 U/L (ref 39–117)
BUN: 45 mg/dL — ABNORMAL HIGH (ref 6–23)
CO2: 22 mEq/L (ref 19–32)
Calcium: 10 mg/dL (ref 8.4–10.5)
Chloride: 107 mEq/L (ref 96–112)
Creatinine, Ser: 1.67 mg/dL — ABNORMAL HIGH (ref 0.40–1.50)
GFR: 40.33 mL/min — ABNORMAL LOW (ref >60.00)
Glucose, Bld: 98 mg/dL (ref 70–99)
Potassium: 4.4 mEq/L (ref 3.5–5.1)
Sodium: 140 mEq/L (ref 135–145)
Total Bilirubin: 0.4 mg/dL (ref 0.2–1.2)
Total Protein: 6.5 g/dL (ref 6.0–8.3)

## 2021-03-03 DIAGNOSIS — Z96651 Presence of right artificial knee joint: Secondary | ICD-10-CM | POA: Diagnosis not present

## 2021-03-03 DIAGNOSIS — M79661 Pain in right lower leg: Secondary | ICD-10-CM | POA: Diagnosis not present

## 2021-03-10 ENCOUNTER — Telehealth: Payer: Self-pay

## 2021-03-10 NOTE — Telephone Encounter (Signed)
Left voice message for patient to call an schedule an appointment to have the cut on his head evaluated.

## 2021-03-10 NOTE — Telephone Encounter (Signed)
Pt reports hitting head on lid of recycle bin resulting in cut, concerned needs antibiotic, pt will need to be seen for antibiotic/recommendation about antibiotic.

## 2021-03-17 DIAGNOSIS — G8929 Other chronic pain: Secondary | ICD-10-CM | POA: Diagnosis not present

## 2021-03-17 DIAGNOSIS — M25561 Pain in right knee: Secondary | ICD-10-CM | POA: Diagnosis not present

## 2021-03-17 DIAGNOSIS — Z96651 Presence of right artificial knee joint: Secondary | ICD-10-CM | POA: Diagnosis not present

## 2021-03-18 DIAGNOSIS — L814 Other melanin hyperpigmentation: Secondary | ICD-10-CM | POA: Diagnosis not present

## 2021-03-18 DIAGNOSIS — L821 Other seborrheic keratosis: Secondary | ICD-10-CM | POA: Diagnosis not present

## 2021-03-18 DIAGNOSIS — L570 Actinic keratosis: Secondary | ICD-10-CM | POA: Diagnosis not present

## 2021-03-18 DIAGNOSIS — Z85828 Personal history of other malignant neoplasm of skin: Secondary | ICD-10-CM | POA: Diagnosis not present

## 2021-03-18 DIAGNOSIS — D225 Melanocytic nevi of trunk: Secondary | ICD-10-CM | POA: Diagnosis not present

## 2021-04-08 DIAGNOSIS — N1832 Chronic kidney disease, stage 3b: Secondary | ICD-10-CM | POA: Diagnosis not present

## 2021-04-08 DIAGNOSIS — Z96651 Presence of right artificial knee joint: Secondary | ICD-10-CM | POA: Diagnosis not present

## 2021-04-08 DIAGNOSIS — M25561 Pain in right knee: Secondary | ICD-10-CM | POA: Diagnosis not present

## 2021-04-08 DIAGNOSIS — G8929 Other chronic pain: Secondary | ICD-10-CM | POA: Diagnosis not present

## 2021-04-13 DIAGNOSIS — N1832 Chronic kidney disease, stage 3b: Secondary | ICD-10-CM | POA: Diagnosis not present

## 2021-04-13 DIAGNOSIS — D631 Anemia in chronic kidney disease: Secondary | ICD-10-CM | POA: Diagnosis not present

## 2021-04-13 DIAGNOSIS — E559 Vitamin D deficiency, unspecified: Secondary | ICD-10-CM | POA: Diagnosis not present

## 2021-04-13 DIAGNOSIS — R319 Hematuria, unspecified: Secondary | ICD-10-CM | POA: Diagnosis not present

## 2021-04-13 DIAGNOSIS — I129 Hypertensive chronic kidney disease with stage 1 through stage 4 chronic kidney disease, or unspecified chronic kidney disease: Secondary | ICD-10-CM | POA: Diagnosis not present

## 2021-04-13 DIAGNOSIS — E875 Hyperkalemia: Secondary | ICD-10-CM | POA: Diagnosis not present

## 2021-04-30 ENCOUNTER — Other Ambulatory Visit: Payer: Self-pay | Admitting: Family Medicine

## 2021-04-30 DIAGNOSIS — I10 Essential (primary) hypertension: Secondary | ICD-10-CM

## 2021-04-30 DIAGNOSIS — I712 Thoracic aortic aneurysm, without rupture: Secondary | ICD-10-CM

## 2021-04-30 DIAGNOSIS — I7121 Aneurysm of the ascending aorta, without rupture: Secondary | ICD-10-CM

## 2021-05-04 ENCOUNTER — Ambulatory Visit: Payer: Medicare Other

## 2021-05-04 ENCOUNTER — Other Ambulatory Visit: Payer: Self-pay

## 2021-05-04 DIAGNOSIS — I10 Essential (primary) hypertension: Secondary | ICD-10-CM

## 2021-05-13 ENCOUNTER — Other Ambulatory Visit: Payer: Medicare Other

## 2021-05-14 DIAGNOSIS — Z96651 Presence of right artificial knee joint: Secondary | ICD-10-CM | POA: Diagnosis not present

## 2021-05-15 ENCOUNTER — Other Ambulatory Visit: Payer: Medicare Other

## 2021-05-28 NOTE — Progress Notes (Signed)
Follow up visit  Subjective:   Joseph Wender., male    DOB: Feb 04, 1946, 75 y.o.   MRN: [id number]   Chief Complaint  Patient presents with  . Aneurysm of ascending aorta   . Hypertension  . Follow-up    1 year    HPI  75 y/o Caucasian male with controlled hypertension, h/o OSA s/p uvulectomy, ascending aorta aneurysm with Bovine arch, palpitations, here for follow up.  Patient is doing well from cardiac standpoint. He denies chest pain, shortness of breath, palpitations, leg edema, orthopnea, PND, TIA/syncope. Blood pressure generally well controlled, mildly elevated today.    Current Outpatient Medications on File Prior to Visit  Medication Sig Dispense Refill  . aspirin EC 81 MG tablet Take 81 mg by mouth every other day.     Marland Kitchen atenolol (TENORMIN) 25 MG tablet TAKE 1 TABLET(25 MG) BY MOUTH DAILY 90 tablet 2  . atorvastatin (LIPITOR) 20 MG tablet TAKE 1 TABLET(20 MG) BY MOUTH DAILY 90 tablet 2  . buPROPion (WELLBUTRIN SR) 200 MG 12 hr tablet TAKE 2 TABLETS BY MOUTH EVERY DAY 180 tablet 1  . losartan (COZAAR) 100 MG tablet Take 1 tablet (100 mg total) by mouth daily. 90 tablet 3  . pantoprazole (PROTONIX) 20 MG tablet Take 1 tablet (20 mg total) by mouth daily. 90 tablet 2   No current facility-administered medications on file prior to visit.    Cardiovascular studies:  EKG 05/29/2021: Sinus rhythm 54 bpm IVCD  Echocardiogram 05/04/2021:  Left ventricle cavity is normal in size and wall thickness. Normal global  wall motion. LV endocardial border difficult to visualize. LV function  probably normal around 55-60%. Doppler evidence of grade I (impaired)  diastolic dysfunction, normal LAP.  The aortic root is upper limit normal at 3.8 cm.  Trileaflet aortic valve. Mild aortic valve leaflet calcification.    Moderate (Grade II) aortic regurgitation.  Mild tricuspid regurgitation.  No evidence of pulmonary hypertension.  Previous study in 2020 measured aortic root at  4.2 cm. No other  significant change noted.   EKG 05/19/2020: Sinus rhythm 58 bpm Incomplete RBBB  CTA chest and coronary CTA 06/20/2018: Calcium score 888 LM: Normal LAD: 50% or less calcified plaque in prox and mid LAD      <50% calcific plaque distally      D1: <50% ostial calcific plaque      D2: Normal LCx: <50% calcific plaque prox and mid vessel      OM1: <50% calcific plaque      AV grove: Normal RCA: <50% calcific plaque prox, mid, and distal vessel.      PDA: 50% calcific plaque on PLB and PDA  Aortic root: dilated Sinus: 3.7 cm STJ: 3.3 cm Aortic root: 4.2 cm Descending thoracic aorta 2.7 cm  Impression: Calcium score 80th percentile Nonobstructive three vessel CAD Aortic root dilatation 4.2 cm with Bovine arch  Echocardiogram 04/19/2018: Left ventricle cavity is normal in size. Mild concentric hypertrophy of the left ventricle. Normal global wall motion. Calculated EF 69%. Normal diastolic filling pattern. Calcification seen on noncoronary cusp. Moderate (Grade II) aortic regurgitation. Mild tricuspid regurgitation.  No evidence of pulmonary hypertension.  The aortic root is dilated, measuring 4.0 cm at sinotubular junction.  Event monitor 02/17/2018 - 03/18/2018: Sinus rhythm.  No arrhythmias.  No symptoms reported.  Recent labs: 01/28/2020: Glucose 102, BUN/Cr 36/1.7. EGFR 39. Na/K 139/4.8.  H/H 13/40. MCV 92. Platelets 182  06/11/2019: Chol 117, TG 96, HDL 54, LDL 45  06/16/2018: Glucose 95. BUN/Cr 28/1.22. eGFR 60. Na/K 141/4.7   Review of Systems  Cardiovascular:  Negative for chest pain, dyspnea on exertion, leg swelling, palpitations and syncope.       Vitals:   05/29/21 0903  BP: (!) 141/73  Pulse: (!) 52  Resp: 16  Temp: 97.8 F (36.6 C)  SpO2: 98%    Body mass index is 29.29 kg/m. Filed Weights   05/29/21 0903  Weight: 210 lb (95.3 kg)     Objective:   Physical Exam Vitals and nursing note reviewed.  Constitutional:       General: He is not in acute distress. Neck:     Vascular: No JVD.  Cardiovascular:     Rate and Rhythm: Normal rate and regular rhythm.     Heart sounds: Normal heart sounds. No murmur heard. Pulmonary:     Effort: Pulmonary effort is normal.     Breath sounds: Normal breath sounds. No wheezing or rales.  Musculoskeletal:     Right lower leg: No edema.     Left lower leg: No edema.          Assessment & Recommendations:   75 y/o Caucasian male with controlled hypertension, h/o OSA s/p uvulectomy, ascending aorta aneurysm, palpitations, here for follow up.  Ascending aorta aneurysm, grade II AI: Aortic root 3.8-4.2 cm. Stable Continue on losartan 100 mg, atenolol 25 mg daily.  Nonobstructive CAD: Continue aspirin/statin.  Hypertension: Well controlled on atenolol 25 mg, losartan 100 mg.  CKD: F/u w/nephrology.  Pulmonary nodule: Reportedly, known to the patient. Follow up with Dr. Carlota Raspberry.  F/u in 1 year after echcoardiogram  Nigel Mormon, MD [facility] Pager: [phone] Office: [phone] If no answer Cell [phone]

## 2021-05-29 ENCOUNTER — Encounter: Payer: Self-pay | Admitting: Cardiology

## 2021-05-29 ENCOUNTER — Ambulatory Visit: Payer: Medicare Other | Admitting: Cardiology

## 2021-05-29 ENCOUNTER — Other Ambulatory Visit: Payer: Self-pay

## 2021-05-29 VITALS — BP 141/73 | HR 52 | Temp 97.8°F | Resp 16 | Ht 71.0 in | Wt 210.0 lb

## 2021-05-29 DIAGNOSIS — I712 Thoracic aortic aneurysm, without rupture: Secondary | ICD-10-CM | POA: Diagnosis not present

## 2021-05-29 DIAGNOSIS — I10 Essential (primary) hypertension: Secondary | ICD-10-CM

## 2021-05-29 DIAGNOSIS — I2510 Atherosclerotic heart disease of native coronary artery without angina pectoris: Secondary | ICD-10-CM | POA: Diagnosis not present

## 2021-05-29 DIAGNOSIS — I7121 Aneurysm of the ascending aorta, without rupture: Secondary | ICD-10-CM

## 2021-06-10 ENCOUNTER — Telehealth (INDEPENDENT_AMBULATORY_CARE_PROVIDER_SITE_OTHER): Payer: Medicare Other | Admitting: Adult Health

## 2021-06-10 ENCOUNTER — Encounter: Payer: Self-pay | Admitting: Adult Health

## 2021-06-10 VITALS — Ht 71.0 in | Wt 208.0 lb

## 2021-06-10 DIAGNOSIS — W5503XA Scratched by cat, initial encounter: Secondary | ICD-10-CM | POA: Diagnosis not present

## 2021-06-10 DIAGNOSIS — S60511A Abrasion of right hand, initial encounter: Secondary | ICD-10-CM | POA: Diagnosis not present

## 2021-06-10 DIAGNOSIS — S60512A Abrasion of left hand, initial encounter: Secondary | ICD-10-CM

## 2021-06-10 MED ORDER — DOXYCYCLINE HYCLATE 100 MG PO CAPS: 100.0000 mg | ORAL_CAPSULE | Freq: Two times a day (BID) | ORAL | 0 refills | Status: DC

## 2021-06-10 NOTE — Progress Notes (Signed)
Virtual Visit via Telephone Note  I connected with Joseph Maldonado. on 06/10/21 at  4:00 PM EDT by telephone and verified that I am speaking with the correct person using two identifiers.   I discussed the limitations, risks, security and privacy concerns of performing an evaluation and management service by telephone and the availability of in person appointments. I also discussed with the patient that there may be a patient responsible charge related to this service. The patient expressed understanding and agreed to proceed.  Location patient: home Location provider: work or home office Participants present for the call: patient, provider Patient did not have a visit in the prior 7 days to address this/these issue(s).   History of Present Illness: 75 year old male who is being evaluated today for an acute issue.  He reports that earlier this week his house cat scratched him on both hands.  Scratch on the left hand seems to be healing but scratch on his right hand was deeper and has started to turn red.  He denies pain or swelling currently.  The cat is up-to-date on all vaccinations.   Observations/Objective: Patient sounds cheerful and well on the phone. I do not appreciate any SOB. Speech and thought processing are grossly intact. Patient reported vitals:  Assessment and Plan: 1. Cat scratch  - doxycycline (VIBRAMYCIN) 100 MG capsule; Take 1 capsule (100 mg total) by mouth 2 (two) times daily.  Dispense: 14 capsule; Refill: 0   Follow Up Instructions:    I did not refer this patient for an OV in the next 24 hours for this/these issue(s).  I discussed the assessment and treatment plan with the patient. The patient was provided an opportunity to ask questions and all were answered. The patient agreed with the plan and demonstrated an understanding of the instructions.   The patient was advised to call back or seek an in-person evaluation if the symptoms worsen or if the  condition fails to improve as anticipated.  I provided 12 minutes of non-face-to-face time during this encounter.   Dorothyann Peng, NP

## 2021-07-11 ENCOUNTER — Other Ambulatory Visit: Payer: Self-pay

## 2021-07-11 ENCOUNTER — Encounter (HOSPITAL_COMMUNITY): Payer: Self-pay | Admitting: Emergency Medicine

## 2021-07-11 ENCOUNTER — Ambulatory Visit (HOSPITAL_COMMUNITY)
Admission: EM | Admit: 2021-07-11 | Discharge: 2021-07-11 | Disposition: A | Discharge status: Home / Self Care | Payer: Medicare Other | Attending: Internal Medicine | Admitting: Internal Medicine

## 2021-07-11 DIAGNOSIS — J029 Acute pharyngitis, unspecified: Secondary | ICD-10-CM | POA: Diagnosis not present

## 2021-07-11 DIAGNOSIS — Z20822 Contact with and (suspected) exposure to covid-19: Secondary | ICD-10-CM | POA: Insufficient documentation

## 2021-07-11 LAB — POC INFLUENZA A AND B ANTIGEN (URGENT CARE ONLY)
INFLUENZA A ANTIGEN, POC: NEGATIVE
INFLUENZA B ANTIGEN, POC: NEGATIVE

## 2021-07-11 MED ORDER — BENZONATATE 100 MG PO CAPS: 100.0000 mg | ORAL_CAPSULE | Freq: Three times a day (TID) | ORAL | 0 refills | Status: DC | PRN

## 2021-07-11 NOTE — ED Provider Notes (Signed)
[facility]    CSN: [id number] Arrival date & time: 07/11/21  1642      History   Chief Complaint Chief Complaint  Patient presents with   Nasal Congestion    HPI Joseph Maldonado. is a 75 y.o. male comes to the urgent care with a 1 day history of headache, nasal congestion, sneezing, sore throat and a cough.  Patient recently flew back from [place].  He is vaccinated against COVID-19 virus but has not received flu vaccination yet.  He denies any sick contacts.  He tries to be compliant with wearing his mask.  No nausea vomiting or diarrhea.  No abdominal pain.  Patient's cough is without sputum production.  He denies any chest pain or chest pressure.  No generalized body aches.  No chest tightness.   HPI  Past Medical History:  Diagnosis Date   Arthritis    CAD (coronary artery disease)    Chronic kidney disease    Complication of anesthesia    reports " i lose my blood pressure after anesthesia; the drops usually happens in recovery"    Depression    Hand pain    currently being evaluated by rheumatology for dx    Hematuria    Hypertension    Rapid heart beat    rpeorts  " 2 months ago i had a rapid heart beat that lasted 45 min to an hour, ive experience this twice in the last t10 years but only seconds long" " i was evaluated by cardiology Dr Landry Mellow who did a 30 day monitor and ECHO and said i have occasional [PATS] ? , denies chest pain nor LOC  during these episodes; HR today is WDL    Sleep apnea    CPAP     Patient Active Problem List   Diagnosis Date Noted   OSA (obstructive sleep apnea) 06/01/2019   Aneurysm of ascending aorta 05/18/2019   Coronary artery disease involving native coronary artery of native heart without angina pectoris 05/18/2019   Essential hypertension 05/18/2019   History of total hip arthroplasty, right 08/14/2018   Osteoarthritis of right hip 08/12/2018   Palpitations 01/29/2018   SCC (squamous cell carcinoma)  01/28/2018   Solitary pulmonary nodule 10/03/2016   Cough 09/29/2016   Seborrheic keratosis 02/26/2014   Rotator cuff tear arthropathy of right shoulder 01/10/2014   Knee joint replacement by other means 11/14/2012   S/P total knee arthroplasty 10/05/2011   Hip joint replacement by other means 03/01/2011   OA (osteoarthritis) of knee 03/01/2011   CKD (chronic kidney disease) 05/13/2010    Past Surgical History:  Procedure Laterality Date   CATARACT EXTRACTION, BILATERAL     HIP SURGERY Left 2012   JOINT REPLACEMENT     REPLACEMENT TOTAL KNEE Right    2001,2006,2009,2013   REPLACEMENT TOTAL KNEE Left 2011   TOTAL HIP ARTHROPLASTY Right 08/14/2018   Procedure: TOTAL HIP ARTHROPLASTY ANTERIOR APPROACH;  Surgeon: Frederik Pear, MD;  Location: [facility];  Service: Orthopedics;  Laterality: Right;       Home Medications    Prior to Admission medications   Medication Sig Start Date End Date Taking? Authorizing Provider  acetaminophen (TYLENOL) 500 MG tablet Take by mouth.    [provider]  aspirin EC 81 MG tablet Take 81 mg by mouth every other day.     [provider]  atenolol (TENORMIN) 25 MG tablet TAKE 1 TABLET(25 MG) BY MOUTH DAILY 02/26/21   Carlota Raspberry,  Ranell Patrick, MD  atorvastatin (LIPITOR) 20 MG tablet TAKE 1 TABLET(20 MG) BY MOUTH DAILY 02/26/21   Wendie Agreste, MD  buPROPion [facility] SR) 200 MG 12 hr tablet TAKE 2 TABLETS BY MOUTH EVERY DAY 02/24/21   Wendie Agreste, MD  doxycycline (VIBRAMYCIN) 100 MG capsule Take 1 capsule (100 mg total) by mouth 2 (two) times daily. 06/10/21   Nafziger, Tommi Rumps, NP  losartan (COZAAR) 100 MG tablet Take 1 tablet (100 mg total) by mouth daily. 08/28/20   Wendie Agreste, MD  pantoprazole (PROTONIX) 20 MG tablet Take 1 tablet (20 mg total) by mouth daily. 02/26/21   Wendie Agreste, MD    Family History Family History  Problem Relation Age of Onset   Cancer Mother        throat   Hypertension Mother    Heart disease  Mother    Emphysema Mother    Hypertension Father    Cancer Father        Brain   Hypertension Sister    Hematuria Sister    Hematuria Brother     Social History Social History   Tobacco Use   Smoking status: Former    Packs/day: 0.50    Years: 3.00    Pack years: 1.50    Types: Cigarettes    Quit date: 1971    Years since quitting: 51.8   Smokeless tobacco: Never  Vaping Use   Vaping Use: Never used  Substance Use Topics   Alcohol use: Yes    Alcohol/week: 3.0 standard drinks    Types: 3 Cans of beer per week    Comment: max 3 beers/week with a meal.   Drug use: No    Comment: former user of marijuana, quit in 1971     Allergies   Nsaids, Dilaudid [hydromorphone hcl], Sulfa antibiotics, and Hydromorphone   Review of Systems Review of Systems  Constitutional: Negative.   HENT:  Positive for congestion, rhinorrhea and sore throat. Negative for mouth sores, nosebleeds, postnasal drip and voice change.   Respiratory:  Positive for cough. Negative for shortness of breath and wheezing.   Musculoskeletal:  Negative for arthralgias and myalgias.    Physical Exam Triage Vital Signs ED Triage Vitals  Enc Vitals Group     BP 07/11/21 1745 (!) 144/79     Pulse Rate 07/11/21 1745 67     Resp 07/11/21 1745 18     Temp 07/11/21 1745 98.5 F (36.9 C)     Temp src --      SpO2 07/11/21 1745 98 %     Weight --      Height --      Head Circumference --      Peak Flow --      Pain Score 07/11/21 1743 2     Pain Loc --      Pain Edu? --      Excl. in [place]? --    No data found.  Updated Vital Signs BP (!) 144/79 (BP Location: Left Arm)   Pulse 67   Temp 98.5 F (36.9 C)   Resp 18   SpO2 98%   Visual Acuity Right Eye Distance:   Left Eye Distance:   Bilateral Distance:    Right Eye Near:   Left Eye Near:    Bilateral Near:     Physical Exam Vitals and nursing note reviewed.  Constitutional:      General: He is not in acute distress.  Appearance: He  is not ill-appearing.  HENT:     Right Ear: Tympanic membrane normal.     Left Ear: Tympanic membrane normal.     Nose: Congestion present. No rhinorrhea.     Mouth/Throat:     Pharynx: No posterior oropharyngeal erythema.  Cardiovascular:     Rate and Rhythm: Normal rate and regular rhythm.     Pulses: Normal pulses.     Heart sounds: Normal heart sounds.  Pulmonary:     Effort: Pulmonary effort is normal.     Breath sounds: Normal breath sounds.  Abdominal:     General: Bowel sounds are normal.     Palpations: Abdomen is soft.  Neurological:     Mental Status: He is alert.     UC Treatments / Results  Labs (all labs ordered are listed, but only abnormal results are displayed) Labs Reviewed  SARS CORONAVIRUS 2 (TAT 6-24 HRS)  POC INFLUENZA A AND B ANTIGEN (URGENT CARE ONLY)    EKG   Radiology No results found.  Procedures Procedures (including critical care time)  Medications Ordered in UC Medications - No data to display  Initial Impression / Assessment and Plan / UC Course  I have reviewed the triage vital signs and the nursing notes.  Pertinent labs & imaging results that were available during my care of the patient were reviewed by me and considered in my medical decision making (see chart for details).     1.  Acute viral pharyngitis: Point-of-care flu a plus B was negative COVID-19 PCR test  Increase oral fluid intake Tylenol/Motrin as needed for pain Return to urgent care if symptoms persist. Final Clinical Impressions(s) / UC Diagnoses   Final diagnoses:  None   Discharge Instructions   None    ED Prescriptions   None    PDMP not reviewed this encounter.   Chase Picket, MD 07/11/21 [phone]

## 2021-07-11 NOTE — ED Triage Notes (Signed)
Headache, congestion, sneezing, tickle in throat, dry mouth, and cough started for couple days.

## 2021-07-11 NOTE — Discharge Instructions (Signed)
Warm salt water gargle Tylenol/Motrin as needed for pain and/or fever We will call you with recommendations if labs are abnormal Return to urgent care if symptoms worsen

## 2021-07-12 LAB — SARS CORONAVIRUS 2 (TAT 6-24 HRS): SARS Coronavirus 2: NEGATIVE

## 2021-07-14 DIAGNOSIS — R3129 Other microscopic hematuria: Secondary | ICD-10-CM | POA: Diagnosis not present

## 2021-07-14 DIAGNOSIS — Z1331 Encounter for screening for depression: Secondary | ICD-10-CM | POA: Diagnosis not present

## 2021-07-14 DIAGNOSIS — M1990 Unspecified osteoarthritis, unspecified site: Secondary | ICD-10-CM | POA: Diagnosis not present

## 2021-07-14 DIAGNOSIS — I25118 Atherosclerotic heart disease of native coronary artery with other forms of angina pectoris: Secondary | ICD-10-CM | POA: Diagnosis not present

## 2021-07-14 DIAGNOSIS — I10 Essential (primary) hypertension: Secondary | ICD-10-CM | POA: Diagnosis not present

## 2021-07-14 DIAGNOSIS — G4733 Obstructive sleep apnea (adult) (pediatric): Secondary | ICD-10-CM | POA: Diagnosis not present

## 2021-07-14 DIAGNOSIS — D692 Other nonthrombocytopenic purpura: Secondary | ICD-10-CM | POA: Diagnosis not present

## 2021-07-14 DIAGNOSIS — N1831 Chronic kidney disease, stage 3a: Secondary | ICD-10-CM | POA: Diagnosis not present

## 2021-07-14 DIAGNOSIS — E785 Hyperlipidemia, unspecified: Secondary | ICD-10-CM | POA: Diagnosis not present

## 2021-07-14 DIAGNOSIS — I7121 Aneurysm of the ascending aorta, without rupture: Secondary | ICD-10-CM | POA: Diagnosis not present

## 2021-07-14 DIAGNOSIS — Z23 Encounter for immunization: Secondary | ICD-10-CM | POA: Diagnosis not present

## 2021-07-15 ENCOUNTER — Other Ambulatory Visit: Payer: Self-pay | Admitting: Internal Medicine

## 2021-07-15 DIAGNOSIS — Z0000 Encounter for general adult medical examination without abnormal findings: Secondary | ICD-10-CM

## 2021-07-16 DIAGNOSIS — M19011 Primary osteoarthritis, right shoulder: Secondary | ICD-10-CM | POA: Diagnosis not present

## 2021-07-16 DIAGNOSIS — M25561 Pain in right knee: Secondary | ICD-10-CM | POA: Diagnosis not present

## 2021-07-17 DIAGNOSIS — Z961 Presence of intraocular lens: Secondary | ICD-10-CM | POA: Diagnosis not present

## 2021-07-17 DIAGNOSIS — H43813 Vitreous degeneration, bilateral: Secondary | ICD-10-CM | POA: Diagnosis not present

## 2021-07-17 DIAGNOSIS — I10 Essential (primary) hypertension: Secondary | ICD-10-CM | POA: Diagnosis not present

## 2021-07-17 DIAGNOSIS — R051 Acute cough: Secondary | ICD-10-CM | POA: Diagnosis not present

## 2021-07-17 DIAGNOSIS — J069 Acute upper respiratory infection, unspecified: Secondary | ICD-10-CM | POA: Diagnosis not present

## 2021-07-27 DIAGNOSIS — M75101 Unspecified rotator cuff tear or rupture of right shoulder, not specified as traumatic: Secondary | ICD-10-CM | POA: Diagnosis not present

## 2021-07-27 DIAGNOSIS — R262 Difficulty in walking, not elsewhere classified: Secondary | ICD-10-CM | POA: Diagnosis not present

## 2021-07-27 DIAGNOSIS — Z96651 Presence of right artificial knee joint: Secondary | ICD-10-CM | POA: Diagnosis not present

## 2021-07-27 DIAGNOSIS — Z96652 Presence of left artificial knee joint: Secondary | ICD-10-CM | POA: Diagnosis not present

## 2021-07-29 ENCOUNTER — Ambulatory Visit
Admission: RE | Admit: 2021-07-29 | Discharge: 2021-07-29 | Disposition: A | Discharge status: Home / Self Care | Payer: Medicare Other | Source: Ambulatory Visit | Attending: Internal Medicine | Admitting: Internal Medicine

## 2021-07-29 DIAGNOSIS — Z136 Encounter for screening for cardiovascular disorders: Secondary | ICD-10-CM | POA: Diagnosis not present

## 2021-07-29 DIAGNOSIS — Z0000 Encounter for general adult medical examination without abnormal findings: Secondary | ICD-10-CM

## 2021-07-29 DIAGNOSIS — Z87891 Personal history of nicotine dependence: Secondary | ICD-10-CM | POA: Diagnosis not present

## 2021-08-03 DIAGNOSIS — R262 Difficulty in walking, not elsewhere classified: Secondary | ICD-10-CM | POA: Diagnosis not present

## 2021-08-03 DIAGNOSIS — Z96652 Presence of left artificial knee joint: Secondary | ICD-10-CM | POA: Diagnosis not present

## 2021-08-03 DIAGNOSIS — Z96651 Presence of right artificial knee joint: Secondary | ICD-10-CM | POA: Diagnosis not present

## 2021-08-03 DIAGNOSIS — M75101 Unspecified rotator cuff tear or rupture of right shoulder, not specified as traumatic: Secondary | ICD-10-CM | POA: Diagnosis not present

## 2021-08-05 DIAGNOSIS — M75101 Unspecified rotator cuff tear or rupture of right shoulder, not specified as traumatic: Secondary | ICD-10-CM | POA: Diagnosis not present

## 2021-08-05 DIAGNOSIS — Z96651 Presence of right artificial knee joint: Secondary | ICD-10-CM | POA: Diagnosis not present

## 2021-08-05 DIAGNOSIS — Z96652 Presence of left artificial knee joint: Secondary | ICD-10-CM | POA: Diagnosis not present

## 2021-08-05 DIAGNOSIS — R262 Difficulty in walking, not elsewhere classified: Secondary | ICD-10-CM | POA: Diagnosis not present

## 2021-08-07 DIAGNOSIS — R051 Acute cough: Secondary | ICD-10-CM | POA: Diagnosis not present

## 2021-08-07 DIAGNOSIS — J40 Bronchitis, not specified as acute or chronic: Secondary | ICD-10-CM | POA: Diagnosis not present

## 2021-08-14 DIAGNOSIS — M19211 Secondary osteoarthritis, right shoulder: Secondary | ICD-10-CM | POA: Diagnosis not present

## 2021-08-17 DIAGNOSIS — R262 Difficulty in walking, not elsewhere classified: Secondary | ICD-10-CM | POA: Diagnosis not present

## 2021-08-17 DIAGNOSIS — Z96651 Presence of right artificial knee joint: Secondary | ICD-10-CM | POA: Diagnosis not present

## 2021-08-17 DIAGNOSIS — Z96652 Presence of left artificial knee joint: Secondary | ICD-10-CM | POA: Diagnosis not present

## 2021-08-17 DIAGNOSIS — M75101 Unspecified rotator cuff tear or rupture of right shoulder, not specified as traumatic: Secondary | ICD-10-CM | POA: Diagnosis not present

## 2021-08-27 DIAGNOSIS — R262 Difficulty in walking, not elsewhere classified: Secondary | ICD-10-CM | POA: Diagnosis not present

## 2021-08-27 DIAGNOSIS — Z96652 Presence of left artificial knee joint: Secondary | ICD-10-CM | POA: Diagnosis not present

## 2021-08-27 DIAGNOSIS — M75101 Unspecified rotator cuff tear or rupture of right shoulder, not specified as traumatic: Secondary | ICD-10-CM | POA: Diagnosis not present

## 2021-08-27 DIAGNOSIS — Z96651 Presence of right artificial knee joint: Secondary | ICD-10-CM | POA: Diagnosis not present

## 2021-08-28 ENCOUNTER — Ambulatory Visit: Payer: Medicare Other | Admitting: Family Medicine

## 2021-08-28 ENCOUNTER — Other Ambulatory Visit: Payer: Self-pay | Admitting: Family Medicine

## 2021-08-28 DIAGNOSIS — I7121 Aneurysm of the ascending aorta, without rupture: Secondary | ICD-10-CM

## 2021-08-28 DIAGNOSIS — I10 Essential (primary) hypertension: Secondary | ICD-10-CM

## 2021-08-31 DIAGNOSIS — M75101 Unspecified rotator cuff tear or rupture of right shoulder, not specified as traumatic: Secondary | ICD-10-CM | POA: Diagnosis not present

## 2021-08-31 DIAGNOSIS — Z96651 Presence of right artificial knee joint: Secondary | ICD-10-CM | POA: Diagnosis not present

## 2021-08-31 DIAGNOSIS — R262 Difficulty in walking, not elsewhere classified: Secondary | ICD-10-CM | POA: Diagnosis not present

## 2021-08-31 DIAGNOSIS — Z96652 Presence of left artificial knee joint: Secondary | ICD-10-CM | POA: Diagnosis not present

## 2021-09-03 DIAGNOSIS — R262 Difficulty in walking, not elsewhere classified: Secondary | ICD-10-CM | POA: Diagnosis not present

## 2021-09-03 DIAGNOSIS — M75101 Unspecified rotator cuff tear or rupture of right shoulder, not specified as traumatic: Secondary | ICD-10-CM | POA: Diagnosis not present

## 2021-09-03 DIAGNOSIS — Z96652 Presence of left artificial knee joint: Secondary | ICD-10-CM | POA: Diagnosis not present

## 2021-09-03 DIAGNOSIS — Z96651 Presence of right artificial knee joint: Secondary | ICD-10-CM | POA: Diagnosis not present

## 2021-09-09 DIAGNOSIS — Z96652 Presence of left artificial knee joint: Secondary | ICD-10-CM | POA: Diagnosis not present

## 2021-09-09 DIAGNOSIS — Z96651 Presence of right artificial knee joint: Secondary | ICD-10-CM | POA: Diagnosis not present

## 2021-09-09 DIAGNOSIS — Z885 Allergy status to narcotic agent status: Secondary | ICD-10-CM | POA: Diagnosis not present

## 2021-09-09 DIAGNOSIS — M75101 Unspecified rotator cuff tear or rupture of right shoulder, not specified as traumatic: Secondary | ICD-10-CM | POA: Diagnosis not present

## 2021-09-09 DIAGNOSIS — J387 Other diseases of larynx: Secondary | ICD-10-CM | POA: Diagnosis not present

## 2021-09-09 DIAGNOSIS — R490 Dysphonia: Secondary | ICD-10-CM | POA: Diagnosis not present

## 2021-09-09 DIAGNOSIS — J383 Other diseases of vocal cords: Secondary | ICD-10-CM | POA: Diagnosis not present

## 2021-09-09 DIAGNOSIS — Z882 Allergy status to sulfonamides status: Secondary | ICD-10-CM | POA: Diagnosis not present

## 2021-09-09 DIAGNOSIS — R053 Chronic cough: Secondary | ICD-10-CM | POA: Diagnosis not present

## 2021-09-09 DIAGNOSIS — R262 Difficulty in walking, not elsewhere classified: Secondary | ICD-10-CM | POA: Diagnosis not present

## 2021-09-09 DIAGNOSIS — R0989 Other specified symptoms and signs involving the circulatory and respiratory systems: Secondary | ICD-10-CM | POA: Diagnosis not present

## 2021-09-09 DIAGNOSIS — Z886 Allergy status to analgesic agent status: Secondary | ICD-10-CM | POA: Diagnosis not present

## 2021-09-10 ENCOUNTER — Ambulatory Visit: Payer: Medicare Other | Admitting: Adult Health

## 2021-09-11 DIAGNOSIS — Z96651 Presence of right artificial knee joint: Secondary | ICD-10-CM | POA: Diagnosis not present

## 2021-09-11 DIAGNOSIS — M75101 Unspecified rotator cuff tear or rupture of right shoulder, not specified as traumatic: Secondary | ICD-10-CM | POA: Diagnosis not present

## 2021-09-11 DIAGNOSIS — R262 Difficulty in walking, not elsewhere classified: Secondary | ICD-10-CM | POA: Diagnosis not present

## 2021-09-11 DIAGNOSIS — Z96652 Presence of left artificial knee joint: Secondary | ICD-10-CM | POA: Diagnosis not present

## 2021-09-16 DIAGNOSIS — R262 Difficulty in walking, not elsewhere classified: Secondary | ICD-10-CM | POA: Diagnosis not present

## 2021-09-16 DIAGNOSIS — Z96651 Presence of right artificial knee joint: Secondary | ICD-10-CM | POA: Diagnosis not present

## 2021-09-16 DIAGNOSIS — Z96652 Presence of left artificial knee joint: Secondary | ICD-10-CM | POA: Diagnosis not present

## 2021-09-16 DIAGNOSIS — M75101 Unspecified rotator cuff tear or rupture of right shoulder, not specified as traumatic: Secondary | ICD-10-CM | POA: Diagnosis not present

## 2021-09-17 DIAGNOSIS — L570 Actinic keratosis: Secondary | ICD-10-CM | POA: Diagnosis not present

## 2021-09-17 DIAGNOSIS — L821 Other seborrheic keratosis: Secondary | ICD-10-CM | POA: Diagnosis not present

## 2021-09-17 DIAGNOSIS — Z85828 Personal history of other malignant neoplasm of skin: Secondary | ICD-10-CM | POA: Diagnosis not present

## 2021-09-17 DIAGNOSIS — L814 Other melanin hyperpigmentation: Secondary | ICD-10-CM | POA: Diagnosis not present

## 2021-09-17 DIAGNOSIS — D485 Neoplasm of uncertain behavior of skin: Secondary | ICD-10-CM | POA: Diagnosis not present

## 2021-09-17 DIAGNOSIS — D225 Melanocytic nevi of trunk: Secondary | ICD-10-CM | POA: Diagnosis not present

## 2021-09-17 DIAGNOSIS — L308 Other specified dermatitis: Secondary | ICD-10-CM | POA: Diagnosis not present

## 2021-09-25 DIAGNOSIS — Z96652 Presence of left artificial knee joint: Secondary | ICD-10-CM | POA: Diagnosis not present

## 2021-09-25 DIAGNOSIS — M75101 Unspecified rotator cuff tear or rupture of right shoulder, not specified as traumatic: Secondary | ICD-10-CM | POA: Diagnosis not present

## 2021-09-25 DIAGNOSIS — Z96651 Presence of right artificial knee joint: Secondary | ICD-10-CM | POA: Diagnosis not present

## 2021-09-25 DIAGNOSIS — R262 Difficulty in walking, not elsewhere classified: Secondary | ICD-10-CM | POA: Diagnosis not present

## 2021-09-30 DIAGNOSIS — L988 Other specified disorders of the skin and subcutaneous tissue: Secondary | ICD-10-CM | POA: Diagnosis not present

## 2021-09-30 DIAGNOSIS — Z85828 Personal history of other malignant neoplasm of skin: Secondary | ICD-10-CM | POA: Diagnosis not present

## 2021-09-30 DIAGNOSIS — D485 Neoplasm of uncertain behavior of skin: Secondary | ICD-10-CM | POA: Diagnosis not present

## 2021-10-02 DIAGNOSIS — Z96652 Presence of left artificial knee joint: Secondary | ICD-10-CM | POA: Diagnosis not present

## 2021-10-02 DIAGNOSIS — M75101 Unspecified rotator cuff tear or rupture of right shoulder, not specified as traumatic: Secondary | ICD-10-CM | POA: Diagnosis not present

## 2021-10-02 DIAGNOSIS — R262 Difficulty in walking, not elsewhere classified: Secondary | ICD-10-CM | POA: Diagnosis not present

## 2021-10-02 DIAGNOSIS — Z96651 Presence of right artificial knee joint: Secondary | ICD-10-CM | POA: Diagnosis not present

## 2021-10-03 DIAGNOSIS — H1131 Conjunctival hemorrhage, right eye: Secondary | ICD-10-CM | POA: Diagnosis not present

## 2021-10-05 DIAGNOSIS — H1131 Conjunctival hemorrhage, right eye: Secondary | ICD-10-CM | POA: Diagnosis not present

## 2021-10-05 DIAGNOSIS — Z96652 Presence of left artificial knee joint: Secondary | ICD-10-CM | POA: Diagnosis not present

## 2021-10-05 DIAGNOSIS — S0501XA Injury of conjunctiva and corneal abrasion without foreign body, right eye, initial encounter: Secondary | ICD-10-CM | POA: Diagnosis not present

## 2021-10-05 DIAGNOSIS — M75101 Unspecified rotator cuff tear or rupture of right shoulder, not specified as traumatic: Secondary | ICD-10-CM | POA: Diagnosis not present

## 2021-10-05 DIAGNOSIS — Z96651 Presence of right artificial knee joint: Secondary | ICD-10-CM | POA: Diagnosis not present

## 2021-10-05 DIAGNOSIS — R262 Difficulty in walking, not elsewhere classified: Secondary | ICD-10-CM | POA: Diagnosis not present

## 2021-10-07 DIAGNOSIS — M75101 Unspecified rotator cuff tear or rupture of right shoulder, not specified as traumatic: Secondary | ICD-10-CM | POA: Diagnosis not present

## 2021-10-07 DIAGNOSIS — Z96652 Presence of left artificial knee joint: Secondary | ICD-10-CM | POA: Diagnosis not present

## 2021-10-07 DIAGNOSIS — Z96651 Presence of right artificial knee joint: Secondary | ICD-10-CM | POA: Diagnosis not present

## 2021-10-07 DIAGNOSIS — R262 Difficulty in walking, not elsewhere classified: Secondary | ICD-10-CM | POA: Diagnosis not present

## 2021-10-09 DIAGNOSIS — N1832 Chronic kidney disease, stage 3b: Secondary | ICD-10-CM | POA: Diagnosis not present

## 2021-10-14 DIAGNOSIS — N1832 Chronic kidney disease, stage 3b: Secondary | ICD-10-CM | POA: Diagnosis not present

## 2021-10-14 DIAGNOSIS — R319 Hematuria, unspecified: Secondary | ICD-10-CM | POA: Diagnosis not present

## 2021-10-14 DIAGNOSIS — E872 Acidosis, unspecified: Secondary | ICD-10-CM | POA: Diagnosis not present

## 2021-10-14 DIAGNOSIS — E875 Hyperkalemia: Secondary | ICD-10-CM | POA: Diagnosis not present

## 2021-10-14 DIAGNOSIS — E559 Vitamin D deficiency, unspecified: Secondary | ICD-10-CM | POA: Diagnosis not present

## 2021-10-14 DIAGNOSIS — D631 Anemia in chronic kidney disease: Secondary | ICD-10-CM | POA: Diagnosis not present

## 2021-10-14 DIAGNOSIS — I129 Hypertensive chronic kidney disease with stage 1 through stage 4 chronic kidney disease, or unspecified chronic kidney disease: Secondary | ICD-10-CM | POA: Diagnosis not present

## 2021-10-16 ENCOUNTER — Other Ambulatory Visit: Payer: Self-pay | Admitting: Family Medicine

## 2021-10-16 DIAGNOSIS — I7121 Aneurysm of the ascending aorta, without rupture: Secondary | ICD-10-CM

## 2021-10-16 DIAGNOSIS — I10 Essential (primary) hypertension: Secondary | ICD-10-CM

## 2021-10-19 ENCOUNTER — Other Ambulatory Visit (HOSPITAL_COMMUNITY): Payer: Self-pay

## 2021-10-19 MED ORDER — BENZONATATE 100 MG PO CAPS
100.0000 mg | ORAL_CAPSULE | Freq: Three times a day (TID) | ORAL | 1 refills | Status: DC
  Filled 2022-01-15: qty 20, 7d supply, fill #0

## 2021-10-19 MED ORDER — SODIUM BICARBONATE 650 MG PO TABS
650.0000 mg | ORAL_TABLET | Freq: Two times a day (BID) | ORAL | 0 refills | Status: DC
  Filled 2022-01-15: qty 120, 60d supply, fill #0
  Filled 2022-05-14: qty 120, 60d supply, fill #1
  Filled 2022-08-11: qty 60, 30d supply, fill #2

## 2021-10-19 MED ORDER — PANTOPRAZOLE SODIUM 20 MG PO TBEC: 20.0000 mg | DELAYED_RELEASE_TABLET | Freq: Every day | ORAL | 1 refills | Status: DC

## 2021-10-20 DIAGNOSIS — M75101 Unspecified rotator cuff tear or rupture of right shoulder, not specified as traumatic: Secondary | ICD-10-CM | POA: Diagnosis not present

## 2021-10-20 DIAGNOSIS — R262 Difficulty in walking, not elsewhere classified: Secondary | ICD-10-CM | POA: Diagnosis not present

## 2021-10-20 DIAGNOSIS — Z96651 Presence of right artificial knee joint: Secondary | ICD-10-CM | POA: Diagnosis not present

## 2021-10-20 DIAGNOSIS — Z96652 Presence of left artificial knee joint: Secondary | ICD-10-CM | POA: Diagnosis not present

## 2021-10-23 ENCOUNTER — Other Ambulatory Visit (HOSPITAL_COMMUNITY): Payer: Self-pay

## 2021-10-23 MED ORDER — AMOXICILLIN 500 MG PO CAPS
ORAL_CAPSULE | ORAL | 0 refills | Status: DC
  Filled 2021-10-23: qty 27, 9d supply, fill #0

## 2021-10-29 DIAGNOSIS — M959 Acquired deformity of musculoskeletal system, unspecified: Secondary | ICD-10-CM | POA: Diagnosis not present

## 2021-10-29 DIAGNOSIS — M85821 Other specified disorders of bone density and structure, right upper arm: Secondary | ICD-10-CM | POA: Diagnosis not present

## 2021-11-03 ENCOUNTER — Other Ambulatory Visit (HOSPITAL_COMMUNITY): Payer: Self-pay

## 2021-11-03 DIAGNOSIS — N1831 Chronic kidney disease, stage 3a: Secondary | ICD-10-CM | POA: Diagnosis not present

## 2021-11-03 DIAGNOSIS — R3129 Other microscopic hematuria: Secondary | ICD-10-CM | POA: Diagnosis not present

## 2021-11-03 DIAGNOSIS — R053 Chronic cough: Secondary | ICD-10-CM | POA: Diagnosis not present

## 2021-11-03 DIAGNOSIS — Z1331 Encounter for screening for depression: Secondary | ICD-10-CM | POA: Diagnosis not present

## 2021-11-03 DIAGNOSIS — D692 Other nonthrombocytopenic purpura: Secondary | ICD-10-CM | POA: Diagnosis not present

## 2021-11-03 DIAGNOSIS — I7121 Aneurysm of the ascending aorta, without rupture: Secondary | ICD-10-CM | POA: Diagnosis not present

## 2021-11-03 DIAGNOSIS — M85821 Other specified disorders of bone density and structure, right upper arm: Secondary | ICD-10-CM | POA: Diagnosis not present

## 2021-11-03 DIAGNOSIS — I10 Essential (primary) hypertension: Secondary | ICD-10-CM | POA: Diagnosis not present

## 2021-11-03 DIAGNOSIS — E785 Hyperlipidemia, unspecified: Secondary | ICD-10-CM | POA: Diagnosis not present

## 2021-11-03 DIAGNOSIS — N529 Male erectile dysfunction, unspecified: Secondary | ICD-10-CM | POA: Diagnosis not present

## 2021-11-03 DIAGNOSIS — J40 Bronchitis, not specified as acute or chronic: Secondary | ICD-10-CM | POA: Diagnosis not present

## 2021-11-03 DIAGNOSIS — M959 Acquired deformity of musculoskeletal system, unspecified: Secondary | ICD-10-CM | POA: Diagnosis not present

## 2021-11-03 MED ORDER — SILDENAFIL CITRATE 50 MG PO TABS
ORAL_TABLET | ORAL | 0 refills | Status: DC
  Filled 2021-11-03: qty 6, 30d supply, fill #0

## 2021-11-03 MED ORDER — PANTOPRAZOLE SODIUM 20 MG PO TBEC
20.0000 mg | DELAYED_RELEASE_TABLET | Freq: Every day | ORAL | 3 refills | Status: DC
  Filled 2021-11-03: qty 90, 90d supply, fill #0
  Filled 2022-01-15: qty 90, 90d supply, fill #1

## 2021-11-04 DIAGNOSIS — Z96651 Presence of right artificial knee joint: Secondary | ICD-10-CM | POA: Diagnosis not present

## 2021-11-04 DIAGNOSIS — Z96652 Presence of left artificial knee joint: Secondary | ICD-10-CM | POA: Diagnosis not present

## 2021-11-04 DIAGNOSIS — M75101 Unspecified rotator cuff tear or rupture of right shoulder, not specified as traumatic: Secondary | ICD-10-CM | POA: Diagnosis not present

## 2021-11-04 DIAGNOSIS — R262 Difficulty in walking, not elsewhere classified: Secondary | ICD-10-CM | POA: Diagnosis not present

## 2021-11-09 ENCOUNTER — Other Ambulatory Visit (HOSPITAL_COMMUNITY): Payer: Self-pay

## 2021-11-09 MED ORDER — PANTOPRAZOLE SODIUM 20 MG PO TBEC
DELAYED_RELEASE_TABLET | ORAL | 3 refills | Status: DC
  Filled 2021-11-09 – 2022-05-14 (×2): qty 90, 90d supply, fill #0
  Filled 2022-08-11: qty 90, 90d supply, fill #1

## 2021-11-09 MED FILL — Bupropion HCl Tab ER 12HR 200 MG: ORAL | 90 days supply | Qty: 180 | Fill #0 | Status: AC

## 2021-11-10 ENCOUNTER — Other Ambulatory Visit (HOSPITAL_COMMUNITY): Payer: Self-pay

## 2021-11-11 DIAGNOSIS — Z96651 Presence of right artificial knee joint: Secondary | ICD-10-CM | POA: Diagnosis not present

## 2021-11-11 DIAGNOSIS — M75101 Unspecified rotator cuff tear or rupture of right shoulder, not specified as traumatic: Secondary | ICD-10-CM | POA: Diagnosis not present

## 2021-11-11 DIAGNOSIS — R262 Difficulty in walking, not elsewhere classified: Secondary | ICD-10-CM | POA: Diagnosis not present

## 2021-11-11 DIAGNOSIS — Z96652 Presence of left artificial knee joint: Secondary | ICD-10-CM | POA: Diagnosis not present

## 2021-11-18 ENCOUNTER — Other Ambulatory Visit (HOSPITAL_COMMUNITY): Payer: Self-pay

## 2021-12-03 ENCOUNTER — Other Ambulatory Visit (HOSPITAL_COMMUNITY): Payer: Self-pay

## 2021-12-23 ENCOUNTER — Other Ambulatory Visit (HOSPITAL_COMMUNITY): Payer: Self-pay

## 2021-12-23 MED ORDER — LOSARTAN POTASSIUM 100 MG PO TABS
ORAL_TABLET | ORAL | 2 refills | Status: DC
  Filled 2021-12-23: qty 90, 90d supply, fill #0
  Filled 2022-01-15 – 2022-03-10 (×2): qty 90, 90d supply, fill #1
  Filled 2022-05-14 – 2022-06-08 (×2): qty 90, 90d supply, fill #2

## 2021-12-23 MED ORDER — ATENOLOL 25 MG PO TABS
ORAL_TABLET | ORAL | 2 refills | Status: DC
  Filled 2021-12-23: qty 90, 90d supply, fill #0
  Filled 2022-01-15 – 2022-03-02 (×2): qty 90, 90d supply, fill #1
  Filled 2022-05-14 – 2022-06-08 (×3): qty 90, 90d supply, fill #2

## 2022-01-15 ENCOUNTER — Other Ambulatory Visit (HOSPITAL_COMMUNITY): Payer: Self-pay

## 2022-01-27 DIAGNOSIS — R053 Chronic cough: Secondary | ICD-10-CM | POA: Diagnosis not present

## 2022-01-27 DIAGNOSIS — J383 Other diseases of vocal cords: Secondary | ICD-10-CM | POA: Diagnosis not present

## 2022-01-27 DIAGNOSIS — J387 Other diseases of larynx: Secondary | ICD-10-CM | POA: Diagnosis not present

## 2022-01-27 DIAGNOSIS — R490 Dysphonia: Secondary | ICD-10-CM | POA: Diagnosis not present

## 2022-01-27 DIAGNOSIS — Z87891 Personal history of nicotine dependence: Secondary | ICD-10-CM | POA: Diagnosis not present

## 2022-02-04 ENCOUNTER — Ambulatory Visit: Payer: Medicare Other

## 2022-02-04 ENCOUNTER — Ambulatory Visit: Payer: Medicare Other | Admitting: Adult Health

## 2022-02-04 ENCOUNTER — Encounter: Payer: Self-pay | Admitting: Podiatry

## 2022-02-04 ENCOUNTER — Ambulatory Visit (INDEPENDENT_AMBULATORY_CARE_PROVIDER_SITE_OTHER): Payer: Medicare Other | Admitting: Podiatry

## 2022-02-04 DIAGNOSIS — D2371 Other benign neoplasm of skin of right lower limb, including hip: Secondary | ICD-10-CM

## 2022-02-04 DIAGNOSIS — M2041 Other hammer toe(s) (acquired), right foot: Secondary | ICD-10-CM

## 2022-02-07 NOTE — Progress Notes (Signed)
?Subjective:  ?Patient ID: Joseph Maldonado., male    DOB: [birth year],  MRN: [id number] ?HPI ?Chief Complaint  ?Patient presents with  ? Toe Pain  ?  3rd toe right - hammer toe x years, corn tip of toe, right leg is shorter due to multiple knee surgeries, any recommendations  ? New Patient (Initial Visit)  ?  Est pt 2017  ? ? ?76 y.o. male presents with the above complaint.  ? ?ROS: Denies fever chills nausea vomit muscle aches pains calf pain back pain chest pain shortness of breath. ? ?Past Medical History:  ?Diagnosis Date  ? Arthritis   ? CAD (coronary artery disease)   ? Chronic kidney disease   ? Complication of anesthesia   ? reports " i lose my blood pressure after anesthesia; the drops usually happens in recovery"   ? Depression   ? Hand pain   ? currently being evaluated by rheumatology for dx   ? Hematuria   ? Hypertension   ? Rapid heart beat   ? rpeorts  " 2 months ago i had a rapid heart beat that lasted 45 min to an hour, ive experience this twice in the last t10 years but only seconds long" " i was evaluated by cardiology Dr Landry Mellow who did a 30 day monitor and ECHO and said i have occasional [PATS] ? , denies chest pain nor LOC  during these episodes; HR today is WDL   ? Sleep apnea   ? CPAP   ? ?Past Surgical History:  ?Procedure Laterality Date  ? CATARACT EXTRACTION, BILATERAL    ? HIP SURGERY Left 2012  ? JOINT REPLACEMENT    ? REPLACEMENT TOTAL KNEE Right   ? 2001,2006,2009,2013  ? REPLACEMENT TOTAL KNEE Left 2011  ? TOTAL HIP ARTHROPLASTY Right 08/14/2018  ? Procedure: TOTAL HIP ARTHROPLASTY ANTERIOR APPROACH;  Surgeon: Frederik Pear, MD;  Location: [facility];  Service: Orthopedics;  Laterality: Right;  ? ? ?Current Outpatient Medications:  ?  acetaminophen (TYLENOL) 500 MG tablet, Take by mouth., Disp: , Rfl:  ?  amoxicillin (AMOXIL) 500 MG capsule, Take 1 capsule by mouth 3 times a day until gone., Disp: 27 capsule, Rfl: 0 ?  aspirin EC 81 MG tablet, Take 81 mg by mouth every other day. ,  Disp: , Rfl:  ?  atenolol (TENORMIN) 25 MG tablet, TAKE 1 TABLET(25 MG) BY MOUTH DAILY, Disp: 90 tablet, Rfl: 2 ?  atenolol (TENORMIN) 25 MG tablet, Take 1 tablet by mouth once a day, Disp: 90 tablet, Rfl: 2 ?  atorvastatin (LIPITOR) 20 MG tablet, TAKE 1 TABLET BY MOUTH DAILY, Disp: 90 tablet, Rfl: 0 ?  benzonatate (TESSALON) 100 MG capsule, Take 1 capsule by mouth 3 times daily for up to 14 days as needed for cough, Disp: 20 capsule, Rfl: 1 ?  buPROPion (WELLBUTRIN SR) 200 MG 12 hr tablet, TAKE 2 TABLETS BY MOUTH EVERY DAY, Disp: 180 tablet, Rfl: 0 ?  losartan (COZAAR) 100 MG tablet, Take 1 tablet by mouth once a day, Disp: 90 tablet, Rfl: 2 ?  omeprazole (PRILOSEC) 20 MG capsule, SMARTSIG:1 By Mouth, Disp: , Rfl:  ?  pantoprazole (PROTONIX) 20 MG tablet, Take 1 tablet by mouth once a day, Disp: 90 tablet, Rfl: 3 ?  sildenafil (VIAGRA) 50 MG tablet, Take half tablet by mouth as needed 30 minutes prior to encounter, if not successful then can increase to 1 tablet the next day., Disp: 6 tablet, Rfl: 0 ?  sodium bicarbonate  650 MG tablet, Take 1 tablet by mouth 2 times daily., Disp: 300 tablet, Rfl: 0 ?  valsartan (DIOVAN) 160 MG tablet, Take by mouth., Disp: , Rfl:  ? ?Allergies  ?Allergen Reactions  ? Nsaids   ?  Affects creatinine  ? Dilaudid [Hydromorphone Hcl] Other (See Comments)  ?  Suicidal  ? Sulfa Antibiotics   ?  Other reaction(s): GI Intolerance  ? Hydromorphone Anxiety  ?  Suicidal   ? ?Review of Systems ?Objective:  ?There were no vitals filed for this visit. ? ?General: Well developed, nourished, in no acute distress, alert and oriented x3  ? ?Dermatological: Skin is warm, dry and supple bilateral. Nails x 10 are well maintained; remaining integument appears unremarkable at this time. There are no open sores, no preulcerative lesions, no rash or signs of infection present.  He does have a distal clavus to the third digit of the right foot is similar finding on the third digit of the left foot the third  digit appears to be more callused and appears to be more arthritic than that of the left. ? ?Vascular: Dorsalis Pedis artery and Posterior Tibial artery pedal pulses are 2/4 bilateral with immedate capillary fill time. Pedal hair growth present. No varicosities and no lower extremity edema present bilateral.  ? ?Neruologic: Grossly intact via light touch bilateral. Vibratory intact via tuning fork bilateral. Protective threshold with Semmes Wienstein monofilament intact to all pedal sites bilateral. Patellar and Achilles deep tendon reflexes 2+ bilateral. No Babinski or clonus noted bilateral.  ? ?Musculoskeletal: No gross boney pedal deformities bilateral. No pain, crepitus, or limitation noted with foot and ankle range of motion bilateral. Muscular strength 5/5 in all groups tested bilateral.  Hammertoe deformities demonstrating early osteoarthritic changes. ? ?Gait: Unassisted, Nonantalgic.  ? ? ?Radiographs: ? ?None taken ? ?Assessment & Plan:  ? ?Assessment: Hammertoe deformities third digit bilateral right greater than left.  Distal clavus third digit right.  Short right leg. ? ?Plan: Debridement reactive hyperkeratotic tissue.  Placed padding and discussed continued care. ? ? ? ? ?Danaysia Rader T. [place]or, DPM ?

## 2022-02-18 DIAGNOSIS — M25561 Pain in right knee: Secondary | ICD-10-CM | POA: Diagnosis not present

## 2022-02-19 ENCOUNTER — Other Ambulatory Visit (HOSPITAL_COMMUNITY): Payer: Self-pay

## 2022-02-19 MED ORDER — BUPROPION HCL ER (SR) 200 MG PO TB12
400.0000 mg | ORAL_TABLET | Freq: Every day | ORAL | 0 refills | Status: DC
  Filled 2022-02-19: qty 180, 90d supply, fill #0

## 2022-02-26 DIAGNOSIS — E785 Hyperlipidemia, unspecified: Secondary | ICD-10-CM | POA: Diagnosis not present

## 2022-02-26 DIAGNOSIS — I10 Essential (primary) hypertension: Secondary | ICD-10-CM | POA: Diagnosis not present

## 2022-02-26 DIAGNOSIS — Z125 Encounter for screening for malignant neoplasm of prostate: Secondary | ICD-10-CM | POA: Diagnosis not present

## 2022-03-02 ENCOUNTER — Other Ambulatory Visit: Payer: Self-pay | Admitting: Family Medicine

## 2022-03-02 ENCOUNTER — Other Ambulatory Visit (HOSPITAL_COMMUNITY): Payer: Self-pay

## 2022-03-02 DIAGNOSIS — E785 Hyperlipidemia, unspecified: Secondary | ICD-10-CM | POA: Diagnosis not present

## 2022-03-02 DIAGNOSIS — I10 Essential (primary) hypertension: Secondary | ICD-10-CM | POA: Diagnosis not present

## 2022-03-02 MED ORDER — ATORVASTATIN CALCIUM 20 MG PO TABS
ORAL_TABLET | ORAL | 0 refills | Status: DC
  Filled 2022-03-02: qty 90, 90d supply, fill #0

## 2022-03-05 DIAGNOSIS — R053 Chronic cough: Secondary | ICD-10-CM | POA: Diagnosis not present

## 2022-03-05 DIAGNOSIS — M85821 Other specified disorders of bone density and structure, right upper arm: Secondary | ICD-10-CM | POA: Diagnosis not present

## 2022-03-05 DIAGNOSIS — N529 Male erectile dysfunction, unspecified: Secondary | ICD-10-CM | POA: Diagnosis not present

## 2022-03-05 DIAGNOSIS — I25118 Atherosclerotic heart disease of native coronary artery with other forms of angina pectoris: Secondary | ICD-10-CM | POA: Diagnosis not present

## 2022-03-05 DIAGNOSIS — D692 Other nonthrombocytopenic purpura: Secondary | ICD-10-CM | POA: Diagnosis not present

## 2022-03-05 DIAGNOSIS — I7121 Aneurysm of the ascending aorta, without rupture: Secondary | ICD-10-CM | POA: Diagnosis not present

## 2022-03-05 DIAGNOSIS — Z0000 Encounter for general adult medical examination without abnormal findings: Secondary | ICD-10-CM | POA: Diagnosis not present

## 2022-03-05 DIAGNOSIS — Z1331 Encounter for screening for depression: Secondary | ICD-10-CM | POA: Diagnosis not present

## 2022-03-05 DIAGNOSIS — N1831 Chronic kidney disease, stage 3a: Secondary | ICD-10-CM | POA: Diagnosis not present

## 2022-03-05 DIAGNOSIS — I10 Essential (primary) hypertension: Secondary | ICD-10-CM | POA: Diagnosis not present

## 2022-03-05 DIAGNOSIS — G4733 Obstructive sleep apnea (adult) (pediatric): Secondary | ICD-10-CM | POA: Diagnosis not present

## 2022-03-05 DIAGNOSIS — E785 Hyperlipidemia, unspecified: Secondary | ICD-10-CM | POA: Diagnosis not present

## 2022-03-09 ENCOUNTER — Other Ambulatory Visit (HOSPITAL_COMMUNITY): Payer: Self-pay

## 2022-03-09 DIAGNOSIS — Z96651 Presence of right artificial knee joint: Secondary | ICD-10-CM | POA: Diagnosis not present

## 2022-03-09 DIAGNOSIS — M25561 Pain in right knee: Secondary | ICD-10-CM | POA: Diagnosis not present

## 2022-03-09 MED ORDER — AMOXICILLIN-POT CLAVULANATE 875-125 MG PO TABS
ORAL_TABLET | ORAL | 0 refills | Status: DC
  Filled 2022-03-09: qty 20, 10d supply, fill #0

## 2022-03-11 ENCOUNTER — Other Ambulatory Visit (HOSPITAL_COMMUNITY): Payer: Self-pay

## 2022-03-18 DIAGNOSIS — L570 Actinic keratosis: Secondary | ICD-10-CM | POA: Diagnosis not present

## 2022-03-18 DIAGNOSIS — Z85828 Personal history of other malignant neoplasm of skin: Secondary | ICD-10-CM | POA: Diagnosis not present

## 2022-03-18 DIAGNOSIS — D1801 Hemangioma of skin and subcutaneous tissue: Secondary | ICD-10-CM | POA: Diagnosis not present

## 2022-03-18 DIAGNOSIS — L814 Other melanin hyperpigmentation: Secondary | ICD-10-CM | POA: Diagnosis not present

## 2022-03-18 DIAGNOSIS — D225 Melanocytic nevi of trunk: Secondary | ICD-10-CM | POA: Diagnosis not present

## 2022-03-18 DIAGNOSIS — D692 Other nonthrombocytopenic purpura: Secondary | ICD-10-CM | POA: Diagnosis not present

## 2022-03-18 DIAGNOSIS — L821 Other seborrheic keratosis: Secondary | ICD-10-CM | POA: Diagnosis not present

## 2022-03-19 ENCOUNTER — Other Ambulatory Visit (HOSPITAL_COMMUNITY): Payer: Self-pay

## 2022-03-19 MED ORDER — AMOXICILLIN-POT CLAVULANATE 875-125 MG PO TABS
1.0000 | ORAL_TABLET | Freq: Two times a day (BID) | ORAL | 0 refills | Status: DC
  Filled 2022-03-19: qty 4, 2d supply, fill #0

## 2022-03-24 ENCOUNTER — Encounter: Payer: Self-pay | Admitting: Adult Health

## 2022-03-24 ENCOUNTER — Ambulatory Visit (INDEPENDENT_AMBULATORY_CARE_PROVIDER_SITE_OTHER): Payer: Medicare Other | Admitting: Adult Health

## 2022-03-24 VITALS — BP 124/74 | HR 59 | Ht 70.0 in | Wt 212.0 lb

## 2022-03-24 DIAGNOSIS — Z9989 Dependence on other enabling machines and devices: Secondary | ICD-10-CM | POA: Diagnosis not present

## 2022-03-24 DIAGNOSIS — G4733 Obstructive sleep apnea (adult) (pediatric): Secondary | ICD-10-CM | POA: Diagnosis not present

## 2022-03-24 NOTE — Progress Notes (Signed)
[facility] [address] [place]. [place] [address] [phone]       OFFICE FOLLOW UP NOTE  Joseph Maldonado. Date of Birth:  07/11/1946 Medical Record Number:  [id number]   Reason for visit: CPAP follow-up    SUBJECTIVE:   CHIEF COMPLAINT:  Chief Complaint  Patient presents with   Obstructive Sleep Apnea    Rm 3 alone Pt is well and stable, had some mask leakage but got it resolved today    HPI:   Update 03/24/2022 JM: Patient returns for 1 year CPAP follow-up.  Previously seen by Dr. Rexene Alberts 02/04/2021.  He has had difficulty using machine for the past few months as his wife had a fall back in January and required shoulder surgery, he was up frequently throughout the night caring for her. He has been trying to get back on track using his machine more consistently. Has been having issues with leaks - has mask adjustment this morning at [facility]. Occasionally has difficulty taking a deep breath, will have to remove mask. Needs order for new supplies. ESS 3/24. No further concerns.          History from Dr. Guadelupe Sabin prior [place] note provided for reference purposes only Joseph Maldonado is a 76 year old right-handed gentleman with an underlying medical history of arthritis, with status post multiple joint replacement surgeries, chronic kidney disease, depression, hypertension, reflux disease, chronic cough and overweight state, who presents for follow-up consultation of his obstructive sleep apnea, established on CPAP therapy.  The patient is unaccompanied today. I saw him on 09/10/20, at which time he was compliant with his CPAP and residual AHI was at goal.  He was advised to follow-up in 1 year.  He called in the interim in late March reporting that his wife had noted increased apneas while he was on CPAP.  A compliance review indicated a borderline residual AHI of 6.1/h.  He was advised to continue with CPAP of 12 cm but requested additional review as he had noticed  increase in events on his machine, I therefore increased his set pressure to 13 cm.   Today, 02/04/2021: I reviewed his CPAP compliance data from 01/04/2021 through 02/02/2021, which is a total of 30 days, during which time he used his machine 29 days with percent use days greater than 4 hours at 83%, indicating very good compliance with an average usage of 6 hours and 31 minutes, residual AHI slightly borderline at 5.7/h, leak on the higher side with a 95th percentile at 30.9 mL/min, pressure of 13 cm with EPR of 1.  Leak numbers improved and that has 10 days.  He reports doing better recently.  He tried an AirTouch full facemask which did not work for him.  He switched back to his previous fullface mask and headgear and things are looking better, events are still fluctuating but have come down since the increase in pressure.  His wife from time to time notices an apnea and no significant snoring.  He is quite pleased with how things are going, however, has discomfort at night, some nights worse than others because of his shoulder arthritis.  He will eventually need a shoulder replacement but would like to hold off for now.  His range of motion is still fairly good he states, it is just painful.  He takes Tylenol 1 g twice daily on a day-to-day basis.  He has regular follow-up with his cardiologist for his aortic aneurysm.  He typically gets an echocardiogram every  other year now.  He gets regular labs through his primary care physician.  He does have chronic kidney impairment.   The patient's allergies, current medications, family history, past medical history, past social history, past surgical history and problem list were reviewed and updated as appropriate.    Previously:   I saw him in a virtual visit on 04/05/2019, at which time he was compliant with his CPAP and doing well.  He was advised to follow-up in 1 year.   I reviewed his CPAP compliance data from 08/11/2020 through 09/09/2020, which is a total  of 30 days, during which time he used his machine every night with percent use days greater than 4 hours at 80%, indicating very good compliance with an average usage of 5 hours and 9 minutes, residual AHI at goal at 3.8/h, leak on the high side with a 95th percentile at 24.6 L/min and a pressure of 12 cm with EPR of 1.    I saw him on 10/05/2018, at which time we talked about his sleep study results, he was compliant with CPAP and doing well, he felt improved with regards to his daytime energy level and nighttime sleep consolidation and sleep quality.  He had undergone right total hip replacement in November 2018 and had to cancel an interim follow-up appointment because of his surgery and also his dog needed surgery.  He had lost weight over time and was able to keep it off.  He was using a fullface mask but had tried another type of mask in the interim.     I reviewed his CPAP compliance data from 03/05/2019 through 04/03/2019 which is a total of 30 days, during which time he used his machine every night with percent use days greater than 4 hours at 90%, indicating excellent compliance with an average usage of 6 hours and 53 minutes, residual AHI borderline at 4.8/h, leak on the higher end with a 95th percentile at 19.9 L/min on a pressure of 12 cm with EPR of 1.       I first met him on 04/06/2018 at the request of his primary care physician, at which time he reported snoring and daytime somnolence. He was advised to proceed with a sleep study. He had a split-night sleep study on 05/22/2018. I went over his test results with him in detail today. Baseline sleep latency was delayed at 91.5 minutes and REM sleep was absent. He had a total AHI of 52.2 per hour, average oxygen saturation was 96%, nadir was 78%. He had no significant PLMS, no significant EKG changes. He was fitted with a large full facemask due to being a mouth breather and CPAP was titrated from 5 cm to 12 cm. On the final pressure his AHI was 0 per  hour, with supine non-REM sleep achieved an O2 nadir of 95%. Based on his test results I prescribed CPAP therapy for home use at a pressure of 12 cm.      I reviewed his CPAP compliance data from 09/04/2018 through 10/03/2018 which is a total of 30 days, during which time he used his CPAP every night with percent used days greater than 4 hours at 97%, indicating excellent compliance with an average usage of 7 hours and 17 minutes, residual AHI at goal at 3.8 per hour, leak on the higher end with the 95th percentile at 23.7 L/m on a pressure of 12 cm with EPR of 1.    04/06/2018: (He) reports snoring and excessive daytime  somnolence. His wife is noted pauses in his breathing while asleep. He saw Dr. Jaynee Eagles last year after a fall. I reviewed your office note from 01/24/2018 as well as 03/13/2018. He recently saw pulmonology for his chronic cough. His Epworth sleepiness score is 3 out of 24, fatigue score is 32 out of 63. He denies a family history of sleep apnea. He reports a family history of brain aneurysm in his mother and brain cancer in his father. He would like to have a brain MRI done but cannot have contrast because of chronic kidney disease. For his chronic cough he was recently started on medication by pulmonology including anti-histamine and reflux medicine. His wife reports that there are some nights where he snores very little and has no apneas but there are some other nights where he has louder snoring and breathing pauses as well as gasping sounds. He has nocturia about 2-3 times per average night and denies morning headaches. 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 They have 2 grown children, a daughter and a son. Wife reports that his family and she have noticed changes in his behavior and low frustration tolerance recently. Of note, patient as his intolerance is provoked. He quit smoking in 1970. He drinks less than 2 beers per week and has not had any alcohol in several weeks. He  drinks caffeine in the form of 2 large cups per day in the mornings. They have 2 dogs and 1 cat, dogs are often in the bedroom at night. His bedtime is between 10 and 11 PM, rise time around 7 or 7:30. He had a uvulectomy when he was in his 63s for snoring and he had a tonsillectomy as a child. He typically sleeps on his back because of bilateral shoulder pain. He had his left hip replaced as well as both knees.       ROS:   14 system review of systems performed and negative with exception of those listed in HPI  PMH:  Past Medical History:  Diagnosis Date   Arthritis    CAD (coronary artery disease)    Chronic kidney disease    Complication of anesthesia    reports " i lose my blood pressure after anesthesia; the drops usually happens in recovery"    Depression    Hand pain    currently being evaluated by rheumatology for dx    Hematuria    Hypertension    Rapid heart beat    rpeorts  " 2 months ago i had a rapid heart beat that lasted 45 min to an hour, ive experience this twice in the last t10 years but only seconds long" " i was evaluated by cardiology Dr Landry Mellow who did a 30 day monitor and ECHO and said i have occasional [PATS] ? , denies chest pain nor LOC  during these episodes; HR today is WDL    Sleep apnea    CPAP     PSH:  Past Surgical History:  Procedure Laterality Date   CATARACT EXTRACTION, BILATERAL     HIP SURGERY Left 2012   JOINT REPLACEMENT     REPLACEMENT TOTAL KNEE Right    2001,2006,2009,2013   REPLACEMENT TOTAL KNEE Left 2011   TOTAL HIP ARTHROPLASTY Right 08/14/2018   Procedure: TOTAL HIP ARTHROPLASTY ANTERIOR APPROACH;  Surgeon: Frederik Pear, MD;  Location: [facility];  Service: Orthopedics;  Laterality: Right;    Social History:  Social History   Socioeconomic History  Marital status: Married    Spouse name: Not on file   Number of children: 2   Years of education: Not on file   Highest education level: Bachelor's degree (e.g., BA, AB, BS)   Occupational History   Occupation: Retired  Tobacco Use   Smoking status: Former    Packs/day: 0.50    Years: 3.00    Total pack years: 1.50    Types: Cigarettes    Quit date: 1971    Years since quitting: 52.5   Smokeless tobacco: Never  Vaping Use   Vaping Use: Never used  Substance and Sexual Activity   Alcohol use: Yes    Alcohol/week: 3.0 standard drinks of alcohol    Types: 3 Cans of beer per week    Comment: max 3 beers/week with a meal.   Drug use: No    Comment: former user of marijuana, quit in 1971   Sexual activity: Yes  Other Topics Concern   Not on file  Social History Narrative   Lives with his wife.   Education: College   Exercise: No   Drinks about 2 large coffees per day   Right handed      Spent many years in [place].  Moved to [place] to be near their children.  Daughter lives in [place].  Their son has lived in the [place] area with plans to move to [place] in summer 2019.   Social Determinants of Health   Financial Resource Strain: Low Risk  (08/08/2017)   Overall Financial Resource Strain (CARDIA)    Difficulty of Paying Living Expenses: Not hard at all  Food Insecurity: No Food Insecurity (08/08/2017)   Hunger Vital Sign    Worried About Running Out of Food in the Last Year: Never true    Ran Out of Food in the Last Year: Never true  Transportation Needs: No Transportation Needs (08/08/2017)   PRAPARE - Hydrologist (Medical): No    Lack of Transportation (Non-Medical): No  Physical Activity: Inactive (08/08/2017)   Exercise Vital Sign    Days of Exercise per Week: 0 days    Minutes of Exercise per Session: 0 min  Stress: No Stress Concern Present (08/08/2017)   [place]    Feeling of Stress : Not at all  Social Connections: [place] (08/08/2017)   Social Connection and Isolation Panel [NHANES]    Frequency of Communication with  Friends and Family: More than three times a week    Frequency of Social Gatherings with Friends and Family: Once a week    Attends Religious Services: More than 4 times per year    Active Member of [employer] or Organizations: Yes    Attends Music therapist: More than 4 times per year    Marital Status: Married  Human resources officer Violence: Not At Risk (08/08/2017)   Humiliation, Afraid, Rape, and Kick questionnaire    Fear of Current or Ex-Partner: No    Emotionally Abused: No    Physically Abused: No    Sexually Abused: No    Family History:  Family History  Problem Relation Age of Onset   Cancer Mother        throat   Hypertension Mother    Heart disease Mother    Emphysema Mother    Hypertension Father    Cancer Father        Brain   Hypertension Sister  Hematuria Sister    Hematuria Brother     Medications:   Current Outpatient Medications on File Prior to Visit  Medication Sig Dispense Refill   acetaminophen (TYLENOL) 500 MG tablet Take by mouth.     aspirin EC 81 MG tablet Take 81 mg by mouth every other day.      atenolol (TENORMIN) 25 MG tablet TAKE 1 TABLET(25 MG) BY MOUTH DAILY 90 tablet 2   atenolol (TENORMIN) 25 MG tablet Take 1 tablet by mouth once a day 90 tablet 2   atorvastatin (LIPITOR) 20 MG tablet TAKE 1 TABLET BY MOUTH DAILY 90 tablet 0   benzonatate (TESSALON) 100 MG capsule Take 1 capsule by mouth 3 times daily for up to 14 days as needed for cough 20 capsule 1   buPROPion (WELLBUTRIN SR) 200 MG 12 hr tablet TAKE 2 TABLETS BY MOUTH EVERY DAY 180 tablet 0   losartan (COZAAR) 100 MG tablet Take 1 tablet by mouth once a day 90 tablet 2   omeprazole (PRILOSEC) 20 MG capsule SMARTSIG:1 By Mouth     pantoprazole (PROTONIX) 20 MG tablet Take 1 tablet by mouth once a day 90 tablet 3   sildenafil (VIAGRA) 50 MG tablet Take half tablet by mouth as needed 30 minutes prior to encounter, if not successful then can increase to 1 tablet the next day. 6  tablet 0   sodium bicarbonate 650 MG tablet Take 1 tablet by mouth 2 times daily. 300 tablet 0   No current facility-administered medications on file prior to visit.    Allergies:   Allergies  Allergen Reactions   Nsaids     Affects creatinine   Dilaudid [Hydromorphone Hcl] Other (See Comments)    Suicidal   Sulfa Antibiotics     Other reaction(s): GI Intolerance   Hydromorphone Anxiety    Suicidal       OBJECTIVE:  Physical Exam  Vitals:   03/24/22 1235  BP: 124/74  Pulse: (!) 59  Weight: 212 lb (96.2 kg)  Height: _0  (1.778 m)   Body mass index is 30.42 kg/m. No results found.  General: well developed, well nourished, pleasant elderly Caucasian male, seated, in no evident distress Head: head normocephalic and atraumatic.   Neck: supple with no carotid or supraclavicular bruits Cardiovascular: regular rate and rhythm, no murmurs Musculoskeletal: no deformity Skin:  no rash/petichiae Vascular:  Normal pulses all extremities   Neurologic Exam Mental Status: Awake and fully alert. Oriented to place and time. Recent and remote memory intact. Attention span, concentration and fund of knowledge appropriate. Mood and affect appropriate.  Cranial Nerves: Pupils equal, briskly reactive to light. Extraocular movements full without nystagmus. Visual fields full to confrontation. Hearing intact. Facial sensation intact. Face, tongue, palate moves normally and symmetrically.  Motor: Normal bulk and tone. Normal strength in all tested extremity muscles Sensory.: intact to touch , pinprick , position and vibratory sensation.  Coordination: Rapid alternating movements normal in all extremities. Finger-to-nose and heel-to-shin performed accurately bilaterally. Gait and Station: Arises from chair without difficulty. Stance is normal. Gait demonstrates normal stride length and balance without use of AD. Tandem walk and heel toe without difficulty.  Reflexes: 1+ and symmetric. Toes  downgoing.         ASSESSMENT: Joseph Maldonado. is a 76 y.o. year old male with a longstanding history of sleep apnea on CPAP     PLAN:  OSA on CPAP : discussed importance of nightly CPAP usage greater than 4  hours for optimal benefit and per insurance requirements. Reports occasional difficulty breathing in while using CPAP - as AHI well controlled at current setting, would recommend continued pressure setting but if symptoms persist or worsen, may need to consider making adjustments.  Order will be placed to obtain new supplies.      Follow up in 1 year or call earlier if needed   CC:  PCP: Sueanne Margarita, DO    I spent 24 minutes of face-to-face and non-face-to-face time with patient.  This included previsit chart review, lab review, study review, order entry, electronic health record documentation, patient education regarding sleep apnea with review and discussion of compliance report and answered all other questions to patient's satisfaction   Frann Rider, [facility]  [facility] [address] [place], [place] [address]  Phone [phone] Fax [phone] Note: This document was prepared with digital dictation and possible smart phrase technology. Any transcriptional errors that result from this process are unintentional.

## 2022-03-24 NOTE — Patient Instructions (Signed)
Please ensure you continue to try to increase your nightly usage of CPAP and ensuring greater than 4 hours per night for optimal benefit and for insurance purposes  Please let us know if your breathing in difficulty becomes more persistent or worsens and may have to consider making pressure adjustments at that time  Continue to follow with your DME company for any needed supplies or CPAP related concerns    Follow-up in 1 year or call earlier if needed

## 2022-03-29 ENCOUNTER — Other Ambulatory Visit (HOSPITAL_COMMUNITY): Payer: Self-pay

## 2022-03-29 MED ORDER — AZITHROMYCIN 250 MG PO TABS
ORAL_TABLET | ORAL | 0 refills | Status: DC
  Filled 2022-03-29: qty 6, 5d supply, fill #0

## 2022-03-31 ENCOUNTER — Telehealth: Payer: Self-pay | Admitting: Adult Health

## 2022-03-31 NOTE — Telephone Encounter (Signed)
Pt is calling because he need a prescription sent to [facility] so they will ship out his medication equipment. Pt would like a follow-up phone call from a nurse

## 2022-03-31 NOTE — Telephone Encounter (Signed)
I have sent a message to [facility] asking for order to be processed.

## 2022-04-01 ENCOUNTER — Other Ambulatory Visit (HOSPITAL_COMMUNITY): Payer: Self-pay

## 2022-04-01 MED ORDER — AZITHROMYCIN 250 MG PO TABS
ORAL_TABLET | ORAL | 0 refills | Status: DC
  Filled 2022-04-01: qty 6, 5d supply, fill #0

## 2022-05-03 ENCOUNTER — Encounter (HOSPITAL_COMMUNITY): Payer: Self-pay | Admitting: *Deleted

## 2022-05-03 ENCOUNTER — Ambulatory Visit (HOSPITAL_COMMUNITY)
Admission: EM | Admit: 2022-05-03 | Discharge: 2022-05-03 | Disposition: A | Discharge status: Home / Self Care | Payer: Medicare Other | Attending: Emergency Medicine | Admitting: Emergency Medicine

## 2022-05-03 ENCOUNTER — Other Ambulatory Visit (HOSPITAL_COMMUNITY): Payer: Self-pay

## 2022-05-03 DIAGNOSIS — U071 COVID-19: Secondary | ICD-10-CM | POA: Insufficient documentation

## 2022-05-03 DIAGNOSIS — J069 Acute upper respiratory infection, unspecified: Secondary | ICD-10-CM

## 2022-05-03 MED ORDER — DIPHENHYDRAMINE HCL 25 MG PO TABS
25.0000 mg | ORAL_TABLET | Freq: Four times a day (QID) | ORAL | 0 refills | Status: DC | PRN
  Filled 2022-05-03: qty 30, 8d supply, fill #0

## 2022-05-03 MED ORDER — BENZONATATE 100 MG PO CAPS
100.0000 mg | ORAL_CAPSULE | Freq: Three times a day (TID) | ORAL | 0 refills | Status: DC | PRN
  Filled 2022-05-03: qty 21, 7d supply, fill #0

## 2022-05-03 MED ORDER — PHENYLEPHRINE HCL 5 MG PO TABS
5.0000 mg | ORAL_TABLET | ORAL | 1 refills | Status: DC | PRN
  Filled 2022-05-03: qty 30, 5d supply, fill #0

## 2022-05-03 NOTE — Discharge Instructions (Addendum)
We will call you if the covid test returns positive. I recommend symptomatic care at home  Phenylephrine (nasal decongestant) you can take every 4 hours. This may slightly raise your blood pressure. Benadryl (diphenhydramine) works well as a decongestant and helps with sleep.  You can use the flonase nasal spray daily. I have sent in cough medicine, Tessalon, to take 3 times daily as needed.  Continue your tylenol every 6 hours as needed.  Increase your fluid intake as much as possible. Try a bland diet, this may help with diarrhea.

## 2022-05-03 NOTE — ED Provider Notes (Signed)
[facility]    CSN: [id number] Arrival date & time: 05/03/22  9563      History   Chief Complaint Chief Complaint  Patient presents with   Sore Throat   Diarrhea    HPI Joseph Maldonado. is a 76 y.o. male.  Presents with 3-day history of sore throat, congestion, cough.  Reports he just returned from a trip to [place] on Friday.  Symptoms developed shortly after.  Developed liquid stools this morning.  Decreased appetite.  Most concerning symptom is the congestion.  He has tried Tylenol Sinus.  Reports wife was recently sick with congestion and cough.  Denies any fever, chills, abdominal pain, nausea or vomiting.   Past Medical History:  Diagnosis Date   Arthritis    CAD (coronary artery disease)    Chronic kidney disease    Complication of anesthesia    reports " i lose my blood pressure after anesthesia; the drops usually happens in recovery"    Depression    Hand pain    currently being evaluated by rheumatology for dx    Hematuria    Hypertension    Rapid heart beat    rpeorts  " 2 months ago i had a rapid heart beat that lasted 45 min to an hour, ive experience this twice in the last t10 years but only seconds long" " i was evaluated by cardiology Dr Landry Mellow who did a 30 day monitor and ECHO and said i have occasional [PATS] ? , denies chest pain nor LOC  during these episodes; HR today is WDL    Sleep apnea    CPAP     Patient Active Problem List   Diagnosis Date Noted   OSA (obstructive sleep apnea) 06/01/2019   Aneurysm of ascending aorta ([place]) 05/18/2019   Coronary artery disease involving native coronary artery of native heart without angina pectoris 05/18/2019   Essential hypertension 05/18/2019   History of total hip arthroplasty, right 08/14/2018   Osteoarthritis of right hip 08/12/2018   Palpitations 01/29/2018   SCC (squamous cell carcinoma) 01/28/2018   Solitary pulmonary nodule 10/03/2016   Cough 09/29/2016   Seborrheic keratosis  02/26/2014   Rotator cuff tear arthropathy of right shoulder 01/10/2014   Knee joint replacement by other means 11/14/2012   S/P total knee arthroplasty 10/05/2011   Hip joint replacement by other means 03/01/2011   OA (osteoarthritis) of knee 03/01/2011   CKD (chronic kidney disease) 05/13/2010    Past Surgical History:  Procedure Laterality Date   CATARACT EXTRACTION, BILATERAL     HIP SURGERY Left 2012   JOINT REPLACEMENT     REPLACEMENT TOTAL KNEE Right    2001,2006,2009,2013   REPLACEMENT TOTAL KNEE Left 2011   TOTAL HIP ARTHROPLASTY Right 08/14/2018   Procedure: TOTAL HIP ARTHROPLASTY ANTERIOR APPROACH;  Surgeon: Frederik Pear, MD;  Location: [facility];  Service: Orthopedics;  Laterality: Right;       Home Medications    Prior to Admission medications   Medication Sig Start Date End Date Taking? Authorizing Provider  acetaminophen (TYLENOL) 500 MG tablet Take by mouth.   Yes [provider]  aspirin EC 81 MG tablet Take 81 mg by mouth every other day.    Yes [provider]  atenolol (TENORMIN) 25 MG tablet TAKE 1 TABLET(25 MG) BY MOUTH DAILY 02/26/21  Yes Wendie Agreste, MD  atenolol (TENORMIN) 25 MG tablet Take 1 tablet by mouth once a day 12/23/21  Yes   atorvastatin (  LIPITOR) 20 MG tablet TAKE 1 TABLET BY MOUTH DAILY 03/02/22  Yes Wendie Agreste, MD  azithromycin (ZITHROMAX) 250 MG tablet Take 2 tablets by mouth on day 1 and 1 tablet daily on days 2 through 5. 04/01/22  Yes   benzonatate (TESSALON) 100 MG capsule Take 1 capsule (100 mg total) by mouth 3 (three) times daily as needed for cough. 05/03/22  Yes Kelin Nixon, Wells Guiles, PA-C  buPROPion (WELLBUTRIN SR) 200 MG 12 hr tablet TAKE 2 TABLETS BY MOUTH EVERY DAY 02/19/22  Yes   diphenhydrAMINE (BENADRYL) 25 MG tablet Take 1 tablet (25 mg total) by mouth every 6 (six) hours as needed. 05/03/22  Yes Vickki Igou, Wells Guiles, PA-C  losartan (COZAAR) 100 MG tablet Take 1 tablet by mouth once a day 12/23/21  Yes   omeprazole  (PRILOSEC) 20 MG capsule SMARTSIG:1 By Mouth 09/30/21  Yes [provider]  pantoprazole (PROTONIX) 20 MG tablet Take 1 tablet by mouth once a day 11/09/21  Yes   Phenylephrine HCl 5 MG TABS Take 1 tablet (5 mg total) by mouth every 4 (four) hours as needed. 05/03/22  Yes Tomoya Ringwald, Wells Guiles, PA-C  sildenafil (VIAGRA) 50 MG tablet Take half tablet by mouth as needed 30 minutes prior to encounter, if not successful then can increase to 1 tablet the next day. 11/03/21  Yes   sodium bicarbonate 650 MG tablet Take 1 tablet by mouth 2 times daily. 10/14/21  Yes Rosita Fire, MD    Family History Family History  Problem Relation Age of Onset   Cancer Mother        throat   Hypertension Mother    Heart disease Mother    Emphysema Mother    Hypertension Father    Cancer Father        Brain   Hypertension Sister    Hematuria Sister    Hematuria Brother     Social History Social History   Tobacco Use   Smoking status: Former    Packs/day: 0.50    Years: 3.00    Total pack years: 1.50    Types: Cigarettes    Quit date: 1971    Years since quitting: 52.6   Smokeless tobacco: Never  Vaping Use   Vaping Use: Never used  Substance Use Topics   Alcohol use: Yes    Alcohol/week: 3.0 standard drinks of alcohol    Types: 3 Cans of beer per week    Comment: max 3 beers/week with a meal.   Drug use: No    Comment: former user of marijuana, quit in 1971     Allergies   Nsaids, Dilaudid [hydromorphone hcl], Sulfa antibiotics, and Hydromorphone   Review of Systems Review of Systems Per HPI  Physical Exam Triage Vital Signs ED Triage Vitals  Enc Vitals Group     BP 05/03/22 1022 118/68     Pulse Rate 05/03/22 1022 89     Resp 05/03/22 1022 18     Temp 05/03/22 1022 98.4 F (36.9 C)     Temp Source 05/03/22 1022 Oral     SpO2 05/03/22 1022 98 %     Weight --      Height --      Head Circumference --      Peak Flow --      Pain Score 05/03/22 1020 0     Pain Loc --       Pain Edu? --      Excl. in [place]? --  No data found.  Updated Vital Signs BP 118/68 (BP Location: Left Arm)   Pulse 89   Temp 98.4 F (36.9 C) (Oral)   Resp 18   SpO2 98%    Physical Exam Vitals and nursing note reviewed.  Constitutional:      General: He is not in acute distress. HENT:     Nose: Congestion present.     Mouth/Throat:     Mouth: Mucous membranes are moist.     Pharynx: Uvula midline. No posterior oropharyngeal erythema.     Tonsils: No tonsillar exudate or tonsillar abscesses.  Eyes:     Conjunctiva/sclera: Conjunctivae normal.     Pupils: Pupils are equal, round, and reactive to light.  Cardiovascular:     Rate and Rhythm: Normal rate and regular rhythm.     Heart sounds: Normal heart sounds.  Pulmonary:     Effort: Pulmonary effort is normal. No respiratory distress.     Breath sounds: Normal breath sounds. No wheezing.  Abdominal:     General: Bowel sounds are normal.     Palpations: Abdomen is soft.     Tenderness: There is no abdominal tenderness.  Musculoskeletal:     Cervical back: Normal range of motion.  Lymphadenopathy:     Cervical: No cervical adenopathy.  Neurological:     Mental Status: He is alert and oriented to person, place, and time.      UC Treatments / Results  Labs (all labs ordered are listed, but only abnormal results are displayed) Labs Reviewed  SARS CORONAVIRUS 2 (TAT 6-24 HRS)    EKG  Radiology No results found.  Procedures Procedures (including critical care time)  Medications Ordered in UC Medications - No data to display  Initial Impression / Assessment and Plan / UC Course  I have reviewed the triage vital signs and the nursing notes.  Pertinent labs & imaging results that were available during my care of the patient were reviewed by me and considered in my medical decision making (see chart for details).  COVID test pending.  Likely viral etiology.  He did just return from a trip, may have pick  something up in [place] or along his travels.  Recommend symptomatic care at home.  Sent phenylephrine and Benadryl to try for congestion.  He does have Flonase nasal spray at home, recommend he try daily.  If cough is persistent he can try the Tessalon.  Tylenol for any pain or fever.  Increase fluid intake and try bland diet.  Understands symptoms may take a few days to resolve.  Patient agrees to plan.  Return precautions were discussed.  Final Clinical Impressions(s) / UC Diagnoses   Final diagnoses:  Viral URI     Discharge Instructions      We will call you if the covid test returns positive. I recommend symptomatic care at home  Phenylephrine (nasal decongestant) you can take every 4 hours. This may slightly raise your blood pressure. Benadryl (diphenhydramine) works well as a decongestant and helps with sleep.  You can use the flonase nasal spray daily. I have sent in cough medicine, Tessalon, to take 3 times daily as needed.  Continue your tylenol every 6 hours as needed.  Increase your fluid intake as much as possible. Try a bland diet, this may help with diarrhea.     ED Prescriptions     Medication Sig Dispense Auth. Provider   Phenylephrine HCl 5 MG TABS Take 1 tablet (5 mg total) by mouth  every 4 (four) hours as needed. 30 tablet Janequa Kipnis, PA-C   diphenhydrAMINE (BENADRYL) 25 MG tablet Take 1 tablet (25 mg total) by mouth every 6 (six) hours as needed. 30 tablet Ziah Leandro, PA-C   benzonatate (TESSALON) 100 MG capsule Take 1 capsule (100 mg total) by mouth 3 (three) times daily as needed for cough. 21 capsule Mashayla Lavin, Wells Guiles, PA-C      PDMP not reviewed this encounter.   Morena Mckissack, Vernice Jefferson 05/03/22 1227

## 2022-05-03 NOTE — ED Triage Notes (Signed)
Patient complains of sore throat, cough started Friday. Diarrhea started at 3 am this morning.

## 2022-05-04 ENCOUNTER — Telehealth (HOSPITAL_COMMUNITY): Payer: Self-pay | Admitting: Emergency Medicine

## 2022-05-04 ENCOUNTER — Other Ambulatory Visit (HOSPITAL_COMMUNITY): Payer: Self-pay

## 2022-05-04 LAB — SARS CORONAVIRUS 2 (TAT 6-24 HRS): SARS Coronavirus 2: POSITIVE — AB

## 2022-05-04 MED ORDER — LAGEVRIO 200 MG PO CAPS
4.0000 | ORAL_CAPSULE | Freq: Two times a day (BID) | ORAL | 0 refills | Status: DC
  Filled 2022-05-04: qty 40, 5d supply, fill #0

## 2022-05-04 MED ORDER — NIRMATRELVIR/RITONAVIR (PAXLOVID)TABLET: 3.0000 | ORAL_TABLET | Freq: Two times a day (BID) | ORAL | 0 refills | Status: AC

## 2022-05-04 NOTE — Telephone Encounter (Signed)
Patient COVID-positive.  Called wondering about antivirals.  He is 72 with some risk factors including renal impairment.  I will leave it up to the patient if he wants to take the medicine or not, as he is already 5 days into his symptoms.  Unknown if it will have much benefit but will provide the option. Have sent with decreased dosing due to renal impairment.  Medicine at the pharmacy.

## 2022-05-10 ENCOUNTER — Other Ambulatory Visit (HOSPITAL_COMMUNITY): Payer: Self-pay

## 2022-05-10 MED ORDER — DOXYCYCLINE HYCLATE 100 MG PO CAPS
100.0000 mg | ORAL_CAPSULE | Freq: Two times a day (BID) | ORAL | 0 refills | Status: DC
  Filled 2022-05-10: qty 14, 7d supply, fill #0

## 2022-05-14 ENCOUNTER — Other Ambulatory Visit: Payer: Self-pay | Admitting: Family Medicine

## 2022-05-14 ENCOUNTER — Other Ambulatory Visit (HOSPITAL_COMMUNITY): Payer: Self-pay

## 2022-05-14 DIAGNOSIS — E785 Hyperlipidemia, unspecified: Secondary | ICD-10-CM

## 2022-05-14 MED ORDER — BUPROPION HCL ER (SR) 200 MG PO TB12
400.0000 mg | ORAL_TABLET | Freq: Every day | ORAL | 1 refills | Status: DC
  Filled 2022-05-14: qty 180, 90d supply, fill #0
  Filled 2022-08-11: qty 180, 90d supply, fill #1

## 2022-05-17 ENCOUNTER — Other Ambulatory Visit (HOSPITAL_COMMUNITY): Payer: Self-pay

## 2022-05-18 ENCOUNTER — Other Ambulatory Visit: Payer: Medicare Other

## 2022-05-18 ENCOUNTER — Other Ambulatory Visit (HOSPITAL_COMMUNITY): Payer: Self-pay

## 2022-05-19 ENCOUNTER — Other Ambulatory Visit (HOSPITAL_COMMUNITY): Payer: Self-pay

## 2022-05-20 ENCOUNTER — Other Ambulatory Visit (HOSPITAL_COMMUNITY): Payer: Self-pay

## 2022-05-20 MED ORDER — BENZONATATE 100 MG PO CAPS
ORAL_CAPSULE | ORAL | 0 refills | Status: DC
  Filled 2022-05-20: qty 21, 7d supply, fill #0

## 2022-05-21 ENCOUNTER — Ambulatory Visit: Payer: Medicare Other

## 2022-05-21 ENCOUNTER — Other Ambulatory Visit (HOSPITAL_COMMUNITY): Payer: Self-pay

## 2022-05-21 ENCOUNTER — Other Ambulatory Visit: Payer: Self-pay

## 2022-05-21 DIAGNOSIS — I7121 Aneurysm of the ascending aorta, without rupture: Secondary | ICD-10-CM | POA: Diagnosis not present

## 2022-05-25 ENCOUNTER — Other Ambulatory Visit (HOSPITAL_COMMUNITY): Payer: Self-pay

## 2022-05-25 DIAGNOSIS — R051 Acute cough: Secondary | ICD-10-CM | POA: Diagnosis not present

## 2022-05-25 DIAGNOSIS — Z8616 Personal history of COVID-19: Secondary | ICD-10-CM | POA: Diagnosis not present

## 2022-05-25 DIAGNOSIS — G4733 Obstructive sleep apnea (adult) (pediatric): Secondary | ICD-10-CM | POA: Diagnosis not present

## 2022-05-25 MED ORDER — PREDNISONE 20 MG PO TABS
ORAL_TABLET | ORAL | 0 refills | Status: DC
  Filled 2022-05-25: qty 7, 7d supply, fill #0

## 2022-05-28 ENCOUNTER — Encounter: Payer: Self-pay | Admitting: Cardiology

## 2022-05-28 ENCOUNTER — Ambulatory Visit: Payer: Medicare Other | Admitting: Internal Medicine

## 2022-05-28 VITALS — BP 148/74 | HR 57 | Temp 97.7°F | Resp 16 | Ht 70.0 in | Wt 209.0 lb

## 2022-05-28 DIAGNOSIS — I2510 Atherosclerotic heart disease of native coronary artery without angina pectoris: Secondary | ICD-10-CM

## 2022-05-28 DIAGNOSIS — I10 Essential (primary) hypertension: Secondary | ICD-10-CM

## 2022-05-28 DIAGNOSIS — I7121 Aneurysm of the ascending aorta, without rupture: Secondary | ICD-10-CM | POA: Diagnosis not present

## 2022-05-28 NOTE — Progress Notes (Signed)
 Follow up visit  Subjective:   Joseph Hinostroza Jr., male    DOB: 07/07/1946, 75 y.o.   MRN: [id number]   Chief Complaint  Patient presents with   Hypertension   Aneurysm of ascending aorta    Follow-up    HPI  75 y/o Caucasian male with controlled hypertension, h/o OSA s/p uvulectomy, ascending aorta aneurysm with Bovine arch, palpitations, here for follow up.  Patient is doing well from cardiac standpoint. He denies chest pain, shortness of breath, palpitations, leg edema, orthopnea, PND, TIA/syncope. Blood pressure generally well controlled, mildly elevated today. Reviewed recent test results with the patient, details below.     Current Outpatient Medications:    acetaminophen (TYLENOL) 500 MG tablet, Take by mouth., Disp: , Rfl:    aspirin EC 81 MG tablet, Take 81 mg by mouth every other day. , Disp: , Rfl:    atenolol (TENORMIN) 25 MG tablet, TAKE 1 TABLET(25 MG) BY MOUTH DAILY, Disp: 90 tablet, Rfl: 2   atenolol (TENORMIN) 25 MG tablet, Take 1 tablet by mouth once a day, Disp: 90 tablet, Rfl: 2   atorvastatin (LIPITOR) 20 MG tablet, TAKE 1 TABLET BY MOUTH DAILY, Disp: 90 tablet, Rfl: 0   benzonatate (TESSALON) 100 MG capsule, Take 1 capsule (100 mg total) by mouth 3 (three) times daily as needed for cough., Disp: 21 capsule, Rfl: 0   benzonatate (TESSALON) 100 MG capsule, Take 1 capsule by mouth as needed 3 times a day as needed, caution --sedation., Disp: 21 capsule, Rfl: 0   buPROPion (WELLBUTRIN SR) 200 MG 12 hr tablet, TAKE 2 TABLETS BY MOUTH EVERY DAY, Disp: 180 tablet, Rfl: 1   diphenhydrAMINE (BENADRYL) 25 MG tablet, Take 1 tablet (25 mg total) by mouth every 6 (six) hours as needed., Disp: 30 tablet, Rfl: 0   losartan (COZAAR) 100 MG tablet, Take 1 tablet by mouth once a day, Disp: 90 tablet, Rfl: 2   omeprazole (PRILOSEC) 20 MG capsule, SMARTSIG:1 By Mouth, Disp: , Rfl:    pantoprazole (PROTONIX) 20 MG tablet, Take 1 tablet by mouth once a day, Disp: 90 tablet, Rfl:  3   Phenylephrine HCl 5 MG TABS, Take 1 tablet (5 mg total) by mouth every 4 (four) hours as needed., Disp: 30 tablet, Rfl: 1   predniSONE (DELTASONE) 20 MG tablet, Take 1 tablet by mouth once daily with breakfast for 7 days, Disp: 7 tablet, Rfl: 0   sodium bicarbonate 650 MG tablet, Take 1 tablet by mouth 2 times daily., Disp: 300 tablet, Rfl: 0  Cardiovascular studies:  Echocardiogram 05/21/2022:  Normal LV systolic function with visual EF 60-65%. Left ventricle cavity  is normal in size. Mild concentric remodeling of the left ventricle.  Normal global wall motion. Normal diastolic filling pattern, normal LAP.  Mild left ventricular hypertrophy  Native trileaflet aortic valve. No evidence of aortic stenosis. Trace  aortic regurgitation.  Trace tricuspid regurgitation. No evidence of pulmonary hypertension.  The aortic root is dilated at 42mm.  Proximal ascending aorta is not well  visualized.  Compared to 05/04/2021: Grade I DD is now normal, moderate AR is now  trivial, aortic root was 38mm now 42mm.   EKG 05/29/2021: Sinus rhythm 54 bpm IVCD  CTA chest and coronary CTA 06/20/2018: Calcium score 888 LM: Normal LAD: 50% or less calcified plaque in prox and mid LAD      <50% calcific plaque distally      D1: <50% ostial calcific plaque        D2: Normal LCx: <50% calcific plaque prox and mid vessel      OM1: <50% calcific plaque      AV grove: Normal RCA: <50% calcific plaque prox, mid, and distal vessel.      PDA: 50% calcific plaque on PLB and PDA  Aortic root: dilated Sinus: 3.7 cm STJ: 3.3 cm Aortic root: 4.2 cm Descending thoracic aorta 2.7 cm  Impression: Calcium score 80th percentile Nonobstructive three vessel CAD Aortic root dilatation 4.2 cm with Bovine arch  Echocardiogram 04/19/2018: Left ventricle cavity is normal in size. Mild concentric hypertrophy of the left ventricle. Normal global wall motion. Calculated EF 69%. Normal diastolic filling  pattern. Calcification seen on noncoronary cusp. Moderate (Grade II) aortic regurgitation. Mild tricuspid regurgitation.  No evidence of pulmonary hypertension.  The aortic root is dilated, measuring 4.0 cm at sinotubular junction.  Event monitor 02/17/2018 - 03/18/2018: Sinus rhythm.  No arrhythmias.  No symptoms reported.  Recent labs: 2023: Glucose 87, BUN/Cr 45/1.5. EGFR 49. Chol 132, TG 62, HDL 51, LDL 69   Review of Systems  Cardiovascular:  Negative for chest pain, dyspnea on exertion, leg swelling, palpitations and syncope.        Vitals:   05/28/22 0930  BP: (!) 148/74  Pulse: (!) 57  Resp: 16  Temp: 97.7 F (36.5 C)  SpO2: 98%    Body mass index is 29.99 kg/m. Filed Weights   05/28/22 0930  Weight: 209 lb (94.8 kg)     Objective:   Physical Exam Vitals and nursing note reviewed.  Constitutional:      General: He is not in acute distress. Neck:     Vascular: No JVD.  Cardiovascular:     Rate and Rhythm: Normal rate and regular rhythm.     Heart sounds: Normal heart sounds. No murmur heard. Pulmonary:     Effort: Pulmonary effort is normal.     Breath sounds: Normal breath sounds. No wheezing or rales.  Musculoskeletal:     Right lower leg: No edema.     Left lower leg: No edema.          Assessment & Recommendations:   76 y/o Caucasian male with controlled hypertension, h/o OSA s/p uvulectomy, ascending aorta aneurysm, palpitations, here for follow up.  Ascending aorta aneurysm, grade II AI: Aortic root 3.8-4.2 cm. Stable Continue on losartan 100 mg, atenolol 25 mg daily. Repeat echocardiogram 04/2023.  Nonobstructive CAD: Continue aspirin/statin.  Hypertension: Well controlled on atenolol 25 mg, losartan 100 mg.  CKD: F/u w/nephrology.  Pulmonary nodule: Reportedly, known to the patient. Follow up with Dr. Carlota Raspberry.  F/u in 1 year after echcoardiogram  Nigel Mormon, MD [facility] Pager:  [phone] Office: [phone] If no answer Cell [phone]

## 2022-05-28 NOTE — Progress Notes (Deleted)
Follow up visit  Subjective:   Joseph Tissue., male    DOB: 1946-05-12, 76 y.o.   MRN: [id number]   Chief Complaint  Patient presents with  . Hypertension  . Aneurysm of ascending aorta   . Follow-up    Hypertension Pertinent negatives include no chest pain or palpitations.   76 y/o Caucasian male with controlled hypertension, h/o OSA s/p uvulectomy, ascending aorta aneurysm with Bovine arch, palpitations, here for follow up.  Patient is doing well from cardiac standpoint. He denies chest pain, shortness of breath, palpitations, leg edema, orthopnea, PND, TIA/syncope. Blood pressure generally well controlled, mildly elevated today.    Current Outpatient Medications on File Prior to Visit  Medication Sig Dispense Refill  . acetaminophen (TYLENOL) 500 MG tablet Take by mouth.    Marland Kitchen aspirin EC 81 MG tablet Take 81 mg by mouth every other day.     Marland Kitchen atenolol (TENORMIN) 25 MG tablet TAKE 1 TABLET(25 MG) BY MOUTH DAILY 90 tablet 2  . atenolol (TENORMIN) 25 MG tablet Take 1 tablet by mouth once a day 90 tablet 2  . atorvastatin (LIPITOR) 20 MG tablet TAKE 1 TABLET BY MOUTH DAILY 90 tablet 0  . benzonatate (TESSALON) 100 MG capsule Take 1 capsule (100 mg total) by mouth 3 (three) times daily as needed for cough. 21 capsule 0  . benzonatate (TESSALON) 100 MG capsule Take 1 capsule by mouth as needed 3 times a day as needed, caution --sedation. 21 capsule 0  . buPROPion (WELLBUTRIN SR) 200 MG 12 hr tablet TAKE 2 TABLETS BY MOUTH EVERY DAY 180 tablet 1  . diphenhydrAMINE (BENADRYL) 25 MG tablet Take 1 tablet (25 mg total) by mouth every 6 (six) hours as needed. 30 tablet 0  . losartan (COZAAR) 100 MG tablet Take 1 tablet by mouth once a day 90 tablet 2  . omeprazole (PRILOSEC) 20 MG capsule SMARTSIG:1 By Mouth    . pantoprazole (PROTONIX) 20 MG tablet Take 1 tablet by mouth once a day 90 tablet 3  . Phenylephrine HCl 5 MG TABS Take 1 tablet (5 mg total) by mouth every 4 (four) hours as  needed. 30 tablet 1  . predniSONE (DELTASONE) 20 MG tablet Take 1 tablet by mouth once daily with breakfast for 7 days 7 tablet 0  . sodium bicarbonate 650 MG tablet Take 1 tablet by mouth 2 times daily. 300 tablet 0   No current facility-administered medications on file prior to visit.    Cardiovascular studies:  EKG 05/29/2021: Sinus rhythm 54 bpm IVCD  Echocardiogram 05/04/2021:  Left ventricle cavity is normal in size and wall thickness. Normal global  wall motion. LV endocardial border difficult to visualize. LV function  probably normal around 55-60%. Doppler evidence of grade I (impaired)  diastolic dysfunction, normal LAP.  The aortic root is upper limit normal at 3.8 cm.  Trileaflet aortic valve. Mild aortic valve leaflet calcification.    Moderate (Grade II) aortic regurgitation.  Mild tricuspid regurgitation.  No evidence of pulmonary hypertension.  Previous study in 2020 measured aortic root at 4.2 cm. No other  significant change noted.   EKG 05/19/2020: Sinus rhythm 58 bpm Incomplete RBBB  CTA chest and coronary CTA 06/20/2018: Calcium score 888 LM: Normal LAD: 50% or less calcified plaque in prox and mid LAD      <50% calcific plaque distally      D1: <50% ostial calcific plaque      D2: Normal LCx: <50% calcific plaque prox  and mid vessel      OM1: <50% calcific plaque      AV grove: Normal RCA: <50% calcific plaque prox, mid, and distal vessel.      PDA: 50% calcific plaque on PLB and PDA  Aortic root: dilated Sinus: 3.7 cm STJ: 3.3 cm Aortic root: 4.2 cm Descending thoracic aorta 2.7 cm  Impression: Calcium score 80th percentile Nonobstructive three vessel CAD Aortic root dilatation 4.2 cm with Bovine arch  Echocardiogram 04/19/2018: Left ventricle cavity is normal in size. Mild concentric hypertrophy of the left ventricle. Normal global wall motion. Calculated EF 69%. Normal diastolic filling pattern. Calcification seen on noncoronary cusp.  Moderate (Grade II) aortic regurgitation. Mild tricuspid regurgitation.  No evidence of pulmonary hypertension.  The aortic root is dilated, measuring 4.0 cm at sinotubular junction.  Event monitor 02/17/2018 - 03/18/2018: Sinus rhythm.  No arrhythmias.  No symptoms reported.  Recent labs: 01/28/2020: Glucose 102, BUN/Cr 36/1.7. EGFR 39. Na/K 139/4.8.  H/H 13/40. MCV 92. Platelets 182  06/11/2019: Chol 117, TG 96, HDL 54, LDL 45  06/16/2018: Glucose 95. BUN/Cr 28/1.22. eGFR 60. Na/K 141/4.7   Review of Systems  Cardiovascular:  Negative for chest pain, dyspnea on exertion, leg swelling, palpitations and syncope.       Vitals:   05/28/22 0930  BP: (!) 148/74  Pulse: (!) 57  Resp: 16  Temp: 97.7 F (36.5 C)  SpO2: 98%    Body mass index is 29.99 kg/m. Filed Weights   05/28/22 0930  Weight: 209 lb (94.8 kg)     Objective:   Physical Exam Vitals and nursing note reviewed.  Constitutional:      General: He is not in acute distress. Neck:     Vascular: No JVD.  Cardiovascular:     Rate and Rhythm: Normal rate and regular rhythm.     Heart sounds: Normal heart sounds. No murmur heard. Pulmonary:     Effort: Pulmonary effort is normal.     Breath sounds: Normal breath sounds. No wheezing or rales.  Musculoskeletal:     Right lower leg: No edema.     Left lower leg: No edema.          Assessment & Recommendations:   76 y/o Caucasian male with controlled hypertension, h/o OSA s/p uvulectomy, ascending aorta aneurysm, palpitations, here for follow up.  Ascending aorta aneurysm, grade II AI: Aortic root 3.8-4.2 cm. Stable Continue on losartan 100 mg, atenolol 25 mg daily.  Nonobstructive CAD: Continue aspirin/statin.  Hypertension: Well controlled on atenolol 25 mg, losartan 100 mg.  CKD: F/u w/nephrology.  Pulmonary nodule: Reportedly, known to the patient. Follow up with Dr. Carlota Raspberry.  F/u in 1 year after echcoardiogram  Nigel Mormon,  MD [facility] Pager: [phone] Office: [phone] If no answer Cell [phone]

## 2022-06-01 ENCOUNTER — Other Ambulatory Visit (HOSPITAL_COMMUNITY): Payer: Self-pay

## 2022-06-01 MED ORDER — BENZONATATE 100 MG PO CAPS
ORAL_CAPSULE | ORAL | 0 refills | Status: DC
  Filled 2022-06-01: qty 15, 7d supply, fill #0

## 2022-06-08 ENCOUNTER — Other Ambulatory Visit (HOSPITAL_COMMUNITY): Payer: Self-pay

## 2022-06-11 ENCOUNTER — Other Ambulatory Visit (HOSPITAL_COMMUNITY): Payer: Self-pay

## 2022-06-11 DIAGNOSIS — J40 Bronchitis, not specified as acute or chronic: Secondary | ICD-10-CM | POA: Diagnosis not present

## 2022-06-11 DIAGNOSIS — E785 Hyperlipidemia, unspecified: Secondary | ICD-10-CM | POA: Diagnosis not present

## 2022-06-11 DIAGNOSIS — G4733 Obstructive sleep apnea (adult) (pediatric): Secondary | ICD-10-CM | POA: Diagnosis not present

## 2022-06-11 DIAGNOSIS — K219 Gastro-esophageal reflux disease without esophagitis: Secondary | ICD-10-CM | POA: Diagnosis not present

## 2022-06-11 DIAGNOSIS — R053 Chronic cough: Secondary | ICD-10-CM | POA: Diagnosis not present

## 2022-06-11 MED ORDER — BENZONATATE 100 MG PO CAPS
100.0000 mg | ORAL_CAPSULE | Freq: Three times a day (TID) | ORAL | 0 refills | Status: DC
  Filled 2022-06-11: qty 30, 10d supply, fill #0

## 2022-06-11 MED ORDER — ATORVASTATIN CALCIUM 20 MG PO TABS
20.0000 mg | ORAL_TABLET | Freq: Every day | ORAL | 3 refills | Status: DC
  Filled 2022-06-11: qty 90, 90d supply, fill #0
  Filled 2022-08-11 – 2022-09-05 (×2): qty 90, 90d supply, fill #1
  Filled 2022-11-16: qty 90, 90d supply, fill #2
  Filled 2022-12-23 – 2023-02-17 (×2): qty 90, 90d supply, fill #3

## 2022-06-12 ENCOUNTER — Other Ambulatory Visit (HOSPITAL_COMMUNITY): Payer: Self-pay

## 2022-06-21 ENCOUNTER — Other Ambulatory Visit (HOSPITAL_COMMUNITY): Payer: Self-pay

## 2022-06-28 ENCOUNTER — Other Ambulatory Visit (HOSPITAL_COMMUNITY): Payer: Self-pay

## 2022-06-28 MED ORDER — BENZONATATE 100 MG PO CAPS
100.0000 mg | ORAL_CAPSULE | Freq: Three times a day (TID) | ORAL | 0 refills | Status: DC | PRN
  Filled 2022-06-28: qty 30, 10d supply, fill #0

## 2022-06-29 ENCOUNTER — Other Ambulatory Visit (HOSPITAL_COMMUNITY): Payer: Self-pay

## 2022-07-10 ENCOUNTER — Other Ambulatory Visit (HOSPITAL_COMMUNITY): Payer: Self-pay

## 2022-07-12 ENCOUNTER — Other Ambulatory Visit (HOSPITAL_COMMUNITY): Payer: Self-pay

## 2022-07-12 MED ORDER — BENZONATATE 100 MG PO CAPS
100.0000 mg | ORAL_CAPSULE | Freq: Three times a day (TID) | ORAL | 0 refills | Status: DC | PRN
  Filled 2022-07-12: qty 30, 10d supply, fill #0

## 2022-07-16 DIAGNOSIS — Z23 Encounter for immunization: Secondary | ICD-10-CM | POA: Diagnosis not present

## 2022-07-22 DIAGNOSIS — D23112 Other benign neoplasm of skin of right lower eyelid, including canthus: Secondary | ICD-10-CM | POA: Diagnosis not present

## 2022-07-22 DIAGNOSIS — Z961 Presence of intraocular lens: Secondary | ICD-10-CM | POA: Diagnosis not present

## 2022-08-04 DIAGNOSIS — R053 Chronic cough: Secondary | ICD-10-CM | POA: Diagnosis not present

## 2022-08-04 DIAGNOSIS — Z87891 Personal history of nicotine dependence: Secondary | ICD-10-CM | POA: Diagnosis not present

## 2022-08-04 DIAGNOSIS — Z885 Allergy status to narcotic agent status: Secondary | ICD-10-CM | POA: Diagnosis not present

## 2022-08-04 DIAGNOSIS — J383 Other diseases of vocal cords: Secondary | ICD-10-CM | POA: Diagnosis not present

## 2022-08-04 DIAGNOSIS — Z882 Allergy status to sulfonamides status: Secondary | ICD-10-CM | POA: Diagnosis not present

## 2022-08-04 DIAGNOSIS — Z886 Allergy status to analgesic agent status: Secondary | ICD-10-CM | POA: Diagnosis not present

## 2022-08-04 DIAGNOSIS — Z8616 Personal history of COVID-19: Secondary | ICD-10-CM | POA: Diagnosis not present

## 2022-08-04 DIAGNOSIS — R490 Dysphonia: Secondary | ICD-10-CM | POA: Diagnosis not present

## 2022-08-04 DIAGNOSIS — J387 Other diseases of larynx: Secondary | ICD-10-CM | POA: Diagnosis not present

## 2022-08-06 DIAGNOSIS — Z23 Encounter for immunization: Secondary | ICD-10-CM | POA: Diagnosis not present

## 2022-08-11 ENCOUNTER — Other Ambulatory Visit: Payer: Self-pay | Admitting: Family Medicine

## 2022-08-11 ENCOUNTER — Other Ambulatory Visit (HOSPITAL_COMMUNITY): Payer: Self-pay

## 2022-08-11 DIAGNOSIS — E785 Hyperlipidemia, unspecified: Secondary | ICD-10-CM

## 2022-08-11 MED ORDER — ATENOLOL 25 MG PO TABS
25.0000 mg | ORAL_TABLET | Freq: Every day | ORAL | 3 refills | Status: DC
  Filled 2022-08-11 – 2022-09-05 (×2): qty 90, 90d supply, fill #0
  Filled 2022-11-23: qty 90, 90d supply, fill #1

## 2022-08-11 MED ORDER — LOSARTAN POTASSIUM 100 MG PO TABS
100.0000 mg | ORAL_TABLET | Freq: Every day | ORAL | 3 refills | Status: DC
  Filled 2022-08-11 – 2022-09-05 (×2): qty 90, 90d supply, fill #0
  Filled 2022-11-16: qty 90, 90d supply, fill #1

## 2022-08-12 ENCOUNTER — Other Ambulatory Visit (HOSPITAL_COMMUNITY): Payer: Self-pay

## 2022-08-13 ENCOUNTER — Other Ambulatory Visit (HOSPITAL_COMMUNITY): Payer: Self-pay

## 2022-08-25 DIAGNOSIS — L570 Actinic keratosis: Secondary | ICD-10-CM | POA: Diagnosis not present

## 2022-08-25 DIAGNOSIS — D485 Neoplasm of uncertain behavior of skin: Secondary | ICD-10-CM | POA: Diagnosis not present

## 2022-08-25 DIAGNOSIS — D225 Melanocytic nevi of trunk: Secondary | ICD-10-CM | POA: Diagnosis not present

## 2022-08-25 DIAGNOSIS — Z85828 Personal history of other malignant neoplasm of skin: Secondary | ICD-10-CM | POA: Diagnosis not present

## 2022-08-25 DIAGNOSIS — L821 Other seborrheic keratosis: Secondary | ICD-10-CM | POA: Diagnosis not present

## 2022-08-25 DIAGNOSIS — L814 Other melanin hyperpigmentation: Secondary | ICD-10-CM | POA: Diagnosis not present

## 2022-08-26 ENCOUNTER — Other Ambulatory Visit (HOSPITAL_COMMUNITY): Payer: Self-pay

## 2022-09-02 DIAGNOSIS — M85821 Other specified disorders of bone density and structure, right upper arm: Secondary | ICD-10-CM | POA: Diagnosis not present

## 2022-09-02 DIAGNOSIS — I7121 Aneurysm of the ascending aorta, without rupture: Secondary | ICD-10-CM | POA: Diagnosis not present

## 2022-09-02 DIAGNOSIS — D692 Other nonthrombocytopenic purpura: Secondary | ICD-10-CM | POA: Diagnosis not present

## 2022-09-02 DIAGNOSIS — M959 Acquired deformity of musculoskeletal system, unspecified: Secondary | ICD-10-CM | POA: Diagnosis not present

## 2022-09-02 DIAGNOSIS — S22000A Wedge compression fracture of unspecified thoracic vertebra, initial encounter for closed fracture: Secondary | ICD-10-CM | POA: Diagnosis not present

## 2022-09-02 DIAGNOSIS — I25118 Atherosclerotic heart disease of native coronary artery with other forms of angina pectoris: Secondary | ICD-10-CM | POA: Diagnosis not present

## 2022-09-02 DIAGNOSIS — G4733 Obstructive sleep apnea (adult) (pediatric): Secondary | ICD-10-CM | POA: Diagnosis not present

## 2022-09-02 DIAGNOSIS — R053 Chronic cough: Secondary | ICD-10-CM | POA: Diagnosis not present

## 2022-09-02 DIAGNOSIS — E785 Hyperlipidemia, unspecified: Secondary | ICD-10-CM | POA: Diagnosis not present

## 2022-09-02 DIAGNOSIS — M1990 Unspecified osteoarthritis, unspecified site: Secondary | ICD-10-CM | POA: Diagnosis not present

## 2022-09-02 DIAGNOSIS — N1831 Chronic kidney disease, stage 3a: Secondary | ICD-10-CM | POA: Diagnosis not present

## 2022-09-02 DIAGNOSIS — I10 Essential (primary) hypertension: Secondary | ICD-10-CM | POA: Diagnosis not present

## 2022-09-06 ENCOUNTER — Other Ambulatory Visit (HOSPITAL_COMMUNITY): Payer: Self-pay

## 2022-09-29 ENCOUNTER — Other Ambulatory Visit (HOSPITAL_COMMUNITY): Payer: Self-pay

## 2022-09-29 MED ORDER — SODIUM BICARBONATE 650 MG PO TABS
650.0000 mg | ORAL_TABLET | Freq: Two times a day (BID) | ORAL | 3 refills | Status: DC
  Filled 2022-09-29: qty 180, 90d supply, fill #0
  Filled 2022-11-23 – 2022-12-23 (×2): qty 180, 90d supply, fill #1
  Filled 2023-02-17 – 2023-04-06 (×2): qty 180, 90d supply, fill #2
  Filled 2023-06-28: qty 180, 90d supply, fill #3

## 2022-10-28 ENCOUNTER — Other Ambulatory Visit (HOSPITAL_COMMUNITY): Payer: Self-pay

## 2022-10-28 DIAGNOSIS — L570 Actinic keratosis: Secondary | ICD-10-CM | POA: Diagnosis not present

## 2022-10-28 DIAGNOSIS — Z85828 Personal history of other malignant neoplasm of skin: Secondary | ICD-10-CM | POA: Diagnosis not present

## 2022-10-28 DIAGNOSIS — B078 Other viral warts: Secondary | ICD-10-CM | POA: Diagnosis not present

## 2022-10-28 DIAGNOSIS — L280 Lichen simplex chronicus: Secondary | ICD-10-CM | POA: Diagnosis not present

## 2022-10-28 MED ORDER — DESONIDE 0.05 % EX OINT
TOPICAL_OINTMENT | CUTANEOUS | 2 refills | Status: DC
  Filled 2022-10-28: qty 15, 30d supply, fill #0
  Filled 2022-11-23: qty 15, 30d supply, fill #1

## 2022-10-29 ENCOUNTER — Other Ambulatory Visit (HOSPITAL_COMMUNITY): Payer: Self-pay

## 2022-11-09 ENCOUNTER — Other Ambulatory Visit (HOSPITAL_COMMUNITY): Payer: Self-pay

## 2022-11-23 ENCOUNTER — Other Ambulatory Visit (HOSPITAL_COMMUNITY): Payer: Self-pay

## 2022-11-23 ENCOUNTER — Other Ambulatory Visit: Payer: Self-pay

## 2022-11-23 ENCOUNTER — Other Ambulatory Visit: Payer: Self-pay | Admitting: Family Medicine

## 2022-11-23 DIAGNOSIS — E785 Hyperlipidemia, unspecified: Secondary | ICD-10-CM

## 2022-11-23 MED ORDER — PANTOPRAZOLE SODIUM 20 MG PO TBEC
20.0000 mg | DELAYED_RELEASE_TABLET | Freq: Every day | ORAL | 3 refills | Status: DC
  Filled 2022-11-23: qty 90, 90d supply, fill #0
  Filled 2023-02-17: qty 90, 90d supply, fill #1
  Filled 2023-05-31: qty 90, 90d supply, fill #2
  Filled 2023-09-26: qty 90, 90d supply, fill #3

## 2022-11-23 MED ORDER — BUPROPION HCL ER (SR) 200 MG PO TB12
400.0000 mg | ORAL_TABLET | Freq: Every day | ORAL | 1 refills | Status: DC
  Filled 2022-11-23: qty 180, 90d supply, fill #0

## 2022-11-24 ENCOUNTER — Other Ambulatory Visit (HOSPITAL_COMMUNITY): Payer: Self-pay

## 2022-11-30 ENCOUNTER — Other Ambulatory Visit (HOSPITAL_COMMUNITY): Payer: Self-pay

## 2022-11-30 ENCOUNTER — Other Ambulatory Visit: Payer: Self-pay | Admitting: Internal Medicine

## 2022-11-30 DIAGNOSIS — D692 Other nonthrombocytopenic purpura: Secondary | ICD-10-CM | POA: Diagnosis not present

## 2022-11-30 DIAGNOSIS — R413 Other amnesia: Secondary | ICD-10-CM

## 2022-11-30 DIAGNOSIS — M85821 Other specified disorders of bone density and structure, right upper arm: Secondary | ICD-10-CM | POA: Diagnosis not present

## 2022-11-30 DIAGNOSIS — I10 Essential (primary) hypertension: Secondary | ICD-10-CM | POA: Diagnosis not present

## 2022-11-30 DIAGNOSIS — N1831 Chronic kidney disease, stage 3a: Secondary | ICD-10-CM | POA: Diagnosis not present

## 2022-11-30 DIAGNOSIS — I700 Atherosclerosis of aorta: Secondary | ICD-10-CM | POA: Diagnosis not present

## 2022-11-30 DIAGNOSIS — I7121 Aneurysm of the ascending aorta, without rupture: Secondary | ICD-10-CM | POA: Diagnosis not present

## 2022-11-30 DIAGNOSIS — E785 Hyperlipidemia, unspecified: Secondary | ICD-10-CM | POA: Diagnosis not present

## 2022-11-30 DIAGNOSIS — I25118 Atherosclerotic heart disease of native coronary artery with other forms of angina pectoris: Secondary | ICD-10-CM | POA: Diagnosis not present

## 2022-11-30 DIAGNOSIS — R053 Chronic cough: Secondary | ICD-10-CM | POA: Diagnosis not present

## 2022-11-30 DIAGNOSIS — E875 Hyperkalemia: Secondary | ICD-10-CM | POA: Diagnosis not present

## 2022-11-30 DIAGNOSIS — M1990 Unspecified osteoarthritis, unspecified site: Secondary | ICD-10-CM | POA: Diagnosis not present

## 2022-11-30 MED ORDER — BUPROPION HCL ER (SR) 100 MG PO TB12
100.0000 mg | ORAL_TABLET | Freq: Two times a day (BID) | ORAL | 1 refills | Status: DC
  Filled 2022-11-30: qty 60, 30d supply, fill #0
  Filled 2022-12-23: qty 60, 30d supply, fill #1

## 2022-12-01 ENCOUNTER — Other Ambulatory Visit (HOSPITAL_COMMUNITY): Payer: Self-pay

## 2022-12-02 ENCOUNTER — Other Ambulatory Visit (HOSPITAL_COMMUNITY): Payer: Self-pay

## 2022-12-02 MED ORDER — HYDROCHLOROTHIAZIDE 12.5 MG PO TABS
ORAL_TABLET | ORAL | 1 refills | Status: DC
  Filled 2022-12-02: qty 90, 90d supply, fill #0
  Filled 2022-12-23: qty 90, 90d supply, fill #1

## 2022-12-02 MED ORDER — LOSARTAN POTASSIUM 50 MG PO TABS
ORAL_TABLET | ORAL | 1 refills | Status: DC
  Filled 2022-12-02: qty 90, 90d supply, fill #0
  Filled 2022-12-23: qty 90, 90d supply, fill #1

## 2022-12-14 ENCOUNTER — Ambulatory Visit
Admission: RE | Admit: 2022-12-14 | Discharge: 2022-12-14 | Disposition: A | Discharge status: Home / Self Care | Payer: Medicare Other | Source: Ambulatory Visit | Attending: Orthopaedic Surgery | Admitting: Orthopaedic Surgery

## 2022-12-14 ENCOUNTER — Other Ambulatory Visit: Payer: Self-pay | Admitting: Orthopaedic Surgery

## 2022-12-14 DIAGNOSIS — Z01818 Encounter for other preprocedural examination: Secondary | ICD-10-CM

## 2022-12-14 DIAGNOSIS — G8929 Other chronic pain: Secondary | ICD-10-CM | POA: Diagnosis not present

## 2022-12-14 DIAGNOSIS — M25511 Pain in right shoulder: Secondary | ICD-10-CM | POA: Diagnosis not present

## 2022-12-14 DIAGNOSIS — M25411 Effusion, right shoulder: Secondary | ICD-10-CM | POA: Diagnosis not present

## 2022-12-14 DIAGNOSIS — M19011 Primary osteoarthritis, right shoulder: Secondary | ICD-10-CM | POA: Diagnosis not present

## 2022-12-15 ENCOUNTER — Telehealth: Payer: Self-pay

## 2022-12-15 DIAGNOSIS — H903 Sensorineural hearing loss, bilateral: Secondary | ICD-10-CM | POA: Diagnosis not present

## 2022-12-15 DIAGNOSIS — Z461 Encounter for fitting and adjustment of hearing aid: Secondary | ICD-10-CM | POA: Diagnosis not present

## 2022-12-15 NOTE — Telephone Encounter (Signed)
Received surgery clearance form from Raliegh Ip for [place] to complete. Will place forms in [address] in basket.

## 2022-12-16 ENCOUNTER — Encounter: Payer: Self-pay | Admitting: Cardiology

## 2022-12-16 NOTE — Telephone Encounter (Signed)
Surgical clearance form was faxed to [facility] and received confirmation that it went through

## 2022-12-16 NOTE — Telephone Encounter (Signed)
Signed and placed in outbox.  Thank you. ?

## 2022-12-23 ENCOUNTER — Other Ambulatory Visit (HOSPITAL_COMMUNITY): Payer: Self-pay

## 2022-12-23 ENCOUNTER — Ambulatory Visit
Admission: RE | Admit: 2022-12-23 | Discharge: 2022-12-23 | Disposition: A | Discharge status: Home / Self Care | Payer: Medicare Other | Source: Ambulatory Visit | Attending: Internal Medicine | Admitting: Internal Medicine

## 2022-12-23 ENCOUNTER — Other Ambulatory Visit: Payer: Self-pay

## 2022-12-23 DIAGNOSIS — R413 Other amnesia: Secondary | ICD-10-CM | POA: Diagnosis not present

## 2022-12-23 MED ORDER — GADOPICLENOL 0.5 MMOL/ML IV SOLN
10.0000 mL | Freq: Once | INTRAVENOUS | Status: AC | PRN
  Administered 2022-12-23: 10 mL via INTRAVENOUS

## 2022-12-24 ENCOUNTER — Other Ambulatory Visit: Payer: Self-pay

## 2022-12-28 DIAGNOSIS — E785 Hyperlipidemia, unspecified: Secondary | ICD-10-CM | POA: Diagnosis not present

## 2023-01-06 DIAGNOSIS — N529 Male erectile dysfunction, unspecified: Secondary | ICD-10-CM | POA: Diagnosis not present

## 2023-01-06 DIAGNOSIS — M85821 Other specified disorders of bone density and structure, right upper arm: Secondary | ICD-10-CM | POA: Diagnosis not present

## 2023-01-06 DIAGNOSIS — I129 Hypertensive chronic kidney disease with stage 1 through stage 4 chronic kidney disease, or unspecified chronic kidney disease: Secondary | ICD-10-CM | POA: Diagnosis not present

## 2023-01-06 DIAGNOSIS — R053 Chronic cough: Secondary | ICD-10-CM | POA: Diagnosis not present

## 2023-01-06 DIAGNOSIS — R413 Other amnesia: Secondary | ICD-10-CM | POA: Diagnosis not present

## 2023-01-06 DIAGNOSIS — M1990 Unspecified osteoarthritis, unspecified site: Secondary | ICD-10-CM | POA: Diagnosis not present

## 2023-01-06 DIAGNOSIS — I7121 Aneurysm of the ascending aorta, without rupture: Secondary | ICD-10-CM | POA: Diagnosis not present

## 2023-01-06 DIAGNOSIS — E875 Hyperkalemia: Secondary | ICD-10-CM | POA: Diagnosis not present

## 2023-01-06 DIAGNOSIS — N1831 Chronic kidney disease, stage 3a: Secondary | ICD-10-CM | POA: Diagnosis not present

## 2023-01-06 DIAGNOSIS — M959 Acquired deformity of musculoskeletal system, unspecified: Secondary | ICD-10-CM | POA: Diagnosis not present

## 2023-01-06 DIAGNOSIS — I25118 Atherosclerotic heart disease of native coronary artery with other forms of angina pectoris: Secondary | ICD-10-CM | POA: Diagnosis not present

## 2023-01-06 DIAGNOSIS — I700 Atherosclerosis of aorta: Secondary | ICD-10-CM | POA: Diagnosis not present

## 2023-01-13 ENCOUNTER — Encounter (HOSPITAL_BASED_OUTPATIENT_CLINIC_OR_DEPARTMENT_OTHER): Payer: Self-pay | Admitting: Orthopaedic Surgery

## 2023-01-14 ENCOUNTER — Encounter (HOSPITAL_BASED_OUTPATIENT_CLINIC_OR_DEPARTMENT_OTHER)
Admission: RE | Admit: 2023-01-14 | Discharge: 2023-01-14 | Disposition: A | Discharge status: Home / Self Care | Payer: Medicare Other | Source: Ambulatory Visit | Attending: Orthopaedic Surgery | Admitting: Orthopaedic Surgery

## 2023-01-14 DIAGNOSIS — Z01818 Encounter for other preprocedural examination: Secondary | ICD-10-CM | POA: Diagnosis not present

## 2023-01-14 LAB — BASIC METABOLIC PANEL
Anion gap: 9 (ref 5–15)
BUN: 33 mg/dL — ABNORMAL HIGH (ref 8–23)
CO2: 23 mmol/L (ref 22–32)
Calcium: 9.8 mg/dL (ref 8.9–10.3)
Chloride: 107 mmol/L (ref 98–111)
Creatinine, Ser: 1.5 mg/dL — ABNORMAL HIGH (ref 0.61–1.24)
GFR, Estimated: 48 mL/min — ABNORMAL LOW (ref >60)
Glucose, Bld: 94 mg/dL (ref 70–99)
Potassium: 3.9 mmol/L (ref 3.5–5.1)
Sodium: 139 mmol/L (ref 135–145)

## 2023-01-14 LAB — SURGICAL PCR SCREEN
MRSA, PCR: NEGATIVE
Staphylococcus aureus: POSITIVE — AB

## 2023-01-14 NOTE — Progress Notes (Signed)
Sherry at Dr Austin Miles office notified that we will need cards clearance for this patient.

## 2023-01-18 NOTE — Discharge Instructions (Addendum)
Ramond Marrow MD, MPH Alfonse Alpers, PA-C [facility] [address], [address] [phone] (tel)   [phone] (fax)   POST-OPERATIVE INSTRUCTIONS - TOTAL SHOULDER REPLACEMENT    WOUND CARE You may leave the operative dressing in place until your follow-up appointment. KEEP THE INCISIONS CLEAN AND DRY. There may be a small amount of fluid/bleeding leaking at the surgical site. This is normal after surgery.  If it fills with liquid or blood please call us immediately to change it for you. Use the provided ice machine or Ice packs as often as possible for the first 3-4 days, then as needed for pain relief.   Keep a layer of cloth or a shirt between your skin and the cooling unit to prevent frost bite as it can get very cold.  SHOWERING: - You may shower on Post-Op Day #2.  - The dressing is water resistant but do not scrub it as it may start to peel up.   - You may remove the sling for showering - Gently pat the area dry.  - Do not soak the shoulder in water.  - Do not go swimming in the pool or ocean until your incision has completely healed (about 4-6 weeks after surgery) - KEEP THE INCISIONS CLEAN AND DRY.  EXERCISES Wear the sling at all times  You may remove the sling for showering, but keep the arm across the chest or in a secondary sling.    Accidental/Purposeful External Rotation and shoulder flexion (reaching behind you) is to be avoided at all costs for the first month. It is ok to come out of your sling if your are sitting and have assistance for eating.   Do not lift anything heavier than 1 pound until we discuss it further in clinic.  It is normal for your fingers/hand to become more swollen after surgery and discolored from bruising.   This will resolve over the first few weeks usually after surgery. Please continue to ambulate and do not stay sitting or lying for too long.  Perform foot and wrist pumps to assist in circulation.  PHYSICAL  THERAPY - You will begin physical therapy soon after surgery (unless otherwise specified) - Please call to set up an appointment, if you do not already have one  - Let our office if there are any issues with scheduling your therapy  - You have a physical therapy appointment scheduled at [facility] (across the hall from our office) on Monday 4/29 at 11 am   REGIONAL ANESTHESIA (NERVE BLOCKS) The anesthesia team may have performed a nerve block for you this is a great tool used to minimize pain.   The block may start wearing off overnight (between 8-24 hours postop) When the block wears off, your pain may go from nearly zero to the pain you would have had postop without the block. This is an abrupt transition but nothing dangerous is happening.   This can be a challenging period but utilize your as needed pain medications to try and manage this period. We suggest you use the pain medication the first night prior to going to bed, to ease this transition.  You may take an extra dose of narcotic when this happens if needed   POST-OP MEDICATIONS- Multimodal approach to pain control In general your pain will be controlled with a combination of substances.  Prescriptions unless otherwise discussed are electronically sent to your pharmacy.  This is a carefully made plan we use to minimize narcotic  use.     Acetaminophen - Non-narcotic pain medicine taken on a scheduled basis  Gabapentin - this is to help with nerve based pain, take on a scheduled basis Oxycodone - This is a strong narcotic, to be used only on an "as needed" basis for SEVERE pain. Tizanidine - this is is a muscle relaxer, take as needed for muscle spasms Aspirin - this is for DVT prophylaxis after surgery Zofran -  take as needed for nausea   FOLLOW-UP If you develop a Fever (>101.5), Redness or Drainage from the surgical incision site, please call our office to arrange for an evaluation. Please call the office to schedule a  follow-up appointment for a wound check, 7-10 days post-operatively.  IF YOU HAVE ANY QUESTIONS, PLEASE FEEL FREE TO CALL OUR OFFICE.  HELPFUL INFORMATION  Your arm will be in a sling following surgery. You will be in this sling for the next 4 weeks.   You may be more comfortable sleeping in a semi-seated position the first few nights following surgery.  Keep a pillow propped under the elbow and forearm for comfort.  If you have a recliner type of chair it might be beneficial.  If not that is fine too, but it would be helpful to sleep propped up with pillows behind your operated shoulder as well under your elbow and forearm.  This will reduce pulling on the suture lines.  When dressing, put your operative arm in the sleeve first.  When getting undressed, take your operative arm out last.  Loose fitting, button-down shirts are recommended.  In most states it is against the law to drive while your arm is in a sling. And certainly against the law to drive while taking narcotics.  You may return to work/school in the next couple of days when you feel up to it. Desk work and typing in the sling is fine.  We suggest you use the pain medication the first night prior to going to bed, in order to ease any pain when the anesthesia wears off. You should avoid taking pain medications on an empty stomach as it will make you nauseous.  You should wean off your narcotic medicines as soon as you are able.     Most patients will be off or using minimal narcotics before their first postop appointment.   Do not drink alcoholic beverages or take illicit drugs when taking pain medications.  Pain medication may make you constipated.  Below are a few solutions to try in this order: Decrease the amount of pain medication if you aren't having pain. Drink lots of decaffeinated fluids. Drink prune juice and/or each dried prunes  If the first 3 don't work start with additional solutions Take Colace - an  over-the-counter stool softener Take Senokot - an over-the-counter laxative Take Miralax - a stronger over-the-counter laxative   Dental Antibiotics:  In most cases prophylactic antibiotics for Dental procdeures after total joint surgery are not necessary.  Exceptions are as follows:  1. History of prior total joint infection  2. Severely immunocompromised (Organ Transplant, cancer chemotherapy, Rheumatoid biologic meds such as Humera)  3. Poorly controlled diabetes (A1C &gt; 8.0, blood glucose over 200)  If you have one of these conditions, contact your surgeon for an antibiotic prescription, prior to your dental procedure.   For more information including helpful videos and documents visit our website:   [email or web address]

## 2023-01-18 NOTE — H&P (Signed)
PREOPERATIVE H&P  Chief Complaint: RIGHT SHOULDER DJD  HPI: Joseph Maldonado. is a 77 y.o. male who is scheduled for Procedure(s): REVERSE SHOULDER ARTHROPLASTY.   Patient has a past medical history significant for CAD, HTN, sleep apnea on CPAP.   Patient has had cuff tear arthropathy that has been managed non-operatively.  He has a history of CKD and is unable to take NSAIDs.  He thinks it is related to pain medication he had during surgery.  He is rather functional with his right shoulder.  He has continued pain.    Symptoms are rated as moderate to severe, and have been worsening.  This is significantly impairing activities of daily living.    Please see clinic note for further details on this patient's care.    He has elected for surgical management.   Past Medical History:  Diagnosis Date   Arthritis    CAD (coronary artery disease)    Chronic kidney disease    Complication of anesthesia    reports " i lose my blood pressure after anesthesia; the drops usually happens in recovery"    Depression    Hand pain    currently being evaluated by rheumatology for dx    Hematuria    Hypertension    Rapid heart beat    rpeorts  " 2 months ago i had a rapid heart beat that lasted 45 min to an hour, ive experience this twice in the last t10 years but only seconds long" " i was evaluated by cardiology Dr Marilynne Halsted who did a 30 day monitor and ECHO and said i have occasional [PATS] ? , denies chest pain nor LOC  during these episodes; HR today is WDL    Sleep apnea    CPAP    Past Surgical History:  Procedure Laterality Date   CATARACT EXTRACTION, BILATERAL     HIP SURGERY Left 2012   JOINT REPLACEMENT     REPLACEMENT TOTAL KNEE Right    2001,2006,2009,2013   REPLACEMENT TOTAL KNEE Left 2011   TOTAL HIP ARTHROPLASTY Right 08/14/2018   Procedure: TOTAL HIP ARTHROPLASTY ANTERIOR APPROACH;  Surgeon: Gean Birchwood, MD;  Location: [facility];  Service: Orthopedics;  Laterality: Right;    Social History   Socioeconomic History   Marital status: Married    Spouse name: Not on file   Number of children: 2   Years of education: Not on file   Highest education level: Bachelor's degree (e.g., BA, AB, BS)  Occupational History   Occupation: Retired  Tobacco Use   Smoking status: Former    Packs/day: 0.50    Years: 3.00    Additional pack years: 0.00    Total pack years: 1.50    Types: Cigarettes    Quit date: 1971    Years since quitting: 53.3   Smokeless tobacco: Never  Vaping Use   Vaping Use: Never used  Substance and Sexual Activity   Alcohol use: Yes    Alcohol/week: 3.0 standard drinks of alcohol    Types: 3 Cans of beer per week    Comment: max 3 beers/week with a meal.   Drug use: No    Comment: former user of marijuana, quit in 1971   Sexual activity: Yes  Other Topics Concern   Not on file  Social History Narrative   Lives with his wife.   Education: College   Exercise: No   Drinks about 2 large coffees per day   Right handed  Spent many years in [place].  Moved to [place] to be near their children.  Daughter lives in [place].  Their son has lived in the [place] area with plans to move to [place] in summer 2019.   Social Determinants of Health   Financial Resource Strain: Low Risk  (08/08/2017)   Overall Financial Resource Strain (CARDIA)    Difficulty of Paying Living Expenses: Not hard at all  Food Insecurity: No Food Insecurity (08/08/2017)   Hunger Vital Sign    Worried About Running Out of Food in the Last Year: Never true    Ran Out of Food in the Last Year: Never true  Transportation Needs: No Transportation Needs (08/08/2017)   PRAPARE - Administrator, Civil Service (Medical): No    Lack of Transportation (Non-Medical): No  Physical Activity: Inactive (08/08/2017)   Exercise Vital Sign    Days of Exercise per Week: 0 days    Minutes of Exercise per Session: 0 min  Stress: No Stress Concern Present (08/08/2017)    [employer] of Occupational Health - Occupational Stress Questionnaire    Feeling of Stress : Not at all  Social Connections: Socially Integrated (08/08/2017)   Social Connection and Isolation Panel [NHANES]    Frequency of Communication with Friends and Family: More than three times a week    Frequency of Social Gatherings with Friends and Family: Once a week    Attends Religious Services: More than 4 times per year    Active Member of [employer] or Organizations: Yes    Attends Engineer, structural: More than 4 times per year    Marital Status: Married   Family History  Problem Relation Age of Onset   Cancer Mother        throat   Hypertension Mother    Heart disease Mother    Emphysema Mother    Hypertension Father    Cancer Father        Brain   Hypertension Sister    Hematuria Sister    Hematuria Brother    Allergies  Allergen Reactions   Nsaids     Affects creatinine   Dilaudid [Hydromorphone Hcl] Other (See Comments)    Suicidal   Sulfa Antibiotics     Other reaction(s): GI Intolerance   Prior to Admission medications   Medication Sig Start Date End Date Taking? Authorizing Provider  atenolol (TENORMIN) 25 MG tablet TAKE 1 TABLET(25 MG) BY MOUTH DAILY 02/26/21  Yes Shade Flood, MD  atorvastatin (LIPITOR) 20 MG tablet Take 1 tablet (20 mg total) by mouth daily. 06/11/22  Yes   buPROPion ER (WELLBUTRIN SR) 100 MG 12 hr tablet Take 1 tablet daily and in 3 days increase as tolerated,  increase to 1 tab twice daily 11/30/22  Yes   hydrochlorothiazide (HYDRODIURIL) 12.5 MG tablet Take 1 tablet by mouth once daily in the morning 12/02/22  Yes   losartan (COZAAR) 50 MG tablet Take 1 tablet by mouth once daily 12/02/22  Yes   pantoprazole (PROTONIX) 20 MG tablet Take 1 tablet (20 mg total) by mouth daily. 11/23/22  Yes   sodium bicarbonate 650 MG tablet Take 1 tablet (650 mg total) by mouth 2 (two) times daily. 09/29/22  Yes   diphenhydrAMINE (BENADRYL) 25 MG tablet  Take 1 tablet (25 mg total) by mouth every 6 (six) hours as needed. 05/03/22   Rising, Rebecca, PA-C    ROS: All other systems have been reviewed and were otherwise  negative with the exception of those mentioned in the HPI and as above.  Physical Exam: General: Alert, no acute distress Cardiovascular: No pedal edema Respiratory: No cyanosis, no use of accessory musculature GI: No organomegaly, abdomen is soft and non-tender Skin: No lesions in the area of chief complaint Neurologic: Sensation intact distally Psychiatric: Patient is competent for consent with normal mood and affect Lymphatic: No axillary or cervical lymphadenopathy  MUSCULOSKELETAL:  Range of motion of the shoulder about 130 degrees with scapular isolation, passive to 135.  External rotation is fixed at a -20 contracture.  He has internal rotation to his back pocket.    Imaging: X-rays of the shoulder, three views, demonstrate end stage erosion of the joint with [place] 4B rotator cuff arthropathy and erosion of the acromion.    BMI: Estimated body mass index is 29.41 kg/m as calculated from the following:   Height as of this encounter: 5\' 10"  (1.778 m).   Weight as of this encounter: 93 kg.  Lab Results  Component Value Date   ALBUMIN 4.2 02/26/2021   Diabetes: Patient does not have a diagnosis of diabetes.     Smoking Status:       Assessment: RIGHT SHOULDER DJD  Plan: Plan for Procedure(s): REVERSE SHOULDER ARTHROPLASTY  Patient's shoulder is still thankfully reconstructable; however, he is at a point where he needs to have surgery or he might miss an opportunity.  The risks, benefits and alternatives of a reverse total shoulder arthroplasty with an augment was discussed. We will plan for [facility] with an overnight bed reserved just in case he has any issues with blood pressure.  I think this will be unlikely considering his nerve block.  He additionally will bring his CPAP machine.  If he cannot  do NSAIDs we will have to adjust his pain medications postoperatively.  We gave him a sling today and an information sheet.    The risks benefits and alternatives were discussed with the patient including but not limited to the risks of nonoperative treatment, versus surgical intervention including infection, bleeding, nerve injury,  blood clots, cardiopulmonary complications, morbidity, mortality, among others, and they were willing to proceed.   We additionally specifically discussed risks of axillary nerve injury, infection, periprosthetic fracture, continued pain and longevity of implants prior to beginning procedure.    Patient will be closely monitored in PACU for medical stabilization and pain control. If found stable in PACU, patient may be discharged home with outpatient follow-up. If any concerns regarding patient's stabilization patient will be admitted for observation after surgery. The patient is planning to be discharged home with outpatient PT.   The patient acknowledged the explanation, agreed to proceed with the plan and consent was signed.   He received operative clearance from his PCP, Dr. Thornell Mule, nephrologist, Dr. Ronalee Belts, neurologist, Ihor Austin NP, and cardiologist, Dr. Rosemary Holms.  Operative Plan: Right reverse total shoulder arthroplasty Discharge Medications: standard (avoid NSAIDs) DVT Prophylaxis: aspirin vs xarelto Physical Therapy: outpatient PT Special Discharge needs: Sling. IceMan   Vernetta Honey, PA-C  01/18/2023 4:34 PM

## 2023-01-20 ENCOUNTER — Other Ambulatory Visit: Payer: Self-pay

## 2023-01-20 ENCOUNTER — Ambulatory Visit (HOSPITAL_COMMUNITY): Payer: Medicare Other

## 2023-01-20 ENCOUNTER — Encounter (HOSPITAL_BASED_OUTPATIENT_CLINIC_OR_DEPARTMENT_OTHER): Payer: Self-pay | Admitting: Orthopaedic Surgery

## 2023-01-20 ENCOUNTER — Encounter (HOSPITAL_BASED_OUTPATIENT_CLINIC_OR_DEPARTMENT_OTHER)
Admission: RE | Disposition: A | Discharge status: Home / Self Care | Payer: Self-pay | Source: Home / Self Care | Attending: Orthopaedic Surgery

## 2023-01-20 ENCOUNTER — Ambulatory Visit (HOSPITAL_BASED_OUTPATIENT_CLINIC_OR_DEPARTMENT_OTHER): Payer: Medicare Other | Admitting: Anesthesiology

## 2023-01-20 ENCOUNTER — Other Ambulatory Visit (HOSPITAL_COMMUNITY): Payer: Self-pay

## 2023-01-20 ENCOUNTER — Ambulatory Visit (HOSPITAL_BASED_OUTPATIENT_CLINIC_OR_DEPARTMENT_OTHER)
Admission: RE | Admit: 2023-01-20 | Discharge: 2023-01-21 | Disposition: A | Discharge status: Home / Self Care | Payer: Medicare Other | Attending: Orthopaedic Surgery | Admitting: Orthopaedic Surgery

## 2023-01-20 DIAGNOSIS — M25711 Osteophyte, right shoulder: Secondary | ICD-10-CM | POA: Insufficient documentation

## 2023-01-20 DIAGNOSIS — G4730 Sleep apnea, unspecified: Secondary | ICD-10-CM | POA: Insufficient documentation

## 2023-01-20 DIAGNOSIS — M12811 Other specific arthropathies, not elsewhere classified, right shoulder: Secondary | ICD-10-CM

## 2023-01-20 DIAGNOSIS — Z96611 Presence of right artificial shoulder joint: Secondary | ICD-10-CM | POA: Diagnosis not present

## 2023-01-20 DIAGNOSIS — M75101 Unspecified rotator cuff tear or rupture of right shoulder, not specified as traumatic: Secondary | ICD-10-CM

## 2023-01-20 DIAGNOSIS — I129 Hypertensive chronic kidney disease with stage 1 through stage 4 chronic kidney disease, or unspecified chronic kidney disease: Secondary | ICD-10-CM | POA: Diagnosis not present

## 2023-01-20 DIAGNOSIS — I2510 Atherosclerotic heart disease of native coronary artery without angina pectoris: Secondary | ICD-10-CM | POA: Insufficient documentation

## 2023-01-20 DIAGNOSIS — Z87891 Personal history of nicotine dependence: Secondary | ICD-10-CM | POA: Diagnosis not present

## 2023-01-20 DIAGNOSIS — I10 Essential (primary) hypertension: Secondary | ICD-10-CM | POA: Diagnosis not present

## 2023-01-20 DIAGNOSIS — Z9989 Dependence on other enabling machines and devices: Secondary | ICD-10-CM

## 2023-01-20 DIAGNOSIS — M19011 Primary osteoarthritis, right shoulder: Secondary | ICD-10-CM | POA: Diagnosis not present

## 2023-01-20 DIAGNOSIS — N189 Chronic kidney disease, unspecified: Secondary | ICD-10-CM | POA: Insufficient documentation

## 2023-01-20 DIAGNOSIS — G4733 Obstructive sleep apnea (adult) (pediatric): Secondary | ICD-10-CM

## 2023-01-20 DIAGNOSIS — Z01818 Encounter for other preprocedural examination: Secondary | ICD-10-CM

## 2023-01-20 DIAGNOSIS — Z79899 Other long term (current) drug therapy: Secondary | ICD-10-CM | POA: Diagnosis not present

## 2023-01-20 DIAGNOSIS — Z471 Aftercare following joint replacement surgery: Secondary | ICD-10-CM | POA: Diagnosis not present

## 2023-01-20 DIAGNOSIS — G8918 Other acute postprocedural pain: Secondary | ICD-10-CM | POA: Diagnosis not present

## 2023-01-20 HISTORY — PX: REVERSE SHOULDER ARTHROPLASTY: SHX5054

## 2023-01-20 SURGERY — ARTHROPLASTY, SHOULDER, TOTAL, REVERSE
Anesthesia: Regional | Site: Shoulder | Laterality: Right

## 2023-01-20 MED ORDER — ONDANSETRON HCL 4 MG/2ML IJ SOLN
INTRAMUSCULAR | Status: DC | PRN
  Administered 2023-01-20: 4 mg via INTRAVENOUS

## 2023-01-20 MED ORDER — MORPHINE SULFATE (PF) 4 MG/ML IV SOLN: 2.0000 mg | INTRAVENOUS | Status: DC | PRN

## 2023-01-20 MED ORDER — CEFAZOLIN SODIUM-DEXTROSE 2-4 GM/100ML-% IV SOLN
2.0000 g | INTRAVENOUS | Status: AC
  Administered 2023-01-20: 2 g via INTRAVENOUS

## 2023-01-20 MED ORDER — FENTANYL CITRATE (PF) 100 MCG/2ML IJ SOLN
INTRAMUSCULAR | Status: AC
  Filled 2023-01-20: qty 2

## 2023-01-20 MED ORDER — ACETAMINOPHEN 500 MG PO TABS
1000.0000 mg | ORAL_TABLET | Freq: Four times a day (QID) | ORAL | Status: AC
  Administered 2023-01-20 – 2023-01-21 (×4): 1000 mg via ORAL
  Filled 2023-01-20 (×4): qty 2

## 2023-01-20 MED ORDER — FENTANYL CITRATE (PF) 100 MCG/2ML IJ SOLN
25.0000 ug | INTRAMUSCULAR | Status: AC | PRN
  Administered 2023-01-20 (×6): 25 ug via INTRAVENOUS

## 2023-01-20 MED ORDER — VANCOMYCIN HCL 1000 MG IV SOLR
INTRAVENOUS | Status: DC | PRN
  Administered 2023-01-20: 1000 mg via TOPICAL

## 2023-01-20 MED ORDER — PHENYLEPHRINE HCL (PRESSORS) 10 MG/ML IV SOLN
INTRAVENOUS | Status: AC
  Filled 2023-01-20: qty 1

## 2023-01-20 MED ORDER — CEFAZOLIN SODIUM-DEXTROSE 2-4 GM/100ML-% IV SOLN
INTRAVENOUS | Status: AC
  Filled 2023-01-20: qty 100

## 2023-01-20 MED ORDER — ACETAMINOPHEN 500 MG PO TABS
ORAL_TABLET | ORAL | Status: AC
  Filled 2023-01-20: qty 2

## 2023-01-20 MED ORDER — GABAPENTIN 300 MG PO CAPS
ORAL_CAPSULE | ORAL | Status: AC
  Filled 2023-01-20: qty 1

## 2023-01-20 MED ORDER — AMISULPRIDE (ANTIEMETIC) 5 MG/2ML IV SOLN: 10.0000 mg | Freq: Once | INTRAVENOUS | Status: DC | PRN

## 2023-01-20 MED ORDER — POLYETHYLENE GLYCOL 3350 17 G PO PACK: 17.0000 g | PACK | Freq: Every day | ORAL | Status: DC | PRN

## 2023-01-20 MED ORDER — BUPIVACAINE LIPOSOME 1.3 % IJ SUSP
INTRAMUSCULAR | Status: DC | PRN
  Administered 2023-01-20: 10 mL via PERINEURAL

## 2023-01-20 MED ORDER — ROCURONIUM BROMIDE 10 MG/ML (PF) SYRINGE
PREFILLED_SYRINGE | INTRAVENOUS | Status: AC
  Filled 2023-01-20: qty 10

## 2023-01-20 MED ORDER — TRANEXAMIC ACID-NACL 1000-0.7 MG/100ML-% IV SOLN
1000.0000 mg | INTRAVENOUS | Status: AC
  Administered 2023-01-20: 1000 mg via INTRAVENOUS

## 2023-01-20 MED ORDER — DEXAMETHASONE SODIUM PHOSPHATE 10 MG/ML IJ SOLN
INTRAMUSCULAR | Status: AC
  Filled 2023-01-20: qty 1

## 2023-01-20 MED ORDER — ACETAMINOPHEN 500 MG PO TABS: 1000.0000 mg | ORAL_TABLET | Freq: Once | ORAL | Status: DC

## 2023-01-20 MED ORDER — ONDANSETRON HCL 4 MG PO TABS
4.0000 mg | ORAL_TABLET | Freq: Three times a day (TID) | ORAL | 0 refills | Status: AC | PRN
  Filled 2023-01-20: qty 10, 4d supply, fill #0

## 2023-01-20 MED ORDER — DEXAMETHASONE SODIUM PHOSPHATE 10 MG/ML IJ SOLN
INTRAMUSCULAR | Status: DC | PRN
  Administered 2023-01-20: 10 mg via INTRAVENOUS

## 2023-01-20 MED ORDER — SUGAMMADEX SODIUM 200 MG/2ML IV SOLN
INTRAVENOUS | Status: DC | PRN
  Administered 2023-01-20: 200 mg via INTRAVENOUS

## 2023-01-20 MED ORDER — OXYCODONE HCL 5 MG PO TABS: 5.0000 mg | ORAL_TABLET | Freq: Once | ORAL | Status: DC | PRN

## 2023-01-20 MED ORDER — PROMETHAZINE HCL 25 MG/ML IJ SOLN: 6.2500 mg | INTRAMUSCULAR | Status: DC | PRN

## 2023-01-20 MED ORDER — LIDOCAINE 2% (20 MG/ML) 5 ML SYRINGE
INTRAMUSCULAR | Status: DC | PRN
  Administered 2023-01-20: 40 mg via INTRAVENOUS

## 2023-01-20 MED ORDER — ATENOLOL 25 MG PO TABS
25.0000 mg | ORAL_TABLET | Freq: Every day | ORAL | Status: DC
  Administered 2023-01-21: 25 mg via ORAL
  Filled 2023-01-20 (×3): qty 1

## 2023-01-20 MED ORDER — ROCURONIUM BROMIDE 10 MG/ML (PF) SYRINGE
PREFILLED_SYRINGE | INTRAVENOUS | Status: DC | PRN
  Administered 2023-01-20: 50 mg via INTRAVENOUS

## 2023-01-20 MED ORDER — POVIDONE-IODINE 10 % EX SOLN
Freq: Once | CUTANEOUS | Status: DC
  Filled 2023-01-20: qty 473

## 2023-01-20 MED ORDER — MIDAZOLAM HCL 2 MG/2ML IJ SOLN
2.0000 mg | Freq: Once | INTRAMUSCULAR | Status: AC
  Administered 2023-01-20: 1 mg via INTRAVENOUS

## 2023-01-20 MED ORDER — MIDAZOLAM HCL 2 MG/2ML IJ SOLN
INTRAMUSCULAR | Status: AC
  Filled 2023-01-20: qty 2

## 2023-01-20 MED ORDER — PANTOPRAZOLE SODIUM 20 MG PO TBEC
20.0000 mg | DELAYED_RELEASE_TABLET | Freq: Every day | ORAL | Status: DC
  Administered 2023-01-21: 20 mg via ORAL
  Filled 2023-01-20 (×3): qty 1

## 2023-01-20 MED ORDER — METHOCARBAMOL 1000 MG/10ML IJ SOLN: 500.0000 mg | Freq: Four times a day (QID) | INTRAVENOUS | Status: DC | PRN

## 2023-01-20 MED ORDER — EPHEDRINE 5 MG/ML INJ
INTRAVENOUS | Status: AC
  Filled 2023-01-20: qty 5

## 2023-01-20 MED ORDER — SODIUM CHLORIDE FLUSH 0.9 % IV SOLN
INTRAVENOUS | Status: AC
  Filled 2023-01-20: qty 10

## 2023-01-20 MED ORDER — ATORVASTATIN CALCIUM 10 MG PO TABS
20.0000 mg | ORAL_TABLET | Freq: Every day | ORAL | Status: DC
  Administered 2023-01-21: 20 mg via ORAL
  Filled 2023-01-20: qty 1
  Filled 2023-01-20: qty 2
  Filled 2023-01-20: qty 1
  Filled 2023-01-20: qty 2

## 2023-01-20 MED ORDER — OXYCODONE HCL 5 MG/5ML PO SOLN: 5.0000 mg | Freq: Once | ORAL | Status: DC | PRN

## 2023-01-20 MED ORDER — PHENYLEPHRINE HCL-NACL 20-0.9 MG/250ML-% IV SOLN
INTRAVENOUS | Status: DC | PRN
  Administered 2023-01-20: 40 ug/min via INTRAVENOUS

## 2023-01-20 MED ORDER — OXYCODONE HCL 5 MG PO TABS
5.0000 mg | ORAL_TABLET | Freq: Four times a day (QID) | ORAL | 0 refills | Status: DC | PRN
  Filled 2023-01-20: qty 30, 5d supply, fill #0

## 2023-01-20 MED ORDER — ONDANSETRON HCL 4 MG/2ML IJ SOLN
INTRAMUSCULAR | Status: AC
  Filled 2023-01-20: qty 2

## 2023-01-20 MED ORDER — METOCLOPRAMIDE HCL 5 MG/ML IJ SOLN: 5.0000 mg | Freq: Three times a day (TID) | INTRAMUSCULAR | Status: DC | PRN

## 2023-01-20 MED ORDER — TRAMADOL HCL 50 MG PO TABS
50.0000 mg | ORAL_TABLET | Freq: Four times a day (QID) | ORAL | Status: DC
  Administered 2023-01-20 – 2023-01-21 (×4): 50 mg via ORAL
  Filled 2023-01-20 (×4): qty 1

## 2023-01-20 MED ORDER — 0.9 % SODIUM CHLORIDE (POUR BTL) OPTIME
TOPICAL | Status: DC | PRN
  Administered 2023-01-20: 1000 mL

## 2023-01-20 MED ORDER — METOCLOPRAMIDE HCL 5 MG PO TABS: 5.0000 mg | ORAL_TABLET | Freq: Three times a day (TID) | ORAL | Status: DC | PRN

## 2023-01-20 MED ORDER — EPHEDRINE SULFATE-NACL 50-0.9 MG/10ML-% IV SOSY
PREFILLED_SYRINGE | INTRAVENOUS | Status: DC | PRN
  Administered 2023-01-20: 10 mg via INTRAVENOUS
  Administered 2023-01-20: 5 mg via INTRAVENOUS
  Administered 2023-01-20 (×2): 10 mg via INTRAVENOUS

## 2023-01-20 MED ORDER — OXYCODONE HCL 5 MG PO TABS
5.0000 mg | ORAL_TABLET | ORAL | Status: DC | PRN
  Administered 2023-01-20 (×3): 5 mg via ORAL
  Filled 2023-01-20 (×2): qty 2
  Filled 2023-01-20: qty 1

## 2023-01-20 MED ORDER — BISACODYL 5 MG PO TBEC: 5.0000 mg | DELAYED_RELEASE_TABLET | Freq: Every day | ORAL | Status: DC | PRN

## 2023-01-20 MED ORDER — ACETAMINOPHEN 500 MG PO TABS
1000.0000 mg | ORAL_TABLET | Freq: Three times a day (TID) | ORAL | 0 refills | Status: AC
  Filled 2023-01-20: qty 84, 14d supply, fill #0

## 2023-01-20 MED ORDER — METHOCARBAMOL 500 MG PO TABS
500.0000 mg | ORAL_TABLET | Freq: Four times a day (QID) | ORAL | Status: DC | PRN
  Administered 2023-01-20 – 2023-01-21 (×2): 500 mg via ORAL
  Filled 2023-01-20 (×3): qty 1

## 2023-01-20 MED ORDER — PROPOFOL 10 MG/ML IV BOLUS
INTRAVENOUS | Status: DC | PRN
  Administered 2023-01-20: 160 mg via INTRAVENOUS

## 2023-01-20 MED ORDER — BUPIVACAINE HCL (PF) 0.5 % IJ SOLN
INTRAMUSCULAR | Status: DC | PRN
  Administered 2023-01-20: 20 mL via PERINEURAL

## 2023-01-20 MED ORDER — FENTANYL CITRATE (PF) 100 MCG/2ML IJ SOLN
INTRAMUSCULAR | Status: DC | PRN
  Administered 2023-01-20 (×4): 50 ug via INTRAVENOUS

## 2023-01-20 MED ORDER — LACTATED RINGERS IV SOLN: INTRAVENOUS | Status: DC

## 2023-01-20 MED ORDER — LIDOCAINE 2% (20 MG/ML) 5 ML SYRINGE
INTRAMUSCULAR | Status: AC
  Filled 2023-01-20: qty 5

## 2023-01-20 MED ORDER — TRANEXAMIC ACID-NACL 1000-0.7 MG/100ML-% IV SOLN
INTRAVENOUS | Status: AC
  Filled 2023-01-20: qty 100

## 2023-01-20 MED ORDER — OXYCODONE HCL 5 MG PO TABS: 10.0000 mg | ORAL_TABLET | ORAL | Status: DC | PRN

## 2023-01-20 MED ORDER — VANCOMYCIN HCL IN DEXTROSE 1-5 GM/200ML-% IV SOLN
1000.0000 mg | INTRAVENOUS | Status: AC
  Administered 2023-01-20: 1000 mg via INTRAVENOUS

## 2023-01-20 MED ORDER — FENTANYL CITRATE (PF) 100 MCG/2ML IJ SOLN
100.0000 ug | Freq: Once | INTRAMUSCULAR | Status: AC
  Administered 2023-01-20: 100 ug via INTRAVENOUS

## 2023-01-20 MED ORDER — GABAPENTIN 300 MG PO CAPS
300.0000 mg | ORAL_CAPSULE | Freq: Once | ORAL | Status: AC
  Administered 2023-01-20: 300 mg via ORAL

## 2023-01-20 MED ORDER — GABAPENTIN 100 MG PO CAPS
100.0000 mg | ORAL_CAPSULE | Freq: Three times a day (TID) | ORAL | 0 refills | Status: DC
  Filled 2023-01-20: qty 90, 30d supply, fill #0

## 2023-01-20 MED ORDER — VANCOMYCIN HCL 1000 MG IV SOLR
INTRAVENOUS | Status: AC
  Filled 2023-01-20: qty 20

## 2023-01-20 MED ORDER — LOSARTAN POTASSIUM 50 MG PO TABS
50.0000 mg | ORAL_TABLET | Freq: Every day | ORAL | Status: DC
  Administered 2023-01-21: 50 mg via ORAL
  Filled 2023-01-20 (×3): qty 1

## 2023-01-20 MED ORDER — HYDROCHLOROTHIAZIDE 12.5 MG PO TABS
12.5000 mg | ORAL_TABLET | Freq: Every day | ORAL | Status: DC
  Administered 2023-01-21: 12.5 mg via ORAL
  Filled 2023-01-20 (×3): qty 1

## 2023-01-20 MED ORDER — ONDANSETRON HCL 4 MG PO TABS: 4.0000 mg | ORAL_TABLET | Freq: Four times a day (QID) | ORAL | Status: DC | PRN

## 2023-01-20 MED ORDER — CEFAZOLIN SODIUM-DEXTROSE 2-4 GM/100ML-% IV SOLN
2.0000 g | Freq: Four times a day (QID) | INTRAVENOUS | Status: AC
  Administered 2023-01-20 – 2023-01-21 (×3): 2 g via INTRAVENOUS
  Filled 2023-01-20 (×3): qty 100

## 2023-01-20 MED ORDER — ONDANSETRON HCL 4 MG/2ML IJ SOLN: 4.0000 mg | Freq: Four times a day (QID) | INTRAMUSCULAR | Status: DC | PRN

## 2023-01-20 MED ORDER — DOCUSATE SODIUM 100 MG PO CAPS
100.0000 mg | ORAL_CAPSULE | Freq: Two times a day (BID) | ORAL | Status: DC
  Filled 2023-01-20 (×3): qty 1

## 2023-01-20 MED ORDER — VANCOMYCIN HCL IN DEXTROSE 1-5 GM/200ML-% IV SOLN
INTRAVENOUS | Status: AC
  Filled 2023-01-20: qty 200

## 2023-01-20 MED ORDER — BUPROPION HCL ER (SR) 100 MG PO TB12
100.0000 mg | ORAL_TABLET | Freq: Two times a day (BID) | ORAL | Status: DC
  Administered 2023-01-20 – 2023-01-21 (×2): 100 mg via ORAL
  Filled 2023-01-20 (×4): qty 1

## 2023-01-20 SURGICAL SUPPLY — 69 items
AID PSTN UNV HD RSTRNT DISP (MISCELLANEOUS) ×1
APL PRP STRL LF DISP 70% ISPRP (MISCELLANEOUS) ×2
BASEPLATE SHOULDER FW 15D 29 (Joint) IMPLANT
BIT DRILL 3.2 PERIPHERAL SCREW (BIT) IMPLANT
BLADE SAW SGTL 73X25 THK (BLADE) ×1 IMPLANT
BLADE SURG 10 STRL SS (BLADE) IMPLANT
BLADE SURG 15 STRL LF DISP TIS (BLADE) IMPLANT
BLADE SURG 15 STRL SS (BLADE)
BRUSH SCRUB EZ PLAIN DRY (MISCELLANEOUS) ×1 IMPLANT
BSPLAT GLND 15D 29 FULL WDG (Joint) ×1 IMPLANT
CHLORAPREP W/TINT 26 (MISCELLANEOUS) ×1 IMPLANT
CLSR STERI-STRIP ANTIMIC 1/2X4 (GAUZE/BANDAGES/DRESSINGS) ×1 IMPLANT
COOLER ICEMAN CLASSIC (MISCELLANEOUS) ×1 IMPLANT
COVER BACK TABLE 60X90IN (DRAPES) ×1 IMPLANT
COVER MAYO STAND STRL (DRAPES) ×1 IMPLANT
CUP HUM INSERT SHLD 3/4 39 +0 (Cup) IMPLANT
DRAPE IMP U-DRAPE 54X76 (DRAPES) IMPLANT
DRAPE INCISE IOBAN 66X45 STRL (DRAPES) ×1 IMPLANT
DRAPE POUCH INSTRU U-SHP 10X18 (DRAPES) ×1 IMPLANT
DRAPE U-SHAPE 76X120 STRL (DRAPES) ×2 IMPLANT
DRSG AQUACEL AG ADV 3.5X 6 (GAUZE/BANDAGES/DRESSINGS) ×1 IMPLANT
ELECT BLADE 4.0 EZ CLEAN MEGAD (MISCELLANEOUS) ×1
ELECT REM PT RETURN 9FT ADLT (ELECTROSURGICAL) ×1
ELECTRODE BLDE 4.0 EZ CLN MEGD (MISCELLANEOUS) ×1 IMPLANT
ELECTRODE REM PT RTRN 9FT ADLT (ELECTROSURGICAL) ×1 IMPLANT
FACESHIELD WRAPAROUND (MASK) ×2 IMPLANT
FACESHIELD WRAPAROUND OR TEAM (MASK) ×2 IMPLANT
GAUZE XEROFORM 1X8 LF (GAUZE/BANDAGES/DRESSINGS) IMPLANT
GLENOSPHERE STANDARD 39 (Joint) ×1 IMPLANT
GLENOSPHERE STD 39 (Joint) IMPLANT
GLOVE BIO SURGEON STRL SZ 6.5 (GLOVE) ×2 IMPLANT
GLOVE BIOGEL PI IND STRL 6.5 (GLOVE) ×1 IMPLANT
GLOVE BIOGEL PI IND STRL 8 (GLOVE) ×1 IMPLANT
GLOVE ECLIPSE 8.0 STRL XLNG CF (GLOVE) ×2 IMPLANT
GOWN STRL REUS W/ TWL LRG LVL3 (GOWN DISPOSABLE) ×2 IMPLANT
GOWN STRL REUS W/TWL LRG LVL3 (GOWN DISPOSABLE) ×2
GOWN STRL REUS W/TWL XL LVL3 (GOWN DISPOSABLE) ×1 IMPLANT
GUIDE PIN 3X75 SHOULDER (PIN) ×1
GUIDEWIRE GLENOID 2.5X220 (WIRE) IMPLANT
HANDPIECE INTERPULSE COAX TIP (DISPOSABLE) ×1
KIT SHOULDER STAB MARCO (KITS) IMPLANT
KIT STABILIZATION SHOULDER (MISCELLANEOUS) ×1 IMPLANT
MANIFOLD NEPTUNE II (INSTRUMENTS) ×1 IMPLANT
PACK BASIN DAY SURGERY FS (CUSTOM PROCEDURE TRAY) ×1 IMPLANT
PACK SHOULDER (CUSTOM PROCEDURE TRAY) ×1 IMPLANT
PAD COLD SHLDR WRAP-ON (PAD) ×1 IMPLANT
PIN GUIDE 3X75 SHOULDER (PIN) IMPLANT
RESTRAINT HEAD UNIVERSAL NS (MISCELLANEOUS) ×1 IMPLANT
SCREW 5.5X22 (Screw) IMPLANT
SCREW BONE 6.5X40 SM (Screw) IMPLANT
SCREW PERIPHERAL 5.0X34 (Screw) IMPLANT
SET HNDPC FAN SPRY TIP SCT (DISPOSABLE) ×1 IMPLANT
SHEET MEDIUM DRAPE 40X70 STRL (DRAPES) ×1 IMPLANT
SLEEVE SCD COMPRESS KNEE MED (STOCKING) ×1 IMPLANT
SPIKE FLUID TRANSFER (MISCELLANEOUS) IMPLANT
SPONGE T-LAP 18X18 ~~LOC~~+RFID (SPONGE) ×1 IMPLANT
STEM HUM PERFORM 4 (Stem) IMPLANT
SUT ETHIBOND 2 V 37 (SUTURE) ×1 IMPLANT
SUT ETHIBOND NAB CT1 #1 30IN (SUTURE) ×1 IMPLANT
SUT ETHILON 3 0 PS 1 (SUTURE) IMPLANT
SUT FIBERWIRE #5 38 CONV NDL (SUTURE) ×4
SUT MNCRL AB 4-0 PS2 18 (SUTURE) ×1 IMPLANT
SUT VIC AB 0 CT1 27 (SUTURE)
SUT VIC AB 0 CT1 27XBRD ANBCTR (SUTURE) IMPLANT
SUT VIC AB 3-0 SH 27 (SUTURE) ×1
SUT VIC AB 3-0 SH 27X BRD (SUTURE) ×1 IMPLANT
SUTURE FIBERWR #5 38 CONV NDL (SUTURE) ×4 IMPLANT
TOWEL GREEN STERILE FF (TOWEL DISPOSABLE) ×3 IMPLANT
TUBE SUCTION HIGH CAP CLEAR NV (SUCTIONS) ×1 IMPLANT

## 2023-01-20 NOTE — Anesthesia Procedure Notes (Signed)
Anesthesia Regional Block: Interscalene brachial plexus block   Pre-Anesthetic Checklist: , timeout performed,  Correct Patient, Correct Site, Correct Laterality,  Correct Procedure, Correct Position, site marked,  Risks and benefits discussed,  Surgical consent,  Pre-op evaluation,  At surgeon's request and post-op pain management  Laterality: Left  Prep: chloraprep       Needles:  Injection technique: Single-shot  Needle Type: Stimiplex     Needle Length: 9cm  Needle Gauge: 21     Additional Needles:   Procedures:,,,, ultrasound used (permanent image in chart),,    Narrative:  Start time: 01/20/2023 8:00 AM End time: 01/20/2023 8:05 AM Injection made incrementally with aspirations every 5 mL.  Performed by: Personally  Anesthesiologist: Lowella Curb, MD

## 2023-01-20 NOTE — Progress Notes (Signed)
Assisted Dr. Miller with right, interscalene  block. Side rails up, monitors on throughout procedure. See vital signs in flow sheet. Tolerated Procedure well.  

## 2023-01-20 NOTE — Interval H&P Note (Signed)
All questions answered, patient wants to proceed with procedure. ? ?

## 2023-01-20 NOTE — Anesthesia Preprocedure Evaluation (Signed)
Anesthesia Evaluation  Patient identified by MRN, date of birth, ID band Patient awake    Reviewed: Allergy & Precautions, NPO status , Patient's Chart, lab work & pertinent test results  History of Anesthesia Complications Negative for: history of anesthetic complications  Airway Mallampati: II  TM Distance: >3 FB Neck ROM: Full    Dental no notable dental hx. (+) Dental Advisory Given   Pulmonary neg pulmonary ROS, sleep apnea and Continuous Positive Airway Pressure Ventilation , former smoker   Pulmonary exam normal        Cardiovascular hypertension, Pt. on medications + CAD  Normal cardiovascular exam  Echo 03/2018: Normal EF, Mild AI, O/W nl   Neuro/Psych  PSYCHIATRIC DISORDERS  Depression    negative neurological ROS  negative psych ROS   GI/Hepatic negative GI ROS, Neg liver ROS,,,  Endo/Other  negative endocrine ROS    Renal/GU Renal InsufficiencyRenal disease  negative genitourinary   Musculoskeletal negative musculoskeletal ROS (+) Arthritis , Osteoarthritis,    Abdominal   Peds negative pediatric ROS (+)  Hematology negative hematology ROS (+)   Anesthesia Other Findings   Reproductive/Obstetrics negative OB ROS                             Anesthesia Physical Anesthesia Plan  ASA: III  Anesthesia Plan: General and Regional   Post-op Pain Management: Regional block* and Minimal or no pain anticipated   Induction: Intravenous  PONV Risk Score and Plan: 2 and Ondansetron, Midazolam and Treatment may vary due to age or medical condition  Airway Management Planned: Oral ETT  Additional Equipment:   Intra-op Plan:   Post-operative Plan: Extubation in OR  Informed Consent: I have reviewed the patients History and Physical, chart, labs and discussed the procedure including the risks, benefits and alternatives for the proposed anesthesia with the patient or authorized  representative who has indicated his/her understanding and acceptance.     Dental advisory given  Plan Discussed with: CRNA and Anesthesiologist  Anesthesia Plan Comments:         Anesthesia Quick Evaluation

## 2023-01-20 NOTE — Transfer of Care (Signed)
Immediate Anesthesia Transfer of Care Note  Patient: Joseph Maldonado.  Procedure(s) Performed: REVERSE SHOULDER ARTHROPLASTY (Right: Shoulder)  Patient Location: PACU  Anesthesia Type:GA combined with regional for post-op pain  Level of Consciousness: awake, alert , and oriented  Airway & Oxygen Therapy: Patient Spontanous Breathing and Patient connected to face mask oxygen  Post-op Assessment: Report given to RN and Post -op Vital signs reviewed and stable  Post vital signs: Reviewed and stable  Last Vitals:  Vitals Value Taken Time  BP 145/92   Temp    Pulse 57 01/20/23 0956  Resp 18 01/20/23 0956  SpO2 100 % 01/20/23 0956  Vitals shown include unvalidated device data.  Last Pain:  Vitals:   01/20/23 0735  TempSrc: Oral  PainSc: 0-No pain      Patients Stated Pain Goal: 4 (01/20/23 0735)  Complications: No notable events documented.

## 2023-01-20 NOTE — Op Note (Signed)
Orthopaedic Surgery Operative Note (CSN: [id number])  Joseph Maldonado.  Aug 04, 1946 Date of Surgery: 01/20/2023   Diagnoses:  Right end-stage cuff tear arthropathy  Procedure: Right reverse lateralized augmented total Shoulder Arthroplasty   Operative Finding Successful completion of planned procedure.  Patient's glenoid was extraordinarily dished out from his chronic cuff tear arthropathy, there was asked apposition of the acromion.  The case was rather complicated and our implant position ended up being based on our three-dimensional planning.  We had to recut the humeral side to avoid a valgus cut and excepted that we had good calcar fit with her implant though there was a small amount of uncovering superior laterally.  The implant had great fit.  Baseplate seating was near full.  Once patient was asleep in the operating room the anesthesia team identified that they had blocked the contralateral left shoulder.  Our mark was on the right side and we confirmed again that the right side was appropriate.  We discussed this with the family and proceed with the case.  Post-operative plan: The patient will be NWB in sling.  The patient will be will be admitted to observation due to medical complexity, monitoring and pain management.  DVT prophylaxis not indicated in this ambulatory upper extremity patient without significant risk factors.  Patient has an intolerance to NSAIDs and thus we elected to avoid anticoagulants outpatient.  Pain control with PRN pain medication preferring oral medicines.  Follow up plan will be scheduled in approximately 7 days for incision check and XR.  Physical therapy to start immediately.  Patient's family request x-rays of contralateral shoulder at their first postop visit.  Implants: Tornier perform humeral size 4 stem, 0 polyethylene retentive, 39 standard glenosphere with a 29 full wedge baseplate and 40 center screw with 3 peripheral locking and 1 nonlocking  screw.  Post-Op Diagnosis: Same Surgeons:Primary: Bjorn Pippin, MD Assistants:Caroline McBane PA-C Location: [facility] OR ROOM 1 Anesthesia: General with Exparel Interscalene Antibiotics: Ancef 2g preop, Vancomycin  locally Tourniquet time: None Estimated Blood Loss: 100 Complications: Anesthesia block of contralateral side. Specimens: None Implants: Implant Name Type Inv. Item Serial No. Manufacturer Lot No. LRB No. Used Action  BASEPLATE SHOULDER FW 15D 29 - [id number] Joint BASEPLATE SHOULDER FW 15D 29 [id number] [employer] [id number] Right 1 Implanted  GLENOSPHERE STANDARD 39 - [id number] Joint GLENOSPHERE STANDARD 39 [id number] [employer]ORNIER [employer] [id number] Right 1 Implanted  SCREW BONE 6.5X40 SM - [id number] Screw SCREW BONE 6.5X40 SM  [employer] ON SET Right 1 Implanted  SCREW PERIPHERAL 5.0X34 - [id number] Screw SCREW PERIPHERAL 5.0X34  [employer]  Right 3 Implanted  SCREW 5.5X22 - [id number] Screw SCREW 5.5X22  [employer] ON SET Right 1 Implanted  CUP HUM INSERT SHLD 3/4 39 +0 - [id number] Cup CUP HUM INSERT SHLD 3/4 39 +0  [employer] [id number] Right 1 Implanted  STEM HUM PERFORM 4 - [id number] Stem STEM HUM PERFORM 4  [employer] INC [id number] Right 1 Implanted    Indications for Surgery:   Joseph Maldonado. is a 77 y.o. male with right cuff tear arthropathy.  Benefits and risks of operative and nonoperative management were discussed prior to surgery with patient/guardian(s) and informed consent form was completed.  Infection and need for further surgery were discussed as was prosthetic stability and cuff issues.  We additionally specifically discussed risks of axillary nerve injury, infection, periprosthetic fracture, continued pain and longevity of implants prior to beginning procedure.      Procedure:   The patient  was identified in the preoperative holding area where the surgical site was marked. Block placed by anesthesia with exparel but it was noted after the block was  completed and the operating suite that it was done on the contralateral left side.  The patient was taken to the OR where a procedural timeout was called and the above noted anesthesia was induced.  The patient was positioned beachchair on allen table with spider arm positioner.  Preoperative antibiotics were dosed.  The patient's right shoulder was prepped and draped in the usual sterile fashion.  A second preoperative timeout was called.       Standard deltopectoral approach was performed with a #10 blade. We dissected down to the subcutaneous tissues and the cephalic vein was taken laterally with the deltoid. Clavipectoral fascia was incised in line with the incision. Deep retractors were placed. The long of the biceps tendon was identified and there was significant tenosynovitis present.  Tenodesis was performed to the pectoralis tendon with #2 Ethibond. The remaining biceps was followed up into the rotator interval where it was released.   The subscapularis was taken down in a full thickness layer with capsule along the humeral neck extending inferiorly around the humeral head. We continued releasing the capsule directly off of the osteophytes inferiorly all the way around the corner. This allowed [name] to dislocate the humeral head.   The humeral head had evidence of severe osteoarthritic wear with full-thickness cartilage loss and exposed subchondral bone. There was significant flattening of the humeral head.  The rotator cuff was carefully examined and noted to be irreperably torn.  The decision was confirmed that a reverse total shoulder was indicated for this patient.  There were osteophytes along the inferior humeral neck. The osteophytes were removed with an osteotome and a rongeur.  Osteophytes were removed with a rongeur and an osteotome and the anatomic neck was well visualized.     A humeral cutting guide was used extra medullary with a pin to help control version. The version was set at  20 of retroversion. Humeral osteotomy was performed with an oscillating saw. The head fragment was passed off the back table.  A cut protector plate was placed.  The subscapularis was again identified and immediately we took care to palpate the axillary nerve anteriorly and verify its position with gentle palpation as well as the tug test.  We then released the SGHL with bovie cautery prior to placing a curved mayo at the junction of the anterior glenoid well above the axillary nerve and bluntly dissecting the subscapularis from the capsule.  We then carefully protected the axillary nerve as we gently released the inferior capsule to fully mobilize the subscapularis.  An anterior deltoid retractor was then placed as well as a small Hohmann retractor superiorly.   The glenoid was inspected and had evidence of severe osteoarthritic wear with full-thickness cartilage loss and exposed subchondral bone.   The remaining labrum was removed circumferentially taking great care not to disrupt the posterior capsule.   At this point we felt based on blueprint templating that a full wedge augment was necessary.  We began by using a full wedge guide to place our center pin as was templated.  We had good position of this pin and we proceeded with our starter center drill.  This allowed for [name] to use the 15 degree full wedge reamer obtaining circumferential witness marks and good bone preparation for ingrowth.  At this point we proceeded with our center  drill and had an intact vault.  We then drilled our center screw to a length of 39 mm.    We selected a 6.5 mm x 40 mm screw and the full wedge baseplate which was placed in the same orientation as our reaming.  We double checked that we had good apposition of the base plate to bone and then proceeded to place 3 locking screws and one nonlocking screw as is typical.   We turned attention back to the humeral side. The cut protector was removed.  We used the perform  humeral sizing block to select the appropriate size which for this patient was a 4.  We noted that with retractor tension and to get exposure our cut was slightly more valgus than we would like.  We replaced our Guide and cut the inferior aspect of the osteotomy into slightly more varus consistent 135 degree cut angle.  We then placed our center pin and reamed over it concentrically obtaining appropriate inset.  We then used our lateralizing chisel to prepare the lateral aspect of the humerus.  At that point we selected the appropriate implant trialing a 4.  The calcar portion of the stem obtained good fit and though there was a 2 to 3 mm uncovered area of the proximal lateral aspect of the stem we felt that was appropriate.  Using this trial implant we trialed multiple polyethylene sizes settling on a 0 retentive which provided good stability and range of motion without excess soft tissue tension. The offset was dialed in to match the normal anatomy. The shoulder was trialed.  There was good ROM in all planes and the shoulder was stable with no inferior translation.  The real humeral implants were opened after again confirming sizes.  The trial was removed. #5 Fiberwire x2 sutures passed through the humeral neck for subscap repair. The humeral component was press-fit obtaining a secure fit. The joint was reduced and thoroughly irrigated with pulsatile lavage. Subscap was repaired back with #5 Fiberwire sutures through bone tunnels. Hemostasis was obtained. The deltopectoral interval was reapproximated with #1 Ethibond. The subcutaneous tissues were closed with 2-0 Vicryl and the skin was closed with running monocryl.    The wounds were cleaned and dried and an Aquacel dressing was placed. The drapes taken down. The arm was placed into sling with abduction pillow. Patient was awakened, extubated, and transferred to the recovery room in stable condition. There were no intraoperative complications. The sponge,  needle, and attention counts were correct at the end of the case.     Alfonse Alpers, PA-C, present and scrubbed throughout the case, critical for completion in a timely fashion, and for retraction, instrumentation, closure.

## 2023-01-20 NOTE — Anesthesia Procedure Notes (Signed)
Procedure Name: Intubation Date/Time: 01/20/2023 8:27 AM  Performed by: Pearson Grippe, CRNAPre-anesthesia Checklist: Patient identified, Emergency Drugs available, Suction available and Patient being monitored Patient Re-evaluated:Patient Re-evaluated prior to induction Oxygen Delivery Method: Circle system utilized Preoxygenation: Pre-oxygenation with 100% oxygen Induction Type: IV induction Ventilation: Mask ventilation without difficulty Laryngoscope Size: Miller and 2 Grade View: Grade I Tube type: Oral Tube size: 7.5 mm Number of attempts: 1 Airway Equipment and Method: Stylet Placement Confirmation: ETT inserted through vocal cords under direct vision, positive ETCO2 and breath sounds checked- equal and bilateral Secured at: 24 cm Tube secured with: Tape Dental Injury: Teeth and Oropharynx as per pre-operative assessment

## 2023-01-20 NOTE — Anesthesia Postprocedure Evaluation (Signed)
Anesthesia Post Note  Patient: Joseph Maldonado.  Procedure(s) Performed: REVERSE SHOULDER ARTHROPLASTY (Right: Shoulder)     Patient location during evaluation: PACU Anesthesia Type: General Level of consciousness: awake and alert Pain management: pain level controlled Vital Signs Assessment: post-procedure vital signs reviewed and stable Respiratory status: spontaneous breathing, nonlabored ventilation and respiratory function stable Cardiovascular status: blood pressure returned to baseline and stable Postop Assessment: no apparent nausea or vomiting Comments: Wrong shoulder blocked pre-operatively   No notable events documented.  Last Vitals:  Vitals:   01/20/23 1100 01/20/23 1115  BP: (!) 142/74 134/71  Pulse: (!) 57 62  Resp: 14 13  Temp:    SpO2: 99%     Last Pain:  Vitals:   01/20/23 1106  TempSrc:   PainSc: 6                  Lowella Curb

## 2023-01-21 ENCOUNTER — Other Ambulatory Visit (HOSPITAL_COMMUNITY): Payer: Self-pay

## 2023-01-21 DIAGNOSIS — I2510 Atherosclerotic heart disease of native coronary artery without angina pectoris: Secondary | ICD-10-CM | POA: Diagnosis not present

## 2023-01-21 DIAGNOSIS — I129 Hypertensive chronic kidney disease with stage 1 through stage 4 chronic kidney disease, or unspecified chronic kidney disease: Secondary | ICD-10-CM | POA: Diagnosis not present

## 2023-01-21 DIAGNOSIS — M19011 Primary osteoarthritis, right shoulder: Secondary | ICD-10-CM | POA: Diagnosis not present

## 2023-01-21 DIAGNOSIS — Z87891 Personal history of nicotine dependence: Secondary | ICD-10-CM | POA: Diagnosis not present

## 2023-01-21 DIAGNOSIS — M25711 Osteophyte, right shoulder: Secondary | ICD-10-CM | POA: Diagnosis not present

## 2023-01-21 DIAGNOSIS — N189 Chronic kidney disease, unspecified: Secondary | ICD-10-CM | POA: Diagnosis not present

## 2023-01-21 MED ORDER — ASPIRIN 81 MG PO CHEW
81.0000 mg | CHEWABLE_TABLET | Freq: Every day | ORAL | 0 refills | Status: AC
  Filled 2023-01-21: qty 30, 30d supply, fill #0

## 2023-01-21 MED ORDER — TIZANIDINE HCL 2 MG PO CAPS
2.0000 mg | ORAL_CAPSULE | Freq: Three times a day (TID) | ORAL | 0 refills | Status: DC | PRN
  Filled 2023-01-21: qty 30, 10d supply, fill #0

## 2023-01-24 ENCOUNTER — Encounter (HOSPITAL_BASED_OUTPATIENT_CLINIC_OR_DEPARTMENT_OTHER): Payer: Self-pay | Admitting: Orthopaedic Surgery

## 2023-01-24 DIAGNOSIS — M19011 Primary osteoarthritis, right shoulder: Secondary | ICD-10-CM | POA: Diagnosis not present

## 2023-01-24 DIAGNOSIS — M25611 Stiffness of right shoulder, not elsewhere classified: Secondary | ICD-10-CM | POA: Diagnosis not present

## 2023-01-24 DIAGNOSIS — S46011D Strain of muscle(s) and tendon(s) of the rotator cuff of right shoulder, subsequent encounter: Secondary | ICD-10-CM | POA: Diagnosis not present

## 2023-01-24 DIAGNOSIS — M6281 Muscle weakness (generalized): Secondary | ICD-10-CM | POA: Diagnosis not present

## 2023-01-26 DIAGNOSIS — M19011 Primary osteoarthritis, right shoulder: Secondary | ICD-10-CM | POA: Diagnosis not present

## 2023-01-26 DIAGNOSIS — M6281 Muscle weakness (generalized): Secondary | ICD-10-CM | POA: Diagnosis not present

## 2023-01-26 DIAGNOSIS — S46011D Strain of muscle(s) and tendon(s) of the rotator cuff of right shoulder, subsequent encounter: Secondary | ICD-10-CM | POA: Diagnosis not present

## 2023-01-26 DIAGNOSIS — M25611 Stiffness of right shoulder, not elsewhere classified: Secondary | ICD-10-CM | POA: Diagnosis not present

## 2023-01-27 DIAGNOSIS — M25512 Pain in left shoulder: Secondary | ICD-10-CM | POA: Diagnosis not present

## 2023-01-27 DIAGNOSIS — M19011 Primary osteoarthritis, right shoulder: Secondary | ICD-10-CM | POA: Diagnosis not present

## 2023-01-28 ENCOUNTER — Other Ambulatory Visit (HOSPITAL_COMMUNITY): Payer: Self-pay

## 2023-01-31 ENCOUNTER — Other Ambulatory Visit (HOSPITAL_COMMUNITY): Payer: Self-pay

## 2023-01-31 MED ORDER — BUPROPION HCL ER (SR) 100 MG PO TB12
ORAL_TABLET | ORAL | 0 refills | Status: DC
  Filled 2023-01-31: qty 60, 30d supply, fill #0
  Filled 2023-02-04: qty 60, 31d supply, fill #0

## 2023-02-01 DIAGNOSIS — S46011D Strain of muscle(s) and tendon(s) of the rotator cuff of right shoulder, subsequent encounter: Secondary | ICD-10-CM | POA: Diagnosis not present

## 2023-02-01 DIAGNOSIS — M19011 Primary osteoarthritis, right shoulder: Secondary | ICD-10-CM | POA: Diagnosis not present

## 2023-02-01 DIAGNOSIS — M25611 Stiffness of right shoulder, not elsewhere classified: Secondary | ICD-10-CM | POA: Diagnosis not present

## 2023-02-01 DIAGNOSIS — M6281 Muscle weakness (generalized): Secondary | ICD-10-CM | POA: Diagnosis not present

## 2023-02-04 ENCOUNTER — Other Ambulatory Visit (HOSPITAL_COMMUNITY): Payer: Self-pay

## 2023-02-07 ENCOUNTER — Encounter: Payer: Self-pay | Admitting: Neurology

## 2023-02-07 ENCOUNTER — Ambulatory Visit (INDEPENDENT_AMBULATORY_CARE_PROVIDER_SITE_OTHER): Payer: Medicare Other | Admitting: Neurology

## 2023-02-07 VITALS — BP 113/61 | HR 57 | Ht 70.0 in | Wt 213.0 lb

## 2023-02-07 DIAGNOSIS — R4189 Other symptoms and signs involving cognitive functions and awareness: Secondary | ICD-10-CM | POA: Diagnosis not present

## 2023-02-07 DIAGNOSIS — E519 Thiamine deficiency, unspecified: Secondary | ICD-10-CM | POA: Diagnosis not present

## 2023-02-07 NOTE — Patient Instructions (Addendum)
Fornal neurocognitive testing - Dr. Clayborn Heron   Recommendations to prevent or slow progression of cognitive decline:   Exercise You should increase exercise 30 to 45 minutes per day at least 3 days a week although 5 to 7 would be preferred. Any type of exercise (including walking) is acceptable although a recumbent bicycle may be best if you are unsteady. Disease related apathy can be a significant roadblock to exercise and the only way to overcome this is to make it a daily routine and perhaps have a reward at the end (something your loved one loves to eat or drink perhaps) or a personal trainer coming to the home can also be very useful. In general a structured, repetitive schedule is best.   Cardiovascular Health: You should optimize all cardiovascular risk factors (blood pressure, sugar, cholesterol) as vascular disease such as strokes and heart attacks can make memory problems much worse.   Diet: Eating a heart healthy (Mediterranean) diet is also a good idea; fish and poultry instead of red meat, nuts (mostly non-peanuts), vegetables, fruits, olive oil or canola oil (instead of butter), minimal salt (use other spices to flavor foods), whole grain rice, bread, cereal and pasta and wine in moderation.  General Health: Any diseases which effect your body will effect your brain such as a pneumonia, urinary infection, blood clot, heart attack or stroke. Keep contact with your primary care doctor for regular follow ups.  Sleep. A good nights sleep is healthy for the brain. Seven hours is recommended. If you have insomnia or poor sleep habits see the recommendations below  Tips: Structured and consistent daytime and nighttime routine, including regular wake times, bedtimes, and mealtimes, will be important for the patient to avoid confusion. Keeping frequently used items in designated places will help reduce stress from searching. If there are worries about getting lost do not let the patient leave  home unaccompanied. They might benefit from wearing an identification bracelet that will help others assist in finding home if they become lost. Information about nationwide safe return services and other helpful resources may be obtained through the Alzheimer's Association helpline at [phone].  Finances, Power of Restaurant manager, fast food Directives: You should consider putting legal safeguards in place with regard to financial and medical decision making. While the spouse always has power of attorney for medical and financial issues in the absence of any form, you should consider what you want in case the spouse / caregiver is no longer around or capable of making decisions.   [place] : MovieEvening.com.[email or web address].pdf  Or Google "[facility]" AND "An Proofreader for [employer]  Other States: PainBasics.com.au  The signature on these forms should be notarized.   DRIVING:   Driving only during the day Drive only to familiar Locations Avoid driving during bad weather  If you would like to be tested to see if you are driving safely, [facility] has a Clinical Driving Evaluation. To schedule an appointment call [phone].                RESOURCES:  Memory Loss: Improve your short term memory By Laverle Patter  The Alzheimer's Reading Room [email or web address]   The Alzheimer's Compendium http://www.alzcompend.info/  [employer] www.dukefamilysupport.UJW [phone]  Recommended resources for caregivers (All can be purchased on [employer]):  1) A Caregiver's Guide to Dementia: Using Activities and Other Strategies to Prevent, Reduce and Manage Behavioral Symptoms by Mahala Menghini. [place] and [employer]   2) A Caregiver's Guide to [employer]  Dementia by Briant Sites MS BSN and Velia Meyer    3) What If It's Not Alzheimer's?: A Caregiver's Guide to Dementia by Neila Gear (Author), Roberts Gaudy (Editor)  3) The 36 hour day by Rabins and Mace  4) Understanding Difficult Behaviors by Gerre Scull and White  Online course for helping caregivers reduce stress, guilt and frustration called the Caregivers Helpbook. The website is www.powerfultoolsforcaregivers.org  As a caregiver you are a Government social research officer. Problems you face as a caregiver are usually unique to your situation and the way your loved-one's disease manifests itself. The best way to use these books is to look at the Table of Contents and read any chapters of interest or that apply to challenges you are having as a caregiver.  NATIONAL RESOURCES: For more information on neurological disorders or research programs funded by the [employer] of Neurological Disorders and Stroke, contact the Institute's Gaffer (BRAIN) at: BRAIN [address] [place] [address] [phone] (toll-free) ToledoAutomobile.co.uk  Information on dementia is also available from the following organizations: Alzheimer's Disease Education and Referral (ADEAR) Center [employer] on Aging [address] [place] [address] [phone] (toll-free) CashCowGambling.be  Alzheimer's Association [address] [place], [place] [address] [phone] (toll-free, 24-hour helpline) [phone] (TDD) LimitLaws.hu  Alzheimer's Foundation of [place] [address] [place], [place] [address] [phone] (toll-free) www.alzfdn.org  Alzheimer's Drug Discovery Foundation [address] [place], [place] [address] [phone] www.alzdiscovery.org  Association for Frontotemporal Degeneration [employer] [address] [place], [place] [address] [phone] (toll-free) www.theaftd.[facility]  [facility] [facility] [address] [place], [place] [address] [phone] (toll-free) www.brightfocus.[email or web address]/alzheimers  [facility] Gala [facility] Alzheimer's Foundation [address] [place], [place] [address] [phone] www.BushWebsites.nl  Lewy Body Dementia Association [address] [place], [place] [address] [phone] (toll-free LBD Caregiver Link) www.lbda.[facility] [address] [place], [place] [address] [phone] (toll-free) [phone] [facility]) http://www.maynard.net/  National Organization for Rare Disorders [address] [place], [place] [address] [phone] [phone]) (toll-free) www.rarediseases.org  The Dementias: Hope Through Research was jointly produced by the [employer] of Neurological Disorders and Stroke (NINDS) and the [employer] on Aging (NIA), both part of the [employer], the [employer] research agency--supporting scientific studies that turn discovery into health. NINDS is the nation's leading funder of research on the brain and nervous system. The NINDS mission is to reduce the burden of neurological disease. For more information and resources, visit ToledoAutomobile.co.uk [1] or call ([phone]. NIA leads the federal government effort conducting and supporting research on aging and the health and well-being of older people. NIA's Alzheimer's Disease Education and Referral (ADEAR) Center offers information and publications on dementia and caregiving for families, caregivers, and professionals. For more information, visit CashCowGambling.be [2] or call [phone]. Also available from NIA are publications and information about Alzheimer's disease as well as the booklets Frontotemporal Disorders: Information for Patients, Families, and Caregivers and Lewy Body Dementia: Information for Patients, Families, and Professionals. Source URL:  VoiceZap.is

## 2023-02-07 NOTE — Progress Notes (Unsigned)
[facility]    Provider:  Dr Lucia Gaskins Requesting Provider: Charlane Ferretti, DO Primary Care Provider:  Charlane Ferretti, DO  CC:  memory loss  HPI:  Joseph Maldonado. is a 77 y.o. male here as requested by Charlane Ferretti, DO for memory loss. has Cough; Solitary pulmonary nodule; CKD (chronic kidney disease); Seborrheic keratosis; Knee joint replacement by other means; Hip joint replacement by other means; S/P total knee arthroplasty; OA (osteoarthritis) of knee; Rotator cuff tear arthropathy of right shoulder; SCC (squamous cell carcinoma); Palpitations; Osteoarthritis of right hip; History of total hip arthroplasty, right; Aneurysm of ascending aorta (HCC); Coronary artery disease involving native coronary artery of native heart without angina pectoris; Essential hypertension; OSA (obstructive sleep apnea); and Status post reverse total arthroplasty of right shoulder on their problem list.  Here with his wife who provides much information. He has always been so quick. Decline after covid last August and since then felt foggy and getting better. He feels like he is getting beter. Driving reflexes are worse, slower, more short-term memory. Compliant with his cpap. Forgetting names. Involved more socially, very quick witted, he is "coming back" he does have depression but says it is improved. He also got his hearing aids adjusted. Wellbutrin has made him angry. They cut back, feels depression is well controlled and his anger is better. I would talk to primary care. No Fhx of dementia, father died at 21 for brain tumor, mother died of copd in 8s, siblings without memory problems. Wife is here and provides information. Dtop in the wellbutrin has helped as has the hearing aids, increased social interactions. Not getting lost. Doing all IADLS and ADLs. No other focal neurologic deficits, associated symptoms, inciting events or modifiable factors.  Reviewed notes, labs and imaging from outside  physicians, which showed:  MRI brain 12/23/2022: 1.  No evidence of an acute intracranial abnormality. 2. Mild chronic small vessel ischemic changes within the cerebral white matter, slightly progressed from the prior brain MRI of 05/10/2018. 3. Mild generalized parenchymal atrophy.  B12,tsh nml    Review of Systems: Patient complains of symptoms per HPI as well as the following symptoms memory loss. Pertinent negatives and positives per HPI. All others negative.   Social History   Socioeconomic History   Marital status: Married    Spouse name: Not on file   Number of children: 2   Years of education: Not on file   Highest education level: Bachelor's degree (e.g., BA, AB, BS)  Occupational History   Occupation: Retired  Tobacco Use   Smoking status: Former    Packs/day: 0.50    Years: 3.00    Additional pack years: 0.00    Total pack years: 1.50    Types: Cigarettes    Quit date: 1971    Years since quitting: 53.4   Smokeless tobacco: Never  Vaping Use   Vaping Use: Never used  Substance and Sexual Activity   Alcohol use: Yes    Alcohol/week: 3.0 standard drinks of alcohol    Types: 3 Cans of beer per week   Drug use: No    Comment: former user of marijuana, quit in 1971   Sexual activity: Yes  Other Topics Concern   Not on file  Social History Narrative   Lives with his wife.   Education: College   Exercise: No   Drinks about 2 large coffees per day   Right handed      Spent many years in [place].  Moved  to [place] to be near their children.  Daughter lives in [place].  Their son has lived in the [place] area with plans to move to [place] in summer 2019.   Social Determinants of Health   Financial Resource Strain: Low Risk  (08/08/2017)   Overall Financial Resource Strain (CARDIA)    Difficulty of Paying Living Expenses: Not hard at all  Food Insecurity: No Food Insecurity (08/08/2017)   Hunger Vital Sign    Worried About Running Out of Food in the Last  Year: Never true    Ran Out of Food in the Last Year: Never true  Transportation Needs: No Transportation Needs (08/08/2017)   PRAPARE - Administrator, Civil Service (Medical): No    Lack of Transportation (Non-Medical): No  Physical Activity: Inactive (08/08/2017)   Exercise Vital Sign    Days of Exercise per Week: 0 days    Minutes of Exercise per Session: 0 min  Stress: No Stress Concern Present (08/08/2017)   [employer] of Occupational Health - Occupational Stress Questionnaire    Feeling of Stress : Not at all  Social Connections: Socially Integrated (08/08/2017)   Social Connection and Isolation Panel [NHANES]    Frequency of Communication with Friends and Family: More than three times a week    Frequency of Social Gatherings with Friends and Family: Once a week    Attends Religious Services: More than 4 times per year    Active Member of [employer] or Organizations: Yes    Attends Engineer, structural: More than 4 times per year    Marital Status: Married  Catering manager Violence: Not At Risk (08/08/2017)   Humiliation, Afraid, Rape, and Kick questionnaire    Fear of Current or Ex-Partner: No    Emotionally Abused: No    Physically Abused: No    Sexually Abused: No    Family History  Problem Relation Age of Onset   Cancer Mother        throat   Hypertension Mother    Heart disease Mother    Emphysema Mother    Hypertension Father    Cancer Father        Brain   Hypertension Sister    Hematuria Sister    Hematuria Brother    Alzheimer's disease Neg Hx    Dementia Neg Hx     Past Medical History:  Diagnosis Date   Arthritis    CAD (coronary artery disease)    Chronic kidney disease    Depression    Hand pain    currently being evaluated by rheumatology for dx    Hematuria    Hypertension    Rapid heart beat    rpeorts  " 2 months ago i had a rapid heart beat that lasted 45 min to an hour, ive experience this twice in the last t10  years but only seconds long" " i was evaluated by cardiology Dr Marilynne Halsted who did a 30 day monitor and ECHO and said i have occasional [PATS] ? , denies chest pain nor LOC  during these episodes; HR today is WDL    Sleep apnea    CPAP     Patient Active Problem List   Diagnosis Date Noted   Status post reverse total arthroplasty of right shoulder 01/20/2023   OSA (obstructive sleep apnea) 06/01/2019   Aneurysm of ascending aorta (HCC) 05/18/2019   Coronary artery disease involving native coronary artery of native heart without angina pectoris 05/18/2019  Essential hypertension 05/18/2019   History of total hip arthroplasty, right 08/14/2018   Osteoarthritis of right hip 08/12/2018   Palpitations 01/29/2018   SCC (squamous cell carcinoma) 01/28/2018   Solitary pulmonary nodule 10/03/2016   Cough 09/29/2016   Seborrheic keratosis 02/26/2014   Rotator cuff tear arthropathy of right shoulder 01/10/2014   Knee joint replacement by other means 11/14/2012   S/P total knee arthroplasty 10/05/2011   Hip joint replacement by other means 03/01/2011   OA (osteoarthritis) of knee 03/01/2011   CKD (chronic kidney disease) 05/13/2010    Past Surgical History:  Procedure Laterality Date   CATARACT EXTRACTION, BILATERAL     HIP SURGERY Left 2012   JOINT REPLACEMENT     REPLACEMENT TOTAL KNEE Right    2001,2006,2009,2013   REPLACEMENT TOTAL KNEE Left 2011   REVERSE SHOULDER ARTHROPLASTY Right 01/20/2023   Procedure: REVERSE SHOULDER ARTHROPLASTY;  Surgeon: Bjorn Pippin, MD;  Location: [facility];  Service: Orthopedics;  Laterality: Right;   TOTAL HIP ARTHROPLASTY Right 08/14/2018   Procedure: TOTAL HIP ARTHROPLASTY ANTERIOR APPROACH;  Surgeon: Gean Birchwood, MD;  Location: [facility];  Service: Orthopedics;  Laterality: Right;    Current Outpatient Medications  Medication Sig Dispense Refill   aspirin (ASPIRIN CHILDRENS) 81 MG chewable tablet Chew 1 tablet (81 mg total) by mouth  daily. For 4 weeks for DVT prophylaxis after surgery 30 tablet 0   atenolol (TENORMIN) 25 MG tablet TAKE 1 TABLET(25 MG) BY MOUTH DAILY 90 tablet 2   atorvastatin (LIPITOR) 20 MG tablet Take 1 tablet (20 mg total) by mouth daily. 90 tablet 3   buPROPion ER (WELLBUTRIN SR) 100 MG 12 hr tablet Take 1 tablet (100 mg total) by mouth daily for 3 days, as tolereted, increase to 1 tablet twice a day 60 tablet 0   hydrochlorothiazide (HYDRODIURIL) 12.5 MG tablet Take 1 tablet by mouth once daily in the morning 90 tablet 1   oxyCODONE (OXY IR/ROXICODONE) 5 MG immediate release tablet Take 1-2 tablets (5-10 mg total) by mouth every 6 (six) hours as needed for severe pain (max 6 tablets daily) 30 tablet 0   pantoprazole (PROTONIX) 20 MG tablet Take 1 tablet (20 mg total) by mouth daily. 90 tablet 3   sodium bicarbonate 650 MG tablet Take 1 tablet (650 mg total) by mouth 2 (two) times daily. 180 tablet 3   tizanidine (ZANAFLEX) 2 MG capsule Take 1 capsule (2 mg total) by mouth every 8 (eight) hours as needed for muscle spasms. (Patient taking differently: Take 2 mg by mouth as needed for muscle spasms.) 30 capsule 0   No current facility-administered medications for this visit.    Allergies as of 02/07/2023 - Review Complete 02/07/2023  Allergen Reaction Noted   Nsaids  08/14/2018   Dilaudid [hydromorphone hcl] Other (See Comments) 02/03/2015   Sulfa antibiotics  02/27/2020    Vitals: BP 113/61   Pulse (!) 57   Ht 5\' 10"  (1.778 m)   Wt 213 lb (96.6 kg)   BMI 30.56 kg/m  Last Weight:  Wt Readings from Last 1 Encounters:  02/07/23 213 lb (96.6 kg)   Last Height:   Ht Readings from Last 1 Encounters:  02/07/23 5\' 10"  (1.778 m)     Physical exam: Exam: Gen: NAD, conversant, well nourised, obese, well groomed                     CV: RRR, no MRG. No Carotid Bruits. No peripheral  edema, warm, nontender Eyes: Conjunctivae clear without exudates or hemorrhage  Neuro: Detailed Neurologic  Exam  Speech:    Speech is normal; fluent and spontaneous with normal comprehension.  Cognition:     02/07/2023    9:28 AM  MMSE - Mini Mental State Exam  Orientation to time 5  Orientation to Place 5  Registration 3  Attention/ Calculation 5  Recall 3  Language- name 2 objects 2  Language- repeat 1  Language- follow 3 step command 3  Language- read & follow direction 1  Write a sentence 1  Copy design 1  Total score 30      02/07/2023   10:12 AM  Montreal Cognitive Assessment   Visuospatial/ Executive (0/5) 5  Naming (0/3) 3  Attention: Read list of digits (0/2) 2  Attention: Read list of letters (0/1) 1  Attention: Serial 7 subtraction starting at 100 (0/3) 3  Language: Repeat phrase (0/2) 2  Language : Fluency (0/1) 0  Abstraction (0/2) 2  Delayed Recall (0/5) 2  Orientation (0/6) 6  Total 26       The patient is oriented to person, place, and time;     recent and remote memory intact;     language fluent;     normal attention, concentration,     fund of knowledge Cranial Nerves:    The pupils are equal, round, and reactive to light. Attempted fundoscopic exam could not visualize. Visual fields are full to finger confrontation. Extraocular movements are intact. Trigeminal sensation is intact and the muscles of mastication are normal. The face is symmetric. The palate elevates in the midline. Hearing intact. Voice is normal. Shoulder shrug is normal. The tongue has normal motion without fasciculations.   Coordination:    Normal finger to nose and heel to shin. Normal rapid alternating movements.   Gait: Not parkinsonian; Walks with left leg turned out   Motor Observation:    No asymmetry, no atrophy, and no involuntary movements noted. right arm in cast deferred exam; Hammer toes Tone:    Normal muscle tone.    Posture:    Posture is normal. normal erect    Strength:    Strength is V/V in the upper and lower limbs. right arm in cast deferred exam      Sensation: intact to LT. right arm in cast deferred exam     Reflex Exam:  DTR's: ' Trace AJs and patellars(replacements knees).  right arm in cast deferred exam Toes:    The toes are downgoing bilaterally.   Clonus:    Clonus is absent.    Assessment/Plan:  77 y.o. male here as requested by Charlane Ferretti, DO for memory loss. has Cough; Solitary pulmonary nodule; CKD (chronic kidney disease); Seborrheic keratosis; Knee joint replacement by other means; Hip joint replacement by other means; S/P total knee arthroplasty; OA (osteoarthritis) of knee; Rotator cuff tear arthropathy of right shoulder; SCC (squamous cell carcinoma); Palpitations; Osteoarthritis of right hip; History of total hip arthroplasty, right; Aneurysm of ascending aorta (HCC); Coronary artery disease involving native coronary artery of native heart without angina pectoris; Essential hypertension; OSA (obstructive sleep apnea); and Status post reverse total arthroplasty of right shoulder on their problem list.  MMSE 30/30, MoCA 26/30. I don;t appreciate a neurodegenerative issue but given his concerns we decided to order a baseline formal memory test and if anything is concerning we can continue to a more thorough evaluation Discussed: Recommendations to prevent or slow progression of cognitive decline:  Orders Placed This Encounter  Procedures   Vitamin B1   Ambulatory referral to Neuropsychology    Cc: Charlane Ferretti, DO,  Charlane Ferretti, DO  Naomie Dean, MD  [facility] [address] [place], [place] [address]  Phone [phone] Fax [phone]

## 2023-02-08 ENCOUNTER — Encounter: Payer: Self-pay | Admitting: Neurology

## 2023-02-08 DIAGNOSIS — M25611 Stiffness of right shoulder, not elsewhere classified: Secondary | ICD-10-CM | POA: Diagnosis not present

## 2023-02-08 DIAGNOSIS — M19011 Primary osteoarthritis, right shoulder: Secondary | ICD-10-CM | POA: Diagnosis not present

## 2023-02-08 DIAGNOSIS — S46011D Strain of muscle(s) and tendon(s) of the rotator cuff of right shoulder, subsequent encounter: Secondary | ICD-10-CM | POA: Diagnosis not present

## 2023-02-08 DIAGNOSIS — M6281 Muscle weakness (generalized): Secondary | ICD-10-CM | POA: Diagnosis not present

## 2023-02-09 ENCOUNTER — Telehealth: Payer: Self-pay | Admitting: Neurology

## 2023-02-09 NOTE — Telephone Encounter (Signed)
Referral faxed to Dr. Minus Liberty office: [phone]   Fax: [phone]

## 2023-02-10 LAB — VITAMIN B1: Thiamine: 138.7 nmol/L (ref 66.5–200.0)

## 2023-02-15 DIAGNOSIS — M6281 Muscle weakness (generalized): Secondary | ICD-10-CM | POA: Diagnosis not present

## 2023-02-15 DIAGNOSIS — M25611 Stiffness of right shoulder, not elsewhere classified: Secondary | ICD-10-CM | POA: Diagnosis not present

## 2023-02-15 DIAGNOSIS — S46011D Strain of muscle(s) and tendon(s) of the rotator cuff of right shoulder, subsequent encounter: Secondary | ICD-10-CM | POA: Diagnosis not present

## 2023-02-15 DIAGNOSIS — M19011 Primary osteoarthritis, right shoulder: Secondary | ICD-10-CM | POA: Diagnosis not present

## 2023-02-17 ENCOUNTER — Other Ambulatory Visit (HOSPITAL_COMMUNITY): Payer: Self-pay

## 2023-02-17 DIAGNOSIS — M19011 Primary osteoarthritis, right shoulder: Secondary | ICD-10-CM | POA: Diagnosis not present

## 2023-02-18 ENCOUNTER — Other Ambulatory Visit (HOSPITAL_COMMUNITY): Payer: Self-pay

## 2023-02-18 ENCOUNTER — Other Ambulatory Visit: Payer: Self-pay

## 2023-02-23 DIAGNOSIS — D692 Other nonthrombocytopenic purpura: Secondary | ICD-10-CM | POA: Diagnosis not present

## 2023-02-23 DIAGNOSIS — D225 Melanocytic nevi of trunk: Secondary | ICD-10-CM | POA: Diagnosis not present

## 2023-02-23 DIAGNOSIS — L570 Actinic keratosis: Secondary | ICD-10-CM | POA: Diagnosis not present

## 2023-02-23 DIAGNOSIS — L814 Other melanin hyperpigmentation: Secondary | ICD-10-CM | POA: Diagnosis not present

## 2023-02-23 DIAGNOSIS — L821 Other seborrheic keratosis: Secondary | ICD-10-CM | POA: Diagnosis not present

## 2023-02-23 DIAGNOSIS — Z85828 Personal history of other malignant neoplasm of skin: Secondary | ICD-10-CM | POA: Diagnosis not present

## 2023-02-24 DIAGNOSIS — M6281 Muscle weakness (generalized): Secondary | ICD-10-CM | POA: Diagnosis not present

## 2023-02-24 DIAGNOSIS — M19011 Primary osteoarthritis, right shoulder: Secondary | ICD-10-CM | POA: Diagnosis not present

## 2023-02-24 DIAGNOSIS — M25611 Stiffness of right shoulder, not elsewhere classified: Secondary | ICD-10-CM | POA: Diagnosis not present

## 2023-02-24 DIAGNOSIS — S46011D Strain of muscle(s) and tendon(s) of the rotator cuff of right shoulder, subsequent encounter: Secondary | ICD-10-CM | POA: Diagnosis not present

## 2023-02-28 DIAGNOSIS — M6281 Muscle weakness (generalized): Secondary | ICD-10-CM | POA: Diagnosis not present

## 2023-02-28 DIAGNOSIS — M25611 Stiffness of right shoulder, not elsewhere classified: Secondary | ICD-10-CM | POA: Diagnosis not present

## 2023-02-28 DIAGNOSIS — M19011 Primary osteoarthritis, right shoulder: Secondary | ICD-10-CM | POA: Diagnosis not present

## 2023-02-28 DIAGNOSIS — S46011D Strain of muscle(s) and tendon(s) of the rotator cuff of right shoulder, subsequent encounter: Secondary | ICD-10-CM | POA: Diagnosis not present

## 2023-03-10 ENCOUNTER — Other Ambulatory Visit (HOSPITAL_COMMUNITY): Payer: Self-pay

## 2023-03-10 DIAGNOSIS — M19011 Primary osteoarthritis, right shoulder: Secondary | ICD-10-CM | POA: Diagnosis not present

## 2023-03-11 DIAGNOSIS — M6281 Muscle weakness (generalized): Secondary | ICD-10-CM | POA: Diagnosis not present

## 2023-03-11 DIAGNOSIS — S46011D Strain of muscle(s) and tendon(s) of the rotator cuff of right shoulder, subsequent encounter: Secondary | ICD-10-CM | POA: Diagnosis not present

## 2023-03-11 DIAGNOSIS — M25611 Stiffness of right shoulder, not elsewhere classified: Secondary | ICD-10-CM | POA: Diagnosis not present

## 2023-03-11 DIAGNOSIS — M19011 Primary osteoarthritis, right shoulder: Secondary | ICD-10-CM | POA: Diagnosis not present

## 2023-03-12 ENCOUNTER — Other Ambulatory Visit (HOSPITAL_COMMUNITY): Payer: Self-pay

## 2023-03-14 ENCOUNTER — Other Ambulatory Visit (HOSPITAL_COMMUNITY): Payer: Self-pay

## 2023-03-14 DIAGNOSIS — M19011 Primary osteoarthritis, right shoulder: Secondary | ICD-10-CM | POA: Diagnosis not present

## 2023-03-14 DIAGNOSIS — M25611 Stiffness of right shoulder, not elsewhere classified: Secondary | ICD-10-CM | POA: Diagnosis not present

## 2023-03-14 DIAGNOSIS — S46011D Strain of muscle(s) and tendon(s) of the rotator cuff of right shoulder, subsequent encounter: Secondary | ICD-10-CM | POA: Diagnosis not present

## 2023-03-14 DIAGNOSIS — M6281 Muscle weakness (generalized): Secondary | ICD-10-CM | POA: Diagnosis not present

## 2023-03-15 ENCOUNTER — Other Ambulatory Visit (HOSPITAL_COMMUNITY): Payer: Self-pay

## 2023-03-15 MED ORDER — BUPROPION HCL ER (SR) 100 MG PO TB12
ORAL_TABLET | ORAL | 1 refills | Status: AC
  Filled 2023-03-15: qty 90, 90d supply, fill #0
  Filled 2023-06-08 – 2023-06-28 (×2): qty 90, 90d supply, fill #1

## 2023-03-17 DIAGNOSIS — Z125 Encounter for screening for malignant neoplasm of prostate: Secondary | ICD-10-CM | POA: Diagnosis not present

## 2023-03-17 DIAGNOSIS — N1831 Chronic kidney disease, stage 3a: Secondary | ICD-10-CM | POA: Diagnosis not present

## 2023-03-17 DIAGNOSIS — K219 Gastro-esophageal reflux disease without esophagitis: Secondary | ICD-10-CM | POA: Diagnosis not present

## 2023-03-17 DIAGNOSIS — N529 Male erectile dysfunction, unspecified: Secondary | ICD-10-CM | POA: Diagnosis not present

## 2023-03-17 DIAGNOSIS — E785 Hyperlipidemia, unspecified: Secondary | ICD-10-CM | POA: Diagnosis not present

## 2023-03-17 DIAGNOSIS — I10 Essential (primary) hypertension: Secondary | ICD-10-CM | POA: Diagnosis not present

## 2023-03-18 DIAGNOSIS — M19011 Primary osteoarthritis, right shoulder: Secondary | ICD-10-CM | POA: Diagnosis not present

## 2023-03-18 DIAGNOSIS — M6281 Muscle weakness (generalized): Secondary | ICD-10-CM | POA: Diagnosis not present

## 2023-03-18 DIAGNOSIS — M25611 Stiffness of right shoulder, not elsewhere classified: Secondary | ICD-10-CM | POA: Diagnosis not present

## 2023-03-18 DIAGNOSIS — S46011D Strain of muscle(s) and tendon(s) of the rotator cuff of right shoulder, subsequent encounter: Secondary | ICD-10-CM | POA: Diagnosis not present

## 2023-03-23 ENCOUNTER — Ambulatory Visit: Payer: Medicare Other | Admitting: Neurology

## 2023-03-23 ENCOUNTER — Telehealth: Payer: Self-pay | Admitting: Neurology

## 2023-03-23 ENCOUNTER — Ambulatory Visit: Payer: Medicare Other | Admitting: Adult Health

## 2023-03-23 NOTE — Telephone Encounter (Signed)
Mychart

## 2023-03-24 ENCOUNTER — Ambulatory Visit (INDEPENDENT_AMBULATORY_CARE_PROVIDER_SITE_OTHER): Payer: Medicare Other | Admitting: Adult Health

## 2023-03-24 ENCOUNTER — Other Ambulatory Visit (HOSPITAL_COMMUNITY): Payer: Self-pay

## 2023-03-24 ENCOUNTER — Encounter: Payer: Self-pay | Admitting: Adult Health

## 2023-03-24 VITALS — BP 109/66 | HR 83 | Ht 70.0 in | Wt 215.0 lb

## 2023-03-24 DIAGNOSIS — M1990 Unspecified osteoarthritis, unspecified site: Secondary | ICD-10-CM | POA: Diagnosis not present

## 2023-03-24 DIAGNOSIS — I10 Essential (primary) hypertension: Secondary | ICD-10-CM | POA: Diagnosis not present

## 2023-03-24 DIAGNOSIS — D692 Other nonthrombocytopenic purpura: Secondary | ICD-10-CM | POA: Diagnosis not present

## 2023-03-24 DIAGNOSIS — N1831 Chronic kidney disease, stage 3a: Secondary | ICD-10-CM | POA: Diagnosis not present

## 2023-03-24 DIAGNOSIS — E875 Hyperkalemia: Secondary | ICD-10-CM | POA: Diagnosis not present

## 2023-03-24 DIAGNOSIS — I25118 Atherosclerotic heart disease of native coronary artery with other forms of angina pectoris: Secondary | ICD-10-CM | POA: Diagnosis not present

## 2023-03-24 DIAGNOSIS — Z1331 Encounter for screening for depression: Secondary | ICD-10-CM | POA: Diagnosis not present

## 2023-03-24 DIAGNOSIS — G4733 Obstructive sleep apnea (adult) (pediatric): Secondary | ICD-10-CM

## 2023-03-24 DIAGNOSIS — E785 Hyperlipidemia, unspecified: Secondary | ICD-10-CM | POA: Diagnosis not present

## 2023-03-24 DIAGNOSIS — Z0000 Encounter for general adult medical examination without abnormal findings: Secondary | ICD-10-CM | POA: Diagnosis not present

## 2023-03-24 DIAGNOSIS — I7121 Aneurysm of the ascending aorta, without rupture: Secondary | ICD-10-CM | POA: Diagnosis not present

## 2023-03-24 DIAGNOSIS — I700 Atherosclerosis of aorta: Secondary | ICD-10-CM | POA: Diagnosis not present

## 2023-03-24 DIAGNOSIS — R053 Chronic cough: Secondary | ICD-10-CM | POA: Diagnosis not present

## 2023-03-24 DIAGNOSIS — Z1389 Encounter for screening for other disorder: Secondary | ICD-10-CM | POA: Diagnosis not present

## 2023-03-24 DIAGNOSIS — M85821 Other specified disorders of bone density and structure, right upper arm: Secondary | ICD-10-CM | POA: Diagnosis not present

## 2023-03-24 DIAGNOSIS — R82998 Other abnormal findings in urine: Secondary | ICD-10-CM | POA: Diagnosis not present

## 2023-03-24 MED ORDER — ATENOLOL 25 MG PO TABS
25.0000 mg | ORAL_TABLET | Freq: Every day | ORAL | 3 refills | Status: DC
  Filled 2023-03-24: qty 90, 90d supply, fill #0
  Filled 2023-04-06 – 2023-05-30 (×3): qty 90, 90d supply, fill #1
  Filled 2023-06-28 – 2023-08-27 (×3): qty 90, 90d supply, fill #2
  Filled 2023-09-26 – 2023-11-26 (×2): qty 90, 90d supply, fill #3

## 2023-03-24 MED ORDER — LOSARTAN POTASSIUM 100 MG PO TABS
100.0000 mg | ORAL_TABLET | Freq: Every day | ORAL | 3 refills | Status: DC
  Filled 2023-03-24: qty 90, 90d supply, fill #0
  Filled 2023-04-06 – 2023-05-30 (×3): qty 90, 90d supply, fill #1
  Filled 2023-06-28 – 2023-08-27 (×3): qty 90, 90d supply, fill #2
  Filled 2023-09-26 – 2023-11-26 (×2): qty 90, 90d supply, fill #3

## 2023-03-24 MED ORDER — BUPROPION HCL ER (SR) 100 MG PO TB12
100.0000 mg | ORAL_TABLET | Freq: Two times a day (BID) | ORAL | 5 refills | Status: DC
  Filled 2023-04-06 – 2023-04-20 (×2): qty 60, 30d supply, fill #0
  Filled 2023-05-18: qty 60, 30d supply, fill #1
  Filled 2023-08-09: qty 60, 30d supply, fill #2
  Filled 2023-08-27: qty 60, 30d supply, fill #3
  Filled 2023-10-04: qty 60, 30d supply, fill #4
  Filled 2023-11-11: qty 60, 30d supply, fill #5

## 2023-03-25 DIAGNOSIS — Z23 Encounter for immunization: Secondary | ICD-10-CM | POA: Diagnosis not present

## 2023-03-29 DIAGNOSIS — S46011D Strain of muscle(s) and tendon(s) of the rotator cuff of right shoulder, subsequent encounter: Secondary | ICD-10-CM | POA: Diagnosis not present

## 2023-03-29 DIAGNOSIS — M19011 Primary osteoarthritis, right shoulder: Secondary | ICD-10-CM | POA: Diagnosis not present

## 2023-03-29 DIAGNOSIS — M6281 Muscle weakness (generalized): Secondary | ICD-10-CM | POA: Diagnosis not present

## 2023-03-29 DIAGNOSIS — M25611 Stiffness of right shoulder, not elsewhere classified: Secondary | ICD-10-CM | POA: Diagnosis not present

## 2023-04-04 ENCOUNTER — Other Ambulatory Visit (HOSPITAL_COMMUNITY): Payer: Self-pay

## 2023-04-04 DIAGNOSIS — M19011 Primary osteoarthritis, right shoulder: Secondary | ICD-10-CM | POA: Diagnosis not present

## 2023-04-05 ENCOUNTER — Other Ambulatory Visit (HOSPITAL_COMMUNITY): Payer: Self-pay

## 2023-04-05 MED ORDER — METHYLPREDNISOLONE 4 MG PO TABS
ORAL_TABLET | ORAL | 0 refills | Status: DC
  Filled 2023-04-05: qty 21, 6d supply, fill #0

## 2023-04-06 ENCOUNTER — Other Ambulatory Visit (HOSPITAL_COMMUNITY): Payer: Self-pay

## 2023-04-06 ENCOUNTER — Other Ambulatory Visit: Payer: Self-pay

## 2023-04-07 ENCOUNTER — Other Ambulatory Visit (HOSPITAL_COMMUNITY): Payer: Self-pay

## 2023-04-11 DIAGNOSIS — S46011D Strain of muscle(s) and tendon(s) of the rotator cuff of right shoulder, subsequent encounter: Secondary | ICD-10-CM | POA: Diagnosis not present

## 2023-04-11 DIAGNOSIS — M19011 Primary osteoarthritis, right shoulder: Secondary | ICD-10-CM | POA: Diagnosis not present

## 2023-04-11 DIAGNOSIS — M25611 Stiffness of right shoulder, not elsewhere classified: Secondary | ICD-10-CM | POA: Diagnosis not present

## 2023-04-11 DIAGNOSIS — M6281 Muscle weakness (generalized): Secondary | ICD-10-CM | POA: Diagnosis not present

## 2023-04-12 ENCOUNTER — Other Ambulatory Visit: Payer: Self-pay | Admitting: Orthopaedic Surgery

## 2023-04-12 DIAGNOSIS — M19011 Primary osteoarthritis, right shoulder: Secondary | ICD-10-CM | POA: Diagnosis not present

## 2023-04-12 DIAGNOSIS — Z01818 Encounter for other preprocedural examination: Secondary | ICD-10-CM

## 2023-04-18 DIAGNOSIS — M6281 Muscle weakness (generalized): Secondary | ICD-10-CM | POA: Diagnosis not present

## 2023-04-18 DIAGNOSIS — M19011 Primary osteoarthritis, right shoulder: Secondary | ICD-10-CM | POA: Diagnosis not present

## 2023-04-18 DIAGNOSIS — M25611 Stiffness of right shoulder, not elsewhere classified: Secondary | ICD-10-CM | POA: Diagnosis not present

## 2023-04-18 DIAGNOSIS — S46011D Strain of muscle(s) and tendon(s) of the rotator cuff of right shoulder, subsequent encounter: Secondary | ICD-10-CM | POA: Diagnosis not present

## 2023-04-19 ENCOUNTER — Other Ambulatory Visit: Payer: Medicare Other

## 2023-04-19 DIAGNOSIS — L814 Other melanin hyperpigmentation: Secondary | ICD-10-CM | POA: Diagnosis not present

## 2023-04-19 DIAGNOSIS — D1801 Hemangioma of skin and subcutaneous tissue: Secondary | ICD-10-CM | POA: Diagnosis not present

## 2023-04-19 DIAGNOSIS — D225 Melanocytic nevi of trunk: Secondary | ICD-10-CM | POA: Diagnosis not present

## 2023-04-19 DIAGNOSIS — L570 Actinic keratosis: Secondary | ICD-10-CM | POA: Diagnosis not present

## 2023-04-19 DIAGNOSIS — Z85828 Personal history of other malignant neoplasm of skin: Secondary | ICD-10-CM | POA: Diagnosis not present

## 2023-04-19 DIAGNOSIS — L821 Other seborrheic keratosis: Secondary | ICD-10-CM | POA: Diagnosis not present

## 2023-04-19 DIAGNOSIS — D692 Other nonthrombocytopenic purpura: Secondary | ICD-10-CM | POA: Diagnosis not present

## 2023-04-20 ENCOUNTER — Other Ambulatory Visit (HOSPITAL_COMMUNITY): Payer: Self-pay

## 2023-04-23 ENCOUNTER — Other Ambulatory Visit (HOSPITAL_COMMUNITY): Payer: Self-pay

## 2023-05-17 ENCOUNTER — Ambulatory Visit
Admission: RE | Admit: 2023-05-17 | Discharge: 2023-05-17 | Disposition: A | Discharge status: Home / Self Care | Payer: Medicare Other | Source: Ambulatory Visit | Attending: Orthopaedic Surgery | Admitting: Orthopaedic Surgery

## 2023-05-17 DIAGNOSIS — M6281 Muscle weakness (generalized): Secondary | ICD-10-CM | POA: Diagnosis not present

## 2023-05-17 DIAGNOSIS — Z01818 Encounter for other preprocedural examination: Secondary | ICD-10-CM | POA: Diagnosis not present

## 2023-05-17 DIAGNOSIS — M19011 Primary osteoarthritis, right shoulder: Secondary | ICD-10-CM | POA: Diagnosis not present

## 2023-05-17 DIAGNOSIS — S46011D Strain of muscle(s) and tendon(s) of the rotator cuff of right shoulder, subsequent encounter: Secondary | ICD-10-CM | POA: Diagnosis not present

## 2023-05-17 DIAGNOSIS — M19012 Primary osteoarthritis, left shoulder: Secondary | ICD-10-CM | POA: Diagnosis not present

## 2023-05-17 DIAGNOSIS — M25611 Stiffness of right shoulder, not elsewhere classified: Secondary | ICD-10-CM | POA: Diagnosis not present

## 2023-05-17 DIAGNOSIS — M75122 Complete rotator cuff tear or rupture of left shoulder, not specified as traumatic: Secondary | ICD-10-CM | POA: Diagnosis not present

## 2023-05-18 ENCOUNTER — Other Ambulatory Visit: Payer: Self-pay

## 2023-05-18 ENCOUNTER — Other Ambulatory Visit (HOSPITAL_COMMUNITY): Payer: Self-pay

## 2023-05-18 ENCOUNTER — Encounter (HOSPITAL_COMMUNITY): Payer: Self-pay | Admitting: Pharmacist

## 2023-05-30 ENCOUNTER — Other Ambulatory Visit (HOSPITAL_COMMUNITY): Payer: Self-pay

## 2023-05-31 ENCOUNTER — Other Ambulatory Visit: Payer: Self-pay

## 2023-05-31 ENCOUNTER — Other Ambulatory Visit (HOSPITAL_COMMUNITY): Payer: Self-pay

## 2023-05-31 MED ORDER — ATORVASTATIN CALCIUM 20 MG PO TABS
20.0000 mg | ORAL_TABLET | Freq: Every day | ORAL | 3 refills | Status: DC
  Filled 2023-05-31: qty 90, 90d supply, fill #0
  Filled 2023-06-28 – 2023-08-27 (×2): qty 90, 90d supply, fill #1
  Filled 2023-09-26 – 2023-11-26 (×2): qty 90, 90d supply, fill #2
  Filled 2024-01-04 – 2024-02-27 (×3): qty 90, 90d supply, fill #3

## 2023-06-01 ENCOUNTER — Ambulatory Visit: Payer: Medicare Other | Admitting: Cardiology

## 2023-06-01 ENCOUNTER — Encounter: Payer: Self-pay | Admitting: Cardiology

## 2023-06-01 VITALS — BP 120/69 | HR 60 | Resp 16 | Ht 70.0 in | Wt 216.0 lb

## 2023-06-01 DIAGNOSIS — I7121 Aneurysm of the ascending aorta, without rupture: Secondary | ICD-10-CM | POA: Diagnosis not present

## 2023-06-01 DIAGNOSIS — I2510 Atherosclerotic heart disease of native coronary artery without angina pectoris: Secondary | ICD-10-CM | POA: Diagnosis not present

## 2023-06-01 DIAGNOSIS — I10 Essential (primary) hypertension: Secondary | ICD-10-CM

## 2023-06-01 NOTE — Progress Notes (Signed)
Follow up visit  Subjective:   Joseph Maldonado., male    DOB: November 20, 1945, 77 y.o.   MRN: [id number]   Chief Complaint  Patient presents with   Aneurysm of ascending aorta   Hypertension   Follow-up    1 year    HPI  77 y/o Caucasian male with controlled hypertension, h/o OSA s/p uvulectomy, ascending aorta aneurysm with Bovine arch, palpitations, here for follow up.  Patient is doing well from cardiac standpoint. He denies chest pain, shortness of breath, palpitations, leg edema, orthopnea, PND, TIA/syncope. Blood pressure is well controlled.   Current Outpatient Medications:    atenolol (TENORMIN) 25 MG tablet, TAKE 1 TABLET(25 MG) BY MOUTH DAILY, Disp: 90 tablet, Rfl: 2   atenolol (TENORMIN) 25 MG tablet, Take 1 tablet (25 mg total) by mouth daily, Disp: 90 tablet, Rfl: 3   atorvastatin (LIPITOR) 20 MG tablet, Take 1 tablet (20 mg total) by mouth daily., Disp: 90 tablet, Rfl: 3   buPROPion ER (WELLBUTRIN SR) 100 MG 12 hr tablet, Take 1 tablet by mouth once daily, Disp: 90 tablet, Rfl: 1   buPROPion ER (WELLBUTRIN SR) 100 MG 12 hr tablet, Take 1 tablet (100 mg total) by mouth 2 (two) times daily for 30 days., Disp: 60 tablet, Rfl: 5   losartan (COZAAR) 100 MG tablet, Take 1 tablet (100 mg) by mouth daily, Disp: 90 tablet, Rfl: 3   methylPREDNISolone (MEDROL) 4 MG tablet, Take 6 tablets by mouth on day 1 then decrease by 1 tablet each day until finished (6-5-4-3-2-1), Disp: 21 tablet, Rfl: 0   pantoprazole (PROTONIX) 20 MG tablet, Take 1 tablet (20 mg total) by mouth daily., Disp: 90 tablet, Rfl: 3   sodium bicarbonate 650 MG tablet, Take 1 tablet (650 mg total) by mouth 2 (two) times daily., Disp: 180 tablet, Rfl: 3   tizanidine (ZANAFLEX) 2 MG capsule, Take 1 capsule (2 mg total) by mouth every 8 (eight) hours as needed for muscle spasms. (Patient taking differently: Take 2 mg by mouth as needed for muscle spasms.), Disp: 30 capsule, Rfl: 0  Cardiovascular studies:  EKG  06/01/2023: Sinus rhythm 57 bpm RSR(V1) -nondiagnostic  Echocardiogram 05/21/2022:  Normal LV systolic function with visual EF 60-65%. Left ventricle cavity  is normal in size. Mild concentric remodeling of the left ventricle.  Normal global wall motion. Normal diastolic filling pattern, normal LAP.  Mild left ventricular hypertrophy  Native trileaflet aortic valve. No evidence of aortic stenosis. Trace  aortic regurgitation.  Trace tricuspid regurgitation. No evidence of pulmonary hypertension.  The aortic root is dilated at 42mm.  Proximal ascending aorta is not well  visualized.  Compared to 05/04/2021: Grade I DD is now normal, moderate AR is now  trivial, aortic root was 38mm now 42mm.   CTA chest and coronary CTA 06/20/2018: Calcium score 888 LM: Normal LAD: 50% or less calcified plaque in prox and mid LAD      <50% calcific plaque distally      D1: <50% ostial calcific plaque      D2: Normal LCx: <50% calcific plaque prox and mid vessel      OM1: <50% calcific plaque      AV grove: Normal RCA: <50% calcific plaque prox, mid, and distal vessel.      PDA: 50% calcific plaque on PLB and PDA  Aortic root: dilated Sinus: 3.7 cm STJ: 3.3 cm Aortic root: 4.2 cm Descending thoracic aorta 2.7 cm  Impression: Calcium score 80th percentile Nonobstructive  three vessel CAD Aortic root dilatation 4.2 cm with Bovine arch  Echocardiogram 04/19/2018: Left ventricle cavity is normal in size. Mild concentric hypertrophy of the left ventricle. Normal global wall motion. Calculated EF 69%. Normal diastolic filling pattern. Calcification seen on noncoronary cusp. Moderate (Grade II) aortic regurgitation. Mild tricuspid regurgitation.  No evidence of pulmonary hypertension.  The aortic root is dilated, measuring 4.0 cm at sinotubular junction.  Event monitor 02/17/2018 - 03/18/2018: Sinus rhythm.  No arrhythmias.  No symptoms reported.  Recent labs: 2023: Glucose 87, BUN/Cr 45/1.5.  EGFR 49. Chol 132, TG 62, HDL 51, LDL 69   Review of Systems  Cardiovascular:  Negative for chest pain, dyspnea on exertion, leg swelling, palpitations and syncope.        Vitals:   06/01/23 1514  BP: 120/69  Pulse: 60  Resp: 16  SpO2: 96%     Body mass index is 30.99 kg/m. Filed Weights   06/01/23 1514  Weight: 216 lb (98 kg)      Objective:   Physical Exam Vitals and nursing note reviewed.  Constitutional:      General: He is not in acute distress. Neck:     Vascular: No JVD.  Cardiovascular:     Rate and Rhythm: Normal rate and regular rhythm.     Heart sounds: Normal heart sounds. No murmur heard. Pulmonary:     Effort: Pulmonary effort is normal.     Breath sounds: Normal breath sounds. No wheezing or rales.  Musculoskeletal:     Right lower leg: No edema.     Left lower leg: No edema.         Assessment & Recommendations:   77 y/o Caucasian male with controlled hypertension, h/o OSA s/p uvulectomy, ascending aorta aneurysm, palpitations, here for follow up.  Ascending aorta aneurysm, grade II AI: Aortic root 3.8-4.2 cm. Stable Continue on losartan 100 mg, atenolol 25 mg daily. Repeat echocardiogram now and 04/2024.  Nonobstructive CAD: Continue aspirin/statin.  Hypertension: Well controlled on atenolol 25 mg, losartan 100 mg.  CKD: F/u w/nephrology.  Pulmonary nodule: Reportedly, known to the patient. Follow up with Dr. Neva Seat.  F/u in 1 year after echcoardiogram   Elder Negus, MD Pager: [phone] Office: [phone]

## 2023-06-03 ENCOUNTER — Other Ambulatory Visit (HOSPITAL_COMMUNITY): Payer: Self-pay

## 2023-06-03 ENCOUNTER — Ambulatory Visit (HOSPITAL_COMMUNITY): Payer: Medicare Other | Attending: Cardiology

## 2023-06-03 DIAGNOSIS — I7121 Aneurysm of the ascending aorta, without rupture: Secondary | ICD-10-CM | POA: Diagnosis not present

## 2023-06-06 DIAGNOSIS — I089 Rheumatic multiple valve disease, unspecified: Secondary | ICD-10-CM | POA: Diagnosis not present

## 2023-06-06 LAB — ECHOCARDIOGRAM COMPLETE
Area-P 1/2: 1.87 cm2
Calc EF: 55.5 %
P 1/2 time: 892 ms
S' Lateral: 2.7 cm
Single Plane A2C EF: 52.9 %
Single Plane A4C EF: 58.2 %

## 2023-06-08 ENCOUNTER — Other Ambulatory Visit (HOSPITAL_COMMUNITY): Payer: Self-pay

## 2023-06-08 MED ORDER — ALPRAZOLAM 1 MG PO TABS
ORAL_TABLET | ORAL | 0 refills | Status: DC
  Filled 2023-06-08: qty 1, 1d supply, fill #0

## 2023-06-09 ENCOUNTER — Other Ambulatory Visit (HOSPITAL_COMMUNITY): Payer: Self-pay

## 2023-06-10 ENCOUNTER — Other Ambulatory Visit (HOSPITAL_COMMUNITY): Payer: Self-pay

## 2023-06-11 ENCOUNTER — Other Ambulatory Visit: Payer: Self-pay

## 2023-06-12 ENCOUNTER — Other Ambulatory Visit (HOSPITAL_COMMUNITY): Payer: Self-pay

## 2023-06-13 ENCOUNTER — Other Ambulatory Visit (HOSPITAL_COMMUNITY): Payer: Self-pay

## 2023-06-18 ENCOUNTER — Other Ambulatory Visit (HOSPITAL_COMMUNITY): Payer: Self-pay

## 2023-06-28 ENCOUNTER — Other Ambulatory Visit: Payer: Self-pay

## 2023-06-28 ENCOUNTER — Other Ambulatory Visit (HOSPITAL_COMMUNITY): Payer: Self-pay

## 2023-07-01 DIAGNOSIS — Z23 Encounter for immunization: Secondary | ICD-10-CM | POA: Diagnosis not present

## 2023-07-04 DIAGNOSIS — Z23 Encounter for immunization: Secondary | ICD-10-CM | POA: Diagnosis not present

## 2023-07-06 ENCOUNTER — Other Ambulatory Visit (HOSPITAL_COMMUNITY): Payer: Self-pay

## 2023-07-17 ENCOUNTER — Other Ambulatory Visit (HOSPITAL_COMMUNITY): Payer: Self-pay

## 2023-07-17 DIAGNOSIS — M25561 Pain in right knee: Secondary | ICD-10-CM | POA: Diagnosis not present

## 2023-07-17 MED ORDER — METHYLPREDNISOLONE 4 MG PO TBPK
ORAL_TABLET | ORAL | 0 refills | Status: AC
  Filled 2023-07-17: qty 21, 6d supply, fill #0

## 2023-07-18 ENCOUNTER — Other Ambulatory Visit (HOSPITAL_COMMUNITY): Payer: Self-pay

## 2023-07-25 DIAGNOSIS — M25561 Pain in right knee: Secondary | ICD-10-CM | POA: Diagnosis not present

## 2023-08-08 NOTE — Care Plan (Signed)
Ortho Bundle Case Management Note  Patient Details  Name: Joseph Maldonado. MRN: [id number] Date of Birth: [birth year]  Spoke with patient prior to surgery. He will discharge to home with family to assist. No DME needed. OPPT set up with [facility]. Discharge instructions mailed. Patient and MD in agreement with plan. Choice offered                     DME Arranged:    DME Agency:     HH Arranged:    HH Agency:     Additional Comments: Please contact me with any questions of if this plan should need to change.  Shauna Hugh,  RN,BSN,MHA,CCM  [facility]  [phone] 08/08/2023, 3:59 PM

## 2023-08-09 ENCOUNTER — Other Ambulatory Visit (HOSPITAL_COMMUNITY): Payer: Self-pay

## 2023-08-09 NOTE — Patient Instructions (Signed)
SURGICAL WAITING ROOM VISITATION  Patients having surgery or a procedure may have no more than 2 support people in the waiting area - these visitors may rotate.    Children under the age of 94 must have an adult with them who is not the patient.  Due to an increase in RSV and influenza rates and associated hospitalizations, children ages 45 and under may not visit patients in [facility] hospitals.  If the patient needs to stay at the hospital during part of their recovery, the visitor guidelines for inpatient rooms apply. Pre-op nurse will coordinate an appropriate time for 1 support person to accompany patient in pre-op.  This support person may not rotate.    Please refer to the [facility] website for the visitor guidelines for Inpatients (after your surgery is over and you are in a regular room).       Your procedure is scheduled on:  08/24/23    Report to [facility] Main Entrance    Report to admitting at  0515 AM   Call this number if you have problems the morning of surgery [phone]   Do not eat food  or drink liquids :After Midnight.               If you have questions, please contact your surgeon's office.      Oral Hygiene is also important to reduce your risk of infection.                                    Remember - BRUSH YOUR TEETH THE MORNING OF SURGERY WITH YOUR REGULAR TOOTHPASTE  DENTURES WILL BE REMOVED PRIOR TO SURGERY PLEASE DO NOT APPLY "Poly grip" OR ADHESIVES!!!   Do NOT smoke after Midnight   Stop all vitamins and herbal supplements 7 days before surgery.   Take these medicines the morning of surgery with A SIP OF WATER:  wellbutrin, protonix Sodium bicarb   DO NOT TAKE ANY ORAL DIABETIC MEDICATIONS DAY OF YOUR SURGERY  Bring CPAP mask and tubing day of surgery.                              You may not have any metal on your body including hair pins, jewelry, and body piercing             Do not wear make-up, lotions, powders,  perfumes/cologne, or deodorant  Do not wear nail polish including gel and S&S, artificial/acrylic nails, or any other type of covering on natural nails including finger and toenails. If you have artificial nails, gel coating, etc. that needs to be removed by a nail salon please have this removed prior to surgery or surgery may need to be canceled/ delayed if the surgeon/ anesthesia feels like they are unable to be safely monitored.   Do not shave  48 hours prior to surgery.               Men may shave face and neck.   Do not bring valuables to the hospital. [facility] IS NOT             RESPONSIBLE   FOR VALUABLES.   Contacts, glasses, dentures or bridgework may not be worn into surgery.   Bring small overnight bag day of surgery.   DO NOT BRING YOUR HOME MEDICATIONS TO THE HOSPITAL. PHARMACY  WILL DISPENSE MEDICATIONS LISTED ON YOUR MEDICATION LIST TO YOU DURING YOUR ADMISSION IN THE HOSPITAL!    Patients discharged on the day of surgery will not be allowed to drive home.  Someone NEEDS to stay with you for the first 24 hours after anesthesia.   Special Instructions: Bring a copy of your healthcare power of attorney and living will documents the day of surgery if you haven't scanned them before.              Please read over the following fact sheets you were given: IF YOU HAVE QUESTIONS ABOUT YOUR PRE-OP INSTRUCTIONS PLEASE CALL [phone]   If you received a COVID test during your pre-op visit  it is requested that you wear a mask when out in public, stay away from anyone that may not be feeling well and notify your surgeon if you develop symptoms. If you test positive for Covid or have been in contact with anyone that has tested positive in the last 10 days please notify you surgeon.      Pre-operative 5 CHG Bath Instructions   You can play a key role in reducing the risk of infection after surgery. Your skin needs to be as free of germs as possible. You can reduce the number of  germs on your skin by washing with CHG (chlorhexidine gluconate) soap before surgery. CHG is an antiseptic soap that kills germs and continues to kill germs even after washing.   DO NOT use if you have an allergy to chlorhexidine/CHG or antibacterial soaps. If your skin becomes reddened or irritated, stop using the CHG and notify one of our RNs at [phone].   Please shower with the CHG soap starting 4 days before surgery using the following schedule:     Please keep in mind the following:  DO NOT shave, including legs and underarms, starting the day of your first shower.   You may shave your face at any point before/day of surgery.  Place clean sheets on your bed the day you start using CHG soap. Use a clean washcloth (not used since being washed) for each shower. DO NOT sleep with pets once you start using the CHG.   CHG Shower Instructions:  If you choose to wash your hair and private area, wash first with your normal shampoo/soap.  After you use shampoo/soap, rinse your hair and body thoroughly to remove shampoo/soap residue.  Turn the water OFF and apply about 3 tablespoons (45 ml) of CHG soap to a CLEAN washcloth.  Apply CHG soap ONLY FROM YOUR NECK DOWN TO YOUR TOES (washing for 3-5 minutes)  DO NOT use CHG soap on face, private areas, open wounds, or sores.  Pay special attention to the area where your surgery is being performed.  If you are having back surgery, having someone wash your back for you may be helpful. Wait 2 minutes after CHG soap is applied, then you may rinse off the CHG soap.  Pat dry with a clean towel  Put on clean clothes/pajamas   If you choose to wear lotion, please use ONLY the CHG-compatible lotions on the back of this paper.     Additional instructions for the day of surgery: DO NOT APPLY any lotions, deodorants, cologne, or perfumes.   Put on clean/comfortable clothes.  Brush your teeth.  Ask your nurse before applying any prescription medications  to the skin.      CHG Compatible Lotions   Aveeno Moisturizing lotion  Cetaphil Moisturizing Cream  Cetaphil Moisturizing Lotion  Clairol Herbal Essence Moisturizing Lotion, Dry Skin  Clairol Herbal Essence Moisturizing Lotion, Extra Dry Skin  Clairol Herbal Essence Moisturizing Lotion, Normal Skin  Curel Age Defying Therapeutic Moisturizing Lotion with Alpha Hydroxy  Curel Extreme Care Body Lotion  Curel Soothing Hands Moisturizing Hand Lotion  Curel Therapeutic Moisturizing Cream, Fragrance-Free  Curel Therapeutic Moisturizing Lotion, Fragrance-Free  Curel Therapeutic Moisturizing Lotion, Original Formula  Eucerin Daily Replenishing Lotion  Eucerin Dry Skin Therapy Plus Alpha Hydroxy Crme  Eucerin Dry Skin Therapy Plus Alpha Hydroxy Lotion  Eucerin Original Crme  Eucerin Original Lotion  Eucerin Plus Crme Eucerin Plus Lotion  Eucerin TriLipid Replenishing Lotion  Keri Anti-Bacterial Hand Lotion  Keri Deep Conditioning Original Lotion Dry Skin Formula Softly Scented  Keri Deep Conditioning Original Lotion, Fragrance Free Sensitive Skin Formula  Keri Lotion Fast Absorbing Fragrance Free Sensitive Skin Formula  Keri Lotion Fast Absorbing Softly Scented Dry Skin Formula  Keri Original Lotion  Keri Skin Renewal Lotion Keri Silky Smooth Lotion  Keri Silky Smooth Sensitive Skin Lotion  Nivea Body Creamy Conditioning Oil  Nivea Body Extra Enriched Lotion  Nivea Body Original Lotion  Nivea Body Sheer Moisturizing Lotion Nivea Crme  Nivea Skin Firming Lotion  NutraDerm 30 Skin Lotion  NutraDerm Skin Lotion  NutraDerm Therapeutic Skin Cream  NutraDerm Therapeutic Skin Lotion  ProShield Protective Hand Cream  Provon moisturizing lotion   [facility]- Preparing for Total Shoulder Arthroplasty    Before surgery, you can play an important role. Because skin is not sterile, your skin needs to be as free of germs as possible. You can reduce the number of germs on your skin by  using the following products. Benzoyl Peroxide Gel Reduces the number of germs present on the skin Applied twice a day to shoulder area starting two days before surgery    ==================================================================  Please follow these instructions carefully:  BENZOYL PEROXIDE 5% GEL  Please do not use if you have an allergy to benzoyl peroxide.   If your skin becomes reddened/irritated stop using the benzoyl peroxide.  Starting two days before surgery, apply as follows: Apply benzoyl peroxide in the morning and at night. Apply after taking a shower. If you are not taking a shower clean entire shoulder front, back, and side along with the armpit with a clean wet washcloth.  Place a quarter-sized dollop on your shoulder and rub in thoroughly, making sure to cover the front, back, and side of your shoulder, along with the armpit.   2 days before ____ AM   ____ PM              1 day before ____ AM   ____ PM                         Do this twice a day for two days.  (Last application is the night before surgery, AFTER using the CHG soap as described below).  Do NOT apply benzoyl peroxide gel on the day of surgery.

## 2023-08-09 NOTE — Progress Notes (Signed)
Anesthesia Review:  PCP: Cardiologist : Patwardhan- LOV 06/01/23  Chest x-ray : EKG : 06/01/23  Echo : 06/06/23  CT cors- 2019  Stress test: Cardiac Cath :  Activity level:  Sleep Study/ CPAP : Fasting Blood Sugar :      / Checks Blood Sugar -- times a day:   Blood Thinner/ Instructions /Last Dose: ASA / Instructions/ Last Dose :

## 2023-08-11 ENCOUNTER — Encounter (HOSPITAL_COMMUNITY)
Admission: RE | Admit: 2023-08-11 | Discharge: 2023-08-11 | Disposition: A | Discharge status: Home / Self Care | Payer: Medicare Other | Source: Ambulatory Visit | Attending: Orthopaedic Surgery | Admitting: Orthopaedic Surgery

## 2023-08-11 ENCOUNTER — Encounter (HOSPITAL_COMMUNITY): Payer: Self-pay

## 2023-08-11 ENCOUNTER — Other Ambulatory Visit: Payer: Self-pay

## 2023-08-11 VITALS — BP 130/84 | HR 56 | Temp 98.4°F | Resp 16 | Ht 70.0 in | Wt 211.0 lb

## 2023-08-11 DIAGNOSIS — Z01812 Encounter for preprocedural laboratory examination: Secondary | ICD-10-CM | POA: Diagnosis not present

## 2023-08-11 DIAGNOSIS — Z01818 Encounter for other preprocedural examination: Secondary | ICD-10-CM

## 2023-08-11 DIAGNOSIS — D23112 Other benign neoplasm of skin of right lower eyelid, including canthus: Secondary | ICD-10-CM | POA: Diagnosis not present

## 2023-08-11 HISTORY — DX: Aneurysm of the ascending aorta, without rupture: I71.21

## 2023-08-11 HISTORY — DX: Gastro-esophageal reflux disease without esophagitis: K21.9

## 2023-08-11 LAB — CBC
HCT: 45.6 % (ref 39.0–52.0)
Hemoglobin: 14.7 g/dL (ref 13.0–17.0)
MCH: 30.8 pg (ref 26.0–34.0)
MCHC: 32.2 g/dL (ref 30.0–36.0)
MCV: 95.6 fL (ref 80.0–100.0)
Platelets: 180 10*3/uL (ref 150–400)
RBC: 4.77 MIL/uL (ref 4.22–5.81)
RDW: 14 % (ref 11.5–15.5)
WBC: 6.3 10*3/uL (ref 4.0–10.5)
nRBC: 0 % (ref 0.0–0.2)

## 2023-08-11 LAB — BASIC METABOLIC PANEL
Anion gap: 8 (ref 5–15)
BUN: 31 mg/dL — ABNORMAL HIGH (ref 8–23)
CO2: 24 mmol/L (ref 22–32)
Calcium: 10.2 mg/dL (ref 8.9–10.3)
Chloride: 108 mmol/L (ref 98–111)
Creatinine, Ser: 1.44 mg/dL — ABNORMAL HIGH (ref 0.61–1.24)
GFR, Estimated: 50 mL/min — ABNORMAL LOW (ref >60)
Glucose, Bld: 88 mg/dL (ref 70–99)
Potassium: 4.8 mmol/L (ref 3.5–5.1)
Sodium: 140 mmol/L (ref 135–145)

## 2023-08-11 LAB — SURGICAL PCR SCREEN
MRSA, PCR: NEGATIVE
Staphylococcus aureus: POSITIVE — AB

## 2023-08-12 ENCOUNTER — Encounter (HOSPITAL_COMMUNITY): Payer: Self-pay | Admitting: Physician Assistant

## 2023-08-15 NOTE — Progress Notes (Signed)
Anesthesia Chart Review   Case: [id number] Date/Time: 08/24/23 0715   Procedure: REVERSE SHOULDER ARTHROPLASTY (Left: Shoulder)   Anesthesia type: General   Pre-op diagnosis: djd left shoulder   Location: [facility] ROOM 06 / [facility]   Surgeons: Bjorn Pippin, MD       DISCUSSION:77 y.o. former smoker with h/o HTN, sleep apnea w/CPAP, nonobstructive CAD, AAA with bovine arch, left shoulder djd scheduled for above procedure 08/24/23 with Dr. Ramond Marrow.   Pt last seen by cardiology 06/01/2023. Stable at this visit with 1 year follow up recommended.   Echo 06/03/2023 with EF 60-65%, stable AAA measuring 42 mm.   Clearance from cardio received which states, "Joseph Maldonado. is at low risk, from a cardiac standpoint, for his upcoming procedure: Rt reverse shoulder arthroplasty.  It is ok to proceed without further cardiac testing.   If applicable can hold Aspirin for 5  day(s) prior to procedure and re-start 1 day post procedure"  VS: BP 130/84   Pulse (!) 56   Temp 36.9 C (Oral)   Resp 16   Ht 5\' 10"  (1.778 m)   Wt 95.7 kg   SpO2 100%   BMI 30.28 kg/m   PROVIDERS: Charlane Ferretti, DO is PCP   Truett Mainland, MD is Cardiologist  LABS: Labs reviewed: Acceptable for surgery. (all labs ordered are listed, but only abnormal results are displayed)  Labs Reviewed  SURGICAL PCR SCREEN - Abnormal; Notable for the following components:      Result Value   Staphylococcus aureus POSITIVE (*)    All other components within normal limits  BASIC METABOLIC PANEL - Abnormal; Notable for the following components:   BUN 31 (*)    Creatinine, Ser 1.44 (*)    GFR, Estimated 50 (*)    All other components within normal limits  CBC     IMAGES:   EKG:   CV: Echo 06/03/2023  1. Left ventricular ejection fraction, by estimation, is 60 to 65%. The  left ventricle has normal function. The left ventricle has no regional  wall motion abnormalities. Left ventricular diastolic parameters are   consistent with Grade I diastolic  dysfunction (impaired relaxation).   2. Right ventricular systolic function is normal. The right ventricular  size is normal. There is normal pulmonary artery systolic pressure.   3. The mitral valve is normal in structure. No evidence of mitral valve  regurgitation. No evidence of mitral stenosis.   4. The aortic valve is tricuspid. There is mild calcification of the  aortic valve. Aortic valve regurgitation is mild.   5. Aneurysm of the ascending aorta, measuring 42 mm.   Comparison(s): A prior study was performed on 05/21/2022. No significant  change noted.  Past Medical History:  Diagnosis Date   Arthritis    Ascending aortic aneurysm (HCC)    CAD (coronary artery disease)    Chronic kidney disease    Depression    GERD (gastroesophageal reflux disease)    Hand pain    currently being evaluated by rheumatology for dx    Hematuria    Hypertension    Rapid heart beat    rpeorts  " 2 months ago i had a rapid heart beat that lasted 45 min to an hour, ive experience this twice in the last t10 years but only seconds long" " i was evaluated by cardiology Dr Marilynne Halsted who did a 30 day monitor and ECHO and said i have occasional [PATS] ? , denies chest pain  nor LOC  during these episodes; HR today is WDL    Sleep apnea    CPAP     Past Surgical History:  Procedure Laterality Date   CATARACT EXTRACTION, BILATERAL     HIP SURGERY Left 2012   JOINT REPLACEMENT     REPLACEMENT TOTAL KNEE Right    2001,2006,2009,2013   REPLACEMENT TOTAL KNEE Left 2011   REVERSE SHOULDER ARTHROPLASTY Right 01/20/2023   Procedure: REVERSE SHOULDER ARTHROPLASTY;  Surgeon: Bjorn Pippin, MD;  Location: [facility];  Service: Orthopedics;  Laterality: Right;   TOTAL HIP ARTHROPLASTY Right 08/14/2018   Procedure: TOTAL HIP ARTHROPLASTY ANTERIOR APPROACH;  Surgeon: Gean Birchwood, MD;  [facility];  Service: Orthopedics;  Laterality: Right;     MEDICATIONS:  acetaminophen (TYLENOL) 500 MG tablet   atenolol (TENORMIN) 25 MG tablet   atorvastatin (LIPITOR) 20 MG tablet   buPROPion ER (WELLBUTRIN SR) 100 MG 12 hr tablet   buPROPion ER (WELLBUTRIN SR) 100 MG 12 hr tablet   cholecalciferol (VITAMIN D3) 25 MCG (1000 UNIT) tablet   losartan (COZAAR) 100 MG tablet   methylPREDNISolone (MEDROL) 4 MG TBPK tablet   Multiple Vitamin (MULTIVITAMIN WITH MINERALS) TABS tablet   pantoprazole (PROTONIX) 20 MG tablet   sodium bicarbonate 650 MG tablet   tizanidine (ZANAFLEX) 2 MG capsule   No current facility-administered medications for this encounter.      Jodell Cipro Ward, PA-C [facility]Surgical [facility] [phone]

## 2023-08-15 NOTE — Anesthesia Preprocedure Evaluation (Signed)
Anesthesia Evaluation    Airway        Dental   Pulmonary former smoker          Cardiovascular hypertension,      Neuro/Psych    GI/Hepatic   Endo/Other    Renal/GU      Musculoskeletal   Abdominal   Peds  Hematology   Anesthesia Other Findings   Reproductive/Obstetrics                             Anesthesia Physical Anesthesia Plan  ASA:   Anesthesia Plan:    Post-op Pain Management:    Induction:   PONV Risk Score and Plan:   Airway Management Planned:   Additional Equipment:   Intra-op Plan:   Post-operative Plan:   Informed Consent:   Plan Discussed with:   Anesthesia Plan Comments: (See PAT note 08/11/23)       Anesthesia Quick Evaluation

## 2023-08-23 NOTE — H&P (Signed)
PREOPERATIVE H&P  Chief Complaint: djd left shoulder  HPI: Joseph Maldonado. is a 77 y.o. male who is scheduled for, Procedure(s): REVERSE SHOULDER ARTHROPLASTY.   Patient has a past medical history significant for left cuff arthropathy   Symptoms are rated as moderate to severe, and have been worsening.  This is significantly impairing activities of daily living.    Please see clinic note for further details on this patient's care.    He has elected for surgical management.   Past Medical History:  Diagnosis Date   Arthritis    Ascending aortic aneurysm (HCC)    CAD (coronary artery disease)    Chronic kidney disease    Depression    GERD (gastroesophageal reflux disease)    Hand pain    currently being evaluated by rheumatology for dx    Hematuria    Hypertension    Rapid heart beat    rpeorts  " 2 months ago i had a rapid heart beat that lasted 45 min to an hour, ive experience this twice in the last t10 years but only seconds long" " i was evaluated by cardiology Dr Marilynne Halsted who did a 30 day monitor and ECHO and said i have occasional [PATS] ? , denies chest pain nor LOC  during these episodes; HR today is WDL    Sleep apnea    CPAP    Past Surgical History:  Procedure Laterality Date   CATARACT EXTRACTION, BILATERAL     HIP SURGERY Left 2012   JOINT REPLACEMENT     REPLACEMENT TOTAL KNEE Right    2001,2006,2009,2013   REPLACEMENT TOTAL KNEE Left 2011   REVERSE SHOULDER ARTHROPLASTY Right 01/20/2023   Procedure: REVERSE SHOULDER ARTHROPLASTY;  Surgeon: Bjorn Pippin, MD;  Location: [facility];  Service: Orthopedics;  Laterality: Right;   TOTAL HIP ARTHROPLASTY Right 08/14/2018   Procedure: TOTAL HIP ARTHROPLASTY ANTERIOR APPROACH;  Surgeon: Gean Birchwood, MD;  [facility];  Service: Orthopedics;  Laterality: Right;   Social History   Socioeconomic History   Marital status: Married    Spouse name: Not on file   Number of children: 2    Years of education: Not on file   Highest education level: Bachelor's degree (e.g., BA, AB, BS)  Occupational History   Occupation: Retired  Tobacco Use   Smoking status: Former    Current packs/day: 0.00    Average packs/day: 0.5 packs/day for 3.0 years (1.5 ttl pk-yrs)    Types: Cigarettes    Start date: 1968    Quit date: 1971    Years since quitting: 53.9   Smokeless tobacco: Never  Vaping Use   Vaping status: Never Used  Substance and Sexual Activity   Alcohol use: Yes    Alcohol/week: 3.0 standard drinks of alcohol    Types: 3 Cans of beer per week    Comment: rarely   Drug use: No    Comment: former user of marijuana, quit in 1971   Sexual activity: Yes  Other Topics Concern   Not on file  Social History Narrative   Lives with his wife.   Education: College   Exercise: No   Drinks about 2 large coffees per day   Right handed      Spent many years in [place].  Moved to [place] to be near their children.  Daughter lives in [place].  Their son has lived in the [place] area with plans to move to [facility] in  summer 2019.   Social Determinants of Health   Financial Resource Strain: Low Risk  (08/08/2017)   Overall Financial Resource Strain (CARDIA)    Difficulty of Paying Living Expenses: Not hard at all  Food Insecurity: No Food Insecurity (08/08/2017)   Hunger Vital Sign    Worried About Running Out of Food in the Last Year: Never true    Ran Out of Food in the Last Year: Never true  Transportation Needs: No Transportation Needs (08/08/2017)   PRAPARE - Administrator, Civil Service (Medical): No    Lack of Transportation (Non-Medical): No  Physical Activity: Inactive (08/08/2017)   Exercise Vital Sign    Days of Exercise per Week: 0 days    Minutes of Exercise per Session: 0 min  Stress: No Stress Concern Present (08/08/2017)   [employer] of Occupational Health - Occupational Stress Questionnaire    Feeling of Stress : Not at all  Social  Connections: Socially Integrated (08/08/2017)   Social Connection and Isolation Panel [NHANES]    Frequency of Communication with Friends and Family: More than three times a week    Frequency of Social Gatherings with Friends and Family: Once a week    Attends Religious Services: More than 4 times per year    Active Member of [employer] or Organizations: Yes    Attends Engineer, structural: More than 4 times per year    Marital Status: Married   Family History  Problem Relation Age of Onset   Cancer Mother        throat   Hypertension Mother    Heart disease Mother    Emphysema Mother    Hypertension Father    Cancer Father        Brain   Hypertension Sister    Hematuria Sister    Hematuria Brother    Alzheimer's disease Neg Hx    Dementia Neg Hx    Allergies  Allergen Reactions   Nsaids     Affects creatinine   Dilaudid [Hydromorphone Hcl] Other (See Comments)    Suicidal   Sulfa Antibiotics     Other reaction(s): GI Intolerance   Prior to Admission medications   Medication Sig Start Date End Date Taking? Authorizing Provider  acetaminophen (TYLENOL) 500 MG tablet Take 1,000 mg by mouth in the morning and at bedtime.   Yes [provider]  atenolol (TENORMIN) 25 MG tablet Take 1 tablet (25 mg total) by mouth daily Patient taking differently: Take 25 mg by mouth every evening. 03/24/23  Yes   atorvastatin (LIPITOR) 20 MG tablet Take 1 tablet (20 mg total) by mouth daily. Patient taking differently: Take 20 mg by mouth every evening. 05/31/23  Yes   buPROPion ER (WELLBUTRIN SR) 100 MG 12 hr tablet Take 1 tablet by mouth once daily Patient taking differently: Take 100 mg by mouth 2 (two) times daily. 03/15/23  Yes   cholecalciferol (VITAMIN D3) 25 MCG (1000 UNIT) tablet Take 1,000 Units by mouth in the morning.   Yes [provider]  losartan (COZAAR) 100 MG tablet Take 1 tablet (100 mg) by mouth daily Patient taking differently: Take 100 mg by mouth every  evening. 03/24/23  Yes   Multiple Vitamin (MULTIVITAMIN WITH MINERALS) TABS tablet Take 1 tablet by mouth every evening.   Yes [provider]  pantoprazole (PROTONIX) 20 MG tablet Take 1 tablet (20 mg total) by mouth daily. 11/23/22  Yes   sodium bicarbonate 650 MG tablet  Take 1 tablet (650 mg total) by mouth 2 (two) times daily. 09/29/22  Yes   tizanidine (ZANAFLEX) 2 MG capsule Take 2 mg by mouth at bedtime as needed (stiffness/sleep.).   Yes [provider]  buPROPion ER (WELLBUTRIN SR) 100 MG 12 hr tablet Take 1 tablet (100 mg total) by mouth 2 (two) times daily for 30 days. 03/24/23     methylPREDNISolone (MEDROL) 4 MG TBPK tablet Take as prescribed on dose pack over 6 days 07/17/23       ROS: All other systems have been reviewed and were otherwise negative with the exception of those mentioned in the HPI and as above.  Physical Exam: General: Alert, no acute distress Cardiovascular: No pedal edema Respiratory: No cyanosis, no use of accessory musculature GI: No organomegaly, abdomen is soft and non-tender Skin: No lesions in the area of chief complaint Neurologic: Sensation intact distally Psychiatric: Patient is competent for consent with normal mood and affect Lymphatic: No axillary or cervical lymphadenopathy  MUSCULOSKELETAL:  The range of motion of the right shoulder active forward elevation is to 130 degrees, passive 150 degrees. The left shoulder active forward elevation to 150 degrees and passive 160 degrees. Cuff strength is weak throughout the left side with testing.   Imaging: X-rays of the left shoulder were reviewed again and demonstrate proximal migration and significant erosion of the acromion. [place] 4B rotator cuff arthropathy.   Assessment: djd left shoulder  Plan: Plan for Procedure(s): REVERSE SHOULDER ARTHROPLASTY LEFT   The risks benefits and alternatives were discussed with the patient including but not limited to the risks of nonoperative  treatment, versus surgical intervention including infection, bleeding, nerve injury,  blood clots, cardiopulmonary complications, morbidity, mortality, among others, and they were willing to proceed.   The patient acknowledged the explanation, agreed to proceed with the plan and consent was signed.   Operative Plan: Left reverse total shoulder arthroplasty  Discharge Medications: Oxycodone, tylenol, zofran DVT Prophylaxis: ASA 81mg  BID x 6 weeks Physical Therapy: outpatient PT Special Discharge needs: Ice man, ultrasling    Corinna Capra, PA-C  08/23/2023 7:30 AM

## 2023-08-24 ENCOUNTER — Ambulatory Visit (HOSPITAL_COMMUNITY): Admission: RE | Admit: 2023-08-24 | Payer: Medicare Other | Source: Home / Self Care | Admitting: Orthopaedic Surgery

## 2023-08-24 ENCOUNTER — Encounter (HOSPITAL_COMMUNITY): Admission: RE | Payer: Self-pay | Source: Home / Self Care

## 2023-08-24 SURGERY — REVERSE SHOULDER ARTHROPLASTY
Anesthesia: General | Site: Shoulder | Laterality: Left

## 2023-08-27 ENCOUNTER — Other Ambulatory Visit (HOSPITAL_COMMUNITY): Payer: Self-pay

## 2023-09-01 ENCOUNTER — Other Ambulatory Visit: Payer: Self-pay

## 2023-09-07 DIAGNOSIS — D692 Other nonthrombocytopenic purpura: Secondary | ICD-10-CM | POA: Diagnosis not present

## 2023-09-07 DIAGNOSIS — R454 Irritability and anger: Secondary | ICD-10-CM | POA: Diagnosis not present

## 2023-09-07 DIAGNOSIS — E785 Hyperlipidemia, unspecified: Secondary | ICD-10-CM | POA: Diagnosis not present

## 2023-09-07 DIAGNOSIS — N1831 Chronic kidney disease, stage 3a: Secondary | ICD-10-CM | POA: Diagnosis not present

## 2023-09-07 DIAGNOSIS — I25118 Atherosclerotic heart disease of native coronary artery with other forms of angina pectoris: Secondary | ICD-10-CM | POA: Diagnosis not present

## 2023-09-07 DIAGNOSIS — E875 Hyperkalemia: Secondary | ICD-10-CM | POA: Diagnosis not present

## 2023-09-07 DIAGNOSIS — M85821 Other specified disorders of bone density and structure, right upper arm: Secondary | ICD-10-CM | POA: Diagnosis not present

## 2023-09-07 DIAGNOSIS — I7121 Aneurysm of the ascending aorta, without rupture: Secondary | ICD-10-CM | POA: Diagnosis not present

## 2023-09-07 DIAGNOSIS — R413 Other amnesia: Secondary | ICD-10-CM | POA: Diagnosis not present

## 2023-09-07 DIAGNOSIS — M1990 Unspecified osteoarthritis, unspecified site: Secondary | ICD-10-CM | POA: Diagnosis not present

## 2023-09-07 DIAGNOSIS — I10 Essential (primary) hypertension: Secondary | ICD-10-CM | POA: Diagnosis not present

## 2023-09-07 DIAGNOSIS — I700 Atherosclerosis of aorta: Secondary | ICD-10-CM | POA: Diagnosis not present

## 2023-09-08 ENCOUNTER — Other Ambulatory Visit (HOSPITAL_COMMUNITY): Payer: Self-pay

## 2023-09-08 MED ORDER — SERTRALINE HCL 50 MG PO TABS
ORAL_TABLET | ORAL | 0 refills | Status: DC
  Filled 2023-09-08: qty 30, 37d supply, fill #0

## 2023-09-15 DIAGNOSIS — D2261 Melanocytic nevi of right upper limb, including shoulder: Secondary | ICD-10-CM | POA: Diagnosis not present

## 2023-09-15 DIAGNOSIS — D225 Melanocytic nevi of trunk: Secondary | ICD-10-CM | POA: Diagnosis not present

## 2023-09-15 DIAGNOSIS — L853 Xerosis cutis: Secondary | ICD-10-CM | POA: Diagnosis not present

## 2023-09-15 DIAGNOSIS — L821 Other seborrheic keratosis: Secondary | ICD-10-CM | POA: Diagnosis not present

## 2023-09-15 DIAGNOSIS — Z85828 Personal history of other malignant neoplasm of skin: Secondary | ICD-10-CM | POA: Diagnosis not present

## 2023-09-15 DIAGNOSIS — L814 Other melanin hyperpigmentation: Secondary | ICD-10-CM | POA: Diagnosis not present

## 2023-09-15 DIAGNOSIS — D2262 Melanocytic nevi of left upper limb, including shoulder: Secondary | ICD-10-CM | POA: Diagnosis not present

## 2023-09-15 DIAGNOSIS — L570 Actinic keratosis: Secondary | ICD-10-CM | POA: Diagnosis not present

## 2023-09-19 ENCOUNTER — Other Ambulatory Visit (HOSPITAL_COMMUNITY): Payer: Self-pay

## 2023-09-19 MED ORDER — SERTRALINE HCL 50 MG PO TABS
ORAL_TABLET | ORAL | 0 refills | Status: AC
Start: 2023-09-19 — End: 2023-10-26
  Filled 2023-09-19: qty 30, 37d supply, fill #0
  Filled 2023-10-04: qty 30, 30d supply, fill #0

## 2023-09-19 MED ORDER — SODIUM BICARBONATE 650 MG PO TABS
650.0000 mg | ORAL_TABLET | Freq: Two times a day (BID) | ORAL | 3 refills | Status: DC
  Filled 2023-09-19: qty 120, 60d supply, fill #0
  Filled 2023-11-26: qty 120, 60d supply, fill #1
  Filled 2024-01-27: qty 120, 60d supply, fill #2
  Filled 2024-03-31: qty 120, 60d supply, fill #3

## 2023-09-26 ENCOUNTER — Other Ambulatory Visit (HOSPITAL_COMMUNITY): Payer: Self-pay

## 2023-09-27 ENCOUNTER — Other Ambulatory Visit: Payer: Self-pay

## 2023-09-30 DIAGNOSIS — Z961 Presence of intraocular lens: Secondary | ICD-10-CM | POA: Diagnosis not present

## 2023-10-04 ENCOUNTER — Other Ambulatory Visit (HOSPITAL_COMMUNITY): Payer: Self-pay

## 2023-10-04 ENCOUNTER — Other Ambulatory Visit: Payer: Self-pay

## 2023-10-06 MED ORDER — LACTATED RINGERS IV SOLN: INTRAVENOUS | Status: DC

## 2023-10-06 MED ORDER — CHLORHEXIDINE GLUCONATE 0.12 % MT SOLN: 15.0000 mL | Freq: Once | OROMUCOSAL | Status: DC

## 2023-10-06 MED ORDER — ORAL CARE MOUTH RINSE: 15.0000 mL | Freq: Once | OROMUCOSAL | Status: DC

## 2023-10-06 NOTE — Patient Instructions (Addendum)
 SURGICAL WAITING ROOM VISITATION Patients having surgery or a procedure may have no more than 2 support people in the waiting area - these visitors may rotate in the visitor waiting room.   Due to an increase in RSV and influenza rates and associated hospitalizations, children ages 37 and under may not visit patients in [facility] hospitals. If the patient needs to stay at the hospital during part of their recovery, the visitor guidelines for inpatient rooms apply.  PRE-OP VISITATION  Pre-op nurse will coordinate an appropriate time for 1 support person to accompany the patient in pre-op.  This support person may not rotate.  This visitor will be contacted when the time is appropriate for the visitor to come back in the pre-op area.  Please refer to the [facility] website for the visitor guidelines for Inpatients (after your surgery is over and you are in a regular room).  You are not required to quarantine at this time prior to your surgery. However, you must do this: Hand Hygiene often Do NOT share personal items Notify your provider if you are in close contact with someone who has COVID or you develop fever 100.4 or greater, new onset of sneezing, cough, sore throat, shortness of breath or body aches.  If you test positive for Covid or have been in contact with anyone that has tested positive in the last 10 days please notify you surgeon.    Your procedure is scheduled on:  October 19, 2023   Report to [facility] Main Entrance: Joseph Maldonado entrance where the [employer] is available.   Report to admitting at: 06:00    AM  Call this number if you have any questions or problems the morning of surgery [phone]  Do not eat  or drink anything after Midnight the night prior to your surgery/procedure.   FOLLOW ANY ADDITIONAL PRE OP INSTRUCTIONS YOU RECEIVED FROM YOUR SURGEON'S OFFICE!!!   Oral Hygiene is also important to reduce your risk of infection.        Remember -  BRUSH YOUR TEETH THE MORNING OF SURGERY WITH YOUR REGULAR TOOTHPASTE  Do NOT smoke after Midnight the night before surgery.  STOP TAKING all Vitamins, Herbs and supplements 1 week before your surgery.   Take ONLY these medicines the morning of surgery with A SIP OF WATER : pantoprazole , sertraline , bupropion , and Tylenol  if needed.    If You have been diagnosed with Sleep Apnea - Bring CPAP mask and tubing day of surgery. We will provide you with a CPAP machine on the day of your surgery.                   You may not have any metal on your body including  jewelry, and body piercing  Do not wear lotions, powders,  cologne, or deodorant  Men may shave face and neck.  Contacts, Hearing Aids, dentures or bridgework may not be worn into surgery. DENTURES WILL BE REMOVED PRIOR TO SURGERY PLEASE DO NOT APPLY Poly grip OR ADHESIVES!!!  Patients discharged on the day of surgery will not be allowed to drive home.  Someone NEEDS to stay with you for the first 24 hours after anesthesia.  Do not bring your home medications to the hospital. The Pharmacy will dispense medications listed on your medication list to you during your admission in the Hospital.  Please read over the following fact sheets you were given: IF YOU HAVE QUESTIONS ABOUT YOUR PRE-OP INSTRUCTIONS, PLEASE CALL [phone]  Shawnee Aloe, RN     Pre-operative 5 CHG Bath Instructions   You can play a key role in reducing the risk of infection after surgery. Your skin needs to be as free of germs as possible. You can reduce the number of germs on your skin by washing with CHG (chlorhexidine  gluconate) soap before surgery. CHG is an antiseptic soap that kills germs and continues to kill germs even after washing.   DO NOT use if you have an allergy  to chlorhexidine /CHG or antibacterial soaps. If your skin becomes reddened or irritated, stop using the CHG and notify one of our RNs at [phone]  Please shower with the CHG soap  starting 4 days before surgery using the following schedule: START SHOWERS ON   SATURDAY  October 15, 2023                                                                                                                                                                              Please keep in mind the following:  DO NOT shave, including legs and underarms, starting the day of your first shower.   You may shave your face at any point before/day of surgery.   Place clean sheets on your bed the day you start using CHG soap. Use a clean washcloth (not used since being washed) for each shower. DO NOT sleep with pets once you start using the CHG.   CHG Shower Instructions:  If you choose to wash your hair and private area, wash first with your normal shampoo/soap.  After you use shampoo/soap, rinse your hair and body thoroughly to remove shampoo/soap residue.  Turn the water  OFF and apply about 3 tablespoons (45 ml) of CHG soap to a CLEAN washcloth.  Apply CHG soap ONLY FROM YOUR NECK DOWN TO YOUR TOES (washing for 3-5 minutes)  DO NOT use CHG soap on face, private areas, open wounds, or sores.  Pay special attention to the area where your surgery is being performed.  If you are having back surgery, having someone wash your back for you may be helpful.  Wait 2 minutes after CHG soap is applied, then you may rinse off the CHG soap.  Pat dry with a clean towel  Put on clean clothes/pajamas   If you choose to wear lotion, please use ONLY the CHG-compatible lotions on the back of this paper.     Additional instructions for the day of surgery: DO NOT APPLY any lotions, deodorants, cologne, or perfumes.   Put on clean/comfortable clothes.  Brush your teeth.  Ask your nurse before applying any prescription medications to the skin.      CHG Compatible Lotions   Aveeno Moisturizing  lotion  Cetaphil Moisturizing Cream  Cetaphil Moisturizing Lotion  Clairol Herbal Essence Moisturizing  Lotion, Dry Skin  Clairol Herbal Essence Moisturizing Lotion, Extra Dry Skin  Clairol Herbal Essence Moisturizing Lotion, Normal Skin  Curel Age Defying Therapeutic Moisturizing Lotion with Alpha Hydroxy  Curel Extreme Care Body Lotion  Curel Soothing Hands Moisturizing Hand Lotion  Curel Therapeutic Moisturizing Cream, Fragrance-Free  Curel Therapeutic Moisturizing Lotion, Fragrance-Free  Curel Therapeutic Moisturizing Lotion, Original Formula  Eucerin Daily Replenishing Lotion  Eucerin Dry Skin Therapy Plus Alpha Hydroxy Crme  Eucerin Dry Skin Therapy Plus Alpha Hydroxy Lotion  Eucerin Original Crme  Eucerin Original Lotion  Eucerin Plus Crme Eucerin Plus Lotion  Eucerin TriLipid Replenishing Lotion  Keri Anti-Bacterial Hand Lotion  Keri Deep Conditioning Original Lotion Dry Skin Formula Softly Scented  Keri Deep Conditioning Original Lotion, Fragrance Free Sensitive Skin Formula  Keri Lotion Fast Absorbing Fragrance Free Sensitive Skin Formula  Keri Lotion Fast Absorbing Softly Scented Dry Skin Formula  Keri Original Lotion  Keri Skin Renewal Lotion Keri Silky Smooth Lotion  Keri Silky Smooth Sensitive Skin Lotion  Nivea Body Creamy Conditioning Oil  Nivea Body Extra Enriched Lotion  Nivea Body Original Lotion  Nivea Body Sheer Moisturizing Lotion Nivea Crme  Nivea Skin Firming Lotion  NutraDerm 30 Skin Lotion  NutraDerm Skin Lotion  NutraDerm Therapeutic Skin Cream  NutraDerm Therapeutic Skin Lotion  ProShield Protective Hand Cream  Provon moisturizing lotion    Preparing for Total Shoulder Arthroplasty ================================================================= Please follow these instructions carefully, in addition to any other special Bathing information that was explained to you at the Presurgical Appointment:  BENZOYL PEROXIDE 5% GEL: Used to kill bacteria on the skin which could cause an infection at the surgery site.   Please do not use if you have an  allergy  to benzoyl peroxide. If your skin becomes reddened/irritated stop using the benzoyl peroxide and inform your Doctor.   Starting two days before surgery, apply as follows:  1. Apply benzoyl peroxide gel in the morning and at night. Apply after taking a shower. If you are not taking a shower, clean entire shoulder front, back, and side, along with the armpit with a clean wet washcloth.  2. Place a quarter-sized dollop of the gel on your SHOULDER and rub in thoroughly, making sure to cover the front, back, and side of your shoulder, along with the armpit.   2 Days prior to Surgery  [facility]  October 17, 2023 First Application _______ Morning Second Application _______ Night  Day Before Surgery    TUESDAY  October 18, 2023 First Application______ Morning  On the night before surgery, wash your entire body (except hair, face and private areas) with CHG Soap. THEN, rub in the LAST application of the Benzoyl Peroxide Gel on your shoulder.   3. On the Morning of Surgery wash your BODY AGAIN with CHG Soap (except hair, face and private areas)  4. DO NOT USE THE BENZOYL PEROXIDE GEL ON THE DAY OF YOUR SURGERY       FAILURE TO FOLLOW THESE INSTRUCTIONS MAY RESULT IN THE CANCELLATION OF YOUR SURGERY  PATIENT SIGNATURE_________________________________  NURSE SIGNATURE__________________________________  ________________________________________________________________________      Joseph Maldonado    An incentive spirometer is a tool that can help keep your lungs clear and active. This tool measures how well you are filling your lungs with each breath. Taking long deep breaths may help reverse or decrease the chance of developing breathing (pulmonary) problems (especially infection) following: A long  period of time when you are unable to move or be active. BEFORE THE PROCEDURE  If the spirometer includes an indicator to show your best effort, your nurse or respiratory therapist  will set it to a desired goal. If possible, sit up straight or lean slightly forward. Try not to slouch. Hold the incentive spirometer in an upright position. INSTRUCTIONS FOR USE  Sit on the edge of your bed if possible, or sit up as far as you can in bed or on a chair. Hold the incentive spirometer in an upright position. Breathe out normally. Place the mouthpiece in your mouth and seal your lips tightly around it. Breathe in slowly and as deeply as possible, raising the piston or the ball toward the top of the column. Hold your breath for 3-5 seconds or for as long as possible. Allow the piston or ball to fall to the bottom of the column. Remove the mouthpiece from your mouth and breathe out normally. Rest for a few seconds and repeat Steps 1 through 7 at least 10 times every 1-2 hours when you are awake. Take your time and take a few normal breaths between deep breaths. The spirometer may include an indicator to show your best effort. Use the indicator as a goal to work toward during each repetition. After each set of 10 deep breaths, practice coughing to be sure your lungs are clear. If you have an incision (the cut made at the time of surgery), support your incision when coughing by placing a pillow or rolled up towels firmly against it. Once you are able to get out of bed, walk around indoors and cough well. You may stop using the incentive spirometer when instructed by your caregiver.  RISKS AND COMPLICATIONS Take your time so you do not get dizzy or light-headed. If you are in pain, you may need to take or ask for pain medication before doing incentive spirometry. It is harder to take a deep breath if you are having pain. AFTER USE Rest and breathe slowly and easily. It can be helpful to keep track of a log of your progress. Your caregiver can provide you with a simple table to help with this. If you are using the spirometer at home, follow these instructions: SEEK MEDICAL CARE IF:   You are having difficultly using the spirometer. You have trouble using the spirometer as often as instructed. Your pain medication is not giving enough relief while using the spirometer. You develop fever of 100.5 F (38.1 C) or higher.                                                                                                    SEEK IMMEDIATE MEDICAL CARE IF:  You cough up bloody sputum that had not been present before. You develop fever of 102 F (38.9 C) or greater. You develop worsening pain at or near the incision site. MAKE SURE YOU:  Understand these instructions. Will watch your condition. Will get help right away if you are not doing well or get worse. Document Released: 01/24/2007 Document Revised: 12/06/2011  Document Reviewed: 03/27/2007 [facility] Patient Information 2014 [place], [place].

## 2023-10-06 NOTE — Progress Notes (Addendum)
 COVID Vaccine received:  []  No [x]  Yes Date of any COVID positive Test in last 90 days:  none  PCP - Massie Sewer, DO Cardiologist - Newman Lawrence, MD Nephrology-  Mateo Romney, MD  Chest x-ray - CT chest wo contrast  11-18-2020  Epic EKG -06-01-2023  Epic   Stress Test -  ECHO - 06-03-2023  Epic Cardiac Cath -  CTA coronary calcium  score of 888 on 06-20-2018  Epic  PCR screen: [x]  Ordered & Completed []   No Order but Needs PROFEND     []   N/A for this surgery  Surgery Plan:  [x]  Ambulatory   []  Outpatient in bed  []  Admit Anesthesia:    [x]  General  []  Spinal  []   Choice []   MAC  Pacemaker / ICD device [x]  No []  Yes   Spinal Cord Stimulator:[x]  No []  Yes       History of Sleep Apnea? []  No [x]  Yes   CPAP used?- []  No [x]  Yes    Does the patient monitor blood sugar?   [x]  N/A   []  No []  Yes  Patient has: [x]  NO Hx DM   []  Pre-DM   []  DM1  []   DM2 Last A1c was: normal 5.4 on 08-28-2020       Blood Thinner / Instructions: none Aspirin  Instructions:  none  ERAS Protocol Ordered: [x]  No  []  Yes Patient is to be NPO after: midnight prior  Dental hx: []  Dentures:  [x]  N/A      []  Bridge or Partial:                   []  Loose or Damaged teeth:   Comments: Patient was given the 5 CHG shower / bath instructions for Reverse Shoulder arthroplasty surgery along with 2 bottles of the CHG soap. Patient will start this on: Saturday 10-15-2023  All questions were asked and answered, Patient voiced understanding of this process.   The patient was given Benzoyl peroxide Gel as ordered. Instruction regarding application starting 2 days prior to surgery was given and patient voiced understanding.   Activity level: Patient is able to climb a flight of stairs without difficulty; [x]  No CP  [x]  No SOB.  Patient can perform ADLs without assistance.   Anesthesia review:  HTN, anxiety, OSA- CPAP, CKD3, GERD, Ascending TAA, CAD, GERD  Patient denies shortness of breath, fever, cough and chest pain  at PAT appointment.  Patient verbalized understanding and agreement to the Pre-Surgical Instructions that were given to them at this PAT appointment. Patient was also educated of the need to review these PAT instructions again prior to his surgery.I reviewed the appropriate phone numbers to call if they have any and questions or concerns.

## 2023-10-07 ENCOUNTER — Encounter (HOSPITAL_COMMUNITY)
Admission: RE | Admit: 2023-10-07 | Discharge: 2023-10-07 | Disposition: A | Discharge status: Home / Self Care | Payer: Medicare Other | Source: Ambulatory Visit | Attending: Orthopaedic Surgery

## 2023-10-07 ENCOUNTER — Encounter (HOSPITAL_COMMUNITY): Payer: Self-pay

## 2023-10-07 ENCOUNTER — Other Ambulatory Visit: Payer: Self-pay

## 2023-10-07 VITALS — BP 107/61 | HR 55 | Temp 98.2°F | Resp 16 | Ht 70.0 in | Wt 212.0 lb

## 2023-10-07 DIAGNOSIS — Z01818 Encounter for other preprocedural examination: Secondary | ICD-10-CM

## 2023-10-07 DIAGNOSIS — I2510 Atherosclerotic heart disease of native coronary artery without angina pectoris: Secondary | ICD-10-CM | POA: Insufficient documentation

## 2023-10-07 DIAGNOSIS — Z01812 Encounter for preprocedural laboratory examination: Secondary | ICD-10-CM | POA: Diagnosis not present

## 2023-10-07 DIAGNOSIS — N189 Chronic kidney disease, unspecified: Secondary | ICD-10-CM | POA: Diagnosis not present

## 2023-10-07 DIAGNOSIS — I129 Hypertensive chronic kidney disease with stage 1 through stage 4 chronic kidney disease, or unspecified chronic kidney disease: Secondary | ICD-10-CM | POA: Insufficient documentation

## 2023-10-07 DIAGNOSIS — I10 Essential (primary) hypertension: Secondary | ICD-10-CM

## 2023-10-07 HISTORY — DX: Cardiac arrhythmia, unspecified: I49.9

## 2023-10-07 LAB — CBC
HCT: 46 % (ref 39.0–52.0)
Hemoglobin: 14.4 g/dL (ref 13.0–17.0)
MCH: 30.5 pg (ref 26.0–34.0)
MCHC: 31.3 g/dL (ref 30.0–36.0)
MCV: 97.5 fL (ref 80.0–100.0)
Platelets: 173 10*3/uL (ref 150–400)
RBC: 4.72 MIL/uL (ref 4.22–5.81)
RDW: 13.8 % (ref 11.5–15.5)
WBC: 6.7 10*3/uL (ref 4.0–10.5)
nRBC: 0 % (ref 0.0–0.2)

## 2023-10-07 LAB — BASIC METABOLIC PANEL
Anion gap: 6 (ref 5–15)
BUN: 31 mg/dL — ABNORMAL HIGH (ref 8–23)
CO2: 22 mmol/L (ref 22–32)
Calcium: 9.4 mg/dL (ref 8.9–10.3)
Chloride: 110 mmol/L (ref 98–111)
Creatinine, Ser: 1.33 mg/dL — ABNORMAL HIGH (ref 0.61–1.24)
GFR, Estimated: 55 mL/min — ABNORMAL LOW (ref >60)
Glucose, Bld: 97 mg/dL (ref 70–99)
Potassium: 4.3 mmol/L (ref 3.5–5.1)
Sodium: 138 mmol/L (ref 135–145)

## 2023-10-10 ENCOUNTER — Other Ambulatory Visit (HOSPITAL_COMMUNITY): Payer: Self-pay

## 2023-10-10 LAB — SURGICAL PCR SCREEN
MRSA, PCR: NEGATIVE
Staphylococcus aureus: POSITIVE — AB

## 2023-10-10 MED ORDER — MUPIROCIN 2 % EX OINT
TOPICAL_OINTMENT | CUTANEOUS | 4 refills | Status: AC
  Filled 2023-10-10: qty 22, 30d supply, fill #0

## 2023-10-10 NOTE — Progress Notes (Signed)
 Patient's PCR screen is positive for STAPH. Appropriate notes have been placed on the patient's chart. This note has been routed to Dr. Cristy and Caroline McBane PA-C for review. The Patient's surgery is currently scheduled for:  10-19-2023  at [facility].  Shawnee Aloe, BSN, CVRN-BC   Pre-Surgical Testing Nurse [facility]  [phone]

## 2023-10-12 ENCOUNTER — Other Ambulatory Visit (HOSPITAL_COMMUNITY): Payer: Self-pay

## 2023-10-12 MED ORDER — SERTRALINE HCL 50 MG PO TABS
50.0000 mg | ORAL_TABLET | Freq: Every day | ORAL | 1 refills | Status: DC
  Filled 2023-10-12: qty 90, 90d supply, fill #0
  Filled 2023-11-26 – 2024-01-04 (×2): qty 90, 90d supply, fill #1

## 2023-10-14 ENCOUNTER — Other Ambulatory Visit (HOSPITAL_COMMUNITY): Payer: Self-pay

## 2023-10-14 ENCOUNTER — Other Ambulatory Visit: Payer: Self-pay

## 2023-10-14 DIAGNOSIS — H6122 Impacted cerumen, left ear: Secondary | ICD-10-CM | POA: Diagnosis not present

## 2023-10-14 DIAGNOSIS — B369 Superficial mycosis, unspecified: Secondary | ICD-10-CM | POA: Diagnosis not present

## 2023-10-14 DIAGNOSIS — H6242 Otitis externa in other diseases classified elsewhere, left ear: Secondary | ICD-10-CM | POA: Diagnosis not present

## 2023-10-14 MED ORDER — ACETIC ACID 2 % OT SOLN
OTIC | 2 refills | Status: AC
  Filled 2023-10-14: qty 15, 7d supply, fill #0
  Filled 2024-01-04: qty 15, 7d supply, fill #1

## 2023-10-14 NOTE — Care Plan (Signed)
Ortho Bundle Case Management Note  Patient Details  Name: Joseph Maldonado. MRN: [id number] Date of Birth: July 22, 1946   spoke with patient. He will discharge to home with family to assist. no DME needed. OPPT set up with [facility] discharge instructions mailed to patient and discussed.  Patient and MD in agreement. Choice offered                   DME Arranged:    DME Agency:     HH Arranged:    HH Agency:     Additional Comments: Please contact me with any questions of if this plan should need to change.  Shauna Hugh,  RN,BSN,MHA,CCM  [facility] Orthopaedic Specialist  [phone] 10/14/2023, 8:44 AM

## 2023-10-17 NOTE — H&P (Cosign Needed Addendum)
PREOPERATIVE H&P  Chief Complaint: djd left shoulder  HPI: Joseph Maldonado. is a 78 y.o. male who is scheduled for Procedure(s): REVERSE SHOULDER ARTHROPLASTY.   Patient has a past medical history significant for CAD, GERD, HTN, OSA on CPAP.   He has a known history of a cuff tear arthropathy on the left side. He is overall happy with his right shoulder. He had a right reverse total shoulder arthroplasty with Dr. Everardo Pacific last year. He wants to know what to do about the left shoulder.   Symptoms are rated as moderate to severe, and have been worsening.  This is significantly impairing activities of daily living.    Please see clinic note for further details on this patient's care.    He has elected for surgical management.   Past Medical History:  Diagnosis Date   Arthritis    Ascending aortic aneurysm (HCC)    CAD (coronary artery disease)    Chronic kidney disease    Depression    Dysrhythmia    occasional Palps   GERD (gastroesophageal reflux disease)    Hand pain    currently being evaluated by rheumatology for dx    Hematuria    Hypertension    Rapid heart beat    rpeorts  " 2 months ago i had a rapid heart beat that lasted 45 min to an hour, ive experience this twice in the last t10 years but only seconds long" " i was evaluated by cardiology Dr Marilynne Halsted who did a 30 day monitor and ECHO and said i have occasional [PATS] ? , denies chest pain nor LOC  during these episodes; HR today is WDL    Sleep apnea    CPAP    Past Surgical History:  Procedure Laterality Date   CATARACT EXTRACTION, BILATERAL     HIP SURGERY Left 2012   JOINT REPLACEMENT     REPLACEMENT TOTAL KNEE Right    2001,2006,2009,2013   REPLACEMENT TOTAL KNEE Left 2011   REVERSE SHOULDER ARTHROPLASTY Right 01/20/2023   Procedure: REVERSE SHOULDER ARTHROPLASTY;  Surgeon: Bjorn Pippin, MD;  Location: [facility];  Service: Orthopedics;  Laterality: Right;   TOTAL HIP ARTHROPLASTY  Right 08/14/2018   Procedure: TOTAL HIP ARTHROPLASTY ANTERIOR APPROACH;  Surgeon: Gean Birchwood, MD;  Location: [facility];  Service: Orthopedics;  Laterality: Right;   Social History   Socioeconomic History   Marital status: Married    Spouse name: Not on file   Number of children: 2   Years of education: Not on file   Highest education level: Bachelor's degree (e.g., BA, AB, BS)  Occupational History   Occupation: Retired  Tobacco Use   Smoking status: Former    Current packs/day: 0.00    Average packs/day: 0.5 packs/day for 3.0 years (1.5 ttl pk-yrs)    Types: Cigarettes    Start date: 1968    Quit date: 1971    Years since quitting: 54.0   Smokeless tobacco: Never  Vaping Use   Vaping status: Never Used  Substance and Sexual Activity   Alcohol use: Yes    Alcohol/week: 3.0 standard drinks of alcohol    Types: 3 Cans of beer per week    Comment: rarely   Drug use: No    Comment: former user of marijuana, quit in 1971   Sexual activity: Yes  Other Topics Concern   Not on file  Social History Narrative   Lives with his wife.   Education:  College   Exercise: No   Drinks about 2 large coffees per day   Right handed      Spent many years in [place].  Moved to [place] to be near their children.  Daughter lives in [place].  Their son has lived in the [place] area with plans to move to [place] in summer 2019.   Social Drivers of Corporate investment banker Strain: Low Risk  (08/08/2017)   Overall Financial Resource Strain (CARDIA)    Difficulty of Paying Living Expenses: Not hard at all  Food Insecurity: No Food Insecurity (08/08/2017)   Hunger Vital Sign    Worried About Running Out of Food in the Last Year: Never true    Ran Out of Food in the Last Year: Never true  Transportation Needs: No Transportation Needs (08/08/2017)   PRAPARE - Administrator, Civil Service (Medical): No    Lack of Transportation (Non-Medical): No  Physical Activity: Inactive  (08/08/2017)   Exercise Vital Sign    Days of Exercise per Week: 0 days    Minutes of Exercise per Session: 0 min  Stress: No Stress Concern Present (08/08/2017)   [employer] of Occupational Health - Occupational Stress Questionnaire    Feeling of Stress : Not at all  Social Connections: Socially Integrated (08/08/2017)   Social Connection and Isolation Panel [NHANES]    Frequency of Communication with Friends and Family: More than three times a week    Frequency of Social Gatherings with Friends and Family: Once a week    Attends Religious Services: More than 4 times per year    Active Member of [employer] or Organizations: Yes    Attends Engineer, structural: More than 4 times per year    Marital Status: Married   Family History  Problem Relation Age of Onset   Cancer Mother        throat   Hypertension Mother    Heart disease Mother    Emphysema Mother    Hypertension Father    Cancer Father        Brain   Hypertension Sister    Hematuria Sister    Hematuria Brother    Alzheimer's disease Neg Hx    Dementia Neg Hx    Allergies  Allergen Reactions   Nsaids     Affects creatinine   Dilaudid [Hydromorphone Hcl] Other (See Comments)    Suicidal   Sulfa Antibiotics     Other reaction(s): GI Intolerance   Prior to Admission medications   Medication Sig Start Date End Date Taking? Authorizing Provider  acetaminophen (TYLENOL) 500 MG tablet Take 1,000 mg by mouth in the morning and at bedtime.   Yes [provider]  atenolol (TENORMIN) 25 MG tablet Take 1 tablet (25 mg total) by mouth daily Patient taking differently: Take 25 mg by mouth every evening. 03/24/23  Yes   atorvastatin (LIPITOR) 20 MG tablet Take 1 tablet (20 mg total) by mouth daily. Patient taking differently: Take 20 mg by mouth every evening. 05/31/23  Yes   buPROPion ER (WELLBUTRIN SR) 100 MG 12 hr tablet Take 1 tablet (100 mg total) by mouth 2 (two) times daily for 30 days. 03/24/23  Yes    cholecalciferol (VITAMIN D3) 25 MCG (1000 UNIT) tablet Take 1,000 Units by mouth in the morning.   Yes [provider]  losartan (COZAAR) 100 MG tablet Take 1 tablet (100 mg) by mouth daily Patient taking differently: Take 100  mg by mouth every evening. 03/24/23  Yes   pantoprazole (PROTONIX) 20 MG tablet Take 1 tablet (20 mg total) by mouth daily. 11/23/22  Yes   sertraline (ZOLOFT) 50 MG tablet Take 0.5 tablets (25 mg total) by mouth daily for 14 days, THEN 1 tablet (50 mg total) daily . 09/19/23 10/26/23 Yes   sodium bicarbonate 650 MG tablet Take 1 tablet (650 mg total) by mouth 2 (two) times daily. 09/19/23  Yes   tizanidine (ZANAFLEX) 2 MG capsule Take 2 mg by mouth at bedtime as needed (stiffness/sleep.).   Yes [provider]  acetic acid 2 % otic solution Place 5 drops into the affected ear twice daily. 10/14/23     buPROPion ER (WELLBUTRIN SR) 100 MG 12 hr tablet Take 1 tablet by mouth once daily Patient not taking: Reported on 10/06/2023 03/15/23     methylPREDNISolone (MEDROL) 4 MG TBPK tablet Take as prescribed on dose pack over 6 days 07/17/23     Multiple Vitamin (MULTIVITAMIN WITH MINERALS) TABS tablet Take 1 tablet by mouth every evening. Patient not taking: Reported on 10/06/2023    [provider]  mupirocin ointment (BACTROBAN) 2 % Apply a small amount twice a day intranasally for 14 days. 10/10/23     sertraline (ZOLOFT) 50 MG tablet Take 1 tablet (50 mg total) by mouth daily. 10/12/23       ROS: All other systems have been reviewed and were otherwise negative with the exception of those mentioned in the HPI and as above.  Physical Exam: General: Alert, no acute distress Cardiovascular: No pedal edema Respiratory: No cyanosis, no use of accessory musculature GI: No organomegaly, abdomen is soft and non-tender Skin: No lesions in the area of chief complaint Neurologic: Sensation intact distally Psychiatric: Patient is competent for consent with normal  mood and affect Lymphatic: No axillary or cervical lymphadenopathy  MUSCULOSKELETAL:  The range of motion of the right shoulder active forward elevation is to 130 degrees, passive 150 degrees. The left shoulder active forward elevation to 150 degrees and passive 160 degrees. Cuff strength is weak throughout the left side with testing  Imaging: X-rays of the left shoulder were reviewed again and demonstrate proximal migration and significant erosion of the acromion. [place] 4B rotator cuff arthropathy.   BMI: Estimated body mass index is 30.42 kg/m as calculated from the following:   Height as of 10/07/23: 5\' 10"  (1.778 m).   Weight as of 10/07/23: 96.2 kg.  Lab Results  Component Value Date   ALBUMIN 4.2 02/26/2021   Diabetes: Patient does not have a diagnosis of diabetes.     Smoking Status:       Assessment: djd left shoulder  Plan: Plan for Procedure(s): REVERSE SHOULDER ARTHROPLASTY  The risks benefits and alternatives were discussed with the patient including but not limited to the risks of nonoperative treatment, versus surgical intervention including infection, bleeding, nerve injury,  blood clots, cardiopulmonary complications, morbidity, mortality, among others, and they were willing to proceed.   We additionally specifically discussed risks of axillary nerve injury, infection, periprosthetic fracture, continued pain and longevity of implants prior to beginning procedure.    Patient will be closely monitored in PACU for medical stabilization and pain control. If found stable in PACU, patient may be discharged home with outpatient follow-up. If any concerns regarding patient's stabilization patient will be admitted for observation after surgery. The patient is planning to be discharged home with outpatient PT.   The patient acknowledged the explanation, agreed  to proceed with the plan and consent was signed.   Operative Plan: Left reverse total shoulder  arthroplasty Discharge Medications: standard (avoid NSAIDs) DVT Prophylaxis: aspirin Physical Therapy: outpatient PT Special Discharge needs: has sling and iceman already   Vernetta Honey, PA-C  10/17/2023 9:21 AM

## 2023-10-18 NOTE — Discharge Instructions (Signed)
Ramond Marrow MD, MPH Alfonse Alpers, PA-C [facility] [address] [phone] (tel)   [phone] (fax)   POST-OPERATIVE INSTRUCTIONS - TOTAL SHOULDER REPLACEMENT    WOUND CARE You may leave the operative dressing in place until your follow-up appointment. KEEP THE INCISIONS CLEAN AND DRY. There may be a small amount of fluid/bleeding leaking at the surgical site. This is normal after surgery.  If it fills with liquid or blood please call us immediately to change it for you. Use the provided ice machine or Ice packs as often as possible for the first 3-4 days, then as needed for pain relief.   Keep a layer of cloth or a shirt between your skin and the cooling unit to prevent frost bite as it can get very cold.  SHOWERING: - You may shower on Post-Op Day #2.  - The dressing is water resistant but do not scrub it as it may start to peel up.   - You may remove the sling for showering - Gently pat the area dry.  - Do not soak the shoulder in water.  - Do not go swimming in the pool or ocean until your incision has completely healed (about 4-6 weeks after surgery) - KEEP THE INCISIONS CLEAN AND DRY.  EXERCISES Wear the sling at all times  You may remove the sling for showering, but keep the arm across the chest or in a secondary sling.    Accidental/Purposeful External Rotation and shoulder flexion (reaching behind you) is to be avoided at all costs for the first month. It is ok to come out of your sling if your are sitting and have assistance for eating.   Do not lift anything heavier than 1 pound until we discuss it further in clinic.  It is normal for your fingers/hand to become more swollen after surgery and discolored from bruising.   This will resolve over the first few weeks usually after surgery. Please continue to ambulate and do not stay sitting or lying for too long.  Perform foot and wrist pumps to assist in circulation.  PHYSICAL  THERAPY - You will begin physical therapy soon after surgery (unless otherwise specified) - Please call to set up an appointment, if you do not already have one  - Let our office if there are any issues with scheduling your therapy  - You have a physical therapy appointment scheduled at [facility] (across the hall from our office) on 1/24   REGIONAL ANESTHESIA (NERVE BLOCKS) The anesthesia team may have performed a nerve block for you this is a great tool used to minimize pain.   The block may start wearing off overnight (between 8-24 hours postop) When the block wears off, your pain may go from nearly zero to the pain you would have had postop without the block. This is an abrupt transition but nothing dangerous is happening.   This can be a challenging period but utilize your as needed pain medications to try and manage this period. We suggest you use the pain medication the first night prior to going to bed, to ease this transition.  You may take an extra dose of narcotic when this happens if needed   POST-OP MEDICATIONS- Multimodal approach to pain control In general your pain will be controlled with a combination of substances.  Prescriptions unless otherwise discussed are electronically sent to your pharmacy.  This is a carefully made plan we use to minimize narcotic use.  Acetaminophen - Non-narcotic pain medicine taken on a scheduled basis  Oxycodone - This is a strong narcotic, to be used only on an "as needed" basis for SEVERE pain. Aspirin 81mg  - This medicine is used to minimize the risk of blood clots after surgery.  Zofran -  take as needed for nausea Robaxin - this is a muscle relaxer, take as needed for muscle spasms   FOLLOW-UP If you develop a Fever (>101.5), Redness or Drainage from the surgical incision site, please call our office to arrange for an evaluation. Please call the office to schedule a follow-up appointment for a wound check, 7-10 days  post-operatively.  IF YOU HAVE ANY QUESTIONS, PLEASE FEEL FREE TO CALL OUR OFFICE.  HELPFUL INFORMATION  Your arm will be in a sling following surgery. You will be in this sling for the next 4 weeks.   You may be more comfortable sleeping in a semi-seated position the first few nights following surgery.  Keep a pillow propped under the elbow and forearm for comfort.  If you have a recliner type of chair it might be beneficial.  If not that is fine too, but it would be helpful to sleep propped up with pillows behind your operated shoulder as well under your elbow and forearm.  This will reduce pulling on the suture lines.  When dressing, put your operative arm in the sleeve first.  When getting undressed, take your operative arm out last.  Loose fitting, button-down shirts are recommended.  In most states it is against the law to drive while your arm is in a sling. And certainly against the law to drive while taking narcotics.  You may return to work/school in the next couple of days when you feel up to it. Desk work and typing in the sling is fine.  We suggest you use the pain medication the first night prior to going to bed, in order to ease any pain when the anesthesia wears off. You should avoid taking pain medications on an empty stomach as it will make you nauseous.  You should wean off your narcotic medicines as soon as you are able.     Most patients will be off narcotics before their first postop appointment.   Do not drink alcoholic beverages or take illicit drugs when taking pain medications.  Pain medication may make you constipated.  Below are a few solutions to try in this order: Decrease the amount of pain medication if you aren't having pain. Drink lots of decaffeinated fluids. Drink prune juice and/or each dried prunes  If the first 3 don't work start with additional solutions Take Colace - an over-the-counter stool softener Take Senokot - an over-the-counter  laxative Take Miralax - a stronger over-the-counter laxative   Dental Antibiotics:  In most cases prophylactic antibiotics for Dental procdeures after total joint surgery are not necessary.  Exceptions are as follows:  1. History of prior total joint infection  2. Severely immunocompromised (Organ Transplant, cancer chemotherapy, Rheumatoid biologic meds such as Humira)  3. Poorly controlled diabetes (A1C &gt; 8.0, blood glucose over 200)  If you have one of these conditions, contact your surgeon for an antibiotic prescription, prior to your dental procedure.   For more information including helpful videos and documents visit our website:   [email or web address]

## 2023-10-19 ENCOUNTER — Encounter (HOSPITAL_COMMUNITY): Payer: Self-pay | Admitting: Orthopaedic Surgery

## 2023-10-19 ENCOUNTER — Encounter (HOSPITAL_COMMUNITY)
Admission: RE | Disposition: A | Discharge status: Home / Self Care | Payer: Self-pay | Source: Ambulatory Visit | Attending: Orthopaedic Surgery

## 2023-10-19 ENCOUNTER — Ambulatory Visit (HOSPITAL_BASED_OUTPATIENT_CLINIC_OR_DEPARTMENT_OTHER): Payer: Medicare Other | Admitting: Anesthesiology

## 2023-10-19 ENCOUNTER — Observation Stay (HOSPITAL_COMMUNITY): Payer: Medicare Other

## 2023-10-19 ENCOUNTER — Observation Stay (HOSPITAL_COMMUNITY)
Admission: RE | Admit: 2023-10-19 | Discharge: 2023-10-20 | Disposition: A | Discharge status: Home / Self Care | Payer: Medicare Other | Source: Ambulatory Visit | Attending: Orthopaedic Surgery | Admitting: Orthopaedic Surgery

## 2023-10-19 ENCOUNTER — Other Ambulatory Visit: Payer: Self-pay

## 2023-10-19 ENCOUNTER — Ambulatory Visit (HOSPITAL_COMMUNITY): Payer: Medicare Other | Admitting: Physician Assistant

## 2023-10-19 DIAGNOSIS — I2510 Atherosclerotic heart disease of native coronary artery without angina pectoris: Secondary | ICD-10-CM | POA: Diagnosis not present

## 2023-10-19 DIAGNOSIS — M19012 Primary osteoarthritis, left shoulder: Secondary | ICD-10-CM | POA: Diagnosis not present

## 2023-10-19 DIAGNOSIS — I129 Hypertensive chronic kidney disease with stage 1 through stage 4 chronic kidney disease, or unspecified chronic kidney disease: Secondary | ICD-10-CM | POA: Insufficient documentation

## 2023-10-19 DIAGNOSIS — Z96641 Presence of right artificial hip joint: Secondary | ICD-10-CM | POA: Insufficient documentation

## 2023-10-19 DIAGNOSIS — Z96653 Presence of artificial knee joint, bilateral: Secondary | ICD-10-CM | POA: Diagnosis not present

## 2023-10-19 DIAGNOSIS — Z96611 Presence of right artificial shoulder joint: Secondary | ICD-10-CM | POA: Diagnosis not present

## 2023-10-19 DIAGNOSIS — Z87891 Personal history of nicotine dependence: Secondary | ICD-10-CM | POA: Insufficient documentation

## 2023-10-19 DIAGNOSIS — N189 Chronic kidney disease, unspecified: Secondary | ICD-10-CM | POA: Diagnosis not present

## 2023-10-19 DIAGNOSIS — Z96612 Presence of left artificial shoulder joint: Principal | ICD-10-CM

## 2023-10-19 DIAGNOSIS — I10 Essential (primary) hypertension: Secondary | ICD-10-CM | POA: Diagnosis not present

## 2023-10-19 DIAGNOSIS — Z79899 Other long term (current) drug therapy: Secondary | ICD-10-CM | POA: Diagnosis not present

## 2023-10-19 DIAGNOSIS — M75102 Unspecified rotator cuff tear or rupture of left shoulder, not specified as traumatic: Secondary | ICD-10-CM | POA: Diagnosis not present

## 2023-10-19 DIAGNOSIS — Z471 Aftercare following joint replacement surgery: Secondary | ICD-10-CM | POA: Diagnosis not present

## 2023-10-19 DIAGNOSIS — G8918 Other acute postprocedural pain: Secondary | ICD-10-CM | POA: Diagnosis not present

## 2023-10-19 HISTORY — PX: REVERSE SHOULDER ARTHROPLASTY: SHX5054

## 2023-10-19 SURGERY — ARTHROPLASTY, SHOULDER, TOTAL, REVERSE
Anesthesia: General | Site: Shoulder | Laterality: Left

## 2023-10-19 MED ORDER — BISACODYL 10 MG RE SUPP: 10.0000 mg | Freq: Every day | RECTAL | Status: DC | PRN

## 2023-10-19 MED ORDER — ROCURONIUM BROMIDE 10 MG/ML (PF) SYRINGE
PREFILLED_SYRINGE | INTRAVENOUS | Status: AC
Start: 2023-10-19 — End: ?
  Filled 2023-10-19: qty 10

## 2023-10-19 MED ORDER — SUGAMMADEX SODIUM 200 MG/2ML IV SOLN
INTRAVENOUS | Status: DC | PRN
  Administered 2023-10-19: 200 mg via INTRAVENOUS

## 2023-10-19 MED ORDER — LACTATED RINGERS IV SOLN: INTRAVENOUS | Status: DC

## 2023-10-19 MED ORDER — OXYCODONE HCL 5 MG PO TABS
10.0000 mg | ORAL_TABLET | ORAL | Status: DC | PRN
  Filled 2023-10-19: qty 2

## 2023-10-19 MED ORDER — ROCURONIUM BROMIDE 100 MG/10ML IV SOLN
INTRAVENOUS | Status: DC | PRN
  Administered 2023-10-19: 50 mg via INTRAVENOUS

## 2023-10-19 MED ORDER — CEFAZOLIN SODIUM-DEXTROSE 2-4 GM/100ML-% IV SOLN
2.0000 g | INTRAVENOUS | Status: AC
  Administered 2023-10-19: 2 g via INTRAVENOUS
  Filled 2023-10-19: qty 100

## 2023-10-19 MED ORDER — VANCOMYCIN HCL IN DEXTROSE 1-5 GM/200ML-% IV SOLN
1000.0000 mg | INTRAVENOUS | Status: AC
  Administered 2023-10-19: 1000 mg via INTRAVENOUS
  Filled 2023-10-19: qty 200

## 2023-10-19 MED ORDER — ACETAMINOPHEN 500 MG PO TABS
1000.0000 mg | ORAL_TABLET | Freq: Four times a day (QID) | ORAL | Status: AC
  Administered 2023-10-19 (×3): 1000 mg via ORAL
  Filled 2023-10-19 (×2): qty 2

## 2023-10-19 MED ORDER — GLYCOPYRROLATE 0.2 MG/ML IJ SOLN
INTRAMUSCULAR | Status: AC
  Filled 2023-10-19: qty 1

## 2023-10-19 MED ORDER — LOSARTAN POTASSIUM 50 MG PO TABS
100.0000 mg | ORAL_TABLET | Freq: Every evening | ORAL | Status: DC
  Administered 2023-10-19: 100 mg via ORAL
  Filled 2023-10-19: qty 2

## 2023-10-19 MED ORDER — MENTHOL 3 MG MT LOZG: 1.0000 | LOZENGE | OROMUCOSAL | Status: DC | PRN

## 2023-10-19 MED ORDER — SERTRALINE HCL 50 MG PO TABS
50.0000 mg | ORAL_TABLET | Freq: Every day | ORAL | Status: DC
  Administered 2023-10-20: 50 mg via ORAL
  Filled 2023-10-19: qty 1

## 2023-10-19 MED ORDER — ONDANSETRON HCL 4 MG/2ML IJ SOLN
INTRAMUSCULAR | Status: DC | PRN
  Administered 2023-10-19: 4 mg via INTRAVENOUS

## 2023-10-19 MED ORDER — CEFAZOLIN SODIUM-DEXTROSE 2-4 GM/100ML-% IV SOLN
2.0000 g | Freq: Four times a day (QID) | INTRAVENOUS | Status: AC
  Administered 2023-10-19 (×2): 2 g via INTRAVENOUS
  Filled 2023-10-19 (×2): qty 100

## 2023-10-19 MED ORDER — CHLORHEXIDINE GLUCONATE 0.12 % MT SOLN
15.0000 mL | Freq: Once | OROMUCOSAL | Status: AC
  Administered 2023-10-19: 15 mL via OROMUCOSAL

## 2023-10-19 MED ORDER — MAGNESIUM CITRATE PO SOLN: 1.0000 | Freq: Once | ORAL | Status: DC | PRN

## 2023-10-19 MED ORDER — FENTANYL CITRATE PF 50 MCG/ML IJ SOSY
25.0000 ug | PREFILLED_SYRINGE | INTRAMUSCULAR | Status: DC | PRN
Start: 2023-10-19 — End: 2023-10-19
  Administered 2023-10-19 (×3): 50 ug via INTRAVENOUS

## 2023-10-19 MED ORDER — TRANEXAMIC ACID-NACL 1000-0.7 MG/100ML-% IV SOLN
1000.0000 mg | INTRAVENOUS | Status: AC
  Administered 2023-10-19: 1000 mg via INTRAVENOUS
  Filled 2023-10-19: qty 100

## 2023-10-19 MED ORDER — STERILE WATER FOR IRRIGATION IR SOLN
Status: DC | PRN
  Administered 2023-10-19: 1000 mL

## 2023-10-19 MED ORDER — FENTANYL CITRATE (PF) 100 MCG/2ML IJ SOLN
INTRAMUSCULAR | Status: DC | PRN
  Administered 2023-10-19 (×2): 50 ug via INTRAVENOUS

## 2023-10-19 MED ORDER — METOCLOPRAMIDE HCL 5 MG PO TABS
5.0000 mg | ORAL_TABLET | Freq: Three times a day (TID) | ORAL | Status: DC | PRN
Start: 2023-10-19 — End: 2023-10-20

## 2023-10-19 MED ORDER — PHENYLEPHRINE 80 MCG/ML (10ML) SYRINGE FOR IV PUSH (FOR BLOOD PRESSURE SUPPORT)
PREFILLED_SYRINGE | INTRAVENOUS | Status: AC
  Filled 2023-10-19: qty 10

## 2023-10-19 MED ORDER — ATORVASTATIN CALCIUM 20 MG PO TABS
20.0000 mg | ORAL_TABLET | Freq: Every evening | ORAL | Status: DC
Start: 2023-10-19 — End: 2023-10-20
  Administered 2023-10-19: 20 mg via ORAL
  Filled 2023-10-19: qty 1

## 2023-10-19 MED ORDER — OXYCODONE HCL 5 MG PO TABS
5.0000 mg | ORAL_TABLET | ORAL | Status: DC | PRN
  Administered 2023-10-19 – 2023-10-20 (×4): 10 mg via ORAL
  Filled 2023-10-19 (×2): qty 2

## 2023-10-19 MED ORDER — ONDANSETRON HCL 4 MG PO TABS
4.0000 mg | ORAL_TABLET | Freq: Four times a day (QID) | ORAL | Status: DC | PRN
Start: 2023-10-19 — End: 2023-10-20

## 2023-10-19 MED ORDER — METHOCARBAMOL 500 MG PO TABS
ORAL_TABLET | ORAL | Status: AC
  Filled 2023-10-19: qty 1

## 2023-10-19 MED ORDER — PANTOPRAZOLE SODIUM 20 MG PO TBEC
20.0000 mg | DELAYED_RELEASE_TABLET | Freq: Every day | ORAL | Status: DC
Start: 2023-10-20 — End: 2023-10-20
  Administered 2023-10-20: 20 mg via ORAL
  Filled 2023-10-19: qty 1

## 2023-10-19 MED ORDER — DEXAMETHASONE SODIUM PHOSPHATE 10 MG/ML IJ SOLN
INTRAMUSCULAR | Status: DC | PRN
  Administered 2023-10-19: 8 mg via INTRAVENOUS

## 2023-10-19 MED ORDER — EPHEDRINE 5 MG/ML INJ
INTRAVENOUS | Status: AC
  Filled 2023-10-19: qty 5

## 2023-10-19 MED ORDER — VANCOMYCIN HCL 1000 MG IV SOLR
INTRAVENOUS | Status: AC
  Filled 2023-10-19: qty 20

## 2023-10-19 MED ORDER — ORAL CARE MOUTH RINSE: 15.0000 mL | Freq: Once | OROMUCOSAL | Status: AC

## 2023-10-19 MED ORDER — PROPOFOL 10 MG/ML IV BOLUS
INTRAVENOUS | Status: AC
  Filled 2023-10-19: qty 20

## 2023-10-19 MED ORDER — METHOCARBAMOL 500 MG PO TABS
500.0000 mg | ORAL_TABLET | Freq: Four times a day (QID) | ORAL | Status: DC | PRN
  Administered 2023-10-19 (×2): 500 mg via ORAL
  Filled 2023-10-19: qty 1

## 2023-10-19 MED ORDER — METHOCARBAMOL 1000 MG/10ML IJ SOLN: 500.0000 mg | Freq: Four times a day (QID) | INTRAMUSCULAR | Status: DC | PRN

## 2023-10-19 MED ORDER — GLYCOPYRROLATE 0.2 MG/ML IJ SOLN
INTRAMUSCULAR | Status: DC | PRN
  Administered 2023-10-19: .2 mg via INTRAVENOUS

## 2023-10-19 MED ORDER — PROPOFOL 10 MG/ML IV BOLUS
INTRAVENOUS | Status: DC | PRN
  Administered 2023-10-19: 14 mg via INTRAVENOUS

## 2023-10-19 MED ORDER — VANCOMYCIN HCL 1 G IV SOLR
INTRAVENOUS | Status: DC | PRN
  Administered 2023-10-19: 1000 mg via TOPICAL

## 2023-10-19 MED ORDER — DEXAMETHASONE SODIUM PHOSPHATE 10 MG/ML IJ SOLN
INTRAMUSCULAR | Status: AC
  Filled 2023-10-19: qty 1

## 2023-10-19 MED ORDER — ACETAMINOPHEN 500 MG PO TABS
ORAL_TABLET | ORAL | Status: AC
  Filled 2023-10-19: qty 2

## 2023-10-19 MED ORDER — MIDAZOLAM HCL 2 MG/2ML IJ SOLN
2.0000 mg | Freq: Once | INTRAMUSCULAR | Status: AC
  Administered 2023-10-19: 1 mg via INTRAVENOUS
  Filled 2023-10-19: qty 2

## 2023-10-19 MED ORDER — FENTANYL CITRATE PF 50 MCG/ML IJ SOSY
100.0000 ug | PREFILLED_SYRINGE | Freq: Once | INTRAMUSCULAR | Status: AC
  Administered 2023-10-19: 50 ug via INTRAVENOUS
  Filled 2023-10-19: qty 2

## 2023-10-19 MED ORDER — EPHEDRINE SULFATE (PRESSORS) 50 MG/ML IJ SOLN
INTRAMUSCULAR | Status: DC | PRN
  Administered 2023-10-19 (×2): 10 mg via INTRAVENOUS

## 2023-10-19 MED ORDER — ATENOLOL 50 MG PO TABS
25.0000 mg | ORAL_TABLET | Freq: Every evening | ORAL | Status: DC
Start: 2023-10-19 — End: 2023-10-20
  Administered 2023-10-19: 25 mg via ORAL
  Filled 2023-10-19: qty 1

## 2023-10-19 MED ORDER — ORAL CARE MOUTH RINSE: 15.0000 mL | OROMUCOSAL | Status: DC | PRN

## 2023-10-19 MED ORDER — ONDANSETRON HCL 4 MG/2ML IJ SOLN: 4.0000 mg | Freq: Four times a day (QID) | INTRAMUSCULAR | Status: DC | PRN

## 2023-10-19 MED ORDER — METOCLOPRAMIDE HCL 5 MG/ML IJ SOLN: 5.0000 mg | Freq: Three times a day (TID) | INTRAMUSCULAR | Status: DC | PRN

## 2023-10-19 MED ORDER — FENTANYL CITRATE (PF) 100 MCG/2ML IJ SOLN
INTRAMUSCULAR | Status: AC
  Filled 2023-10-19: qty 2

## 2023-10-19 MED ORDER — PHENYLEPHRINE HCL (PRESSORS) 10 MG/ML IV SOLN
INTRAVENOUS | Status: DC | PRN
  Administered 2023-10-19 (×4): 160 ug via INTRAVENOUS
  Administered 2023-10-19: 80 ug via INTRAVENOUS

## 2023-10-19 MED ORDER — SODIUM CHLORIDE 0.9 % IR SOLN
Status: DC | PRN
  Administered 2023-10-19: 1000 mL

## 2023-10-19 MED ORDER — ACETAMINOPHEN 500 MG PO TABS
1000.0000 mg | ORAL_TABLET | Freq: Once | ORAL | Status: DC
  Filled 2023-10-19: qty 2

## 2023-10-19 MED ORDER — FENTANYL CITRATE PF 50 MCG/ML IJ SOSY
PREFILLED_SYRINGE | INTRAMUSCULAR | Status: AC
  Filled 2023-10-19: qty 2

## 2023-10-19 MED ORDER — BUPIVACAINE LIPOSOME 1.3 % IJ SUSP
INTRAMUSCULAR | Status: DC | PRN
Start: 2023-10-19 — End: 2023-10-19
  Administered 2023-10-19: 10 mL via PERINEURAL

## 2023-10-19 MED ORDER — DOCUSATE SODIUM 100 MG PO CAPS
100.0000 mg | ORAL_CAPSULE | Freq: Two times a day (BID) | ORAL | Status: DC
  Administered 2023-10-19 – 2023-10-20 (×3): 100 mg via ORAL
  Filled 2023-10-19 (×3): qty 1

## 2023-10-19 MED ORDER — OXYCODONE HCL 5 MG PO TABS
ORAL_TABLET | ORAL | Status: AC
  Filled 2023-10-19: qty 2

## 2023-10-19 MED ORDER — POLYETHYLENE GLYCOL 3350 17 G PO PACK: 17.0000 g | PACK | Freq: Every day | ORAL | Status: DC | PRN

## 2023-10-19 MED ORDER — LIDOCAINE HCL (PF) 2 % IJ SOLN
INTRAMUSCULAR | Status: AC
  Filled 2023-10-19: qty 5

## 2023-10-19 MED ORDER — LACTATED RINGERS IV SOLN: INTRAVENOUS | Status: DC | PRN

## 2023-10-19 MED ORDER — BUPIVACAINE HCL (PF) 0.5 % IJ SOLN
INTRAMUSCULAR | Status: DC | PRN
  Administered 2023-10-19: 10 mL via PERINEURAL

## 2023-10-19 MED ORDER — PHENOL 1.4 % MT LIQD: 1.0000 | OROMUCOSAL | Status: DC | PRN

## 2023-10-19 MED ORDER — POVIDONE-IODINE 10 % EX SOLN: Freq: Once | CUTANEOUS | Status: AC

## 2023-10-19 MED ORDER — DIPHENHYDRAMINE HCL 12.5 MG/5ML PO ELIX
12.5000 mg | ORAL_SOLUTION | ORAL | Status: DC | PRN
Start: 2023-10-19 — End: 2023-10-20

## 2023-10-19 MED ORDER — ONDANSETRON HCL 4 MG/2ML IJ SOLN
4.0000 mg | Freq: Once | INTRAMUSCULAR | Status: DC | PRN
Start: 2023-10-19 — End: 2023-10-19

## 2023-10-19 MED ORDER — ONDANSETRON HCL 4 MG/2ML IJ SOLN
INTRAMUSCULAR | Status: AC
Start: 2023-10-19 — End: ?
  Filled 2023-10-19: qty 2

## 2023-10-19 MED ORDER — BUPROPION HCL ER (SR) 100 MG PO TB12
100.0000 mg | ORAL_TABLET | Freq: Two times a day (BID) | ORAL | Status: DC
  Administered 2023-10-20: 100 mg via ORAL
  Filled 2023-10-19 (×2): qty 1

## 2023-10-19 MED ORDER — FENTANYL CITRATE PF 50 MCG/ML IJ SOSY
PREFILLED_SYRINGE | INTRAMUSCULAR | Status: AC
  Filled 2023-10-19: qty 1

## 2023-10-19 MED ORDER — LIDOCAINE HCL (CARDIAC) PF 100 MG/5ML IV SOSY
PREFILLED_SYRINGE | INTRAVENOUS | Status: DC | PRN
  Administered 2023-10-19: 60 mg via INTRAVENOUS

## 2023-10-19 SURGICAL SUPPLY — 57 items
BAG COUNTER SPONGE SURGICOUNT (BAG) ×1 IMPLANT
BASEPLATE SHOULDER FW 15D 29 (Joint) IMPLANT
BIT DRILL 3.2 PERIPHERAL SCREW (BIT) IMPLANT
BLADE SAW SGTL 73X25 THK (BLADE) ×1 IMPLANT
CHLORAPREP W/TINT 26 (MISCELLANEOUS) ×2 IMPLANT
CLSR STERI-STRIP ANTIMIC 1/2X4 (GAUZE/BANDAGES/DRESSINGS) ×1 IMPLANT
COOLER ICEMAN CLASSIC (MISCELLANEOUS) IMPLANT
COVER BACK TABLE 60X90IN (DRAPES) ×1 IMPLANT
COVER SURGICAL LIGHT HANDLE (MISCELLANEOUS) ×1 IMPLANT
CUP HUM INSERT SHLD 3/4 39 +0 (Cup) IMPLANT
DRAPE C-ARM 42X120 X-RAY (DRAPES) IMPLANT
DRAPE INCISE IOBAN 66X45 STRL (DRAPES) ×1 IMPLANT
DRAPE POUCH INSTRU U-SHP 10X18 (DRAPES) ×1 IMPLANT
DRAPE SHEET LG 3/4 BI-LAMINATE (DRAPES) ×2 IMPLANT
DRAPE SURG ORHT 6 SPLT 77X108 (DRAPES) ×2 IMPLANT
DRSG AQUACEL AG ADV 3.5X 6 (GAUZE/BANDAGES/DRESSINGS) ×1 IMPLANT
ELECT BLADE TIP CTD 4 INCH (ELECTRODE) ×1 IMPLANT
ELECT PENCIL ROCKER SW 15FT (MISCELLANEOUS) IMPLANT
ELECT REM PT RETURN 15FT ADLT (MISCELLANEOUS) ×1 IMPLANT
FACESHIELD WRAPAROUND (MASK) ×3 IMPLANT
FACESHIELD WRAPAROUND OR TEAM (MASK) ×3 IMPLANT
GLENOSPHERE STANDARD 39 (Joint) ×1 IMPLANT
GLENOSPHERE STD 39 (Joint) IMPLANT
GLOVE BIO SURGEON STRL SZ 6.5 (GLOVE) ×2 IMPLANT
GLOVE BIOGEL PI IND STRL 6.5 (GLOVE) ×1 IMPLANT
GLOVE BIOGEL PI IND STRL 8 (GLOVE) ×1 IMPLANT
GLOVE ECLIPSE 8.0 STRL XLNG CF (GLOVE) ×2 IMPLANT
GOWN STRL REUS W/ TWL LRG LVL3 (GOWN DISPOSABLE) ×2 IMPLANT
GUIDE PIN 3X75 SHOULDER (PIN) ×1 IMPLANT
GUIDEWIRE GLENOID 2.5X220 (WIRE) IMPLANT
KIT BASIN OR (CUSTOM PROCEDURE TRAY) ×1 IMPLANT
KIT STABILIZATION SHOULDER (MISCELLANEOUS) ×1 IMPLANT
KIT TURNOVER KIT A (KITS) IMPLANT
MANIFOLD NEPTUNE II (INSTRUMENTS) ×1 IMPLANT
NS IRRIG 1000ML POUR BTL (IV SOLUTION) ×1 IMPLANT
PACK SHOULDER (CUSTOM PROCEDURE TRAY) ×1 IMPLANT
PAD COLD SHLDR WRAP-ON (PAD) IMPLANT
PIN GUIDE 3X75 SHOULDER (PIN) IMPLANT
RESTRAINT HEAD UNIVERSAL NS (MISCELLANEOUS) ×1 IMPLANT
SCREW 5.5X22 (Screw) IMPLANT
SCREW 5.5X26 (Screw) IMPLANT
SCREW BONE 6.5X40 SM (Screw) IMPLANT
SCREW PERIPHERAL 5.0X34 (Screw) IMPLANT
SET HNDPC FAN SPRY TIP SCT (DISPOSABLE) ×1 IMPLANT
SPONGE T-LAP 4X18 ~~LOC~~+RFID (SPONGE) ×1 IMPLANT
STEM HUM PERFORM 4 (Stem) IMPLANT
SUCTION TUBE FRAZIER 12FR DISP (SUCTIONS) IMPLANT
SUT ETHIBOND 2 V 37 (SUTURE) ×1 IMPLANT
SUT ETHIBOND NAB CT1 #1 30IN (SUTURE) ×1 IMPLANT
SUT FIBERWIRE #5 38 CONV NDL (SUTURE) ×2 IMPLANT
SUT MNCRL AB 4-0 PS2 18 (SUTURE) ×1 IMPLANT
SUT VIC AB 0 CT1 36 (SUTURE) IMPLANT
SUT VIC AB 3-0 SH 27X BRD (SUTURE) ×1 IMPLANT
SUTURE FIBERWR #5 38 CONV NDL (SUTURE) ×2 IMPLANT
TOWEL OR 17X26 10 PK STRL BLUE (TOWEL DISPOSABLE) ×1 IMPLANT
TUBE SUCTION HIGH CAP CLEAR NV (SUCTIONS) ×1 IMPLANT
WATER STERILE IRR 1000ML POUR (IV SOLUTION) ×2 IMPLANT

## 2023-10-19 NOTE — Progress Notes (Signed)
   10/19/23 2239  BiPAP/CPAP/SIPAP  $ Non-Invasive Home Ventilator  Initial  BiPAP/CPAP/SIPAP Pt Type Adult  BiPAP/CPAP/SIPAP DREAMSTATIOND  Mask Type Full face mask  Respiratory Rate 18 breaths/min  EPAP 13 cmH2O  Patient Home Equipment No  Auto Titrate No

## 2023-10-19 NOTE — Op Note (Signed)
Orthopaedic Surgery Operative Note (CSN: [id number])  Joseph May.  December 03, 1945 Date of Surgery: 10/19/2023   Diagnoses:  Left shoulder end stage glenohumeral arthritis with irreparable rotator cuff tear  Procedure: Left reverse augmented total Shoulder Arthroplasty   Operative Finding Successful completion of planned procedure.  Patient had significant tearing and retraction of his subscapularis remnant.  We were not able to perform a tug test secondary to this.  We stayed away from the inferior axillary recess.  Implantation was relatively routine though bone was quite sclerotic as was his other side.  Post-operative plan: The patient will be NWB in sling.  The patient will be will be admitted to observation due to medical complexity, monitoring and pain management.  DVT prophylaxis Aspirin 81 mg twice daily for 6 weeks.  Pain control with PRN pain medication preferring oral medicines.  Follow up plan will be scheduled in approximately 7 days for incision check and XR.  Physical therapy to start after discharge.  Implants: Tornier perform humeral size 4 standard stem, 0 retentive polyethylene, 39 standard glenosphere with a 29 full wedge baseplate, 40 center screw and 4 peripheral screws  Post-Op Diagnosis: Same Surgeons:Primary: Bjorn Pippin, MD Assistants:Caroline McBane, PA-C Location: [facility] ROOM 09 Anesthesia: General with Exparel Interscalene Antibiotics: Ancef 2g preop, Vancomycin 1000mg  locally Tourniquet time: None Estimated Blood Loss: 100 Complications: None Specimens: None Implants: Implant Name Type Inv. Item Serial No. Manufacturer Lot No. LRB No. Used Action  BASEPLATE SHOULDER FW 15D 29 - [id number] Joint BASEPLATE SHOULDER FW 15D 29 [id number] [employer]  Left 1 Implanted  GLENOSPHERE STANDARD 39 - [id number] Joint GLENOSPHERE STANDARD 39 [id number] [employer]  Left 1 Implanted  STEM HUM PERFORM 4 - [id number] Stem STEM HUM PERFORM 4 [id number] [employer]   Left 1 Implanted  CUP HUM INSERT SHLD 3/4 39 +0 - [id number] Cup CUP HUM INSERT SHLD 3/4 39 +0 [id number] [employer]  Left 1 Implanted    Indications for Surgery:   Joseph Maldonado. is a 78 y.o. male with end-stage rotator cuff arthropathy.  Benefits and risks of operative and nonoperative management were discussed prior to surgery with patient/guardian(s) and informed consent form was completed.  Infection and need for further surgery were discussed as was prosthetic stability and cuff issues.  We additionally specifically discussed risks of axillary nerve injury, infection, periprosthetic fracture, continued pain and longevity of implants prior to beginning procedure.      Procedure:   The patient was identified in the preoperative holding area where the surgical site was marked. Block placed by anesthesia with exparel.  The patient was taken to the OR where a procedural timeout was called and the above noted anesthesia was induced.  The patient was positioned beachchair on allen table with spider arm positioner.  Preoperative antibiotics were dosed.  The patient's left shoulder was prepped and draped in the usual sterile fashion.  A second preoperative timeout was called.       Standard deltopectoral approach was performed with a #10 blade. We dissected down to the subcutaneous tissues and the cephalic vein was taken laterally with the deltoid. Clavipectoral fascia was incised in line with the incision. Deep retractors were placed. The long of the biceps tendon was identified and there was significant tenosynovitis present.  Tenodesis was performed to the pectoralis tendon with #2 Ethibond. The remaining biceps was followed up into the rotator interval where it was released.   The subscapularis was taken down in a full thickness layer  with capsule along the humeral neck extending inferiorly around the humeral head. We continued releasing the capsule directly off of the osteophytes inferiorly all  the way around the corner. This allowed [name] to dislocate the humeral head.   The humeral head had evidence of severe osteoarthritic wear with full-thickness cartilage loss and exposed subchondral bone. There was significant flattening of the humeral head.   The rotator cuff was carefully examined and noted to be irreperably torn.  The decision was confirmed that a reverse total shoulder was indicated for this patient.  There were osteophytes along the inferior humeral neck. The osteophytes were removed with an osteotome and a rongeur.  Osteophytes were removed with a rongeur and an osteotome and the anatomic neck was well visualized.     A humeral cutting guide was used extra medullary with a pin to help control version. The version was set at 20 of retroversion. Humeral osteotomy was performed with an oscillating saw. The head fragment was passed off the back table.  A cut protector plate was placed.  The subscapularis was again identified and immediately we took care to palpate the axillary nerve anteriorly and verify its position with gentle palpation as well as the tug test.  We then released the SGHL with bovie cautery prior to placing a curved mayo at the junction of the anterior glenoid well above the axillary nerve and bluntly dissecting the subscapularis from the capsule.  We then carefully protected the axillary nerve as we gently released the inferior capsule to fully mobilize the subscapularis.  An anterior deltoid retractor was then placed as well as a small Hohmann retractor superiorly.   The glenoid was inspected and had evidence of severe osteoarthritic wear with full-thickness cartilage loss and exposed subchondral bone.   The remaining labrum was removed circumferentially taking great care not to disrupt the posterior capsule.   At this point we felt based on blueprint templating that a full wedge augment was necessary.  We began by using a full wedge guide to place our center pin  as was templated.  We had good position of this pin and we proceeded with our starter center drill.  This allowed for [name] to use the 15 degree full wedge reamer obtaining circumferential witness marks and good bone preparation for ingrowth.  At this point we proceeded with our center drill and had an intact vault.  We then drilled our center screw to a length of 40 mm.    We selected a 6.5 mm x 40 mm screw and the full wedge baseplate which was placed in the same orientation as our reaming.  We double checked that we had good apposition of the base plate to bone and then proceeded to place 3 locking screws and one nonlocking screw as is typical.   Next a 39 mm glenosphere was selected and impacted onto the baseplate. The center screw was tightened.  We turned attention back to the humeral side. The cut protector was removed.  We used the perform humeral sizing block to select the appropriate size which for this patient was a 4.  We then placed our center pin and reamed over it concentrically obtaining appropriate inset.  We then used our lateralizing chisel to prepare the lateral aspect of the humerus.  At that point we selected the appropriate implant trialing a 4.  Using this trial implant we trialed multiple polyethylene sizes settling on a 0 which provided good stability and range of motion without excess soft  tissue tension. The offset was dialed in to match the normal anatomy. The shoulder was trialed.  There was good ROM in all planes and the shoulder was stable with no inferior translation.  The real humeral implants were opened after again confirming sizes.  The trial was removed. #5 Fiberwire x4 sutures passed through the humeral neck for subscap repair. The humeral component was press-fit obtaining a secure fit. The joint was reduced and thoroughly irrigated with pulsatile lavage. Subscap was repaired back with #5 Fiberwire sutures through bone tunnels. Hemostasis was obtained. The deltopectoral  interval was reapproximated with #1 Ethibond. The subcutaneous tissues were closed with 2-0 Vicryl and the skin was closed with running monocryl.    The wounds were cleaned and dried and an Aquacel dressing was placed. The drapes taken down. The arm was placed into sling with abduction pillow. Patient was awakened, extubated, and transferred to the recovery room in stable condition. There were no intraoperative complications. The sponge, needle, and attention counts were  correct at the end of the case.     Alfonse Alpers, PA-C, present and scrubbed throughout the case, critical for completion in a timely fashion, and for retraction, instrumentation, closure.

## 2023-10-19 NOTE — Anesthesia Preprocedure Evaluation (Signed)
Anesthesia Evaluation  Patient identified by MRN, date of birth, ID band Patient awake    Reviewed: Allergy & Precautions, NPO status , Patient's Chart, lab work & pertinent test results, reviewed documented beta blocker date and time   Airway Mallampati: II  TM Distance: >3 FB Neck ROM: Full    Dental  (+) Teeth Intact, Dental Advisory Given   Pulmonary sleep apnea and Continuous Positive Airway Pressure Ventilation , former smoker   Pulmonary exam normal breath sounds clear to auscultation       Cardiovascular hypertension, Pt. on home beta blockers and Pt. on medications + CAD and + Peripheral Vascular Disease  Normal cardiovascular exam Rhythm:Regular Rate:Normal  Echo 06/03/23: 1. Left ventricular ejection fraction, by estimation, is 60 to 65%. The  left ventricle has normal function. The left ventricle has no regional  wall motion abnormalities. Left ventricular diastolic parameters are  consistent with Grade I diastolic  dysfunction (impaired relaxation).   2. Right ventricular systolic function is normal. The right ventricular  size is normal. There is normal pulmonary artery systolic pressure.   3. The mitral valve is normal in structure. No evidence of mitral valve  regurgitation. No evidence of mitral stenosis.   4. The aortic valve is tricuspid. There is mild calcification of the  aortic valve. Aortic valve regurgitation is mild.   5. Aneurysm of the ascending aorta, measuring 42 mm.     Neuro/Psych  PSYCHIATRIC DISORDERS  Depression    negative neurological ROS     GI/Hepatic Neg liver ROS,GERD  Medicated,,  Endo/Other  Obesity   Renal/GU Renal InsufficiencyRenal disease     Musculoskeletal  (+) Arthritis ,  djd left shoulder   Abdominal   Peds  Hematology negative hematology ROS (+)   Anesthesia Other Findings Day of surgery medications reviewed with the patient.  Reproductive/Obstetrics                               Anesthesia Physical Anesthesia Plan  ASA: 3  Anesthesia Plan: General   Post-op Pain Management: Regional block* and Tylenol PO (pre-op)*   Induction: Intravenous  PONV Risk Score and Plan: 2 and Dexamethasone and Ondansetron  Airway Management Planned: Oral ETT  Additional Equipment:   Intra-op Plan:   Post-operative Plan: Extubation in OR  Informed Consent: I have reviewed the patients History and Physical, chart, labs and discussed the procedure including the risks, benefits and alternatives for the proposed anesthesia with the patient or authorized representative who has indicated his/her understanding and acceptance.     Dental advisory given  Plan Discussed with: CRNA  Anesthesia Plan Comments:          Anesthesia Quick Evaluation

## 2023-10-19 NOTE — Transfer of Care (Signed)
Immediate Anesthesia Transfer of Care Note  Patient: Joseph Maldonado.  Procedure(s) Performed: REVERSE SHOULDER ARTHROPLASTY (Left: Shoulder)  Patient Location: PACU  Anesthesia Type:General  Level of Consciousness: awake and alert   Airway & Oxygen Therapy: Patient Spontanous Breathing and Patient connected to face mask oxygen  Post-op Assessment: Report given to RN and Post -op Vital signs reviewed and stable  Post vital signs: Reviewed and stable  Last Vitals:  Vitals Value Taken Time  BP 159/74 10/19/23 1030  Temp    Pulse 59 10/19/23 1031  Resp 11 10/19/23 1031  SpO2 100 % 10/19/23 1031  Vitals shown include unfiled device data.  Last Pain:  Vitals:   10/19/23 0709  TempSrc: Oral  PainSc:          Complications: No notable events documented.

## 2023-10-19 NOTE — Interval H&P Note (Signed)
All questions answered

## 2023-10-19 NOTE — Anesthesia Procedure Notes (Signed)
Anesthesia Regional Block: Interscalene brachial plexus block   Pre-Anesthetic Checklist: , timeout performed,  Correct Patient, Correct Site, Correct Laterality,  Correct Procedure, Correct Position, site marked,  Risks and benefits discussed,  Surgical consent,  Pre-op evaluation,  At surgeon's request and post-op pain management  Laterality: Left  Prep: chloraprep       Needles:  Injection technique: Single-shot  Needle Type: Echogenic Stimulator Needle     Needle Length: 9cm  Needle Gauge: 22     Additional Needles:   Procedures:,,,, ultrasound used (permanent image in chart),,    Narrative:  Start time: 10/19/2023 8:10 AM End time: 10/19/2023 8:16 AM Injection made incrementally with aspirations every 5 mL.  Performed by: Personally  Anesthesiologist: Collene Schlichter, MD  Additional Notes: Functioning IV was confirmed and monitors were applied.  A 90mm 22ga echogenic stimulator needle was used. Sterile prep and drape, hand hygiene, and sterile gloves were used.  Negative aspiration and negative test dose prior to incremental administration of local anesthetic. The patient tolerated the procedure well.  Ultrasound guidance: relevent anatomy identified, needle position confirmed, local anesthetic spread visualized around nerve(s), vascular puncture avoided.  Image printed for medical record.

## 2023-10-19 NOTE — Anesthesia Procedure Notes (Signed)
Procedure Name: Intubation Date/Time: 10/19/2023 9:06 AM  Performed by: Jamelle Rushing, CRNAPre-anesthesia Checklist: Patient identified, Emergency Drugs available, Suction available, Patient being monitored and Timeout performed Patient Re-evaluated:Patient Re-evaluated prior to induction Oxygen Delivery Method: Circle system utilized Preoxygenation: Pre-oxygenation with 100% oxygen Induction Type: IV induction Ventilation: Mask ventilation without difficulty Laryngoscope Size: Mac and 3 Grade View: Grade I Tube type: Oral Tube size: 8.0 mm Number of attempts: 1 Airway Equipment and Method: Stylet Placement Confirmation: ETT inserted through vocal cords under direct vision, positive ETCO2 and breath sounds checked- equal and bilateral Secured at: 24 cm Tube secured with: Tape Dental Injury: Teeth and Oropharynx as per pre-operative assessment

## 2023-10-19 NOTE — Anesthesia Postprocedure Evaluation (Signed)
Anesthesia Post Note  Patient: Joseph Maldonado.  Procedure(s) Performed: REVERSE SHOULDER ARTHROPLASTY (Left: Shoulder)     Patient location during evaluation: PACU Anesthesia Type: General Level of consciousness: awake and alert Pain management: pain level controlled Vital Signs Assessment: post-procedure vital signs reviewed and stable Respiratory status: spontaneous breathing, nonlabored ventilation, respiratory function stable and patient connected to nasal cannula oxygen Cardiovascular status: blood pressure returned to baseline and stable Postop Assessment: no apparent nausea or vomiting Anesthetic complications: no   No notable events documented.  Last Vitals:  Vitals:   10/19/23 1248 10/19/23 1503  BP: 139/79 134/82  Pulse: 72 63  Resp: 15 16  Temp: 36.7 C 36.4 C  SpO2: 98% 99%    Last Pain:  Vitals:   10/19/23 1248  TempSrc: Oral  PainSc:                  Collene Schlichter

## 2023-10-20 ENCOUNTER — Other Ambulatory Visit (HOSPITAL_COMMUNITY): Payer: Self-pay

## 2023-10-20 DIAGNOSIS — M19012 Primary osteoarthritis, left shoulder: Secondary | ICD-10-CM | POA: Diagnosis not present

## 2023-10-20 DIAGNOSIS — I2510 Atherosclerotic heart disease of native coronary artery without angina pectoris: Secondary | ICD-10-CM | POA: Diagnosis not present

## 2023-10-20 DIAGNOSIS — I129 Hypertensive chronic kidney disease with stage 1 through stage 4 chronic kidney disease, or unspecified chronic kidney disease: Secondary | ICD-10-CM | POA: Diagnosis not present

## 2023-10-20 DIAGNOSIS — M75102 Unspecified rotator cuff tear or rupture of left shoulder, not specified as traumatic: Secondary | ICD-10-CM | POA: Diagnosis not present

## 2023-10-20 DIAGNOSIS — N189 Chronic kidney disease, unspecified: Secondary | ICD-10-CM | POA: Diagnosis not present

## 2023-10-20 DIAGNOSIS — Z96653 Presence of artificial knee joint, bilateral: Secondary | ICD-10-CM | POA: Diagnosis not present

## 2023-10-20 MED ORDER — ACETAMINOPHEN 500 MG PO TABS
1000.0000 mg | ORAL_TABLET | Freq: Three times a day (TID) | ORAL | 0 refills | Status: AC
Start: 2023-10-20 — End: 2023-11-03
  Filled 2023-10-20: qty 84, 14d supply, fill #0

## 2023-10-20 MED ORDER — METHOCARBAMOL 500 MG PO TABS
500.0000 mg | ORAL_TABLET | Freq: Three times a day (TID) | ORAL | 0 refills | Status: AC | PRN
  Filled 2023-10-20: qty 30, 10d supply, fill #0

## 2023-10-20 MED ORDER — ASPIRIN 81 MG PO CHEW
81.0000 mg | CHEWABLE_TABLET | Freq: Two times a day (BID) | ORAL | 0 refills | Status: AC
Start: 2023-10-20 — End: 2023-12-01
  Filled 2023-10-20: qty 84, 42d supply, fill #0

## 2023-10-20 MED ORDER — OXYCODONE HCL 5 MG PO TABS
ORAL_TABLET | ORAL | 0 refills | Status: AC
  Filled 2023-10-20: qty 30, 3d supply, fill #0

## 2023-10-20 MED ORDER — ONDANSETRON HCL 4 MG PO TABS
4.0000 mg | ORAL_TABLET | Freq: Three times a day (TID) | ORAL | 0 refills | Status: AC | PRN
  Filled 2023-10-20: qty 10, 4d supply, fill #0

## 2023-10-20 NOTE — Evaluation (Signed)
Occupational Therapy Evaluation Patient Details Name: Gerold Weigman. MRN: [id number] DOB: September 02, 1946 Today's Date: 10/20/2023   History of Present Illness 78 yo male s/p LT reverse Total Shoulder Arthoplasty on 10/19/23.  PMH includes SCC, R RTC arthropathy, R TKR with multiple revisions, L TKR, L THA, R DA-THA on 08/14/18, CKD, OA, depression   Clinical Impression   Pt is a 78 year old male, s/p shoulder replacement without functional use of LT non-dominant upper extremity secondary to effects of surgery and interscalene block and shoulder precautions. Therapist provided education and instruction to patient and spouse in regards to precautions, positioning, donning upper extremity clothing and bathing while maintaining shoulder precautions, ice and edema management and donning/doffing sling.   Increased time needed for sling position due to pt/spouse had brought two slings with parts mixed up.  At end of long trails, sling looking correctly in place and pt claims comfort. Patient and spouse verbalized understanding and demonstrated as needed. Patient needed assistance to donn shirt, underwear, pants, socks and shoes and provided with instruction on compensatory strategies to perform ADLs. Patient limited by decreased ROM in LT shoulder so therefore will need some form of assistance at home. Patient and spouse verbalized and/or demonstrated understanding to all instruction. Patient to follow up with MD for further therapy needs.         If plan is discharge home, recommend the following: A little help with bathing/dressing/bathroom;Assistance with cooking/housework;Assist for transportation    Functional Status Assessment  Patient has had a recent decline in their functional status and demonstrates the ability to make significant improvements in function in a reasonable and predictable amount of time.  Equipment Recommendations  None recommended by OT    Recommendations for Other Services        Precautions / Restrictions Precautions Precautions: Shoulder Shoulder Interventions: Shoulder sling/immobilizer;Don joy ultra sling;At all times;Off for dressing/bathing/exercises Precaution Booklet Issued: Yes (comment) (Cleared to do so by Ortho PA) Required Braces or Orthoses: Sling Restrictions Weight Bearing Restrictions Per Provider Order: Yes LUE Weight Bearing Per Provider Order: Non weight bearing      Mobility Bed Mobility               General bed mobility comments: Pt already sitting EOB    Transfers Overall transfer level: Needs assistance   Transfers: Sit to/from Stand, Bed to chair/wheelchair/BSC Sit to Stand: Supervision Stand pivot transfers: Supervision, Contact guard assist                Balance Overall balance assessment: Mild deficits observed, not formally tested                                         ADL either performed or assessed with clinical judgement   ADL Overall ADL's : Needs assistance/impaired Eating/Feeding: Set up;Sitting   Grooming: Set up;Sitting   Upper Body Bathing: Minimal assistance;Cueing for compensatory techniques;Cueing for UE precautions;Sitting;With caregiver independent assisting;Adhering to UE precautions   Lower Body Bathing: Contact guard assist;Sitting/lateral leans;Minimal assistance;With caregiver independent assisting;Cueing for compensatory techniques   Upper Body Dressing : Minimal assistance;Cueing for compensatory techniques;Cueing for UE precautions;Sitting;With caregiver independent assisting;Adhering to UE precautions   Lower Body Dressing: Minimal assistance;Cueing for compensatory techniques;Sitting/lateral leans;Sit to/from stand   Toilet Transfer: Supervision/safety   Toileting- Architect and Hygiene: Moderate assistance Toileting - Clothing Manipulation Details (indicate cue type and reason): Pt and spouse  were educated on adaptive options to assist with  hygiene.     Functional mobility during ADLs: Supervision/safety;Contact guard assist       Vision Baseline Vision/History: 1 Wears glasses Patient Visual Report: No change from baseline Vision Assessment?: No apparent visual deficits     Perception         Praxis         Pertinent Vitals/Pain Pain Assessment Pain Assessment: No/denies pain     Extremity/Trunk Assessment Upper Extremity Assessment Upper Extremity Assessment: Right hand dominant;RUE deficits/detail;LUE deficits/detail RUE Deficits / Details: Limited IR for hygiene since previous surgery. Otherwise WFL LUE Deficits / Details: Able to open and close hands. Beginning to try movement and spouse good about cuing pt to stop. LUE: Unable to fully assess due to immobilization           Communication     Cognition                                             General Comments       Exercises Other Exercises Other Exercises: No HEP given as pt starting outpatient PT tomorrow and order set for OT did not include. Pt is using his LT hand well.   Shoulder Instructions Shoulder Instructions Donning/doffing shirt without moving shoulder: Minimal assistance;Caregiver independent with task;Patient able to independently direct caregiver Method for sponge bathing under operated UE: Minimal assistance;Caregiver independent with task;Patient able to independently direct caregiver Donning/doffing sling/immobilizer: Minimal assistance;Caregiver independent with task;Patient able to independently direct caregiver (Increased time for this as pt brought mixed up sling parts to the hospital and 30 min was needed to suss this out.  It is now donned correctly) Correct positioning of sling/immobilizer: Patient able to independently direct caregiver;Caregiver independent with task;Minimal assistance Sling wearing schedule (on at all times/off for ADL's): Independent;Patient able to independently direct  caregiver;Caregiver independent with task Proper positioning of operated UE when showering: Supervision/safety;Caregiver independent with task;Patient able to independently direct caregiver Positioning of UE while sleeping: Set-up;Caregiver independent with task;Patient able to independently direct caregiver    Home Living Family/patient expects to be discharged to:: Private residence Living Arrangements: Spouse/significant other Available Help at Discharge: Family;Friend(s);Available 24 hours/day Type of Home: House Home Access: Stairs to enter [employer] of Steps: 1   Home Layout: Able to live on main level with bedroom/bathroom;1/2 bath on main level     Bathroom Shower/Tub: Arts development officer Toilet: Handicapped height     Home Equipment: Cane - single Librarian, academic (2 wheels);Toilet riser          Prior Functioning/Environment Prior Level of Function : Independent/Modified Independent                        OT Problem List: Decreased range of motion;Decreased strength;Impaired UE functional use      OT Treatment/Interventions:      OT Goals(Current goals can be found in the care plan section) Acute Rehab OT Goals OT Goal Formulation: All assessment and education complete, DC therapy ADL Goals Additional ADL Goal #1: Pt and/or spouse will demonstrate UE/LE dressing, donning/doffing of sling, correct positioning of LT UE, and compensatory strategies for LT axilla hygiene, as well as use of Ice man cryo cuff, while correctly following all shoulder post-op precautions/restrictions.  OT Frequency:      Co-evaluation  AM-PAC OT "6 Clicks" Daily Activity     Outcome Measure Help from another person eating meals?: A Little Help from another person taking care of personal grooming?: A Little Help from another person toileting, which includes using toliet, bedpan, or urinal?: A Little Help from another person bathing  (including washing, rinsing, drying)?: A Little Help from another person to put on and taking off regular upper body clothing?: A Little Help from another person to put on and taking off regular lower body clothing?: A Little 6 Click Score: 18   End of Session Nurse Communication:  (OT completed)  Activity Tolerance: Patient tolerated treatment well Patient left: with call bell/phone within reach;with family/visitor present (EOB)  OT Visit Diagnosis: Muscle weakness (generalized) (M62.81)                Time: 0981-1914 OT Time Calculation (min): 57 min Charges:  OT General Charges $OT Visit: 1 Visit OT Evaluation $OT Eval Low Complexity: 1 Low OT Treatments $Self Care/Home Management : 23-37 mins $Therapeutic Activity: 8-22 mins  Victorino Dike, OT Acute Rehab Services Office: [phone] 10/20/2023   Theodoro Clock 10/20/2023, 1:17 PM

## 2023-10-20 NOTE — Care Management Obs Status (Signed)
MEDICARE OBSERVATION STATUS NOTIFICATION   Patient Details  Name: Joseph Maldonado. MRN: [id number] Date of Birth: 08/03/1946   Medicare Observation Status Notification Given:  Yes    Amada Jupiter, LCSW 10/20/2023, 11:29 AM

## 2023-10-20 NOTE — Plan of Care (Signed)
  Problem: Coping: Goal: Level of anxiety will decrease Outcome: Progressing   Problem: Pain Managment: Goal: General experience of comfort will improve and/or be controlled Outcome: Progressing   Problem: Safety: Goal: Ability to remain free from injury will improve Outcome: Progressing

## 2023-10-20 NOTE — TOC Transition Note (Signed)
Transition of Care [facility]) - Discharge Note   Patient Details  Name: Joseph Maldonado. MRN: [id number] Date of Birth: 18-Nov-1945  [facility]) CM/SW Contact:  Amada Jupiter, LCSW Phone Number: 10/20/2023, 11:29 AM   Clinical Narrative:     Met with pt who confirms no DME needs and OP therapy already arranged.  No TOC needs.  Final next level of care: OP Rehab Barriers to Discharge: No Barriers Identified   Patient Goals and CMS Choice Patient states their goals for this hospitalization and ongoing recovery are:: return home          Discharge Placement                       Discharge Plan and Services Additional resources added to the After Visit Summary for                  DME Arranged: N/A DME Agency: NA                  Social Drivers of Health (SDOH) Interventions SDOH Screenings   Food Insecurity: No Food Insecurity (10/19/2023)  Housing: Low Risk  (10/19/2023)  Transportation Needs: No Transportation Needs (10/19/2023)  Utilities: Not At Risk (10/19/2023)  Alcohol Screen: Low Risk  (08/28/2020)  Depression (PHQ2-9): Low Risk  (02/26/2021)  Financial Resource Strain: Low Risk  (08/08/2017)  Physical Activity: Inactive (08/08/2017)  Social Connections: Socially Integrated (10/19/2023)  Stress: No Stress Concern Present (08/08/2017)  Tobacco Use: Medium Risk (10/19/2023)     Readmission Risk Interventions     No data to display

## 2023-10-20 NOTE — Progress Notes (Signed)
   ORTHOPAEDIC PROGRESS NOTE  s/p Procedure(s): REVERSE SHOULDER ARTHROPLASTY  SUBJECTIVE: Reports mild pain about operative site. Nerve block still working.  No chest pain. No SOB. No nausea/vomiting. No other complaints.  OBJECTIVE: PE: General: resting in hospital bed, NAD LUE: Dressing CDI and sling well fitting.  Axillary nerve sensation/motor altered in setting of block and unable to be fully tested.  Distal motor and sensory altered in setting of block.   Vitals:   10/20/23 0124 10/20/23 0525  BP: 106/70 (!) 103/55  Pulse: (!) 55 (!) 54  Resp: 17 16  Temp:  97.6 F (36.4 C)  SpO2: 99% 100%   Stable post-op images  ASSESSMENT: Joseph Maldonado. is a 78 y.o. male doing well postoperatively. POD#1  PLAN: Weightbearing: NWB LUE Insicional and dressing care: Reinforce dressings as needed Orthopedic device(s): Sling Showering: post-op day #2 VTE prophylaxis: Aspirin 81 mg BID Pain control: PRN pain medications, minimize narcotics as able Follow - up plan: 1 week in office Dispo:  Plan to discharge home today Contact information:  Weekdays 8-5 Dr. Ramond Marrow, Alfonse Alpers PA-C, After hours and holidays please check [email or web address] for group call information for [facility]  Alfonse Alpers, PA-C 10/20/23

## 2023-10-20 NOTE — Discharge Summary (Signed)
Patient ID: Joseph Maldonado. MRN: [id number] DOB/AGE: [birth year] 78 y.o.  Admit date: 10/19/2023 Discharge date: 10/20/2023  Admission Diagnoses:Left shoulder end stage glenohumeral arthritis with irreparable rotator cuff tear   Discharge Diagnoses:  Principal Problem:   Status post reverse total replacement of left shoulder   Past Medical History:  Diagnosis Date   Arthritis    Ascending aortic aneurysm (HCC)    CAD (coronary artery disease)    Chronic kidney disease    Depression    Dysrhythmia    occasional Palps   GERD (gastroesophageal reflux disease)    Hand pain    currently being evaluated by rheumatology for dx    Hematuria    Hypertension    Rapid heart beat    rpeorts  " 2 months ago i had a rapid heart beat that lasted 45 min to an hour, ive experience this twice in the last t10 years but only seconds long" " i was evaluated by cardiology Dr Marilynne Halsted who did a 30 day monitor and ECHO and said i have occasional [PATS] ? , denies chest pain nor LOC  during these episodes; HR today is WDL    Sleep apnea    CPAP      Procedures Performed: Left reverse augmented total Shoulder Arthroplasty   Discharged Condition: stable  Hospital Course: Patient brought in as an outpatient for surgery.  He tolerated procedure well.  Was kept for monitoring overnight for pain control and medical monitoring postop and was found to be stable for DC home the morning after surgery.  Patient was instructed on specific activity restrictions and all questions were answered.   Consults: None  Significant Diagnostic Studies: No additional pertinent studies  Treatments: Surgery  Discharge Exam: General: resting in hospital bed, NAD LUE: Dressing CDI and sling well fitting.  Axillary nerve sensation/motor altered in setting of block and unable to be fully tested.  Distal motor and sensory altered in setting of block.  Disposition: Discharge disposition: 01-Home or Self  Care       Discharge Instructions     Call MD for:  redness, tenderness, or signs of infection (pain, swelling, redness, odor or green/yellow discharge around incision site)   Complete by: As directed    Call MD for:  severe uncontrolled pain   Complete by: As directed    Call MD for:  temperature >100.4   Complete by: As directed    Diet - low sodium heart healthy   Complete by: As directed       Allergies as of 10/20/2023       Reactions   Nsaids    Affects creatinine   Dilaudid [hydromorphone Hcl] Other (See Comments)   Suicidal   Sulfa Antibiotics    Other reaction(s): GI Intolerance        Medication List     TAKE these medications    acetaminophen 500 MG tablet Commonly known as: TYLENOL Take 2 tablets (1,000 mg total) by mouth every 8 (eight) hours for 14 days. What changed: when to take this   acetic acid 2 % otic solution Place 5 drops into the affected ear twice daily.   aspirin 81 MG chewable tablet Commonly known as: Aspirin Childrens Chew 1 tablet (81 mg total) by mouth 2 (two) times daily. For 6 weeks for DVT prophylaxis after surgery   atenolol 25 MG tablet Commonly known as: TENORMIN Take 1 tablet (25 mg total) by mouth daily What changed: when to take this  atorvastatin 20 MG tablet Commonly known as: LIPITOR Take 1 tablet (20 mg total) by mouth daily. What changed: when to take this   buPROPion ER 100 MG 12 hr tablet Commonly known as: WELLBUTRIN SR Take 1 tablet by mouth once daily   buPROPion ER 100 MG 12 hr tablet Commonly known as: Wellbutrin SR Take 1 tablet (100 mg total) by mouth 2 (two) times daily for 30 days.   cholecalciferol 25 MCG (1000 UNIT) tablet Commonly known as: VITAMIN D3 Take 1,000 Units by mouth in the morning.   losartan 100 MG tablet Commonly known as: COZAAR Take 1 tablet (100 mg) by mouth daily What changed: when to take this   methocarbamol 500 MG tablet Commonly known as: ROBAXIN Take 1 tablet  (500 mg total) by mouth every 8 (eight) hours as needed for muscle spasms.   methylPREDNISolone 4 MG Tbpk tablet Commonly known as: Medrol Take as prescribed on dose pack over 6 days   multivitamin with minerals Tabs tablet Take 1 tablet by mouth every evening.   mupirocin ointment 2 % Commonly known as: BACTROBAN Apply a small amount twice a day intranasally for 14 days.   ondansetron 4 MG tablet Commonly known as: Zofran Take 1 tablet (4 mg total) by mouth every 8 (eight) hours as needed for up to 7 days for nausea or vomiting.   oxyCODONE 5 MG immediate release tablet Commonly known as: Oxy IR/ROXICODONE Take 1-2 pills every 6 hrs as needed for severe pain, no more than 6 per day   pantoprazole 20 MG tablet Commonly known as: PROTONIX Take 1 tablet (20 mg total) by mouth daily.   sertraline 50 MG tablet Commonly known as: ZOLOFT Take 0.5 tablets (25 mg total) by mouth daily for 14 days, THEN 1 tablet (50 mg total) daily . Start taking on: September 19, 2023   sertraline 50 MG tablet Commonly known as: ZOLOFT Take 1 tablet (50 mg total) by mouth daily.   sodium bicarbonate 650 MG tablet Take 1 tablet (650 mg total) by mouth 2 (two) times daily.   tizanidine 2 MG capsule Commonly known as: ZANAFLEX Take 2 mg by mouth at bedtime as needed (stiffness/sleep.).        Follow-up Information     Bjorn Pippin, MD. Go on 10/28/2023.   Specialty: Orthopedic Surgery Why: your appointment is scheduled for 10:00 Contact information: [address] [place] [address] [phone]         [facility], [place]. Go on 10/21/2023.   Why: Your outpatient phyical therapy is scheduled for 9:30. Please arrive at 9:15 to complete paperwork Contact information: [facility] [address] [place] [address] [phone]                  Alfonse Alpers, PA-C 10/20/23

## 2023-10-20 NOTE — Progress Notes (Signed)
TOC meds in a secure bag delivered to the bedside by this RN. Pt dressed for d/c - primary nurse updated via secure chat by this RN.

## 2023-10-21 ENCOUNTER — Encounter (HOSPITAL_COMMUNITY): Payer: Self-pay | Admitting: Orthopaedic Surgery

## 2023-10-21 DIAGNOSIS — M25612 Stiffness of left shoulder, not elsewhere classified: Secondary | ICD-10-CM | POA: Diagnosis not present

## 2023-10-21 DIAGNOSIS — M6281 Muscle weakness (generalized): Secondary | ICD-10-CM | POA: Diagnosis not present

## 2023-10-21 DIAGNOSIS — M19012 Primary osteoarthritis, left shoulder: Secondary | ICD-10-CM | POA: Diagnosis not present

## 2023-10-22 ENCOUNTER — Other Ambulatory Visit: Payer: Self-pay | Admitting: Physician Assistant

## 2023-10-22 ENCOUNTER — Other Ambulatory Visit (HOSPITAL_COMMUNITY): Payer: Self-pay

## 2023-10-22 DIAGNOSIS — Z96612 Presence of left artificial shoulder joint: Secondary | ICD-10-CM

## 2023-10-22 MED ORDER — GABAPENTIN 100 MG PO CAPS
100.0000 mg | ORAL_CAPSULE | Freq: Three times a day (TID) | ORAL | 0 refills | Status: AC
  Filled 2023-10-22: qty 90, 30d supply, fill #0

## 2023-10-22 NOTE — Progress Notes (Signed)
Patient called the after hours line today regarding severe left hand pain after surgery. His shoulder feels fine. He had some swelling in the left hand but this has resolved. He has tried oxycodone and robaxin without any improvement. I will send him a prescription for gabapentin. Discussed elbow, wrist, hand exercises. His wife is a Engineer, civil (consulting) and states patient has good pulses, normal temperature, brisk cap refill. We will continue to monitor closely. Likely will improve with exercises. They will call me with any further issues.  Alfonse Alpers, PA-C 10/22/2023

## 2023-10-25 DIAGNOSIS — M6281 Muscle weakness (generalized): Secondary | ICD-10-CM | POA: Diagnosis not present

## 2023-10-25 DIAGNOSIS — M19012 Primary osteoarthritis, left shoulder: Secondary | ICD-10-CM | POA: Diagnosis not present

## 2023-10-25 DIAGNOSIS — M25612 Stiffness of left shoulder, not elsewhere classified: Secondary | ICD-10-CM | POA: Diagnosis not present

## 2023-10-27 DIAGNOSIS — M6281 Muscle weakness (generalized): Secondary | ICD-10-CM | POA: Diagnosis not present

## 2023-10-27 DIAGNOSIS — M19012 Primary osteoarthritis, left shoulder: Secondary | ICD-10-CM | POA: Diagnosis not present

## 2023-10-27 DIAGNOSIS — M25612 Stiffness of left shoulder, not elsewhere classified: Secondary | ICD-10-CM | POA: Diagnosis not present

## 2023-10-28 DIAGNOSIS — M19012 Primary osteoarthritis, left shoulder: Secondary | ICD-10-CM | POA: Diagnosis not present

## 2023-10-28 DIAGNOSIS — D649 Anemia, unspecified: Secondary | ICD-10-CM | POA: Diagnosis not present

## 2023-10-28 DIAGNOSIS — R5383 Other fatigue: Secondary | ICD-10-CM | POA: Diagnosis not present

## 2023-11-01 DIAGNOSIS — M19012 Primary osteoarthritis, left shoulder: Secondary | ICD-10-CM | POA: Diagnosis not present

## 2023-11-01 DIAGNOSIS — M6281 Muscle weakness (generalized): Secondary | ICD-10-CM | POA: Diagnosis not present

## 2023-11-01 DIAGNOSIS — M25612 Stiffness of left shoulder, not elsewhere classified: Secondary | ICD-10-CM | POA: Diagnosis not present

## 2023-11-04 DIAGNOSIS — M19012 Primary osteoarthritis, left shoulder: Secondary | ICD-10-CM | POA: Diagnosis not present

## 2023-11-04 DIAGNOSIS — M6281 Muscle weakness (generalized): Secondary | ICD-10-CM | POA: Diagnosis not present

## 2023-11-04 DIAGNOSIS — M25612 Stiffness of left shoulder, not elsewhere classified: Secondary | ICD-10-CM | POA: Diagnosis not present

## 2023-11-07 ENCOUNTER — Other Ambulatory Visit (HOSPITAL_COMMUNITY): Payer: Self-pay

## 2023-11-07 DIAGNOSIS — M6281 Muscle weakness (generalized): Secondary | ICD-10-CM | POA: Diagnosis not present

## 2023-11-07 DIAGNOSIS — M19012 Primary osteoarthritis, left shoulder: Secondary | ICD-10-CM | POA: Diagnosis not present

## 2023-11-07 DIAGNOSIS — M25612 Stiffness of left shoulder, not elsewhere classified: Secondary | ICD-10-CM | POA: Diagnosis not present

## 2023-11-11 ENCOUNTER — Other Ambulatory Visit (HOSPITAL_COMMUNITY): Payer: Self-pay

## 2023-11-11 DIAGNOSIS — M19012 Primary osteoarthritis, left shoulder: Secondary | ICD-10-CM | POA: Diagnosis not present

## 2023-11-11 DIAGNOSIS — M6281 Muscle weakness (generalized): Secondary | ICD-10-CM | POA: Diagnosis not present

## 2023-11-11 DIAGNOSIS — M25612 Stiffness of left shoulder, not elsewhere classified: Secondary | ICD-10-CM | POA: Diagnosis not present

## 2023-11-13 ENCOUNTER — Encounter (HOSPITAL_BASED_OUTPATIENT_CLINIC_OR_DEPARTMENT_OTHER): Payer: Self-pay | Admitting: Radiology

## 2023-11-13 ENCOUNTER — Emergency Department (HOSPITAL_BASED_OUTPATIENT_CLINIC_OR_DEPARTMENT_OTHER): Payer: Medicare Other

## 2023-11-13 ENCOUNTER — Other Ambulatory Visit: Payer: Self-pay

## 2023-11-13 ENCOUNTER — Emergency Department (HOSPITAL_BASED_OUTPATIENT_CLINIC_OR_DEPARTMENT_OTHER)
Admission: EM | Admit: 2023-11-13 | Discharge: 2023-11-13 | Disposition: A | Discharge status: Home / Self Care | Payer: Medicare Other | Attending: Emergency Medicine | Admitting: Emergency Medicine

## 2023-11-13 DIAGNOSIS — R109 Unspecified abdominal pain: Secondary | ICD-10-CM | POA: Diagnosis present

## 2023-11-13 DIAGNOSIS — Z87891 Personal history of nicotine dependence: Secondary | ICD-10-CM | POA: Diagnosis not present

## 2023-11-13 DIAGNOSIS — Z96651 Presence of right artificial knee joint: Secondary | ICD-10-CM | POA: Diagnosis not present

## 2023-11-13 DIAGNOSIS — Z7982 Long term (current) use of aspirin: Secondary | ICD-10-CM | POA: Insufficient documentation

## 2023-11-13 DIAGNOSIS — I2510 Atherosclerotic heart disease of native coronary artery without angina pectoris: Secondary | ICD-10-CM | POA: Insufficient documentation

## 2023-11-13 DIAGNOSIS — S80211A Abrasion, right knee, initial encounter: Secondary | ICD-10-CM | POA: Insufficient documentation

## 2023-11-13 DIAGNOSIS — I129 Hypertensive chronic kidney disease with stage 1 through stage 4 chronic kidney disease, or unspecified chronic kidney disease: Secondary | ICD-10-CM | POA: Insufficient documentation

## 2023-11-13 DIAGNOSIS — W1830XA Fall on same level, unspecified, initial encounter: Secondary | ICD-10-CM | POA: Insufficient documentation

## 2023-11-13 DIAGNOSIS — M25561 Pain in right knee: Secondary | ICD-10-CM | POA: Diagnosis not present

## 2023-11-13 DIAGNOSIS — Z96641 Presence of right artificial hip joint: Secondary | ICD-10-CM | POA: Diagnosis not present

## 2023-11-13 DIAGNOSIS — D649 Anemia, unspecified: Secondary | ICD-10-CM | POA: Insufficient documentation

## 2023-11-13 DIAGNOSIS — Z79899 Other long term (current) drug therapy: Secondary | ICD-10-CM | POA: Diagnosis not present

## 2023-11-13 DIAGNOSIS — N189 Chronic kidney disease, unspecified: Secondary | ICD-10-CM | POA: Diagnosis not present

## 2023-11-13 DIAGNOSIS — K7689 Other specified diseases of liver: Secondary | ICD-10-CM | POA: Diagnosis not present

## 2023-11-13 DIAGNOSIS — T148XXA Other injury of unspecified body region, initial encounter: Secondary | ICD-10-CM

## 2023-11-13 DIAGNOSIS — S301XXA Contusion of abdominal wall, initial encounter: Secondary | ICD-10-CM | POA: Insufficient documentation

## 2023-11-13 DIAGNOSIS — N281 Cyst of kidney, acquired: Secondary | ICD-10-CM | POA: Diagnosis not present

## 2023-11-13 DIAGNOSIS — S3991XA Unspecified injury of abdomen, initial encounter: Secondary | ICD-10-CM | POA: Diagnosis not present

## 2023-11-13 DIAGNOSIS — Y9301 Activity, walking, marching and hiking: Secondary | ICD-10-CM | POA: Diagnosis not present

## 2023-11-13 LAB — CBC WITH DIFFERENTIAL/PLATELET
Abs Immature Granulocytes: 0.03 10*3/uL (ref 0.00–0.07)
Basophils Absolute: 0.1 10*3/uL (ref 0.0–0.1)
Basophils Relative: 1 %
Eosinophils Absolute: 0.4 10*3/uL (ref 0.0–0.5)
Eosinophils Relative: 5 %
HCT: 37.7 % — ABNORMAL LOW (ref 39.0–52.0)
Hemoglobin: 12.1 g/dL — ABNORMAL LOW (ref 13.0–17.0)
Immature Granulocytes: 0 %
Lymphocytes Relative: 23 %
Lymphs Abs: 1.8 10*3/uL (ref 0.7–4.0)
MCH: 30.7 pg (ref 26.0–34.0)
MCHC: 32.1 g/dL (ref 30.0–36.0)
MCV: 95.7 fL (ref 80.0–100.0)
Monocytes Absolute: 0.7 10*3/uL (ref 0.1–1.0)
Monocytes Relative: 9 %
Neutro Abs: 4.7 10*3/uL (ref 1.7–7.7)
Neutrophils Relative %: 62 %
Platelets: 196 10*3/uL (ref 150–400)
RBC: 3.94 MIL/uL — ABNORMAL LOW (ref 4.22–5.81)
RDW: 13.9 % (ref 11.5–15.5)
WBC: 7.7 10*3/uL (ref 4.0–10.5)
nRBC: 0 % (ref 0.0–0.2)

## 2023-11-13 LAB — BASIC METABOLIC PANEL
Anion gap: 8 (ref 5–15)
BUN: 28 mg/dL — ABNORMAL HIGH (ref 8–23)
CO2: 27 mmol/L (ref 22–32)
Calcium: 9.8 mg/dL (ref 8.9–10.3)
Chloride: 106 mmol/L (ref 98–111)
Creatinine, Ser: 1.37 mg/dL — ABNORMAL HIGH (ref 0.61–1.24)
GFR, Estimated: 53 mL/min — ABNORMAL LOW (ref >60)
Glucose, Bld: 80 mg/dL (ref 70–99)
Potassium: 4.1 mmol/L (ref 3.5–5.1)
Sodium: 141 mmol/L (ref 135–145)

## 2023-11-13 MED ORDER — IOHEXOL 300 MG/ML  SOLN
100.0000 mL | Freq: Once | INTRAMUSCULAR | Status: AC | PRN
  Administered 2023-11-13: 100 mL via INTRAVENOUS

## 2023-11-13 NOTE — ED Triage Notes (Signed)
 Out walking dog and tree fell spooking dog. Dog ran and they held on to the dog leash and the dogs drug them down. Pt wife started to fall and he attempted to catch her. Pt is 3 weeks out of a shoulder replacement surgery on the left side. Pt fell on the right side. Pt has a large hematoma to this right side. On a scale of 0-10 pt rate pain a 3. Pt did not injure his left shoulder.

## 2023-11-13 NOTE — Discharge Instructions (Addendum)
 We evaluated you for your fall.  Your CT scan shows hematoma, a collection of blood.  We did not see any active bleeding.  Over time, this should reabsorb on its own.  You can apply ice for any pain or swelling.  You can also take at 1000 mg of Tylenol every 6 hours as needed for pain.  Please keep an eye on the area, if you develop any increased swelling or pain, redness, fevers or chills, lightheadedness or dizziness, or any new or worsening symptoms, please return to the emergency department.

## 2023-11-13 NOTE — ED Provider Notes (Signed)
 [place] [facility]ERGENCY DEPARTMENT AT [facility] Provider Note  CSN: [id number] Arrival date & time: 11/13/23 1803  Chief Complaint(s) Fall  HPI Joseph Maldonado. is a 78 y.o. male history of CKD, hypertension presenting to the emergency department after fall.  Patient was walking with his wife with the dog.  Wife was holding a leash, dog was spooked, pulled wife down.  He tried to help with his wife and he fell on his right side.  Reports some minimal right knee pain, right abdominal hematoma.  Denies any head injury did have recent left shoulder surgery and is in a sling but denies any pain or discomfort to the left shoulder.  Denies any pain to his right upper extremity, left lower extremity, neck, back, chest, head, does have some minimal abdominal pain but no nausea or vomiting.  Denies any anticoagulant use   Past Medical History Past Medical History:  Diagnosis Date   Arthritis    Ascending aortic aneurysm (HCC)    CAD (coronary artery disease)    Chronic kidney disease    Depression    Dysrhythmia    occasional Palps   GERD (gastroesophageal reflux disease)    Hand pain    currently being evaluated by rheumatology for dx    Hematuria    Hypertension    Rapid heart beat    rpeorts  " 2 months ago i had a rapid heart beat that lasted 45 min to an hour, ive experience this twice in the last t10 years but only seconds long" " i was evaluated by cardiology Dr Marilynne Halsted who did a 30 day monitor and ECHO and said i have occasional [PATS] ? , denies chest pain nor LOC  during these episodes; HR today is WDL    Sleep apnea    CPAP    Patient Active Problem List   Diagnosis Date Noted   Status post reverse total replacement of left shoulder 10/19/2023   Status post reverse total arthroplasty of right shoulder 01/20/2023   OSA (obstructive sleep apnea) 06/01/2019   Aneurysm of ascending aorta (HCC) 05/18/2019   Coronary artery disease involving native coronary artery of  native heart without angina pectoris 05/18/2019   Essential hypertension 05/18/2019   History of total hip arthroplasty, right 08/14/2018   Osteoarthritis of right hip 08/12/2018   Palpitations 01/29/2018   SCC (squamous cell carcinoma) 01/28/2018   Solitary pulmonary nodule 10/03/2016   Cough 09/29/2016   Seborrheic keratosis 02/26/2014   Rotator cuff tear arthropathy of right shoulder 01/10/2014   Knee joint replacement by other means 11/14/2012   S/P total knee arthroplasty 10/05/2011   Hip joint replacement by other means 03/01/2011   OA (osteoarthritis) of knee 03/01/2011   CKD (chronic kidney disease) 05/13/2010   Home Medication(s) Prior to Admission medications   Medication Sig Start Date End Date Taking? Authorizing Provider  acetic acid 2 % otic solution Place 5 drops into the affected ear twice daily. 10/14/23     aspirin (ASPIRIN CHILDRENS) 81 MG chewable tablet Chew 1 tablet (81 mg total) by mouth 2 (two) times daily. For 6 weeks for DVT prophylaxis after surgery 10/20/23 12/01/23  Vernetta Honey, PA-C  atenolol (TENORMIN) 25 MG tablet Take 1 tablet (25 mg total) by mouth daily Patient taking differently: Take 25 mg by mouth every evening. 03/24/23     atorvastatin (LIPITOR) 20 MG tablet Take 1 tablet (20 mg total) by mouth daily. Patient taking differently: Take 20 mg by mouth every  evening. 05/31/23     buPROPion ER (WELLBUTRIN SR) 100 MG 12 hr tablet Take 1 tablet by mouth once daily Patient not taking: Reported on 10/06/2023 03/15/23     buPROPion ER (WELLBUTRIN SR) 100 MG 12 hr tablet Take 1 tablet (100 mg total) by mouth 2 (two) times daily for 30 days. 03/24/23     cholecalciferol (VITAMIN D3) 25 MCG (1000 UNIT) tablet Take 1,000 Units by mouth in the morning.    [provider]  gabapentin (NEURONTIN) 100 MG capsule Take 1 capsule (100 mg total) by mouth 3 (three) times daily. For pain 10/22/23   Vernetta Honey, PA-C  losartan (COZAAR) 100 MG tablet Take 1  tablet (100 mg) by mouth daily Patient taking differently: Take 100 mg by mouth every evening. 03/24/23     methocarbamol (ROBAXIN) 500 MG tablet Take 1 tablet (500 mg total) by mouth every 8 (eight) hours as needed for muscle spasms. 10/20/23   Vernetta Honey, PA-C  methylPREDNISolone (MEDROL) 4 MG TBPK tablet Take as prescribed on dose pack over 6 days 07/17/23     Multiple Vitamin (MULTIVITAMIN WITH MINERALS) TABS tablet Take 1 tablet by mouth every evening.    [provider]  mupirocin ointment (BACTROBAN) 2 % Apply a small amount twice a day intranasally for 14 days. 10/10/23     pantoprazole (PROTONIX) 20 MG tablet Take 1 tablet (20 mg total) by mouth daily. 11/23/22     sertraline (ZOLOFT) 50 MG tablet Take 0.5 tablets (25 mg total) by mouth daily for 14 days, THEN 1 tablet (50 mg total) daily . 09/19/23 10/26/23    sertraline (ZOLOFT) 50 MG tablet Take 1 tablet (50 mg total) by mouth daily. 10/12/23     sodium bicarbonate 650 MG tablet Take 1 tablet (650 mg total) by mouth 2 (two) times daily. 09/19/23     tizanidine (ZANAFLEX) 2 MG capsule Take 2 mg by mouth at bedtime as needed (stiffness/sleep.).    [provider]                                                                                                                                    Past Surgical History Past Surgical History:  Procedure Laterality Date   CATARACT EXTRACTION, BILATERAL     HIP SURGERY Left 2012   JOINT REPLACEMENT     REPLACEMENT TOTAL KNEE Right    2001,2006,2009,2013   REPLACEMENT TOTAL KNEE Left 2011   REVERSE SHOULDER ARTHROPLASTY Right 01/20/2023   Procedure: REVERSE SHOULDER ARTHROPLASTY;  Surgeon: Bjorn Pippin, MD;  Location: [facility];  Service: Orthopedics;  Laterality: Right;   REVERSE SHOULDER ARTHROPLASTY Left 10/19/2023   Procedure: REVERSE SHOULDER ARTHROPLASTY;  Surgeon: Bjorn Pippin, MD;  Location: [facility];  Service: Orthopedics;  Laterality: Left;   TOTAL  HIP ARTHROPLASTY Right 08/14/2018   Procedure: TOTAL HIP ARTHROPLASTY ANTERIOR APPROACH;  Surgeon: Turner Daniels,  Homero Fellers, MD;  Location: [facility];  Service: Orthopedics;  Laterality: Right;   Family History Family History  Problem Relation Age of Onset   Cancer Mother        throat   Hypertension Mother    Heart disease Mother    Emphysema Mother    Hypertension Father    Cancer Father        Brain   Hypertension Sister    Hematuria Sister    Hematuria Brother    Alzheimer's disease Neg Hx    Dementia Neg Hx     Social History Social History   Tobacco Use   Smoking status: Former    Current packs/day: 0.00    Average packs/day: 0.5 packs/day for 3.0 years (1.5 ttl pk-yrs)    Types: Cigarettes    Start date: 54    Quit date: 1971    Years since quitting: 54.1   Smokeless tobacco: Never  Vaping Use   Vaping status: Never Used  Substance Use Topics   Alcohol use: Yes    Alcohol/week: 3.0 standard drinks of alcohol    Types: 3 Cans of beer per week    Comment: rarely   Drug use: No    Comment: former user of marijuana, quit in 1971   Allergies Nsaids, Dilaudid [hydromorphone hcl], and Sulfa antibiotics  Review of Systems Review of Systems  All other systems reviewed and are negative.   Physical Exam Vital Signs  I have reviewed the triage vital signs BP (!) 149/80 (BP Location: Right Arm)   Pulse (!) 55   Temp (!) 97.1 F (36.2 C)   Resp 20   Ht 5\' 10"  (1.778 m)   Wt 95.3 kg   SpO2 100%   BMI 30.13 kg/m  Physical Exam Vitals and nursing note reviewed.  Constitutional:      General: He is not in acute distress.    Appearance: Normal appearance.  HENT:     Head: Normocephalic and atraumatic.     Mouth/Throat:     Mouth: Mucous membranes are moist.  Eyes:     Conjunctiva/sclera: Conjunctivae normal.  Cardiovascular:     Rate and Rhythm: Normal rate and regular rhythm.  Pulmonary:     Effort: Pulmonary effort is normal. No respiratory distress.     Breath  sounds: Normal breath sounds.  Abdominal:     General: Abdomen is flat.     Palpations: Abdomen is soft.     Tenderness: There is no abdominal tenderness.     Comments: Baseball sized hematoma to the right lower quadrant of the abdomen with tenderness  Musculoskeletal:     Right lower leg: No edema.     Left lower leg: No edema.     Comments: No midline C, T, L-spine tenderness.  No chest wall tenderness or crepitus.  Full painless range of motion at the bilateral upper extremities including the shoulders, elbows, wrists, hand and fingers, and in the bilateral lower extremities including the hips, knees, ankle, toes.  No focal bony tenderness, injury or deformity.   Skin:    General: Skin is warm and dry.     Capillary Refill: Capillary refill takes less than 2 seconds.     Comments: Abrasion to the right knee  Neurological:     Mental Status: He is alert and oriented to person, place, and time. Mental status is at baseline.  Psychiatric:        Mood and Affect: Mood normal.  Behavior: Behavior normal.     ED Results and Treatments Labs (all labs ordered are listed, but only abnormal results are displayed) Labs Reviewed  BASIC METABOLIC PANEL - Abnormal; Notable for the following components:      Result Value   BUN 28 (*)    Creatinine, Ser 1.37 (*)    GFR, Estimated 53 (*)    All other components within normal limits  CBC WITH DIFFERENTIAL/PLATELET - Abnormal; Notable for the following components:   RBC 3.94 (*)    Hemoglobin 12.1 (*)    HCT 37.7 (*)    All other components within normal limits                                                                                                                          Radiology CT ABDOMEN PELVIS W CONTRAST Result Date: 11/13/2023 CLINICAL DATA:  Blunt abdominal trauma.  Fall. EXAM: CT ABDOMEN AND PELVIS WITH CONTRAST TECHNIQUE: Multidetector CT imaging of the abdomen and pelvis was performed using the standard protocol  following bolus administration of intravenous contrast. RADIATION DOSE REDUCTION: This exam was performed according to the departmental dose-optimization program which includes automated exposure control, adjustment of the mA and/or kV according to patient size and/or use of iterative reconstruction technique. CONTRAST:  OMNIPAQUE IOHEXOL 300 MG/ML  SOLN COMPARISON:  Noncontrast CT 06/18/2016 FINDINGS: Lower chest: Clear lung bases. Hepatobiliary: No hepatic injury or perihepatic hematoma. Cyst in the right dome of the liver. No further follow-up imaging is recommended. Gallbladder is unremarkable. Pancreas: No evidence of injury. No ductal dilatation or inflammation. Spleen: No splenic injury or perisplenic hematoma. Adrenals/Urinary Tract: No adrenal hemorrhage or renal injury identified. Bilateral renal cysts. No further follow-up imaging is recommended. Bladder is unremarkable. Stomach/Bowel: Unremarkable appearance of the stomach. Duodenal diverticulum. No bowel obstruction or inflammation. Normal appendix. Moderate colonic stool burden. No colonic wall thickening. Vascular/Lymphatic: Aortic atherosclerosis. No evidence of aortic or vascular injury. No retroperitoneal fluid. Prominent right external iliac nodes, stable from 2021 exam. No suspicious lymphadenopathy. Reproductive: Enlarged prostate gland spans 5.8 cm transverse. Other: Right lower anterior abdominal wall subcutaneous hematoma measures approximately 6.5 x 3.1 x 6 cm, series 2, image 59. There is no evidence of active extravasation. Surrounding fat stranding extends to the abdominal wall musculature consistent with contusion. No free air or free fluid in the abdomen or pelvis. Musculoskeletal: No acute fracture of the included ribs, lower thoracic and lumbar spine, or pelvis. Bilateral hip arthroplasties. Rounded lucency about the right hip acetabular component corresponds to a subchondral cyst on preoperative 2017 exam. IMPRESSION: 1. Right  lower anterior abdominal wall subcutaneous contusion with hematoma measuring 6.5 x 3.1 x 6 cm. No evidence of active extravasation. 2. No additional traumatic injury in the abdomen or pelvis. 3. Prostatomegaly. Electronically Signed   By: Narda Rutherford M.D.   On: 11/13/2023 21:08   DG Knee Complete 4 Views Right Result Date: 11/13/2023 CLINICAL DATA:  Fall, pain EXAM: RIGHT  KNEE - COMPLETE 4+ VIEW COMPARISON:  06/23/2020 FINDINGS: Postsurgical changes from prior right knee arthroplasty with long stem tibial and femoral components. There appears to be some subsidence of the tibial prosthesis with smooth perihardware lucency anteriorly and some chronic appearing fragmentation in the region of the tibial tuberosity. No evidence of loosening of the femoral component. No evidence of an acute periprosthetic fracture. No joint effusion. No focal soft tissue swelling. IMPRESSION: Postsurgical changes from prior right knee arthroplasty with long stem tibial and femoral components. There appears to be some subsidence of the tibial prosthesis with smooth perihardware lucency anteriorly and some chronic appearing fragmentation in the region of the tibial tuberosity. Findings are favored to be chronic. No evidence of an acute periprosthetic fracture. Electronically Signed   By: Duanne Guess D.O.   On: 11/13/2023 19:28    Pertinent labs & imaging results that were available during my care of the patient were reviewed by me and considered in my medical decision making (see MDM for details).  Medications Ordered in ED Medications  iohexol (OMNIPAQUE) 300 MG/ML solution 100 mL (100 mLs Intravenous Contrast Given 11/13/23 2047)                                                                                                                                     Procedures Procedures  (including critical care time)  Medical Decision Making / ED Course   MDM:  78 year old presenting to the emergency department  for fall.  Patient overall well-appearing, physical examination with large abdominal hematoma with mild tenderness.  Given age, will obtain CT abdomen to evaluate for any intra-abdominal trauma.  If this is negative anticipate discharge.  He also has some minimal right knee pain and abrasion, x-rays negative, range of motion of the knee is intact and there is no focal tenderness.  No evidence of any head, spinal trauma, thoracic trauma.  Clinical Course as of 11/13/23 2210  Sun Nov 13, 2023  2209 CT scan shows hematoma without evidence of active extravasation.  Patient still feels well.  Discussed results with the patient. Will discharge patient to home. All questions answered. Patient comfortable with plan of discharge. Return precautions discussed with patient and specified on the after visit summary.  [WS]    Clinical Course User Index [WS] Lonell Grandchild, MD     Additional history obtained: -Additional history obtained from spouse -External records from outside source obtained and reviewed including: Chart review including previous notes, labs, imaging, consultation notes including prior notes    Lab Tests: -I ordered, reviewed, and interpreted labs.   The pertinent results include:   Labs Reviewed  BASIC METABOLIC PANEL - Abnormal; Notable for the following components:      Result Value   BUN 28 (*)    Creatinine, Ser 1.37 (*)    GFR, Estimated 53 (*)    All other components within normal limits  CBC  WITH DIFFERENTIAL/PLATELET - Abnormal; Notable for the following components:   RBC 3.94 (*)    Hemoglobin 12.1 (*)    HCT 37.7 (*)    All other components within normal limits    Notable for stable CKD, mild anemia   Imaging Studies ordered: I ordered imaging studies including XR knee, CT abdomen On my interpretation imaging demonstrates hematoma  I independently visualized and interpreted imaging. I agree with the radiologist interpretation   Medicines ordered and  prescription drug management: Meds ordered this encounter  Medications   iohexol (OMNIPAQUE) 300 MG/ML solution 100 mL    -I have reviewed the patients home medicines and have made adjustments as needed  Co morbidities that complicate the patient evaluation  Past Medical History:  Diagnosis Date   Arthritis    Ascending aortic aneurysm (HCC)    CAD (coronary artery disease)    Chronic kidney disease    Depression    Dysrhythmia    occasional Palps   GERD (gastroesophageal reflux disease)    Hand pain    currently being evaluated by rheumatology for dx    Hematuria    Hypertension    Rapid heart beat    rpeorts  " 2 months ago i had a rapid heart beat that lasted 45 min to an hour, ive experience this twice in the last t10 years but only seconds long" " i was evaluated by cardiology Dr Marilynne Halsted who did a 30 day monitor and ECHO and said i have occasional [PATS] ? , denies chest pain nor LOC  during these episodes; HR today is WDL    Sleep apnea    CPAP       Dispostion: Disposition decision including need for hospitalization was considered, and patient discharged from emergency department.    Final Clinical Impression(s) / ED Diagnoses Final diagnoses:  Hematoma     This chart was dictated using voice recognition software.  Despite best efforts to proofread,  errors can occur which can change the documentation meaning.    Lonell Grandchild, MD 11/13/23 [phone]

## 2023-11-15 DIAGNOSIS — M1990 Unspecified osteoarthritis, unspecified site: Secondary | ICD-10-CM | POA: Diagnosis not present

## 2023-11-15 DIAGNOSIS — I10 Essential (primary) hypertension: Secondary | ICD-10-CM | POA: Diagnosis not present

## 2023-11-15 DIAGNOSIS — W19XXXA Unspecified fall, initial encounter: Secondary | ICD-10-CM | POA: Diagnosis not present

## 2023-11-15 DIAGNOSIS — S301XXA Contusion of abdominal wall, initial encounter: Secondary | ICD-10-CM | POA: Diagnosis not present

## 2023-11-17 DIAGNOSIS — M6281 Muscle weakness (generalized): Secondary | ICD-10-CM | POA: Diagnosis not present

## 2023-11-17 DIAGNOSIS — M25612 Stiffness of left shoulder, not elsewhere classified: Secondary | ICD-10-CM | POA: Diagnosis not present

## 2023-11-17 DIAGNOSIS — M19012 Primary osteoarthritis, left shoulder: Secondary | ICD-10-CM | POA: Diagnosis not present

## 2023-11-18 DIAGNOSIS — M19012 Primary osteoarthritis, left shoulder: Secondary | ICD-10-CM | POA: Diagnosis not present

## 2023-11-21 DIAGNOSIS — M6281 Muscle weakness (generalized): Secondary | ICD-10-CM | POA: Diagnosis not present

## 2023-11-21 DIAGNOSIS — M25612 Stiffness of left shoulder, not elsewhere classified: Secondary | ICD-10-CM | POA: Diagnosis not present

## 2023-11-21 DIAGNOSIS — M19012 Primary osteoarthritis, left shoulder: Secondary | ICD-10-CM | POA: Diagnosis not present

## 2023-11-24 DIAGNOSIS — M6281 Muscle weakness (generalized): Secondary | ICD-10-CM | POA: Diagnosis not present

## 2023-11-24 DIAGNOSIS — R262 Difficulty in walking, not elsewhere classified: Secondary | ICD-10-CM | POA: Diagnosis not present

## 2023-11-26 ENCOUNTER — Other Ambulatory Visit (HOSPITAL_COMMUNITY): Payer: Self-pay

## 2023-11-27 ENCOUNTER — Other Ambulatory Visit: Payer: Self-pay

## 2023-11-28 ENCOUNTER — Other Ambulatory Visit (HOSPITAL_COMMUNITY): Payer: Self-pay

## 2023-11-28 DIAGNOSIS — M25612 Stiffness of left shoulder, not elsewhere classified: Secondary | ICD-10-CM | POA: Diagnosis not present

## 2023-11-28 DIAGNOSIS — M6281 Muscle weakness (generalized): Secondary | ICD-10-CM | POA: Diagnosis not present

## 2023-11-28 DIAGNOSIS — M19012 Primary osteoarthritis, left shoulder: Secondary | ICD-10-CM | POA: Diagnosis not present

## 2023-11-28 MED ORDER — BUPROPION HCL ER (SR) 100 MG PO TB12
100.0000 mg | ORAL_TABLET | Freq: Two times a day (BID) | ORAL | 5 refills | Status: DC
  Filled 2023-11-28: qty 60, 30d supply, fill #0
  Filled 2024-01-04: qty 60, 30d supply, fill #1
  Filled 2024-01-27 – 2024-02-09 (×2): qty 60, 30d supply, fill #2
  Filled 2024-02-27 – 2024-03-09 (×2): qty 60, 30d supply, fill #3
  Filled 2024-03-31 – 2024-04-08 (×2): qty 60, 30d supply, fill #4
  Filled 2024-05-08: qty 60, 30d supply, fill #5

## 2023-11-29 ENCOUNTER — Other Ambulatory Visit: Payer: Self-pay

## 2023-12-02 DIAGNOSIS — M25612 Stiffness of left shoulder, not elsewhere classified: Secondary | ICD-10-CM | POA: Diagnosis not present

## 2023-12-02 DIAGNOSIS — M6281 Muscle weakness (generalized): Secondary | ICD-10-CM | POA: Diagnosis not present

## 2023-12-02 DIAGNOSIS — M19012 Primary osteoarthritis, left shoulder: Secondary | ICD-10-CM | POA: Diagnosis not present

## 2023-12-06 ENCOUNTER — Telehealth: Payer: Self-pay | Admitting: Adult Health

## 2023-12-06 DIAGNOSIS — G4733 Obstructive sleep apnea (adult) (pediatric): Secondary | ICD-10-CM

## 2023-12-06 NOTE — Telephone Encounter (Signed)
 Marland Kitchen

## 2023-12-06 NOTE — Telephone Encounter (Signed)
 At 12:32 pt left vm asking to make an appointment to be seen about problems with his CPAP.  Pt stated normally his incidents are around 5, they are now doubling and even tripling .  Pt has scheduled an appointment and is on wait list, please call to discuss, pt believes he has had the CPAP around 4 years, no leaking. Pt thinks there may be a need to increase pressure in CPAP.

## 2023-12-06 NOTE — Telephone Encounter (Signed)
 Order placed for mask refit and to increase cpap pressure to 14 cm H2O. I called the patient and let him know the plan. As long as he tolerates increase in pressure, will do 14. Also needs mask refit. Patient verbalized agreement. I recommended he touch base with [name] in 2 weeks after changes are made to assess data. Will discuss getting new machine at next appt. Pt was very appreciative.   Order sent to [facility]. I also made the change in pressure on Airview.

## 2023-12-06 NOTE — Addendum Note (Signed)
 Addended by: Bertram Savin on: 12/06/2023 04:33 PM   Modules accepted: Orders

## 2023-12-06 NOTE — Telephone Encounter (Signed)
 Okay to order mask refitting but lets also bump his pressure up to 14 cm of water as long as he can tolerate it

## 2023-12-07 NOTE — Telephone Encounter (Signed)
 New, Maryella Shivers, Otilio Jefferson, RN; Alain Honey; Jeris Penta, [place]; 1 other Received, thank you!

## 2023-12-08 ENCOUNTER — Other Ambulatory Visit (HOSPITAL_COMMUNITY): Payer: Self-pay

## 2023-12-08 DIAGNOSIS — M6281 Muscle weakness (generalized): Secondary | ICD-10-CM | POA: Diagnosis not present

## 2023-12-08 DIAGNOSIS — R262 Difficulty in walking, not elsewhere classified: Secondary | ICD-10-CM | POA: Diagnosis not present

## 2023-12-12 DIAGNOSIS — M25612 Stiffness of left shoulder, not elsewhere classified: Secondary | ICD-10-CM | POA: Diagnosis not present

## 2023-12-12 DIAGNOSIS — M6281 Muscle weakness (generalized): Secondary | ICD-10-CM | POA: Diagnosis not present

## 2023-12-12 DIAGNOSIS — M19012 Primary osteoarthritis, left shoulder: Secondary | ICD-10-CM | POA: Diagnosis not present

## 2023-12-16 DIAGNOSIS — Z1211 Encounter for screening for malignant neoplasm of colon: Secondary | ICD-10-CM | POA: Diagnosis not present

## 2023-12-20 ENCOUNTER — Other Ambulatory Visit (HOSPITAL_COMMUNITY): Payer: Self-pay

## 2023-12-20 DIAGNOSIS — M6281 Muscle weakness (generalized): Secondary | ICD-10-CM | POA: Diagnosis not present

## 2023-12-20 DIAGNOSIS — I10 Essential (primary) hypertension: Secondary | ICD-10-CM | POA: Diagnosis not present

## 2023-12-20 DIAGNOSIS — M19012 Primary osteoarthritis, left shoulder: Secondary | ICD-10-CM | POA: Diagnosis not present

## 2023-12-20 DIAGNOSIS — R1031 Right lower quadrant pain: Secondary | ICD-10-CM | POA: Diagnosis not present

## 2023-12-20 DIAGNOSIS — S301XXA Contusion of abdominal wall, initial encounter: Secondary | ICD-10-CM | POA: Diagnosis not present

## 2023-12-20 DIAGNOSIS — M25612 Stiffness of left shoulder, not elsewhere classified: Secondary | ICD-10-CM | POA: Diagnosis not present

## 2023-12-20 MED ORDER — PREDNISONE 5 MG (21) PO TBPK
ORAL_TABLET | ORAL | 0 refills | Status: AC
  Filled 2023-12-20: qty 21, 6d supply, fill #0

## 2023-12-20 NOTE — Telephone Encounter (Signed)
 Pt called stating that he is still having a high number of incidents even after the mask fitting and he would like to discuss with the RN. Please advise.

## 2023-12-20 NOTE — Telephone Encounter (Signed)
 We could try autoset 5-15 and see if that helps. Certainly if he feels his mask is leaking more than the previous one he can switch back.  Also should probably getting in for a sooner appointment with either me or the MD

## 2023-12-20 NOTE — Telephone Encounter (Signed)
 Can I see report for just the last two weeks

## 2023-12-21 NOTE — Telephone Encounter (Signed)
 Pt called back.  He said that he would like to wait several more days since he changed/ loosened his mask where (was pinching/tight around his lip)  last night he slept for 7.5 hrs.  I relayed let us know we can change his machine from cpap to autopap settings (2 settings one lower and higher setting, and how he is breathing at night will kick in between pressures vs one set pressure).  He appreciated call back.. I told him also about mask (if he felt older mask was better could switch back).  He will let us know next week.  Appreciated call back.

## 2023-12-22 DIAGNOSIS — M6281 Muscle weakness (generalized): Secondary | ICD-10-CM | POA: Diagnosis not present

## 2023-12-22 DIAGNOSIS — R262 Difficulty in walking, not elsewhere classified: Secondary | ICD-10-CM | POA: Diagnosis not present

## 2023-12-26 DIAGNOSIS — M25612 Stiffness of left shoulder, not elsewhere classified: Secondary | ICD-10-CM | POA: Diagnosis not present

## 2023-12-26 DIAGNOSIS — M6281 Muscle weakness (generalized): Secondary | ICD-10-CM | POA: Diagnosis not present

## 2023-12-26 DIAGNOSIS — M19012 Primary osteoarthritis, left shoulder: Secondary | ICD-10-CM | POA: Diagnosis not present

## 2023-12-27 ENCOUNTER — Telehealth: Payer: Self-pay | Admitting: Adult Health

## 2023-12-27 DIAGNOSIS — G4733 Obstructive sleep apnea (adult) (pediatric): Secondary | ICD-10-CM

## 2023-12-27 NOTE — Telephone Encounter (Signed)
 Order placed for autopap 5-15 EPR 2

## 2023-12-27 NOTE — Telephone Encounter (Signed)
 Pt said was discussed last week an option to switch to Autopap. Patient decided to switch to Autopap from CPAP. Would like a call from the nurse

## 2023-12-28 DIAGNOSIS — M6281 Muscle weakness (generalized): Secondary | ICD-10-CM | POA: Diagnosis not present

## 2023-12-28 DIAGNOSIS — R262 Difficulty in walking, not elsewhere classified: Secondary | ICD-10-CM | POA: Diagnosis not present

## 2023-12-28 NOTE — Telephone Encounter (Signed)
 I called pt and relayed that change made for the autopap pressure changes. He was aware, and will let us know.

## 2023-12-28 NOTE — Telephone Encounter (Signed)
 New, Doristine Mango, RN; Salena Saner; Kathe Becton; 1 other Received, thank you!     Previous Messages    ----- Message ----- From: Guy Begin, RN Sent: 12/28/2023   9:18 AM EDT To: Kathyrn Sheriff; Santina Evans; Kathe Becton; * Subject: change to autopap setting                      Good morning,  Beacher May. Male, 78 y.o., Aug 26, 1946 Pronouns: he/him/his MRN: [id number]  New setting to autopap setting from cpap  (placed in airview already).  Delmer Islam

## 2024-01-02 NOTE — Telephone Encounter (Signed)
 Pt called to discuss. Please advise.

## 2024-01-02 NOTE — Telephone Encounter (Signed)
 I called pt and LMVM for him that returned call.  He has a appt with Dr. Frances Furbish 01-04-2024.  He can call back if needed.

## 2024-01-04 ENCOUNTER — Other Ambulatory Visit: Payer: Self-pay

## 2024-01-04 ENCOUNTER — Other Ambulatory Visit (HOSPITAL_COMMUNITY): Payer: Self-pay

## 2024-01-04 ENCOUNTER — Ambulatory Visit (INDEPENDENT_AMBULATORY_CARE_PROVIDER_SITE_OTHER): Admitting: Neurology

## 2024-01-04 ENCOUNTER — Encounter: Payer: Self-pay | Admitting: Neurology

## 2024-01-04 VITALS — BP 109/60 | HR 54 | Ht 70.0 in | Wt 219.2 lb

## 2024-01-04 DIAGNOSIS — L821 Other seborrheic keratosis: Secondary | ICD-10-CM | POA: Diagnosis not present

## 2024-01-04 DIAGNOSIS — Z85828 Personal history of other malignant neoplasm of skin: Secondary | ICD-10-CM | POA: Diagnosis not present

## 2024-01-04 DIAGNOSIS — D692 Other nonthrombocytopenic purpura: Secondary | ICD-10-CM | POA: Diagnosis not present

## 2024-01-04 DIAGNOSIS — L820 Inflamed seborrheic keratosis: Secondary | ICD-10-CM | POA: Diagnosis not present

## 2024-01-04 DIAGNOSIS — Z789 Other specified health status: Secondary | ICD-10-CM

## 2024-01-04 DIAGNOSIS — G4733 Obstructive sleep apnea (adult) (pediatric): Secondary | ICD-10-CM | POA: Diagnosis not present

## 2024-01-04 DIAGNOSIS — L814 Other melanin hyperpigmentation: Secondary | ICD-10-CM | POA: Diagnosis not present

## 2024-01-04 MED ORDER — ATENOLOL 25 MG PO TABS
25.0000 mg | ORAL_TABLET | Freq: Every day | ORAL | 1 refills | Status: DC
  Filled 2024-01-04 – 2024-02-27 (×4): qty 90, 90d supply, fill #0
  Filled 2024-03-31 – 2024-05-08 (×2): qty 90, 90d supply, fill #1

## 2024-01-04 MED ORDER — LOSARTAN POTASSIUM 100 MG PO TABS
100.0000 mg | ORAL_TABLET | Freq: Every day | ORAL | 3 refills | Status: AC
  Filled 2024-01-04 – 2024-02-27 (×4): qty 90, 90d supply, fill #0
  Filled 2024-06-01: qty 90, 90d supply, fill #1
  Filled 2024-08-28: qty 90, 90d supply, fill #2

## 2024-01-04 MED ORDER — PANTOPRAZOLE SODIUM 20 MG PO TBEC
20.0000 mg | DELAYED_RELEASE_TABLET | Freq: Every day | ORAL | 3 refills | Status: AC
  Filled 2024-01-04: qty 90, 90d supply, fill #0
  Filled 2024-01-27 – 2024-03-31 (×3): qty 90, 90d supply, fill #1
  Filled 2024-07-05: qty 90, 90d supply, fill #2
  Filled 2024-10-03: qty 90, 90d supply, fill #3

## 2024-01-04 NOTE — Patient Instructions (Signed)
 It was nice to see you again today.  As discussed, I will order a home sleep test for re-evaluation of your sleep apnea, as testing was over 5 years ago. The home sleep test is done (HST) to re-establish/confirm the sleep apnea diagnosis and to get your a new machine through the insurance. Our sleep lab staff will reach out to you to arrange for pickup and for tutorial of the test equipment. I will write for a new machine after the HST confirms the OSA (obstructive sleep apnea) diagnosis. Please remember, you will not use your CPAP machine during the night of testing so we get diagnostic data, not treatment data. We will schedule a follow-up appointment after the new machine set-up date, typically within 31 to 89 days post treatment start.  Per insurance requirement, you will need to show compliance with usage and fulfill a minimum usage percentage.

## 2024-01-04 NOTE — Progress Notes (Signed)
 Subjective:    Patient ID: Joseph Maldonado. is a 78 y.o. male.  HPI    Interim history:   Joseph Maldonado is a 78 year old right-handed gentleman with the patient is unaccompanied today.  He was last seen in our sleep clinic in June 2024 by Everlene Other, NP, at which time he was fully compliant with his CPAP of 13 cm with EPR of 1, residual AHI was borderline high at 6.6/h.  He continued to benefit from treatment.  He was advised to follow-up routinely in 1 year.  He requested a sooner than scheduled appointment.   Today, 01/04/2024: I reviewed his CPAP compliance data from 12/04/2023 through 01/02/2024, which is a total of 30 days, during which time he used his machine 24 days with percent use days greater than 4 hours at 70%, indicating adequate compliance, average usage of 6 hours and 10 minutes, residual AHI mildly elevated at 11.9/h with mostly obstructive events per download, set pressure of 14 cm with EPR of He reports full compliance with treatments, nevertheless, his residual AHI fluctuates and tends to be in the low double digits.  He has switched to a hybrid style fullface mask about 3 to 4 weeks ago which he can tolerate a lot better and the leak seems less for him.  He is eligible for a new machine, in fact, he has noticed water accumulation underneath the machine.  He recently got a new fire unit but has been alternating the new and his old humidifier just for cleanliness.  His set up date was 11/08/2017 on this machine.   The patient's allergies, current medications, family history, past medical history, past social history, past surgical history and problem list were reviewed and updated as appropriate.    Previously:  03/24/2023 Joseph Penny, NP): <<Joseph Maldonado. is a 78 y.o. male with a history of OSA on CPAP.Marland Kitchen Returns today for follow-up. Reports that CPAP is beneficial. Can tell a difference when he doesn't use it. His DL is below.>>   05/25/5620 Ihor Austin, NP): <<Patient  returns for 1 year CPAP follow-up.  Previously seen by Dr. Frances Furbish 02/04/2021.  He has had difficulty using machine for the past few months as his wife had a fall back in January and required shoulder surgery, he was up frequently throughout the night caring for her. He has been trying to get back on track using his machine more consistently. Has been having issues with leaks - has mask adjustment this morning at [facility]. Occasionally has difficulty taking a deep breath, will have to remove mask. Needs order for new supplies. ESS 3/24. No further concerns. >>   02/04/2021: I reviewed his CPAP compliance data from 01/04/2021 through 02/02/2021, which is a total of 30 days, during which time he used his machine 29 days with percent use days greater than 4 hours at 83%, indicating very good compliance with an average usage of 6 hours and 31 minutes, residual AHI slightly borderline at 5.7/h, leak on the higher side with a 95th percentile at 30.9 mL/min, pressure of 13 cm with EPR of 1.  Leak numbers improved and that has 10 days.  He reports doing better recently.  He tried an AirTouch full facemask which did not work for him.  He switched back to his previous fullface mask and headgear and things are looking better, events are still fluctuating but have come down since the increase in pressure.  His wife from time to time notices an apnea and no significant  snoring.  He is quite pleased with how things are going, however, has discomfort at night, some nights worse than others because of his shoulder arthritis.  He will eventually need a shoulder replacement but would like to hold off for now.  His range of motion is still fairly good he states, it is just painful.  He takes Tylenol 1 g twice daily on a day-to-day basis.  He has regular follow-up with his cardiologist for his aortic aneurysm.  He typically gets an echocardiogram every other year now.  He gets regular labs through his primary care physician.  He does have  chronic kidney impairment. I saw him on 09/10/20, at which time he was compliant with his CPAP and residual AHI was at goal.  He was advised to follow-up in 1 year.  He called in the interim in late March reporting that his wife had noted increased apneas while he was on CPAP.  A compliance review indicated a borderline residual AHI of 6.1/h.  He was advised to continue with CPAP of 12 cm but requested additional review as he had noticed increase in events on his machine, I therefore increased his set pressure to 13 cm.     I saw him in a virtual visit on 04/05/2019, at which time he was compliant with his CPAP and doing well.  He was advised to follow-up in 1 year.   I reviewed his CPAP compliance data from 08/11/2020 through 09/09/2020, which is a total of 30 days, during which time he used his machine every night with percent use days greater than 4 hours at 80%, indicating very good compliance with an average usage of 5 hours and 9 minutes, residual AHI at goal at 3.8/h, leak on the high side with a 95th percentile at 24.6 L/min and a pressure of 12 cm with EPR of 1.    I saw him on 10/05/2018, at which time we talked about his sleep study results, he was compliant with CPAP and doing well, he felt improved with regards to his daytime energy level and nighttime sleep consolidation and sleep quality.  He had undergone right total hip replacement in November 2018 and had to cancel an interim follow-up appointment because of his surgery and also his dog needed surgery.  He had lost weight over time and was able to keep it off.  He was using a fullface mask but had tried another type of mask in the interim.     I reviewed his CPAP compliance data from 03/05/2019 through 04/03/2019 which is a total of 30 days, during which time he used his machine every night with percent use days greater than 4 hours at 90%, indicating excellent compliance with an average usage of 6 hours and 53 minutes, residual AHI borderline at  4.8/h, leak on the higher end with a 95th percentile at 19.9 L/min on a pressure of 12 cm with EPR of 1.       I first met him on 04/06/2018 at the request of his primary care physician, at which time he reported snoring and daytime somnolence. He was advised to proceed with a sleep study. He had a split-night sleep study on 05/22/2018. I went over his test results with him in detail today. Baseline sleep latency was delayed at 91.5 minutes and REM sleep was absent. He had a total AHI of 52.2 per hour, average oxygen saturation was 96%, nadir was 78%. He had no significant PLMS, no significant EKG changes. He  was fitted with a large full facemask due to being a mouth breather and CPAP was titrated from 5 cm to 12 cm. On the final pressure his AHI was 0 per hour, with supine non-REM sleep achieved an O2 nadir of 95%. Based on his test results I prescribed CPAP therapy for home use at a pressure of 12 cm.      I reviewed his CPAP compliance data from 09/04/2018 through 10/03/2018 which is a total of 30 days, during which time he used his CPAP every night with percent used days greater than 4 hours at 97%, indicating excellent compliance with an average usage of 7 hours and 17 minutes, residual AHI at goal at 3.8 per hour, leak on the higher end with the 95th percentile at 23.7 L/m on a pressure of 12 cm with EPR of 1.    04/06/2018: (He) reports snoring and excessive daytime somnolence. His wife is noted pauses in his breathing while asleep. He saw Dr. Lucia Gaskins last year after a fall. I reviewed your office note from 01/24/2018 as well as 03/13/2018. He recently saw pulmonology for his chronic cough. His Epworth sleepiness score is 3 out of 24, fatigue score is 32 out of 63. He denies a family history of sleep apnea. He reports a family history of brain aneurysm in his mother and brain cancer in his father. He would like to have a brain MRI done but cannot have contrast because of chronic kidney disease. For his  chronic cough he was recently started on medication by pulmonology including anti-histamine and reflux medicine. His wife reports that there are some nights where he snores very little and has no apneas but there are some other nights where he has louder snoring and breathing pauses as well as gasping sounds. He has nocturia about 2-3 times per average night and denies morning headaches. 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 They have 2 grown children, a daughter and a son. Wife reports that his family and she have noticed changes in his behavior and low frustration tolerance recently. Of note, patient as his intolerance is provoked. He quit smoking in 1970. He drinks less than 2 beers per week and has not had any alcohol in several weeks. He drinks caffeine in the form of 2 large cups per day in the mornings. They have 2 dogs and 1 cat, dogs are often in the bedroom at night. His bedtime is between 10 and 11 PM, rise time around 7 or 7:30. He had a uvulectomy when he was in his 30s for snoring and he had a tonsillectomy as a child. He typically sleeps on his back because of bilateral shoulder pain. He had his left hip replaced as well as both knees.  His Past Medical History Is Significant For: Past Medical History:  Diagnosis Date   Arthritis    Ascending aortic aneurysm (HCC)    CAD (coronary artery disease)    Chronic kidney disease    Depression    Dysrhythmia    occasional Palps   GERD (gastroesophageal reflux disease)    Hand pain    currently being evaluated by rheumatology for dx    Hematuria    Hypertension    Rapid heart beat    rpeorts  " 2 months ago i had a rapid heart beat that lasted 45 min to an hour, ive experience this twice in the last t10 years but only seconds long" " i was evaluated  by cardiology Dr Marilynne Halsted who did a 30 day monitor and ECHO and said i have occasional [PATS] ? , denies chest pain nor LOC  during these episodes; HR today is WDL    Sleep  apnea    CPAP     His Past Surgical History Is Significant For: Past Surgical History:  Procedure Laterality Date   CATARACT EXTRACTION, BILATERAL     HIP SURGERY Left 2012   JOINT REPLACEMENT     REPLACEMENT TOTAL KNEE Right    2001,2006,2009,2013   REPLACEMENT TOTAL KNEE Left 2011   REVERSE SHOULDER ARTHROPLASTY Right 01/20/2023   Procedure: REVERSE SHOULDER ARTHROPLASTY;  Surgeon: Bjorn Pippin, MD;  Location: [facility];  Service: Orthopedics;  Laterality: Right;   REVERSE SHOULDER ARTHROPLASTY Left 10/19/2023   Procedure: REVERSE SHOULDER ARTHROPLASTY;  Surgeon: Bjorn Pippin, MD;  Location: [facility];  Service: Orthopedics;  Laterality: Left;   TOTAL HIP ARTHROPLASTY Right 08/14/2018   Procedure: TOTAL HIP ARTHROPLASTY ANTERIOR APPROACH;  Surgeon: Gean Birchwood, MD;  Location: [facility];  Service: Orthopedics;  Laterality: Right;    His Family History Is Significant For: Family History  Problem Relation Age of Onset   Cancer Mother        throat   Hypertension Mother    Heart disease Mother    Emphysema Mother    Hypertension Father    Cancer Father        Brain   Hypertension Sister    Hematuria Sister    Hematuria Brother    Alzheimer's disease Neg Hx    Dementia Neg Hx     His Social History Is Significant For: Social History   Socioeconomic History   Marital status: Married    Spouse name: Not on file   Number of children: 2   Years of education: Not on file   Highest education level: Bachelor's degree (e.g., BA, AB, BS)  Occupational History   Occupation: Retired  Tobacco Use   Smoking status: Former    Current packs/day: 0.00    Average packs/day: 0.5 packs/day for 3.0 years (1.5 ttl pk-yrs)    Types: Cigarettes    Start date: 1968    Quit date: 1971    Years since quitting: 54.3   Smokeless tobacco: Never  Vaping Use   Vaping status: Never Used  Substance and Sexual Activity   Alcohol use: Yes    Alcohol/week: 3.0 standard drinks of  alcohol    Types: 3 Cans of beer per week    Comment: rarely   Drug use: No    Comment: former user of marijuana, quit in 1971   Sexual activity: Yes  Other Topics Concern   Not on file  Social History Narrative   Lives with his wife.   Education: College   Exercise: No   Drinks about 2 large coffees per day   Right handed      Spent many years in [place].  Moved to [place] to be near their children.  Daughter lives in [place].  Their son has lived in the [place] area with plans to move to [place] in summer 2019.   Social Drivers of Corporate investment banker Strain: Low Risk  (08/08/2017)   Overall Financial Resource Strain (CARDIA)    Difficulty of Paying Living Expenses: Not hard at all  Food Insecurity: No Food Insecurity (10/19/2023)   Hunger Vital Sign    Worried About Running Out of Food in the Last Year:  Never true    Ran Out of Food in the Last Year: Never true  Transportation Needs: No Transportation Needs (10/19/2023)   PRAPARE - Administrator, Civil Service (Medical): No    Lack of Transportation (Non-Medical): No  Physical Activity: Inactive (08/08/2017)   Exercise Vital Sign    Days of Exercise per Week: 0 days    Minutes of Exercise per Session: 0 min  Stress: No Stress Concern Present (08/08/2017)   [employer] of Occupational Health - Occupational Stress Questionnaire    Feeling of Stress : Not at all  Social Connections: Socially Integrated (10/19/2023)   Social Connection and Isolation Panel [NHANES]    Frequency of Communication with Friends and Family: More than three times a week    Frequency of Social Gatherings with Friends and Family: Once a week    Attends Religious Services: More than 4 times per year    Active Member of [employer] or Organizations: Yes    Attends Engineer, structural: More than 4 times per year    Marital Status: Married    His Allergies Are:  Allergies  Allergen Reactions   Nsaids     Affects  creatinine   Dilaudid [Hydromorphone Hcl] Other (See Comments)    Suicidal   Sulfa Antibiotics     Other reaction(s): GI Intolerance  :   His Current Medications Are:  Outpatient Encounter Medications as of 01/04/2024  Medication Sig   acetic acid 2 % otic solution Place 5 drops into the affected ear twice daily.   atenolol (TENORMIN) 25 MG tablet Take 1 tablet (25 mg total) by mouth daily (Patient taking differently: Take 25 mg by mouth every evening.)   atorvastatin (LIPITOR) 20 MG tablet Take 1 tablet (20 mg total) by mouth daily. (Patient taking differently: Take 20 mg by mouth every evening.)   buPROPion ER (WELLBUTRIN SR) 100 MG 12 hr tablet Take 1 tablet (100 mg total) by mouth 2 (two) times daily.   cholecalciferol (VITAMIN D3) 25 MCG (1000 UNIT) tablet Take 1,000 Units by mouth in the morning.   losartan (COZAAR) 100 MG tablet Take 1 tablet (100 mg) by mouth daily (Patient taking differently: Take 100 mg by mouth every evening.)   Multiple Vitamin (MULTIVITAMIN WITH MINERALS) TABS tablet Take 1 tablet by mouth every evening.   pantoprazole (PROTONIX) 20 MG tablet Take 1 tablet (20 mg total) by mouth daily.   sertraline (ZOLOFT) 50 MG tablet Take 1 tablet (50 mg total) by mouth daily.   sodium bicarbonate 650 MG tablet Take 1 tablet (650 mg total) by mouth 2 (two) times daily.   tizanidine (ZANAFLEX) 2 MG capsule Take 2 mg by mouth at bedtime as needed (stiffness/sleep.).   buPROPion ER (WELLBUTRIN SR) 100 MG 12 hr tablet Take 1 tablet by mouth once daily (Patient not taking: Reported on 10/06/2023)   gabapentin (NEURONTIN) 100 MG capsule Take 1 capsule (100 mg total) by mouth 3 (three) times daily. For pain (Patient not taking: Reported on 01/04/2024)   methocarbamol (ROBAXIN) 500 MG tablet Take 1 tablet (500 mg total) by mouth every 8 (eight) hours as needed for muscle spasms. (Patient not taking: Reported on 01/04/2024)   methylPREDNISolone (MEDROL) 4 MG TBPK tablet Take as prescribed on  dose pack over 6 days (Patient not taking: Reported on 01/04/2024)   mupirocin ointment (BACTROBAN) 2 % Apply a small amount twice a day intranasally for 14 days. (Patient not taking: Reported on 01/04/2024)  predniSONE (STERAPRED UNI-PAK 21 TAB) 5 MG (21) TBPK tablet Take 6 tablets (60 mg total) by mouth on day 1, then 5 tablets (50 mg total) on day 2, then 4 tablets (40 mg total) on day 3, then 3 tablets (30 mg total) on day 4, then 2 tablets (20 mg total) on day 5, and then 1 tablet (10 mg total) on day 6. (Patient not taking: Reported on 01/04/2024)   sertraline (ZOLOFT) 50 MG tablet Take 0.5 tablets (25 mg total) by mouth daily for 14 days, THEN 1 tablet (50 mg total) daily .   No facility-administered encounter medications on file as of 01/04/2024.  :  Review of Systems:  Out of a complete 14 point review of systems, all are reviewed and negative with the exception of these symptoms as listed below:  Review of Systems  Neurological:        Worsening issues with cpap/ tried different pressures, mask, then changed to autopap is worse. ESS 3, FSS 12.     Objective:  Neurological Exam  Physical Exam Physical Examination:   Vitals:   01/04/24 0750  BP: 109/60  Pulse: (!) 54    General Examination: The patient is a very pleasant 78 y.o. male in no acute distress. He appears well-developed and well-nourished and well groomed.   HEENT: Normocephalic, atraumatic, pupils are equal, round and reactive to light, extraocular tracking is well preserved, hearing is grossly intact, he has bilateral hearing aids in place. Face is symmetric with normal facial animation, speech is clear without dysarthria, hypophonia or voice tremor. No carotid bruits. Airway examination reveals mild to moderate mouth dryness, adequate dental hygiene and mild airway crowding.   Chest: Clear to auscultation without wheezing, rhonchi or crackles noted.   Heart: S1+S2+0, regular and normal without murmurs, rubs or gallops  noted.    Abdomen: Soft, non-tender and non-distended.   Extremities: There is no obvious swelling in the distal lower extremities bilaterally.   Skin: Warm and dry without trophic changes noted.    Musculoskeletal: exam reveals no obvious change. Right leg shorter than left, right knee swelling. Unequal hip height noted, slight upper body tilt to the right, all stable. Mild decrease in ROM in R shoulder.   Neurologically:  Mental status: The patient is awake, alert and oriented in all 4 spheres. His immediate and remote memory, attention, language skills and fund of knowledge are appropriate. There is no evidence of aphasia, agnosia, apraxia or anomia. Speech is clear with normal prosody and enunciation. Thought process is linear. Mood is normal and affect is blunted.  Cranial nerves II - XII are as described above under HEENT exam.  Motor exam: Normal bulk, strength and tone is noted. There is tremor. Fine motor skills are grossly intact. Cerebellar testing shows no dysmetria or intention tremor.  No gait ataxia. Sensory exam: intact to light touch.   Gait, station and balance: He stands with mild difficulty. Posture is age-appropriate with slight upper body tilt to the right, slight limp on the right. He has no walking aid.   Assessment and Plan:    In summary, Joseph Maldonado. is a very pleasant 78 year old male with an underlying medical history of arthritis, with status post multiple joint replacement surgeries, chronic kidney disease, depression, hypertension, reflux disease, chronic cough, cognitive complaints (followed by Dr. Lucia Gaskins and Joseph Penny, NP) and overweight state, who presents for follow-up consultation of his obstructive sleep apnea, established on CPAP therapy.  He has an older  machine and has had residual sleep disordered breathing on the current pressure despite an increase in pressure.  He has had some water accumulation inside of underneath the machine despite getting  a new humidifier unit.  We will proceed with a home sleep test for reevaluation of his obstructive sleep apnea.  Of note, we started him on a pressure of 12 cm and then increased it to 13 cm in March 2022 due to increasing events.  He is well-established on CPAP, has been compliant with treatment and has benefited from it.  We increased the pressure from 12 cm to 13 cm in March 2022 after he noted increase in events.  He had a split-night sleep study through our sleep lab on 05/22/2018.  This showed severe obstructive sleep apnea with a baseline AHI of 52.2/h, O2 nadir 78%.  We will plan a follow-up after sleep testing and after he is established on a new CPAP machine.  I answered all his questions today and he was in agreement with our plan. I spent 30 minutes in total face-to-face time and in reviewing records during pre-charting, more than 50% of which was spent in counseling and coordination of care, reviewing test results, reviewing medications and treatment regimen and/or in discussing or reviewing the diagnosis of OSA, the prognosis and treatment options. Pertinent laboratory and imaging test results that were available during this visit with the patient were reviewed by me and considered in my medical decision making (see chart for details).

## 2024-01-05 DIAGNOSIS — M6281 Muscle weakness (generalized): Secondary | ICD-10-CM | POA: Diagnosis not present

## 2024-01-05 DIAGNOSIS — M25612 Stiffness of left shoulder, not elsewhere classified: Secondary | ICD-10-CM | POA: Diagnosis not present

## 2024-01-05 DIAGNOSIS — M19012 Primary osteoarthritis, left shoulder: Secondary | ICD-10-CM | POA: Diagnosis not present

## 2024-01-09 DIAGNOSIS — M25612 Stiffness of left shoulder, not elsewhere classified: Secondary | ICD-10-CM | POA: Diagnosis not present

## 2024-01-09 DIAGNOSIS — M19012 Primary osteoarthritis, left shoulder: Secondary | ICD-10-CM | POA: Diagnosis not present

## 2024-01-09 DIAGNOSIS — M6281 Muscle weakness (generalized): Secondary | ICD-10-CM | POA: Diagnosis not present

## 2024-01-18 DIAGNOSIS — H9313 Tinnitus, bilateral: Secondary | ICD-10-CM | POA: Diagnosis not present

## 2024-01-18 DIAGNOSIS — R262 Difficulty in walking, not elsewhere classified: Secondary | ICD-10-CM | POA: Diagnosis not present

## 2024-01-18 DIAGNOSIS — M6281 Muscle weakness (generalized): Secondary | ICD-10-CM | POA: Diagnosis not present

## 2024-01-18 DIAGNOSIS — H903 Sensorineural hearing loss, bilateral: Secondary | ICD-10-CM | POA: Diagnosis not present

## 2024-01-20 DIAGNOSIS — R262 Difficulty in walking, not elsewhere classified: Secondary | ICD-10-CM | POA: Diagnosis not present

## 2024-01-20 DIAGNOSIS — M6281 Muscle weakness (generalized): Secondary | ICD-10-CM | POA: Diagnosis not present

## 2024-01-20 DIAGNOSIS — M19012 Primary osteoarthritis, left shoulder: Secondary | ICD-10-CM | POA: Diagnosis not present

## 2024-01-20 DIAGNOSIS — M25612 Stiffness of left shoulder, not elsewhere classified: Secondary | ICD-10-CM | POA: Diagnosis not present

## 2024-01-23 DIAGNOSIS — M6281 Muscle weakness (generalized): Secondary | ICD-10-CM | POA: Diagnosis not present

## 2024-01-23 DIAGNOSIS — M25612 Stiffness of left shoulder, not elsewhere classified: Secondary | ICD-10-CM | POA: Diagnosis not present

## 2024-01-23 DIAGNOSIS — R262 Difficulty in walking, not elsewhere classified: Secondary | ICD-10-CM | POA: Diagnosis not present

## 2024-01-23 DIAGNOSIS — M19012 Primary osteoarthritis, left shoulder: Secondary | ICD-10-CM | POA: Diagnosis not present

## 2024-01-27 ENCOUNTER — Ambulatory Visit: Admitting: Neurology

## 2024-01-27 ENCOUNTER — Other Ambulatory Visit (HOSPITAL_COMMUNITY): Payer: Self-pay

## 2024-01-27 ENCOUNTER — Other Ambulatory Visit: Payer: Self-pay

## 2024-01-27 DIAGNOSIS — G4733 Obstructive sleep apnea (adult) (pediatric): Secondary | ICD-10-CM | POA: Diagnosis not present

## 2024-01-27 DIAGNOSIS — Z789 Other specified health status: Secondary | ICD-10-CM

## 2024-01-27 MED ORDER — SERTRALINE HCL 50 MG PO TABS
50.0000 mg | ORAL_TABLET | Freq: Every day | ORAL | 2 refills | Status: AC
  Filled 2024-01-27 – 2024-03-31 (×4): qty 90, 90d supply, fill #0
  Filled 2024-06-01 – 2024-07-05 (×2): qty 90, 90d supply, fill #1
  Filled 2024-10-03: qty 90, 90d supply, fill #2

## 2024-02-02 DIAGNOSIS — N1832 Chronic kidney disease, stage 3b: Secondary | ICD-10-CM | POA: Diagnosis not present

## 2024-02-02 DIAGNOSIS — D631 Anemia in chronic kidney disease: Secondary | ICD-10-CM | POA: Diagnosis not present

## 2024-02-02 DIAGNOSIS — E559 Vitamin D deficiency, unspecified: Secondary | ICD-10-CM | POA: Diagnosis not present

## 2024-02-02 DIAGNOSIS — N189 Chronic kidney disease, unspecified: Secondary | ICD-10-CM | POA: Diagnosis not present

## 2024-02-02 DIAGNOSIS — I129 Hypertensive chronic kidney disease with stage 1 through stage 4 chronic kidney disease, or unspecified chronic kidney disease: Secondary | ICD-10-CM | POA: Diagnosis not present

## 2024-02-02 DIAGNOSIS — E872 Acidosis, unspecified: Secondary | ICD-10-CM | POA: Diagnosis not present

## 2024-02-02 DIAGNOSIS — E875 Hyperkalemia: Secondary | ICD-10-CM | POA: Diagnosis not present

## 2024-02-06 NOTE — Progress Notes (Signed)
 See Procedure note.

## 2024-02-07 ENCOUNTER — Ambulatory Visit: Payer: Medicare Other | Admitting: Adult Health

## 2024-02-09 ENCOUNTER — Other Ambulatory Visit (HOSPITAL_COMMUNITY): Payer: Self-pay

## 2024-02-09 ENCOUNTER — Other Ambulatory Visit: Payer: Self-pay

## 2024-02-13 ENCOUNTER — Ambulatory Visit: Payer: Self-pay | Admitting: Neurology

## 2024-02-13 DIAGNOSIS — G4733 Obstructive sleep apnea (adult) (pediatric): Secondary | ICD-10-CM

## 2024-02-13 NOTE — Procedures (Signed)
 [facility]  HOME SLEEP TEST (SANSA) REPORT (Mail-Out Device):   STUDY DATE: 01/28/2024   DOB: 01-May-1946  MRN: [id number]  ORDERING CLINICIAN: Debbra Fairy, MD, PhD   REFERRING CLINICIAN: Windell Hasty, DO   CLINICAL INFORMATION/HISTORY: 78 year old-year-old with an underlying medical history of arthritis, AAA, coronary artery disease, chronic kidney disease, depression, reflux disease, hypertension, and obesity, who presents for reevaluation of his obstructive sleep apnea.  He has been on CPAP of 14 cm with EPR of 1, with good compliance but mildly elevated residual AHI.  He should be eligible for a new machine.  PATIENT'S LAST REPORTED EPWORTH SLEEPINESS SCORE (ESS): 3/24.  BMI (at the time of sleep clinic visit and/or test date): 31.5 kg/m  FINDINGS:   Study Protocol:    The SANSA single-point-of-skin-contact chest-worn sensor - an FDA cleared and DOT approved type 4 home sleep test device - measures eight physiological channels,  including blood oxygen saturation (measured via PPG [photoplethysmography]), EKG-derived heart rate, respiratory effort, chest movement (measured via accelerometer), snoring, body position, and actigraphy. The device is designed to be worn for up to 10 hours per study.   Sleep Summary:   Total Recording Time (hours, min): 9 hours, 57 min  Total Effective Sleep Time (hours, min):  7 hours, 24 min  Sleep Efficiency (%):    83%   Respiratory Indices:   Calculated sAHI (per hour):  26.2/hour         Oxygen Saturation Statistics:    Oxygen Saturation (%) Mean: 98%   Minimum oxygen saturation (%):                 73.2%   O2 Saturation Range (%): 73.2- 100%   Time below or at 88% saturation: 10 min   Pulse Rate Statistics:   Pulse Mean (bpm):    50/min    Pulse Range (45 -81/min)   Snoring: Mild to moderate  IMPRESSION/DIAGNOSES:   OSA (obstructive sleep apnea), moderate   RECOMMENDATIONS:   This home sleep  test demonstrates moderate obstructive sleep apnea with a total AHI of 26.2/hour and O2 nadir of 73.2%.  Mild to moderate snoring was detected.  Ongoing treatment with a positive airway pressure (PAP) device is recommended. The patient has been on home CPAP therapy at a pressure of 14 cm with EPR of 1.  He has had mildly elevated residual AHI numbers.  He should be eligible for a new machine.  I recommend home CPAP therapy at a pressure of 15 cm with EPR of 3, mask of choice, sized to fit.  A full night titration study may be considered to optimize treatment settings, monitor proper oxygen saturations and aid with improvement of tolerance and adherence, if needed down the road. Alternative treatment options may include a dental device through dentistry or orthodontics in selected patients or Inspire (hypoglossal nerve stimulator) in carefully selected patients (meeting inclusion criteria).  Concomitant weight loss is recommended (where clinically appropriate). Please note that untreated obstructive sleep apnea may carry additional perioperative morbidity. Patients with significant obstructive sleep apnea should receive perioperative PAP therapy and the surgeons and particularly the anesthesiologist should be informed of the diagnosis and the severity of the sleep disordered breathing. The patient should be cautioned not to drive, work at heights, or operate dangerous or heavy equipment when tired or sleepy. Review and reiteration of good sleep hygiene measures should be pursued with any patient. Other causes of the patient's symptoms, including circadian rhythm disturbances, an underlying mood disorder,  medication effect and/or an underlying medical problem cannot be ruled out based on this test. Clinical correlation is recommended.  The patient and his referring provider will be notified of the test results. The patient will be seen in follow up in sleep clinic at [facility].  I certify that I have reviewed the raw  data recording prior to the issuance of this report in accordance with the standards of the American Academy of Sleep Medicine (AASM).    INTERPRETING PHYSICIAN:   Debbra Fairy, MD, PhD Medical Director, [facility]) Diplomat, ABPN (Neurology and Sleep)   [facility] [address] [place], [place] [address] [phone]

## 2024-02-15 NOTE — Telephone Encounter (Addendum)
 I called pt and relayed the results of the sleep study that he had done.  Showed moderated OSA, she will write for a new machine and she suggests increasing pressure to 15cm.  Will see back in 3 months after he has started using the machine.  Pt will call when gets his machine. DME is [employer].  He mentioned that he did go for a mask refitting and they tried him on a different mask and he would like to go back to [facility].  (The order is for oro-nasal- which is essentially FFM).  He verbalized understanding of plan. Appreciated call back.   Community message sent to [employer].

## 2024-02-15 NOTE — Telephone Encounter (Signed)
-----   Message from Debbra Fairy sent at 02/13/2024  6:44 PM EDT ----- Patient has been on home CPAP therapy.  She should be eligible for new machine.  He had a home sleep test on 01/28/2024 which showed moderate obstructive sleep apnea.  Please advise patient that I would like to write for a new machine and I suggest we increase the pressure by 1 cm also, to 15 cm. Please arrange for follow-up in sleep clinic with the nurse practitioner or myself in about 3 months after set up date.  We can stay with the same DME company or establish with a new company if he prefers.  Please encourage full compliance with treatment.

## 2024-02-16 NOTE — Telephone Encounter (Signed)
 New, Fabiene Holt, RN; Ladene Pickle; Tucker, Dolanda; 1 other Order received, thank you!     Previous Messages    ----- Message ----- From: Viktoria Gray, RN Sent: 02/15/2024   3:31 PM EDT To: Osie Bleacher; Shirline Dover; Leana Printers; * Subject: new machine,  aslo would like to request tha*  Please see order in EPIC.  He had a mask refitting recently and got a mask, but likes the one he had before FFM (the one ordered on this is oro nasal- which is essentially a FFM), so you know. Thank you.    Joseph Maldonado. Male, 78 y.o., 05-19-1946 Pronouns: he/him/his MRN: [id number] Phone: [phone]  [name]

## 2024-02-27 ENCOUNTER — Other Ambulatory Visit: Payer: Self-pay

## 2024-02-27 ENCOUNTER — Other Ambulatory Visit (HOSPITAL_COMMUNITY): Payer: Self-pay

## 2024-03-03 ENCOUNTER — Other Ambulatory Visit (HOSPITAL_COMMUNITY): Payer: Self-pay

## 2024-03-03 MED ORDER — AMOXICILLIN 500 MG PO CAPS
500.0000 mg | ORAL_CAPSULE | Freq: Two times a day (BID) | ORAL | 0 refills | Status: AC
  Filled 2024-03-03: qty 6, 3d supply, fill #0

## 2024-03-07 ENCOUNTER — Telehealth: Payer: Self-pay | Admitting: Neurology

## 2024-03-07 NOTE — Telephone Encounter (Signed)
 Contacted patient; Informed patient had an appt 05/14/24 at 7:45 am. Patient said will have to call you back to schedule appt, my wife is having surgery in June and will have to stay with her.  Informed patient must be scheduled before 06/02/24, if not insurance will not cover CPAP machine you would have turn in cover the cost. Patient verbalized understand.

## 2024-03-07 NOTE — Telephone Encounter (Signed)
 Pt called  wanting to know if he needs to come to next appt . Pt was told once he receive Sleep study supplies he doesn't have to come back to office until 3 month. However Pt is unsure would like to speak to MD

## 2024-03-07 NOTE — Telephone Encounter (Signed)
 He is correct. Reviewed chart and airview. I see he was setup on a new cpap machine on 03/02/24. He will need his 6/18 appt moved to somewhere between 04/02/24 and 06/02/24. Please assist with rescheduling. He can see Jacqlyn Matas NP or Dr Omar Bibber. Thank you!

## 2024-03-09 ENCOUNTER — Other Ambulatory Visit (HOSPITAL_COMMUNITY): Payer: Self-pay

## 2024-03-09 ENCOUNTER — Other Ambulatory Visit: Payer: Self-pay

## 2024-03-10 ENCOUNTER — Other Ambulatory Visit (HOSPITAL_COMMUNITY): Payer: Self-pay

## 2024-03-12 ENCOUNTER — Ambulatory Visit: Admitting: Neurology

## 2024-03-12 ENCOUNTER — Other Ambulatory Visit: Payer: Self-pay

## 2024-03-13 ENCOUNTER — Other Ambulatory Visit: Payer: Self-pay | Admitting: *Deleted

## 2024-03-13 DIAGNOSIS — G4733 Obstructive sleep apnea (adult) (pediatric): Secondary | ICD-10-CM

## 2024-03-13 NOTE — Progress Notes (Signed)
 Recommend mask refit as leak from the mask is too high, we can try to reduce the pressure to 13 cm for now, will need his initial PAP follow-up within 30 to 90 days of set up.

## 2024-03-14 ENCOUNTER — Ambulatory Visit: Payer: Self-pay | Admitting: Neurology

## 2024-03-14 ENCOUNTER — Other Ambulatory Visit (HOSPITAL_COMMUNITY): Payer: Self-pay

## 2024-03-15 DIAGNOSIS — D1801 Hemangioma of skin and subcutaneous tissue: Secondary | ICD-10-CM | POA: Diagnosis not present

## 2024-03-15 DIAGNOSIS — L821 Other seborrheic keratosis: Secondary | ICD-10-CM | POA: Diagnosis not present

## 2024-03-15 DIAGNOSIS — L570 Actinic keratosis: Secondary | ICD-10-CM | POA: Diagnosis not present

## 2024-03-15 DIAGNOSIS — Z85828 Personal history of other malignant neoplasm of skin: Secondary | ICD-10-CM | POA: Diagnosis not present

## 2024-03-15 DIAGNOSIS — L281 Prurigo nodularis: Secondary | ICD-10-CM | POA: Diagnosis not present

## 2024-03-15 DIAGNOSIS — L814 Other melanin hyperpigmentation: Secondary | ICD-10-CM | POA: Diagnosis not present

## 2024-03-26 ENCOUNTER — Ambulatory Visit: Payer: Medicare Other | Admitting: Adult Health

## 2024-03-31 ENCOUNTER — Other Ambulatory Visit (HOSPITAL_COMMUNITY): Payer: Self-pay

## 2024-04-04 ENCOUNTER — Encounter: Payer: Self-pay | Admitting: Neurology

## 2024-04-04 DIAGNOSIS — G4733 Obstructive sleep apnea (adult) (pediatric): Secondary | ICD-10-CM

## 2024-04-04 NOTE — Telephone Encounter (Signed)
 If patient is agreeable, please increase the pressure to 14 cm, keep other parameters the same.  Please inquire if patient has been taking any new medications such as sedating medications including pain medications or muscle relaxers?  Has he had any weight fluctuation?

## 2024-04-09 DIAGNOSIS — D485 Neoplasm of uncertain behavior of skin: Secondary | ICD-10-CM | POA: Diagnosis not present

## 2024-04-09 DIAGNOSIS — L308 Other specified dermatitis: Secondary | ICD-10-CM | POA: Diagnosis not present

## 2024-04-09 DIAGNOSIS — Z85828 Personal history of other malignant neoplasm of skin: Secondary | ICD-10-CM | POA: Diagnosis not present

## 2024-04-11 NOTE — Addendum Note (Signed)
 Addended by: Emiline Mancebo on: 04/11/2024 08:06 AM   Modules accepted: Orders

## 2024-04-11 NOTE — Telephone Encounter (Signed)
 Order placed for set pressure of 14 cm.

## 2024-04-16 DIAGNOSIS — Z96653 Presence of artificial knee joint, bilateral: Secondary | ICD-10-CM | POA: Diagnosis not present

## 2024-04-16 DIAGNOSIS — M25561 Pain in right knee: Secondary | ICD-10-CM | POA: Diagnosis not present

## 2024-04-19 ENCOUNTER — Telehealth: Payer: Self-pay | Admitting: Neurology

## 2024-04-19 NOTE — Telephone Encounter (Signed)
 LVM and sent mychart msg informing pt of need to rescheduel 05/02/24 appt - MD out  If patient calls back I have a slot held for him with Harlene for 04/30/24 at 1:15

## 2024-04-20 ENCOUNTER — Other Ambulatory Visit (HOSPITAL_COMMUNITY): Payer: Self-pay

## 2024-04-20 MED ORDER — PREDNISONE 10 MG PO TABS
ORAL_TABLET | ORAL | 0 refills | Status: AC
  Filled 2024-04-20: qty 20, 8d supply, fill #0

## 2024-04-26 ENCOUNTER — Ambulatory Visit (HOSPITAL_COMMUNITY)
Admission: RE | Admit: 2024-04-26 | Discharge: 2024-04-26 | Disposition: A | Discharge status: Home / Self Care | Source: Ambulatory Visit | Attending: Cardiology | Admitting: Cardiology

## 2024-04-26 DIAGNOSIS — M25551 Pain in right hip: Secondary | ICD-10-CM | POA: Diagnosis not present

## 2024-04-26 DIAGNOSIS — I7121 Aneurysm of the ascending aorta, without rupture: Secondary | ICD-10-CM | POA: Insufficient documentation

## 2024-04-26 DIAGNOSIS — Z96641 Presence of right artificial hip joint: Secondary | ICD-10-CM | POA: Diagnosis not present

## 2024-04-26 LAB — ECHOCARDIOGRAM COMPLETE
AR max vel: 3.17 cm2
AV Area VTI: 2.89 cm2
AV Area mean vel: 2.97 cm2
AV Mean grad: 4 mmHg
AV Peak grad: 7.2 mmHg
Ao pk vel: 1.35 m/s
Area-P 1/2: 2.79 cm2
P 1/2 time: 1488 ms
S' Lateral: 2.9 cm

## 2024-04-27 ENCOUNTER — Ambulatory Visit: Payer: Self-pay | Admitting: Cardiology

## 2024-04-27 DIAGNOSIS — I7121 Aneurysm of the ascending aorta, without rupture: Secondary | ICD-10-CM

## 2024-04-27 NOTE — Progress Notes (Signed)
 Normal heart function. Ascending aorta is stable and perhaps upper limit normal for body surfacr area.  Repeat echocardiogram in 2 years.  Thanks MJP

## 2024-04-30 DIAGNOSIS — Z96641 Presence of right artificial hip joint: Secondary | ICD-10-CM | POA: Diagnosis not present

## 2024-04-30 DIAGNOSIS — M25551 Pain in right hip: Secondary | ICD-10-CM | POA: Diagnosis not present

## 2024-04-30 DIAGNOSIS — Z9189 Other specified personal risk factors, not elsewhere classified: Secondary | ICD-10-CM | POA: Diagnosis not present

## 2024-05-02 ENCOUNTER — Encounter: Admitting: Neurology

## 2024-05-08 ENCOUNTER — Encounter: Payer: Self-pay | Admitting: Neurology

## 2024-05-08 ENCOUNTER — Ambulatory Visit (INDEPENDENT_AMBULATORY_CARE_PROVIDER_SITE_OTHER): Admitting: Neurology

## 2024-05-08 VITALS — BP 124/67 | HR 50 | Ht 70.0 in | Wt 216.8 lb

## 2024-05-08 DIAGNOSIS — G4731 Primary central sleep apnea: Secondary | ICD-10-CM | POA: Diagnosis not present

## 2024-05-08 DIAGNOSIS — G4733 Obstructive sleep apnea (adult) (pediatric): Secondary | ICD-10-CM | POA: Diagnosis not present

## 2024-05-08 NOTE — Progress Notes (Signed)
 Subjective:    Patient ID: Joseph Maldonado. is a 78 y.o. male.  HPI    Interim history:   Joseph Maldonado is a 78 year old right-handed gentleman with an underlying medical history of arthritis, aortic aneurysm, coronary artery disease, depression, reflux disease, hypertension, chronic kidney disease, sleep apnea, obesity, status post multiple surgeries including cataract extractions, bilateral total knee replacements, bilateral reverse shoulder arthroplasties, right total hip replacement, who presents for follow-up consultation of his sleep apnea, on CPAP therapy with a new machine.  The patient is accompanied by his wife today.  I last saw him in April 2025, at which time we proceeded with a home sleep test for reevaluation of his sleep apnea.  He had a home sleep test through our office on 01/27/2024 which showed moderate obstructive sleep apnea with a total AHI of 26.2/hour and O2 nadir of 73.2%.  Mild to moderate snoring was detected.    I wrote for a new CPAP machine.  His set up date was 03/02/2024.  He has a ResMed air sense 11 AutoSet machine, DME company is [employer].  Today, 05/08/2024: I reviewed his CPAP compliance data from 04/07/2024 through 05/06/2024, which is a total of 30 days, during which time he used his machine every night with percent use days greater than 4 hours at 100%, indicating superb compliance with an average usage of 7 hours and 22 minutes, residual AHI elevated at 21/h on a pressure of 14 cm with EPR of 3, most events obstructive in nature, central event index 5.7/h.  Leak acceptable with a 95th percentile at 8.5 L/min.  He reports that he still tolerates treatment but is worried about his apnea scores being high still.  He noticed an improvement in his leak with his new mask.  His leak has indeed come down nicely since about April 13, 2024.  His wife notices still residual snoring and fairly long apneas that seem to come and clusters of 2 or 3.  Sometimes she just makes him  wake up when she notices a longer apnea.  He also twitches before falling asleep.  He is on sertraline .  He also takes Zanaflex  occasionally, maybe once every 2 weeks.  He is motivated to continue with treatment and would be willing to try an increased pressure.  The patient's allergies, current medications, family history, past medical history, past social history, past surgical history and problem list were reviewed and updated as appropriate.    Previously (copied from previous notes for reference):   01/04/2024: I reviewed his CPAP compliance data from 12/04/2023 through 01/02/2024, which is a total of 30 days, during which time he used his machine 24 days with percent use days greater than 4 hours at 70%, indicating adequate compliance, average usage of 6 hours and 10 minutes, residual AHI mildly elevated at 11.9/h with mostly obstructive events per download, set pressure of 14 cm with EPR of He reports full compliance with treatments, nevertheless, his residual AHI fluctuates and tends to be in the low double digits.  He has switched to a hybrid style fullface mask about 3 to 4 weeks ago which he can tolerate a lot better and the leak seems less for him.  He is eligible for a new machine, in fact, he has noticed water  accumulation underneath the machine.  He recently got a new fire unit but has been alternating the new and his old humidifier just for cleanliness.  His set up date was 11/08/2017 on this machine.  03/24/2023 Joseph Russell, Joseph Maldonado): <<Joseph Rayfield Beem. is a 78 y.o. male with a history of OSA on CPAP.SABRA Returns today for follow-up. Reports that CPAP is beneficial. Can tell a difference when he doesn't use it. His DL is below.>>     3/71/7976 Joseph Bogaert, Joseph Maldonado): <<Patient returns for 1 year CPAP follow-up.  Previously seen by Dr. Buck 02/04/2021.  He has had difficulty using machine for the past few months as his wife had a fall back in January and required shoulder surgery, he was up  frequently throughout the night caring for her. He has been trying to get back on track using his machine more consistently. Has been having issues with leaks - has mask adjustment this morning at [facility]. Occasionally has difficulty taking a deep breath, will have to remove mask. Needs order for new supplies. ESS 3/24. No further concerns. >>     02/04/2021: I reviewed his CPAP compliance data from 01/04/2021 through 02/02/2021, which is a total of 30 days, during which time he used his machine 29 days with percent use days greater than 4 hours at 83%, indicating very good compliance with an average usage of 6 hours and 31 minutes, residual AHI slightly borderline at 5.7/h, leak on the higher side with a 95th percentile at 30.9 mL/min, pressure of 13 cm with EPR of 1.  Leak numbers improved and that has 10 days.  He reports doing better recently.  He tried an AirTouch full facemask which did not work for him.  He switched back to his previous fullface mask and headgear and things are looking better, events are still fluctuating but have come down since the increase in pressure.  His wife from time to time notices an apnea and no significant snoring.  He is quite pleased with how things are going, however, has discomfort at night, some nights worse than others because of his shoulder arthritis.  He will eventually need a shoulder replacement but would like to hold off for now.  His range of motion is still fairly good he states, it is just painful.  He takes Tylenol  1 g twice daily on a day-to-day basis.  He has regular follow-up with his cardiologist for his aortic aneurysm.  He typically gets an echocardiogram every other year now.  He gets regular labs through his primary care physician.  He does have chronic kidney impairment. I saw him on 09/10/20, at which time he was compliant with his CPAP and residual AHI was at goal.  He was advised to follow-up in 1 year.  He called in the interim in late March reporting  that his wife had noted increased apneas while he was on CPAP.  A compliance review indicated a borderline residual AHI of 6.1/h.  He was advised to continue with CPAP of 12 cm but requested additional review as he had noticed increase in events on his machine, I therefore increased his set pressure to 13 cm.     I saw him in a virtual visit on 04/05/2019, at which time he was compliant with his CPAP and doing well.  He was advised to follow-up in 1 year.   I reviewed his CPAP compliance data from 08/11/2020 through 09/09/2020, which is a total of 30 days, during which time he used his machine every night with percent use days greater than 4 hours at 80%, indicating very good compliance with an average usage of 5 hours and 9 minutes, residual AHI at goal at 3.8/h, leak on  the high side with a 95th percentile at 24.6 L/min and a pressure of 12 cm with EPR of 1.    I saw him on 10/05/2018, at which time we talked about his sleep study results, he was compliant with CPAP and doing well, he felt improved with regards to his daytime energy level and nighttime sleep consolidation and sleep quality.  He had undergone right total hip replacement in November 2018 and had to cancel an interim follow-up appointment because of his surgery and also his dog needed surgery.  He had lost weight over time and was able to keep it off.  He was using a fullface mask but had tried another type of mask in the interim.     I reviewed his CPAP compliance data from 03/05/2019 through 04/03/2019 which is a total of 30 days, during which time he used his machine every night with percent use days greater than 4 hours at 90%, indicating excellent compliance with an average usage of 6 hours and 53 minutes, residual AHI borderline at 4.8/h, leak on the higher end with a 95th percentile at 19.9 L/min on a pressure of 12 cm with EPR of 1.       I first met him on 04/06/2018 at the request of his primary care physician, at which time he reported  snoring and daytime somnolence. He was advised to proceed with a sleep study. He had a split-night sleep study on 05/22/2018. I went over his test results with him in detail today. Baseline sleep latency was delayed at 91.5 minutes and REM sleep was absent. He had a total AHI of 52.2 per hour, average oxygen saturation was 96%, nadir was 78%. He had no significant PLMS, no significant EKG changes. He was fitted with a large full facemask due to being a mouth breather and CPAP was titrated from 5 cm to 12 cm. On the final pressure his AHI was 0 per hour, with supine non-REM sleep achieved an O2 nadir of 95%. Based on his test results I prescribed CPAP therapy for home use at a pressure of 12 cm.      I reviewed his CPAP compliance data from 09/04/2018 through 10/03/2018 which is a total of 30 days, during which time he used his CPAP every night with percent used days greater than 4 hours at 97%, indicating excellent compliance with an average usage of 7 hours and 17 minutes, residual AHI at goal at 3.8 per hour, leak on the higher end with the 95th percentile at 23.7 L/m on a pressure of 12 cm with EPR of 1.    04/06/2018: (He) reports snoring and excessive daytime somnolence. His wife is noted pauses in his breathing while asleep. He saw Dr. Ines last year after a fall. I reviewed your office note from 01/24/2018 as well as 03/13/2018. He recently saw pulmonology for his chronic cough. His Epworth sleepiness score is 3 out of 24, fatigue score is 32 out of 63. He denies a family history of sleep apnea. He reports a family history of brain aneurysm in his mother and brain cancer in his father. He would like to have a brain MRI done but cannot have contrast because of chronic kidney disease. For his chronic cough he was recently started on medication by pulmonology including anti-histamine and reflux medicine. His wife reports that there are some nights where he snores very little and has no apneas but there are  some other nights where he has louder snoring  and breathing pauses as well as gasping sounds. He has nocturia about 2-3 times per average night and denies morning headaches. 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 They have 2 grown children, a daughter and a son. Wife reports that his family and she have noticed changes in his behavior and low frustration tolerance recently. Of note, patient as his intolerance is provoked. He quit smoking in 1970. He drinks less than 2 beers per week and has not had any alcohol  in several weeks. He drinks caffeine in the form of 2 large cups per day in the mornings. They have 2 dogs and 1 cat, dogs are often in the bedroom at night. His bedtime is between 10 and 11 PM, rise time around 7 or 7:30. He had a uvulectomy when he was in his 30s for snoring and he had a tonsillectomy as a child. He typically sleeps on his back because of bilateral shoulder pain. He had his left hip replaced as well as both knees.   His Past Medical History Is Significant For: Past Medical History:  Diagnosis Date   Arthritis    Ascending aortic aneurysm (HCC)    CAD (coronary artery disease)    Chronic kidney disease    Depression    Dysrhythmia    occasional Palps   GERD (gastroesophageal reflux disease)    Hand pain    currently being evaluated by rheumatology for dx    Hematuria    Hypertension    Rapid heart beat    rpeorts   2 months ago i had a rapid heart beat that lasted 45 min to an hour, ive experience this twice in the last t10 years but only seconds long  i was evaluated by cardiology Dr Wilmer who did a 30 day monitor and ECHO and said i have occasional [PATS] ? , denies chest pain nor LOC  during these episodes; HR today is WDL    Sleep apnea    CPAP     His Past Surgical History Is Significant For: Past Surgical History:  Procedure Laterality Date   CATARACT EXTRACTION, BILATERAL     HIP SURGERY Left 2012   JOINT REPLACEMENT     REPLACEMENT  TOTAL KNEE Right    2001,2006,2009,2013   REPLACEMENT TOTAL KNEE Left 2011   REVERSE SHOULDER ARTHROPLASTY Right 01/20/2023   Procedure: REVERSE SHOULDER ARTHROPLASTY;  Surgeon: Cristy Bonner DASEN, MD;  Location: [facility];  Service: Orthopedics;  Laterality: Right;   REVERSE SHOULDER ARTHROPLASTY Left 10/19/2023   Procedure: REVERSE SHOULDER ARTHROPLASTY;  Surgeon: Cristy Bonner DASEN, MD;  Location: [facility];  Service: Orthopedics;  Laterality: Left;   TOTAL HIP ARTHROPLASTY Right 08/14/2018   Procedure: TOTAL HIP ARTHROPLASTY ANTERIOR APPROACH;  Surgeon: Liam Lerner, MD;  Location: [facility];  Service: Orthopedics;  Laterality: Right;    His Family History Is Significant For: Family History  Problem Relation Age of Onset   Cancer Mother        throat   Hypertension Mother    Heart disease Mother    Emphysema Mother    Hypertension Father    Cancer Father        Brain   Hypertension Sister    Hematuria Sister    Hematuria Brother    Alzheimer's disease Neg Hx    Dementia Neg Hx     His Social History Is Significant For: Social History   Socioeconomic History   Marital status: Married  Spouse name: Not on file   Number of children: 2   Years of education: Not on file   Highest education level: Bachelor's degree (e.g., BA, AB, BS)  Occupational History   Occupation: Retired  Tobacco Use   Smoking status: Former    Current packs/day: 0.00    Average packs/day: 0.5 packs/day for 3.0 years (1.5 ttl pk-yrs)    Types: Cigarettes    Start date: 1968    Quit date: 1971    Years since quitting: 54.6   Smokeless tobacco: Never  Vaping Use   Vaping status: Never Used  Substance and Sexual Activity   Alcohol  use: Yes    Alcohol /week: 3.0 standard drinks of alcohol     Types: 3 Cans of beer per week    Comment: rarely   Drug use: No    Comment: former user of marijuana, quit in 1971   Sexual activity: Yes  Other Topics Concern   Not on file  Social History Narrative    Lives with his wife.   Education: College   Exercise: No   Drinks about 2 large coffees per day   Right handed      Spent many years in [place] .  Moved to [place] to be near their children.  Daughter lives in [place].  Their son has lived in the [place] area with plans to move to [place] in summer 2019.   Social Drivers of Corporate investment banker Strain: Low Risk  (08/08/2017)   Overall Financial Resource Strain (CARDIA)    Difficulty of Paying Living Expenses: Not hard at all  Food Insecurity: No Food Insecurity (10/19/2023)   Hunger Vital Sign    Worried About Running Out of Food in the Last Year: Never true    Ran Out of Food in the Last Year: Never true  Transportation Needs: No Transportation Needs (10/19/2023)   PRAPARE - Administrator, Civil Service (Medical): No    Lack of Transportation (Non-Medical): No  Physical Activity: Inactive (08/08/2017)   Exercise Vital Sign    Days of Exercise per Week: 0 days    Minutes of Exercise per Session: 0 min  Stress: No Stress Concern Present (08/08/2017)   [employer] of Occupational Health - Occupational Stress Questionnaire    Feeling of Stress : Not at all  Social Connections: Socially Integrated (10/19/2023)   Social Connection and Isolation Panel    Frequency of Communication with Friends and Family: More than three times a week    Frequency of Social Gatherings with Friends and Family: Once a week    Attends Religious Services: More than 4 times per year    Active Member of [employer] or Organizations: Yes    Attends Engineer, structural: More than 4 times per year    Marital Status: Married    His Allergies Are:  Allergies  Allergen Reactions   Nsaids     Affects creatinine   Dilaudid [Hydromorphone Hcl] Other (See Comments)    Suicidal   Sulfa Antibiotics     Other reaction(s): GI Intolerance  :   His Current Medications Are:  Outpatient Encounter Medications as of 05/08/2024   Medication Sig   amoxicillin  (AMOXIL ) 500 MG capsule Take 1 capsule (500 mg total) by mouth 2 (two) times daily.   atenolol  (TENORMIN ) 25 MG tablet Take 1 tablet (25 mg total) by mouth daily.   atorvastatin  (LIPITOR) 20 MG tablet Take 1 tablet (20 mg total) by mouth daily. (  Patient taking differently: Take 20 mg by mouth every evening.)   buPROPion  ER (WELLBUTRIN  SR) 100 MG 12 hr tablet Take 1 tablet by mouth once daily   buPROPion  ER (WELLBUTRIN  SR) 100 MG 12 hr tablet Take 1 tablet (100 mg total) by mouth 2 (two) times daily.   cholecalciferol (VITAMIN D3) 25 MCG (1000 UNIT) tablet Take 1,000 Units by mouth in the morning.   losartan  (COZAAR ) 100 MG tablet Take 1 tablet (100 mg total) by mouth daily.   methocarbamol  (ROBAXIN ) 500 MG tablet Take 1 tablet (500 mg total) by mouth every 8 (eight) hours as needed for muscle spasms.   Multiple Vitamin (MULTIVITAMIN WITH MINERALS) TABS tablet Take 1 tablet by mouth every evening.   mupirocin  ointment (BACTROBAN ) 2 % Apply a small amount twice a day intranasally for 14 days.   pantoprazole  (PROTONIX ) 20 MG tablet Take 1 tablet (20 mg total) by mouth daily.   sertraline  (ZOLOFT ) 50 MG tablet Take 1 tablet (50 mg total) by mouth daily.   sodium bicarbonate  650 MG tablet Take 1 tablet (650 mg total) by mouth 2 (two) times daily.   tizanidine  (ZANAFLEX ) 2 MG capsule Take 2 mg by mouth at bedtime as needed (stiffness/sleep.).   acetic acid  2 % otic solution Place 5 drops into the affected ear twice daily. (Patient not taking: Reported on 05/08/2024)   gabapentin  (NEURONTIN ) 100 MG capsule Take 1 capsule (100 mg total) by mouth 3 (three) times daily. For pain (Patient not taking: Reported on 05/08/2024)   methylPREDNISolone  (MEDROL ) 4 MG TBPK tablet Take as prescribed on dose pack over 6 days (Patient not taking: Reported on 05/08/2024)   predniSONE  (DELTASONE ) 10 MG tablet Take 4 tablets by mouth once daily for 2 days THEN take 3 tablets once daily for 2 days  THEN take 2 tablets daily for 2 days THEN take 1 tablet daily for 2 days (Patient not taking: Reported on 05/08/2024)   predniSONE  (STERAPRED UNI-PAK 21 TAB) 5 MG (21) TBPK tablet Take 6 tablets (60 mg total) by mouth on day 1, then 5 tablets (50 mg total) on day 2, then 4 tablets (40 mg total) on day 3, then 3 tablets (30 mg total) on day 4, then 2 tablets (20 mg total) on day 5, and then 1 tablet (10 mg total) on day 6. (Patient not taking: Reported on 05/08/2024)   sertraline  (ZOLOFT ) 50 MG tablet Take 0.5 tablets (25 mg total) by mouth daily for 14 days, THEN 1 tablet (50 mg total) daily .   No facility-administered encounter medications on file as of 05/08/2024.  :  Review of Systems:  Out of a complete 14 point review of systems, all are reviewed and negative with the exception of these symptoms as listed below:  Review of Systems  Neurological:        Cpap followup, increased pressure 14, is better (FFM back of head headgear).  Duration long, gasps.  Wife will wake him to breathe.  ESS 5.       Objective:  Neurological Exam  Physical Exam Physical Examination:   Vitals:   05/08/24 1335  BP: 124/67  Pulse: (!) 50    General Examination: The patient is a very pleasant 78 y.o. male in no acute distress. He appears well-developed and well-nourished and well groomed.   HEENT: Normocephalic, atraumatic, pupils are equal, round and reactive to light, extraocular tracking is well preserved, hearing is grossly intact, he has bilateral hearing aids in place. Face  is symmetric with normal facial animation, speech is clear without dysarthria, hypophonia or voice tremor. No carotid bruits. Airway examination reveals mild mouth dryness, otherwise stable findings.  Tongue protrudes centrally and palate elevates symmetrically.    Chest: Clear to auscultation without wheezing, rhonchi or crackles noted.   Heart: S1+S2+0, regular and normal without murmurs, rubs or gallops noted.    Abdomen:  Soft, non-tender and non-distended.   Extremities: There is no obvious swelling in the distal lower extremities bilaterally.   Skin: Warm and dry without trophic changes noted.    Musculoskeletal: exam reveals no obvious change. Right leg shorter than left, unremarkable knee replacement scars.  Unequal hip height noted, slight upper body tilt to the right, all stable. Mild decrease in ROM in shoulders.     Neurologically:  Mental status: The patient is awake, alert and oriented in all 4 spheres. His immediate and remote memory, attention, language skills and fund of knowledge are appropriate. There is no evidence of aphasia, agnosia, apraxia or anomia. Speech is clear with normal prosody and enunciation. Thought process is linear. Mood is normal and affect is blunted.  Cranial nerves II - XII are as described above under HEENT exam.  Motor exam: Normal bulk, strength and tone is noted. There is tremor. Fine motor skills are grossly intact. Cerebellar testing shows no dysmetria or intention tremor.  No gait ataxia. Sensory exam: intact to light touch.   Gait, station and balance: He stands with mild difficulty. Posture is age-appropriate with slight upper body tilt to the right, slight limp on the right. He has no walking aid.  He walks slowly.   Assessment and Plan:    In summary, Joseph Maldonado. is a very pleasant 78 year old male with an underlying medical history of arthritis, with status post multiple joint replacement surgeries, chronic kidney disease, depression, hypertension, reflux disease, chronic cough, cognitive complaints (followed by Dr. Ines and Duwaine Russell, Joseph Maldonado) and overweight state, who presents for follow-up consultation of his obstructive sleep apnea, well-established on CPAP therapy.  He recently started treatment with a new CPAP in June 2025, had a home sleep test in May 2025.  He has residual sleep disordered breathing on a set pressure of 14 cm, some of his events are  central in nature, could be secondary to medication effect.  We mutually agreed to increase his set pressure to 15 cm, reduce ramp time to 10 cm, increase ramp start from 6 to 8 cm.  He is advised to get in touch via MyChart message in about 6 weeks so we can look at another download.  He is advised to keep his follow-up appointment with Harlene Maldonado in about 6 to 8 months as previously scheduled and we may consider bringing him in for a proper titration study next if he still has residual sleep disordered breathing on a set pressure of 15 cm.  I answered all their questions today and the patient and his wife are in agreement.   Of note, we previously increased his pressure from 12 cm to 13 cm in March 2022 after he noted an increase in events.  He had a split-night sleep study through our sleep lab on 05/22/2018, which showed severe obstructive sleep apnea with a baseline AHI of 52.2/h, O2 nadir 78%.   I spent 40 minutes in total face-to-face time and in reviewing records during pre-charting, more than 50% of which was spent in counseling and coordination of care, reviewing test results, reviewing medications and  treatment regimen and/or in discussing or reviewing the diagnosis of OSA, CSA, the prognosis and treatment options. Pertinent laboratory and imaging test results that were available during this visit with the patient were reviewed by me and considered in my medical decision making (see chart for details).

## 2024-05-08 NOTE — Patient Instructions (Signed)
 We will increase your set pressure from 14 to 15 cm.  We will reduce your ramp time from currently 20 minutes to 10 minutes.  We will increase your ramp start from 6 cm to 8 cm at this time.  Please email us  through MyChart in about 6 weeks so we can look at another compliance download on the new settings.  We may consider bringing you in for a proper CPAP titration study in our sleep lab.  For now, please keep your appointment with us  as scheduled for April 2026.

## 2024-05-10 NOTE — Progress Notes (Signed)
 RE: cpap settings changed Received: Today New, Adine Neysa Nena GORMAN, RN; New, Bradley; Cain, Mitchell; Tucker, Dolanda; Ziegler, Melissa; 1 other Received, Thank you!     Previous Messages    ----- Message ----- From: Neysa Nena GORMAN, RN Sent: 05/08/2024   2:32 PM EDT To: Adine Leer; Avelina Sprung; Ephraim Dollar* Subject: cpap settings changed                          New order in EPIC  Changes made in airview.  Lamar Odie Raddle. Male, 78 y.o., 1946/01/06 Pronouns: he/him/his MRN: [id number]   Sandy RN

## 2024-05-23 ENCOUNTER — Encounter: Admitting: Neurology

## 2024-05-23 DIAGNOSIS — R262 Difficulty in walking, not elsewhere classified: Secondary | ICD-10-CM | POA: Diagnosis not present

## 2024-05-23 DIAGNOSIS — Z125 Encounter for screening for malignant neoplasm of prostate: Secondary | ICD-10-CM | POA: Diagnosis not present

## 2024-05-23 DIAGNOSIS — E559 Vitamin D deficiency, unspecified: Secondary | ICD-10-CM | POA: Diagnosis not present

## 2024-05-23 DIAGNOSIS — I129 Hypertensive chronic kidney disease with stage 1 through stage 4 chronic kidney disease, or unspecified chronic kidney disease: Secondary | ICD-10-CM | POA: Diagnosis not present

## 2024-05-23 DIAGNOSIS — E785 Hyperlipidemia, unspecified: Secondary | ICD-10-CM | POA: Diagnosis not present

## 2024-05-23 DIAGNOSIS — Z1389 Encounter for screening for other disorder: Secondary | ICD-10-CM | POA: Diagnosis not present

## 2024-05-23 DIAGNOSIS — M79651 Pain in right thigh: Secondary | ICD-10-CM | POA: Diagnosis not present

## 2024-05-23 DIAGNOSIS — I25118 Atherosclerotic heart disease of native coronary artery with other forms of angina pectoris: Secondary | ICD-10-CM | POA: Diagnosis not present

## 2024-05-23 DIAGNOSIS — N1831 Chronic kidney disease, stage 3a: Secondary | ICD-10-CM | POA: Diagnosis not present

## 2024-05-29 DIAGNOSIS — M79651 Pain in right thigh: Secondary | ICD-10-CM | POA: Diagnosis not present

## 2024-05-29 DIAGNOSIS — R262 Difficulty in walking, not elsewhere classified: Secondary | ICD-10-CM | POA: Diagnosis not present

## 2024-05-30 DIAGNOSIS — E785 Hyperlipidemia, unspecified: Secondary | ICD-10-CM | POA: Diagnosis not present

## 2024-05-30 DIAGNOSIS — R454 Irritability and anger: Secondary | ICD-10-CM | POA: Diagnosis not present

## 2024-05-30 DIAGNOSIS — R413 Other amnesia: Secondary | ICD-10-CM | POA: Diagnosis not present

## 2024-05-30 DIAGNOSIS — S22000A Wedge compression fracture of unspecified thoracic vertebra, initial encounter for closed fracture: Secondary | ICD-10-CM | POA: Diagnosis not present

## 2024-05-30 DIAGNOSIS — Z0000 Encounter for general adult medical examination without abnormal findings: Secondary | ICD-10-CM | POA: Diagnosis not present

## 2024-05-30 DIAGNOSIS — Z1389 Encounter for screening for other disorder: Secondary | ICD-10-CM | POA: Diagnosis not present

## 2024-05-30 DIAGNOSIS — M1990 Unspecified osteoarthritis, unspecified site: Secondary | ICD-10-CM | POA: Diagnosis not present

## 2024-05-30 DIAGNOSIS — I10 Essential (primary) hypertension: Secondary | ICD-10-CM | POA: Diagnosis not present

## 2024-05-30 DIAGNOSIS — R82998 Other abnormal findings in urine: Secondary | ICD-10-CM | POA: Diagnosis not present

## 2024-05-30 DIAGNOSIS — Z23 Encounter for immunization: Secondary | ICD-10-CM | POA: Diagnosis not present

## 2024-05-30 DIAGNOSIS — I7121 Aneurysm of the ascending aorta, without rupture: Secondary | ICD-10-CM | POA: Diagnosis not present

## 2024-05-30 DIAGNOSIS — M85821 Other specified disorders of bone density and structure, right upper arm: Secondary | ICD-10-CM | POA: Diagnosis not present

## 2024-05-30 DIAGNOSIS — R053 Chronic cough: Secondary | ICD-10-CM | POA: Diagnosis not present

## 2024-05-30 DIAGNOSIS — N1831 Chronic kidney disease, stage 3a: Secondary | ICD-10-CM | POA: Diagnosis not present

## 2024-05-30 DIAGNOSIS — Z1331 Encounter for screening for depression: Secondary | ICD-10-CM | POA: Diagnosis not present

## 2024-05-30 DIAGNOSIS — R222 Localized swelling, mass and lump, trunk: Secondary | ICD-10-CM | POA: Diagnosis not present

## 2024-06-01 ENCOUNTER — Other Ambulatory Visit: Payer: Self-pay

## 2024-06-01 ENCOUNTER — Other Ambulatory Visit: Payer: Self-pay | Admitting: Internal Medicine

## 2024-06-01 ENCOUNTER — Other Ambulatory Visit (HOSPITAL_COMMUNITY): Payer: Self-pay

## 2024-06-01 DIAGNOSIS — R222 Localized swelling, mass and lump, trunk: Secondary | ICD-10-CM

## 2024-06-01 MED ORDER — BUPROPION HCL ER (SR) 100 MG PO TB12
100.0000 mg | ORAL_TABLET | Freq: Two times a day (BID) | ORAL | 5 refills | Status: AC
  Filled 2024-06-01: qty 60, 30d supply, fill #0
  Filled 2024-07-10: qty 60, 30d supply, fill #1
  Filled 2024-08-13: qty 60, 30d supply, fill #2
  Filled 2024-09-13: qty 60, 30d supply, fill #3
  Filled 2024-10-03 – 2024-10-13 (×2): qty 60, 30d supply, fill #4

## 2024-06-01 MED ORDER — ATORVASTATIN CALCIUM 20 MG PO TABS
20.0000 mg | ORAL_TABLET | Freq: Every day | ORAL | 3 refills | Status: AC
  Filled 2024-06-01: qty 90, 90d supply, fill #0
  Filled 2024-08-28: qty 90, 90d supply, fill #1

## 2024-06-04 ENCOUNTER — Ambulatory Visit
Admission: RE | Admit: 2024-06-04 | Discharge: 2024-06-04 | Disposition: A | Discharge status: Home / Self Care | Source: Ambulatory Visit | Attending: Internal Medicine

## 2024-06-04 DIAGNOSIS — R222 Localized swelling, mass and lump, trunk: Secondary | ICD-10-CM

## 2024-06-04 DIAGNOSIS — R1031 Right lower quadrant pain: Secondary | ICD-10-CM | POA: Diagnosis not present

## 2024-06-06 DIAGNOSIS — R262 Difficulty in walking, not elsewhere classified: Secondary | ICD-10-CM | POA: Diagnosis not present

## 2024-06-06 DIAGNOSIS — M79651 Pain in right thigh: Secondary | ICD-10-CM | POA: Diagnosis not present

## 2024-06-09 ENCOUNTER — Other Ambulatory Visit (HOSPITAL_COMMUNITY): Payer: Self-pay

## 2024-06-11 ENCOUNTER — Other Ambulatory Visit (HOSPITAL_COMMUNITY): Payer: Self-pay

## 2024-06-11 MED ORDER — SODIUM BICARBONATE 650 MG PO TABS
650.0000 mg | ORAL_TABLET | Freq: Two times a day (BID) | ORAL | 3 refills | Status: AC
  Filled 2024-06-11: qty 120, 60d supply, fill #0
  Filled 2024-08-13: qty 120, 60d supply, fill #1
  Filled 2024-10-03: qty 120, 60d supply, fill #2

## 2024-06-13 DIAGNOSIS — M79651 Pain in right thigh: Secondary | ICD-10-CM | POA: Diagnosis not present

## 2024-06-13 DIAGNOSIS — R262 Difficulty in walking, not elsewhere classified: Secondary | ICD-10-CM | POA: Diagnosis not present

## 2024-06-14 ENCOUNTER — Other Ambulatory Visit (HOSPITAL_COMMUNITY): Payer: Self-pay

## 2024-06-14 DIAGNOSIS — Z23 Encounter for immunization: Secondary | ICD-10-CM | POA: Diagnosis not present

## 2024-06-14 MED ORDER — COVID-19 MRNA VAC-TRIS(PFIZER) 30 MCG/0.3ML IM SUSY
0.3000 mL | PREFILLED_SYRINGE | Freq: Once | INTRAMUSCULAR | 0 refills | Status: AC
  Filled 2024-06-14: qty 0.3, 1d supply, fill #0

## 2024-06-17 ENCOUNTER — Encounter: Payer: Self-pay | Admitting: Neurology

## 2024-06-17 DIAGNOSIS — G4734 Idiopathic sleep related nonobstructive alveolar hypoventilation: Secondary | ICD-10-CM

## 2024-06-17 DIAGNOSIS — Z789 Other specified health status: Secondary | ICD-10-CM

## 2024-06-17 DIAGNOSIS — G4733 Obstructive sleep apnea (adult) (pediatric): Secondary | ICD-10-CM

## 2024-06-18 NOTE — Telephone Encounter (Signed)
 Please review DL below .

## 2024-06-18 NOTE — Telephone Encounter (Signed)
 I will order a PAP titration study.

## 2024-06-20 DIAGNOSIS — R262 Difficulty in walking, not elsewhere classified: Secondary | ICD-10-CM | POA: Diagnosis not present

## 2024-06-20 DIAGNOSIS — M79651 Pain in right thigh: Secondary | ICD-10-CM | POA: Diagnosis not present

## 2024-06-28 ENCOUNTER — Telehealth: Payer: Self-pay | Admitting: Neurology

## 2024-06-28 NOTE — Telephone Encounter (Signed)
 CPAP Medicare no auth req/BCBS no auth req spoke to [place] ref # [id number].  Patient is scheduled at [facility] for 07/31/24 at 9 pm.  Mailed packet and sent mychart

## 2024-07-16 DIAGNOSIS — R2689 Other abnormalities of gait and mobility: Secondary | ICD-10-CM | POA: Diagnosis not present

## 2024-07-16 DIAGNOSIS — R269 Unspecified abnormalities of gait and mobility: Secondary | ICD-10-CM | POA: Diagnosis not present

## 2024-07-16 DIAGNOSIS — Z9181 History of falling: Secondary | ICD-10-CM | POA: Diagnosis not present

## 2024-07-21 ENCOUNTER — Other Ambulatory Visit (HOSPITAL_COMMUNITY): Payer: Self-pay

## 2024-07-25 DIAGNOSIS — R269 Unspecified abnormalities of gait and mobility: Secondary | ICD-10-CM | POA: Diagnosis not present

## 2024-07-25 DIAGNOSIS — Z9181 History of falling: Secondary | ICD-10-CM | POA: Diagnosis not present

## 2024-07-25 DIAGNOSIS — R2689 Other abnormalities of gait and mobility: Secondary | ICD-10-CM | POA: Diagnosis not present

## 2024-07-31 ENCOUNTER — Ambulatory Visit (INDEPENDENT_AMBULATORY_CARE_PROVIDER_SITE_OTHER): Admitting: Neurology

## 2024-07-31 DIAGNOSIS — G4733 Obstructive sleep apnea (adult) (pediatric): Secondary | ICD-10-CM | POA: Diagnosis not present

## 2024-07-31 DIAGNOSIS — Z789 Other specified health status: Secondary | ICD-10-CM

## 2024-07-31 DIAGNOSIS — G4720 Circadian rhythm sleep disorder, unspecified type: Secondary | ICD-10-CM

## 2024-07-31 DIAGNOSIS — G4734 Idiopathic sleep related nonobstructive alveolar hypoventilation: Secondary | ICD-10-CM

## 2024-08-01 DIAGNOSIS — Z9181 History of falling: Secondary | ICD-10-CM | POA: Diagnosis not present

## 2024-08-01 DIAGNOSIS — R269 Unspecified abnormalities of gait and mobility: Secondary | ICD-10-CM | POA: Diagnosis not present

## 2024-08-01 DIAGNOSIS — R2689 Other abnormalities of gait and mobility: Secondary | ICD-10-CM | POA: Diagnosis not present

## 2024-08-03 NOTE — Procedures (Signed)
 Physician Interpretation: Please see link under Procedure Tab or under Encounters tab for physician report, technical report, as well as O2 titration and/or PAP titration tables (if applicable).

## 2024-08-07 ENCOUNTER — Ambulatory Visit: Payer: Self-pay | Admitting: Neurology

## 2024-08-07 DIAGNOSIS — G4733 Obstructive sleep apnea (adult) (pediatric): Secondary | ICD-10-CM

## 2024-08-08 DIAGNOSIS — R2689 Other abnormalities of gait and mobility: Secondary | ICD-10-CM | POA: Diagnosis not present

## 2024-08-08 DIAGNOSIS — Z9181 History of falling: Secondary | ICD-10-CM | POA: Diagnosis not present

## 2024-08-08 DIAGNOSIS — R269 Unspecified abnormalities of gait and mobility: Secondary | ICD-10-CM | POA: Diagnosis not present

## 2024-08-08 NOTE — Telephone Encounter (Signed)
 Pt has called asking to schedule a f/u so he can be switched to a bipap.  Phone rep reached out to Meagan A who said she would like to speak with pt before any appointment is made.  This was relayed to pt, he will wait to hear from her.

## 2024-08-14 ENCOUNTER — Encounter: Payer: Self-pay | Admitting: Adult Health

## 2024-08-14 NOTE — Telephone Encounter (Signed)
   Rec'd response from [employer] at 1:03pm

## 2024-08-14 NOTE — Telephone Encounter (Signed)
-----   Message from True Mar sent at 08/07/2024  6:17 PM EST ----- Pt had a titration study to help optimize his OSA Rx. He has been on home CPAP. I recommend BiPAP of 20/16 cm based on his study from 07/31/24. Pls advise patient that I have placed an order for a  different machine and we will have his DME company set him up with a BiPAP machine. He will need a FU with NP or myself in about 3 months. ----- Message ----- From: Interface, Scanned Link In One Three One Sent: 08/07/2024   6:14 PM EST To: True Mar, MD

## 2024-08-14 NOTE — Telephone Encounter (Signed)
 I called pt. I advised pt that Dr. Buck reviewed their sleep study results and found that pt had a titration study to help optimize his OSA Rx. . Dr. Buck recommends that pt discontinue use of CPAP and he will be started on a BiPAP. I reviewed PAP compliance expectations with the pt. Pt is agreeable to switching to BiPAP. I advised pt that an order will be sent to a DME, [employer], and they will call the pt within about one week after they file with the pt's insurance. [employer] will show the pt how to use the machine, fit for masks, and troubleshoot the CPAP if needed. A follow up appt has not been made yet. Pt was asked to call me back when he is setup with new machine.  Pt verbalized understanding of results. Pt had no questions at this time but was encouraged to call back if questions arise.   I have sent the order to [employer].

## 2024-08-15 DIAGNOSIS — R2689 Other abnormalities of gait and mobility: Secondary | ICD-10-CM | POA: Diagnosis not present

## 2024-08-15 DIAGNOSIS — Z9181 History of falling: Secondary | ICD-10-CM | POA: Diagnosis not present

## 2024-08-15 DIAGNOSIS — R269 Unspecified abnormalities of gait and mobility: Secondary | ICD-10-CM | POA: Diagnosis not present

## 2024-08-24 ENCOUNTER — Other Ambulatory Visit (HOSPITAL_COMMUNITY): Payer: Self-pay

## 2024-08-27 ENCOUNTER — Other Ambulatory Visit (HOSPITAL_COMMUNITY): Payer: Self-pay

## 2024-08-27 MED ORDER — ATENOLOL 25 MG PO TABS
25.0000 mg | ORAL_TABLET | Freq: Every day | ORAL | 2 refills | Status: AC
  Filled 2024-08-27: qty 90, 90d supply, fill #0

## 2024-08-28 DIAGNOSIS — S0031XA Abrasion of nose, initial encounter: Secondary | ICD-10-CM | POA: Diagnosis not present

## 2024-08-28 DIAGNOSIS — D2371 Other benign neoplasm of skin of right lower limb, including hip: Secondary | ICD-10-CM | POA: Diagnosis not present

## 2024-08-28 DIAGNOSIS — L570 Actinic keratosis: Secondary | ICD-10-CM | POA: Diagnosis not present

## 2024-08-28 DIAGNOSIS — D225 Melanocytic nevi of trunk: Secondary | ICD-10-CM | POA: Diagnosis not present

## 2024-08-28 DIAGNOSIS — Z85828 Personal history of other malignant neoplasm of skin: Secondary | ICD-10-CM | POA: Diagnosis not present

## 2024-08-28 DIAGNOSIS — D1801 Hemangioma of skin and subcutaneous tissue: Secondary | ICD-10-CM | POA: Diagnosis not present

## 2024-08-28 DIAGNOSIS — L814 Other melanin hyperpigmentation: Secondary | ICD-10-CM | POA: Diagnosis not present

## 2024-08-28 DIAGNOSIS — L821 Other seborrheic keratosis: Secondary | ICD-10-CM | POA: Diagnosis not present

## 2024-08-28 DIAGNOSIS — L280 Lichen simplex chronicus: Secondary | ICD-10-CM | POA: Diagnosis not present

## 2024-08-29 DIAGNOSIS — R2689 Other abnormalities of gait and mobility: Secondary | ICD-10-CM | POA: Diagnosis not present

## 2024-08-29 DIAGNOSIS — R269 Unspecified abnormalities of gait and mobility: Secondary | ICD-10-CM | POA: Diagnosis not present

## 2024-08-29 DIAGNOSIS — Z9181 History of falling: Secondary | ICD-10-CM | POA: Diagnosis not present

## 2024-09-03 NOTE — Telephone Encounter (Signed)
 Received resmed, pt switched to bipap 20/16 cm on 08/29/24. Report is below for review by Dr Buck:

## 2024-09-05 DIAGNOSIS — R2689 Other abnormalities of gait and mobility: Secondary | ICD-10-CM | POA: Diagnosis not present

## 2024-09-05 DIAGNOSIS — R269 Unspecified abnormalities of gait and mobility: Secondary | ICD-10-CM | POA: Diagnosis not present

## 2024-09-05 DIAGNOSIS — Z9181 History of falling: Secondary | ICD-10-CM | POA: Diagnosis not present

## 2024-09-13 ENCOUNTER — Other Ambulatory Visit (HOSPITAL_COMMUNITY): Payer: Self-pay

## 2024-10-03 ENCOUNTER — Other Ambulatory Visit: Payer: Self-pay

## 2024-10-03 ENCOUNTER — Other Ambulatory Visit (HOSPITAL_COMMUNITY): Payer: Self-pay

## 2024-10-06 ENCOUNTER — Other Ambulatory Visit (HOSPITAL_COMMUNITY): Payer: Self-pay

## 2024-10-15 ENCOUNTER — Other Ambulatory Visit (HOSPITAL_COMMUNITY): Payer: Self-pay

## 2024-10-18 ENCOUNTER — Encounter: Payer: Self-pay | Admitting: Adult Health

## 2024-10-22 NOTE — Telephone Encounter (Signed)
 SABRA

## 2024-10-26 ENCOUNTER — Other Ambulatory Visit: Payer: Self-pay

## 2024-10-26 ENCOUNTER — Emergency Department (HOSPITAL_BASED_OUTPATIENT_CLINIC_OR_DEPARTMENT_OTHER)
Admission: EM | Admit: 2024-10-26 | Discharge: 2024-10-26 | Disposition: A | Discharge status: Home / Self Care | Attending: Emergency Medicine | Admitting: Emergency Medicine

## 2024-10-26 ENCOUNTER — Other Ambulatory Visit (HOSPITAL_COMMUNITY): Payer: Self-pay

## 2024-10-26 ENCOUNTER — Encounter (HOSPITAL_BASED_OUTPATIENT_CLINIC_OR_DEPARTMENT_OTHER): Payer: Self-pay | Admitting: Emergency Medicine

## 2024-10-26 DIAGNOSIS — I2510 Atherosclerotic heart disease of native coronary artery without angina pectoris: Secondary | ICD-10-CM | POA: Diagnosis not present

## 2024-10-26 DIAGNOSIS — T148XXA Other injury of unspecified body region, initial encounter: Secondary | ICD-10-CM

## 2024-10-26 DIAGNOSIS — N189 Chronic kidney disease, unspecified: Secondary | ICD-10-CM | POA: Insufficient documentation

## 2024-10-26 DIAGNOSIS — Z79899 Other long term (current) drug therapy: Secondary | ICD-10-CM | POA: Insufficient documentation

## 2024-10-26 DIAGNOSIS — I129 Hypertensive chronic kidney disease with stage 1 through stage 4 chronic kidney disease, or unspecified chronic kidney disease: Secondary | ICD-10-CM | POA: Diagnosis not present

## 2024-10-26 DIAGNOSIS — S50311A Abrasion of right elbow, initial encounter: Secondary | ICD-10-CM | POA: Diagnosis not present

## 2024-10-26 DIAGNOSIS — X58XXXA Exposure to other specified factors, initial encounter: Secondary | ICD-10-CM | POA: Insufficient documentation

## 2024-10-26 DIAGNOSIS — S59901A Unspecified injury of right elbow, initial encounter: Secondary | ICD-10-CM | POA: Diagnosis present

## 2024-10-26 MED ORDER — CEPHALEXIN 500 MG PO CAPS
500.0000 mg | ORAL_CAPSULE | Freq: Four times a day (QID) | ORAL | 0 refills | Status: AC
  Filled 2024-10-26: qty 20, 5d supply, fill #0

## 2024-10-26 NOTE — ED Provider Notes (Signed)
 " [place] EMERGENCY DEPARTMENT AT [facility] Provider Note   CSN: [id number] Arrival date & time: 10/26/24  0009     Patient presents with: Wound Infection   Joseph Maldonado. is a 79 y.o. male.   HPI     This is a 79 year old male who presents with concern for wound infection.  Patient reports that he had a shirt on several days ago when he brushed it up against a knob and got a abrasion/skin tear to the right elbow.  Did not fall directly onto the elbow.  States that he was keeping it bandaged but noted today that was slightly red.  Reports has multiple joint replacements and is concerned about any infections.  Has not had any systemic illness or fever.  They have not been applying any topical antibiotic.  Prior to Admission medications  Medication Sig Start Date End Date Taking? Authorizing Provider  cephALEXin  (KEFLEX ) 500 MG capsule Take 1 capsule (500 mg total) by mouth 4 (four) times daily. 10/26/24  Yes Aster Screws, Charmaine FALCON, MD  acetic acid  2 % otic solution Place 5 drops into the affected ear twice daily. Patient not taking: Reported on 05/08/2024 10/14/23     amoxicillin  (AMOXIL ) 500 MG capsule Take 1 capsule (500 mg total) by mouth 2 (two) times daily. 03/03/24     atenolol  (TENORMIN ) 25 MG tablet Take 1 tablet (25 mg total) by mouth daily. 08/27/24     atorvastatin  (LIPITOR) 20 MG tablet Take 1 tablet (20 mg total) by mouth daily. 06/01/24     buPROPion  ER (WELLBUTRIN  SR) 100 MG 12 hr tablet Take 1 tablet by mouth once daily 03/15/23     buPROPion  ER (WELLBUTRIN  SR) 100 MG 12 hr tablet Take 1 tablet (100 mg total) by mouth 2 (two) times daily. 06/01/24     cholecalciferol (VITAMIN D3) 25 MCG (1000 UNIT) tablet Take 1,000 Units by mouth in the morning.    [provider]  gabapentin  (NEURONTIN ) 100 MG capsule Take 1 capsule (100 mg total) by mouth 3 (three) times daily. For pain Patient not taking: Reported on 05/08/2024 10/22/23   McBane, Caroline N, PA-C  losartan   (COZAAR ) 100 MG tablet Take 1 tablet (100 mg total) by mouth daily. 01/04/24     methocarbamol  (ROBAXIN ) 500 MG tablet Take 1 tablet (500 mg total) by mouth every 8 (eight) hours as needed for muscle spasms. 10/20/23   McBane, Caroline N, PA-C  methylPREDNISolone  (MEDROL ) 4 MG TBPK tablet Take as prescribed on dose pack over 6 days Patient not taking: Reported on 05/08/2024 07/17/23     Multiple Vitamin (MULTIVITAMIN WITH MINERALS) TABS tablet Take 1 tablet by mouth every evening.    [provider]  mupirocin  ointment (BACTROBAN ) 2 % Apply a small amount twice a day intranasally for 14 days. 10/10/23     pantoprazole  (PROTONIX ) 20 MG tablet Take 1 tablet (20 mg total) by mouth daily. 01/04/24     predniSONE  (DELTASONE ) 10 MG tablet Take 4 tablets by mouth once daily for 2 days THEN take 3 tablets once daily for 2 days THEN take 2 tablets daily for 2 days THEN take 1 tablet daily for 2 days Patient not taking: Reported on 05/08/2024 04/20/24     predniSONE  (STERAPRED UNI-PAK 21 TAB) 5 MG (21) TBPK tablet Take 6 tablets (60 mg total) by mouth on day 1, then 5 tablets (50 mg total) on day 2, then 4 tablets (40 mg total) on day 3, then 3  tablets (30 mg total) on day 4, then 2 tablets (20 mg total) on day 5, and then 1 tablet (10 mg total) on day 6. Patient not taking: Reported on 05/08/2024 12/20/23     sertraline  (ZOLOFT ) 50 MG tablet Take 0.5 tablets (25 mg total) by mouth daily for 14 days, THEN 1 tablet (50 mg total) daily . 09/19/23 10/26/23    sertraline  (ZOLOFT ) 50 MG tablet Take 1 tablet (50 mg total) by mouth daily. 01/27/24     sodium bicarbonate  650 MG tablet Take 1 tablet (650 mg total) by mouth 2 (two) times daily. 06/11/24     tizanidine  (ZANAFLEX ) 2 MG capsule Take 2 mg by mouth at bedtime as needed (stiffness/sleep.).    [provider]    Allergies: Nsaids, Dilaudid [hydromorphone hcl], and Sulfa antibiotics    Review of Systems  Constitutional:  Negative for fever.  Skin:   Positive for wound.  All other systems reviewed and are negative.   Updated Vital Signs BP 139/86   Pulse (!) 52   Temp 97.9 F (36.6 C) (Oral)   Resp 17   SpO2 100%   Physical Exam Vitals and nursing note reviewed.  Constitutional:      Appearance: He is well-developed. He is not diaphoretic.  HENT:     Head: Normocephalic and atraumatic.  Cardiovascular:     Rate and Rhythm: Normal rate and regular rhythm.  Pulmonary:     Effort: Pulmonary effort is normal. No respiratory distress.  Abdominal:     Palpations: Abdomen is soft.  Musculoskeletal:        General: Normal range of motion.     Cervical back: Neck supple.  Lymphadenopathy:     Cervical: No cervical adenopathy.  Skin:    General: Skin is warm and dry.     Comments: Abrasion right elbow, no purulence noted, no streaking redness, slight erythema at the wound edge  Neurological:     Mental Status: He is alert and oriented to person, place, and time.  Psychiatric:        Mood and Affect: Mood normal.     (all labs ordered are listed, but only abnormal results are displayed) Labs Reviewed - No data to display  EKG: None  Radiology: No results found.   Procedures   Medications Ordered in the ED - No data to display                                  Medical Decision Making Risk Prescription drug management.   This patient presents to the ED for concern of wound infection, this involves an extensive number of treatment options, and is a complaint that carries with it a high risk of complications and morbidity.  I considered the following differential and admission for this acute, potentially life threatening condition.  The differential diagnosis includes infection, normal healing  MDM:    This is a 79 year old male who presents with concern for wound infection.  He is nontoxic and vital signs are reassuring.  No systemic symptoms.  Slight erythema at the wound edge but no purulence or streaking  redness.  No warmth.  They have not been using any topical antibiotic.  Wound appears dry.  Would recommend starting with topical bacitracin and wound care.  Patient and wife are concerned regarding risk for bacteremia with prior joint replacement.  Discussed with him that this was low risk at  this point.  However I have offered Keflex  for them to start if it is not improving in the next 24 to 48 hours.  He did not fall, low suspicion for underlying fracture.  (Labs, imaging, consults)  Labs: I Ordered, and personally interpreted labs.  The pertinent results include: None  Imaging Studies ordered: I ordered imaging studies including none I independently visualized and interpreted imaging. I agree with the radiologist interpretation  Additional history obtained from chart review.  External records from outside source obtained and reviewed including prior evaluations  Cardiac Monitoring: The patient was not maintained on a cardiac monitor.  If on the cardiac monitor, I personally viewed and interpreted the cardiac monitored which showed an underlying rhythm of: N/A  Reevaluation: After the interventions noted above, I reevaluated the patient and found that they have :improved  Social Determinants of Health:  lives independently  Disposition: Discharge  Co morbidities that complicate the patient evaluation  Past Medical History:  Diagnosis Date   Arthritis    Ascending aortic aneurysm    CAD (coronary artery disease)    Chronic kidney disease    Depression    Dysrhythmia    occasional Palps   GERD (gastroesophageal reflux disease)    Hand pain    currently being evaluated by rheumatology for dx    Hematuria    Hypertension    Rapid heart beat    rpeorts   2 months ago i had a rapid heart beat that lasted 45 min to an hour, ive experience this twice in the last t10 years but only seconds long  i was evaluated by cardiology Dr Wilmer who did a 30 day monitor and ECHO and  said i have occasional [PATS] ? , denies chest pain nor LOC  during these episodes; HR today is WDL    Sleep apnea    CPAP      Medicines Meds ordered this encounter  Medications   cephALEXin  (KEFLEX ) 500 MG capsule    Sig: Take 1 capsule (500 mg total) by mouth 4 (four) times daily.    Dispense:  20 capsule    Refill:  0    I have reviewed the patients home medicines and have made adjustments as needed  Problem List / ED Course: Problem List Items Addressed This Visit   None Visit Diagnoses       Abrasion    -  Primary                Final diagnoses:  Abrasion    ED Discharge Orders          Ordered    cephALEXin  (KEFLEX ) 500 MG capsule  4 times daily        10/26/24 0122               Bari Charmaine FALCON, MD 10/26/24 0128  "

## 2024-10-26 NOTE — Discharge Instructions (Signed)
 You were seen today for concern for wound infection.  Apply bacitracin ointment topically to the wound.  At this time it does not show overt signs of infection but does need to have some good wound care.  If you note increasing redness or purulent drainage, you may start Keflex  and need to be reevaluated by your primary doctor.  Would hold antibiotics for 24 to 48 hours to see if wound improving with just topical antibiotic.

## 2024-10-29 ENCOUNTER — Other Ambulatory Visit (HOSPITAL_COMMUNITY): Payer: Self-pay

## 2024-10-29 MED ORDER — TRAMADOL HCL 50 MG PO TABS
50.0000 mg | ORAL_TABLET | Freq: Four times a day (QID) | ORAL | 0 refills | Status: AC
  Filled 2024-10-29: qty 20, 5d supply, fill #0

## 2024-11-02 ENCOUNTER — Other Ambulatory Visit (HOSPITAL_COMMUNITY): Payer: Self-pay

## 2024-11-02 MED ORDER — DOXYCYCLINE HYCLATE 100 MG PO TABS
100.0000 mg | ORAL_TABLET | Freq: Two times a day (BID) | ORAL | 0 refills | Status: AC
  Filled 2024-11-02: qty 20, 10d supply, fill #0

## 2025-01-08 ENCOUNTER — Ambulatory Visit: Admitting: Adult Health

## 2025-01-09 ENCOUNTER — Ambulatory Visit: Admitting: Adult Health
